# Patient Record
Sex: Male | Born: 1975 | Race: Black or African American | Hispanic: No | Marital: Single | Smoking: Current every day smoker
Health system: Southern US, Community
[De-identification: ages and names within clinical notes are randomized; demographics above are authoritative.]

## PROBLEM LIST (undated history)

## (undated) DIAGNOSIS — F329 Major depressive disorder, single episode, unspecified: Secondary | ICD-10-CM

## (undated) DIAGNOSIS — E669 Obesity, unspecified: Secondary | ICD-10-CM

## (undated) DIAGNOSIS — F209 Schizophrenia, unspecified: Secondary | ICD-10-CM

## (undated) DIAGNOSIS — F32A Depression, unspecified: Secondary | ICD-10-CM

## (undated) DIAGNOSIS — F1010 Alcohol abuse, uncomplicated: Secondary | ICD-10-CM

## (undated) DIAGNOSIS — F1910 Other psychoactive substance abuse, uncomplicated: Secondary | ICD-10-CM

## (undated) DIAGNOSIS — I10 Essential (primary) hypertension: Secondary | ICD-10-CM

---

## 2013-11-17 ENCOUNTER — Emergency Department (HOSPITAL_COMMUNITY)
Admission: EM | Admit: 2013-11-17 | Discharge: 2013-11-18 | Disposition: A | Discharge status: Other Acute Inpatient Hospital | Payer: No Typology Code available for payment source | Attending: Emergency Medicine | Admitting: Emergency Medicine

## 2013-11-17 ENCOUNTER — Encounter (HOSPITAL_COMMUNITY): Payer: Self-pay | Admitting: Emergency Medicine

## 2013-11-17 DIAGNOSIS — F3289 Other specified depressive episodes: Secondary | ICD-10-CM | POA: Insufficient documentation

## 2013-11-17 DIAGNOSIS — R45851 Suicidal ideations: Secondary | ICD-10-CM

## 2013-11-17 DIAGNOSIS — F1010 Alcohol abuse, uncomplicated: Secondary | ICD-10-CM | POA: Insufficient documentation

## 2013-11-17 DIAGNOSIS — F32A Depression, unspecified: Secondary | ICD-10-CM

## 2013-11-17 DIAGNOSIS — Z8659 Personal history of other mental and behavioral disorders: Secondary | ICD-10-CM | POA: Insufficient documentation

## 2013-11-17 DIAGNOSIS — R443 Hallucinations, unspecified: Secondary | ICD-10-CM | POA: Insufficient documentation

## 2013-11-17 DIAGNOSIS — F10929 Alcohol use, unspecified with intoxication, unspecified: Secondary | ICD-10-CM

## 2013-11-17 DIAGNOSIS — F329 Major depressive disorder, single episode, unspecified: Secondary | ICD-10-CM

## 2013-11-17 HISTORY — DX: Depression, unspecified: F32.A

## 2013-11-17 HISTORY — DX: Schizophrenia, unspecified: F20.9

## 2013-11-17 HISTORY — DX: Major depressive disorder, single episode, unspecified: F32.9

## 2013-11-17 LAB — CBC
HCT: 38.6 % — ABNORMAL LOW (ref 39.0–52.0)
Hemoglobin: 13.2 g/dL (ref 13.0–17.0)
MCH: 30 pg (ref 26.0–34.0)
MCHC: 34.2 g/dL (ref 30.0–36.0)
MCV: 87.7 fL (ref 78.0–100.0)
Platelets: 249 K/uL (ref 150–400)
RBC: 4.4 MIL/uL (ref 4.22–5.81)
RDW: 13.9 % (ref 11.5–15.5)
WBC: 8.4 K/uL (ref 4.0–10.5)

## 2013-11-17 LAB — RAPID URINE DRUG SCREEN, HOSP PERFORMED
Amphetamines: NOT DETECTED
Barbiturates: NOT DETECTED
Benzodiazepines: NOT DETECTED
Cocaine: NOT DETECTED
Opiates: NOT DETECTED
Tetrahydrocannabinol: NOT DETECTED

## 2013-11-17 LAB — COMPREHENSIVE METABOLIC PANEL WITH GFR
AST: 23 U/L (ref 0–37)
Albumin: 3.6 g/dL (ref 3.5–5.2)
Alkaline Phosphatase: 71 U/L (ref 39–117)
Chloride: 102 meq/L (ref 96–112)
Total Bilirubin: 0.3 mg/dL (ref 0.3–1.2)

## 2013-11-17 LAB — COMPREHENSIVE METABOLIC PANEL
ALT: 16 U/L (ref 0–53)
BUN: 9 mg/dL (ref 6–23)
CO2: 25 mEq/L (ref 19–32)
Calcium: 9.5 mg/dL (ref 8.4–10.5)
Creatinine, Ser: 0.88 mg/dL (ref 0.50–1.35)
GFR calc Af Amer: >90 mL/min (ref >90)
GFR calc non Af Amer: >90 mL/min (ref >90)
Glucose, Bld: 80 mg/dL (ref 70–99)
Potassium: 4.2 mEq/L (ref 3.7–5.3)
Sodium: 142 mEq/L (ref 137–147)
Total Protein: 7.9 g/dL (ref 6.0–8.3)

## 2013-11-17 LAB — ETHANOL: Alcohol, Ethyl (B): 182 mg/dL — ABNORMAL HIGH (ref 0–11)

## 2013-11-17 MED ORDER — ONDANSETRON HCL 4 MG PO TABS
4.0000 mg | ORAL_TABLET | Freq: Three times a day (TID) | ORAL | Status: DC | PRN
Start: 2013-11-17 — End: 2013-11-18

## 2013-11-17 MED ORDER — IBUPROFEN 400 MG PO TABS: 600.0000 mg | ORAL_TABLET | Freq: Three times a day (TID) | ORAL | Status: DC | PRN

## 2013-11-17 MED ORDER — THIAMINE HCL 100 MG/ML IJ SOLN: 100.0000 mg | Freq: Every day | INTRAMUSCULAR | Status: DC

## 2013-11-17 MED ORDER — LORAZEPAM 1 MG PO TABS: 0.0000 mg | ORAL_TABLET | Freq: Four times a day (QID) | ORAL | Status: DC

## 2013-11-17 MED ORDER — LORAZEPAM 1 MG PO TABS: 0.0000 mg | ORAL_TABLET | Freq: Two times a day (BID) | ORAL | Status: DC

## 2013-11-17 MED ORDER — ACETAMINOPHEN 325 MG PO TABS: 650.0000 mg | ORAL_TABLET | ORAL | Status: DC | PRN

## 2013-11-17 MED ORDER — VITAMIN B-1 100 MG PO TABS: 100.0000 mg | ORAL_TABLET | Freq: Every day | ORAL | Status: DC

## 2013-11-17 NOTE — ED Notes (Signed)
Patient calm, sitter at the bedside.

## 2013-11-17 NOTE — ED Notes (Signed)
Miles CostainFord [facility]

## 2013-11-17 NOTE — BH Assessment (Signed)
Assessment complete. Per Illene SilverBrook, AC at [facility], room [id number] is available. Consulted with Maryjean Mornharles Kober, PA who agreed Pt meets inpatient criteria and accepted Pt to the service of Dr. Jannifer FranklinAkintayo. Notified Dr. Quita SkyeMichael Ghim and Renae FicklePaul, RN of acceptance.  Harlin RainFord Ellis Ria CommentWarrick Jr, LPC, [facility] Triage Specialist

## 2013-11-17 NOTE — Consult Note (Signed)
  Called by TTS pt schizophrenic  And polysubstance abuser (alcohol/cocaine) Off meds 2 yrs-now intoxicated with AH directing self harm.Claims trigger recent deaht of grandmother-bed on [address] available Orders done
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
## 2013-11-17 NOTE — ED Notes (Signed)
[facility]
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
## 2013-11-17 NOTE — ED Notes (Signed)
Patient states Grandmother passed away November 04 2013 and since then patient depressed not eating well, not sleeping well due to "wierd dreams" and is SI no plan denies HI.

## 2013-11-17 NOTE — BH Assessment (Signed)
Received call for assessment. Spoke with Quita SkyeMichael Ghim, MD who said Pt is intoxicated and Pt reports he has schizophrenia. Pt states he has been depressed since grandmother died two weeks ago and has been hearing voices telling him to harm himself. He has been abusing alcohol and drugs. Tele-assessment will be initiated when tele-med cart is available.  Harlin RainFord Ellis Ria CommentWarrick Jr, LPC, [facility] Triage Specialist

## 2013-11-18 ENCOUNTER — Inpatient Hospital Stay (HOSPITAL_COMMUNITY)
Admission: AD | Admit: 2013-11-18 | Discharge: 2013-11-22 | DRG: 885 | Disposition: A | Discharge status: Home / Self Care | Payer: Federal, State, Local not specified - Other | Attending: Psychiatry | Admitting: Psychiatry

## 2013-11-18 ENCOUNTER — Encounter (HOSPITAL_COMMUNITY): Payer: Self-pay

## 2013-11-18 DIAGNOSIS — Z23 Encounter for immunization: Secondary | ICD-10-CM

## 2013-11-18 DIAGNOSIS — F3289 Other specified depressive episodes: Secondary | ICD-10-CM | POA: Diagnosis present

## 2013-11-18 DIAGNOSIS — R45851 Suicidal ideations: Secondary | ICD-10-CM

## 2013-11-18 DIAGNOSIS — F1210 Cannabis abuse, uncomplicated: Secondary | ICD-10-CM | POA: Diagnosis present

## 2013-11-18 DIAGNOSIS — F205 Residual schizophrenia: Principal | ICD-10-CM | POA: Diagnosis present

## 2013-11-18 DIAGNOSIS — F1020 Alcohol dependence, uncomplicated: Secondary | ICD-10-CM | POA: Diagnosis present

## 2013-11-18 DIAGNOSIS — F411 Generalized anxiety disorder: Secondary | ICD-10-CM | POA: Diagnosis present

## 2013-11-18 DIAGNOSIS — F329 Major depressive disorder, single episode, unspecified: Secondary | ICD-10-CM | POA: Diagnosis present

## 2013-11-18 DIAGNOSIS — G4700 Insomnia, unspecified: Secondary | ICD-10-CM | POA: Diagnosis present

## 2013-11-18 MED ORDER — ACETAMINOPHEN 325 MG PO TABS
650.0000 mg | ORAL_TABLET | ORAL | Status: DC | PRN
  Administered 2013-11-20: 650 mg via ORAL
  Filled 2013-11-18: qty 2

## 2013-11-18 MED ORDER — ONDANSETRON 4 MG PO TBDP: 4.0000 mg | ORAL_TABLET | Freq: Four times a day (QID) | ORAL | Status: AC | PRN

## 2013-11-18 MED ORDER — PNEUMOCOCCAL VAC POLYVALENT 25 MCG/0.5ML IJ INJ: 0.5000 mL | INJECTION | INTRAMUSCULAR | Status: DC

## 2013-11-18 MED ORDER — VITAMIN B-1 100 MG PO TABS
100.0000 mg | ORAL_TABLET | Freq: Every day | ORAL | Status: DC
  Administered 2013-11-19 – 2013-11-22 (×4): 100 mg via ORAL
  Filled 2013-11-18 (×6): qty 1

## 2013-11-18 MED ORDER — CHLORDIAZEPOXIDE HCL 25 MG PO CAPS
25.0000 mg | ORAL_CAPSULE | Freq: Four times a day (QID) | ORAL | Status: AC
  Administered 2013-11-18 – 2013-11-19 (×5): 25 mg via ORAL
  Filled 2013-11-18 (×5): qty 1

## 2013-11-18 MED ORDER — THIAMINE HCL 100 MG/ML IJ SOLN: 100.0000 mg | Freq: Once | INTRAMUSCULAR | Status: DC

## 2013-11-18 MED ORDER — CHLORDIAZEPOXIDE HCL 25 MG PO CAPS: 25.0000 mg | ORAL_CAPSULE | Freq: Every day | ORAL | Status: DC

## 2013-11-18 MED ORDER — LOPERAMIDE HCL 2 MG PO CAPS: 2.0000 mg | ORAL_CAPSULE | ORAL | Status: AC | PRN

## 2013-11-18 MED ORDER — IBUPROFEN 200 MG PO TABS
600.0000 mg | ORAL_TABLET | Freq: Three times a day (TID) | ORAL | Status: DC | PRN
  Filled 2013-11-18: qty 3

## 2013-11-18 MED ORDER — MAGNESIUM HYDROXIDE 400 MG/5ML PO SUSP: 30.0000 mL | Freq: Every day | ORAL | Status: DC | PRN

## 2013-11-18 MED ORDER — CHLORDIAZEPOXIDE HCL 25 MG PO CAPS: 25.0000 mg | ORAL_CAPSULE | Freq: Once | ORAL | Status: DC

## 2013-11-18 MED ORDER — ADULT MULTIVITAMIN W/MINERALS CH
1.0000 | ORAL_TABLET | Freq: Every day | ORAL | Status: DC
  Administered 2013-11-18 – 2013-11-22 (×5): 1 via ORAL
  Filled 2013-11-18 (×7): qty 1

## 2013-11-18 MED ORDER — ALUM & MAG HYDROXIDE-SIMETH 200-200-20 MG/5ML PO SUSP: 30.0000 mL | ORAL | Status: DC | PRN

## 2013-11-18 MED ORDER — CHLORDIAZEPOXIDE HCL 25 MG PO CAPS: 25.0000 mg | ORAL_CAPSULE | Freq: Three times a day (TID) | ORAL | Status: DC

## 2013-11-18 MED ORDER — CHLORDIAZEPOXIDE HCL 25 MG PO CAPS: 25.0000 mg | ORAL_CAPSULE | ORAL | Status: DC

## 2013-11-18 MED ORDER — HYDROXYZINE HCL 25 MG PO TABS
25.0000 mg | ORAL_TABLET | Freq: Four times a day (QID) | ORAL | Status: DC | PRN
Start: 2013-11-18 — End: 2013-11-20
  Administered 2013-11-18: 25 mg via ORAL
  Filled 2013-11-18: qty 1

## 2013-11-18 MED ORDER — CHLORDIAZEPOXIDE HCL 25 MG PO CAPS: 25.0000 mg | ORAL_CAPSULE | Freq: Four times a day (QID) | ORAL | Status: AC | PRN

## 2013-11-18 MED ORDER — INFLUENZA VAC SPLIT QUAD 0.5 ML IM SUSP
0.5000 mL | INTRAMUSCULAR | Status: DC
  Filled 2013-11-18: qty 0.5

## 2013-11-18 NOTE — [facility] Group Notes (Signed)
[facility] LCSW Group Therapy  11/18/2013 1:35 PM  Type of Therapy:  Group Therapy  Participation Level:  None  Participation Quality:  Drowsy  Affect:  Lethargic  Cognitive:  Lacking  Insight:  None  Engagement in Therapy:  None  Modes of Intervention:  Confrontation, Discussion, Education, Exploration, Problem-solving, Rapport Building, Socialization and Support  Summary of Progress/Problems: Today's group topic was avoiding self sabotage and enabling behaviors. Group members were asked to define self sabotage and enabling and provide examples. Group members were then asked to discuss unhealthy relationships and how to have positive healthy boundaries with those that enable. Group members were asked to process how communicating needs and establishing a plan to change the above identified behavior. Bruce [name] came to group about thirty minutes late. He sat quietly in his seat but was inattentive and disengaged throughout group. He did not actively participate in group discussion and does not appear to be making progress in the group setting at this time.    Smart, [name] LCSWA  11/18/2013, 1:35 PM

## 2013-11-18 NOTE — [facility] Suicide Risk Assessment (Signed)
Suicide Risk Assessment  Admission Assessment     Nursing information obtained from:  Patient Demographic factors:  Male;Low socioeconomic status Current Mental Status:  Suicidal ideation indicated by patient Loss Factors:  Loss of significant relationship;Financial problems / change in socioeconomic status Historical Factors:  Prior suicide attempts;Family history of mental illness or substance abuse Risk Reduction Factors:  Living with another person, especially a relative;Positive social support Total Time spent with patient: 45 minutes  CLINICAL FACTORS:   Schizophrenia:   Command hallucinatons Depressive state Less than 919 years old Paranoid or undifferentiated type  Psychiatric Specialty Exam: Please see complete mental status examination is done by this writer on history and physical.  COGNITIVE FEATURES THAT CONTRIBUTE TO RISK:  Closed-mindedness Loss of executive function Polarized thinking Thought constriction (tunnel vision)    SUICIDE RISK:   Severe:  Frequent, intense, and enduring suicidal ideation, specific plan, no subjective intent, but some objective markers of intent (i.e., choice of lethal method), the method is accessible, some limited preparatory behavior, evidence of impaired self-control, severe dysphoria/symptomatology, multiple risk factors present, and few if any protective factors, particularly a lack of social support.  PLAN OF CARE:  I certify that inpatient services furnished can reasonably be expected to improve the patient's condition.  ARFEEN,SYED T. 11/18/2013, 10:50 AM

## 2013-11-18 NOTE — Progress Notes (Signed)
NUTRITION ASSESSMENT  Consult -"obese, metabolic syndrome, mental illness"  INTERVENTION: 1. Educated patient on the importance of nutrition and encouraged intake of food and beverages.  Educated patient on healthy eating for weight loss.  Encouraged him to stay active.  Patient with no questions. 2. Discussed weight goals. 3. Supplements: MVI and thiamine   NUTRITION DIAGNOSIS: Unintentional weight loss related to sub-optimal intake as evidenced by pt report.   Goal: Pt to meet >/= 90% of their estimated nutrition needs.  Monitor:  PO intake  Assessment:  Patient admitted with depression and auditory hallucinations- probably untreated schizophrenia.  Hx of ETOH, THC, and Cocaine.    Patient states that he drinks up to a 12 pack of beer every day.  Otherwise drinks water.  Was not open with specific details of his diet.  States that he has always struggled with his weight but it increases and decreases.  Loses weight when he works more.  Job is very active.  38 y.o. male  Height: Ht Readings from Last 1 Encounters:  11/18/13 5\' 6"  (1.676 m)    Weight: Wt Readings from Last 1 Encounters:  11/18/13 380 lb (172.367 kg)    Weight Hx: Wt Readings from Last 10 Encounters:  11/18/13 380 lb (172.367 kg)  11/17/13 350 lb (158.759 kg)    BMI:  Body mass index is 61.36 kg/(m^2). Pt meets criteria for extreme obesity based on current BMI.  Estimated Nutritional Needs: Kcal: 25-30 kcal/kg Protein: > 1 gram protein/kg Fluid: 1 ml/kcal  Diet Order: General Pt is also offered choice of unit snacks mid-morning and mid-afternoon.  Pt is eating as desired.   Lab results and medications reviewed.   Oran ReinLaura Jobe, RD, LDN Clinical Inpatient Dietitian Pager:  [phone] Weekend and after hours pager:  [phone]

## 2013-11-18 NOTE — Progress Notes (Signed)
Patient ID: Bruce [name], male   DOB: [birth year][name], 38 y.o.   MRN: [id number] Pt is a 38 yr old male that came in for increased depression and auditory hallucinations. Pt stated he hears a mans voice telling him to hurt himself. Pt stated it started shortly after his grandmother passed which was approximately two week ago. Pt currently lives with a friend and works with a Customer service managertemporary service. Pt stated he drinks 3 40oz daily or a half case of beer. Pt has hx of THC and crack cocaine use. Pt stated his last use of THC was about two months ago. Pt stated he is having passive si, but contracts for safety. Pt has had previous SI attempts, with cutting self and attempting to jump off of bridge. Pt stated his uncle suffers from schizophrenia and bipolar, so he is able to talk to him because he feels as though he understands. Pt stated his support system consist of his mother and uncle. Pt is calm and cooperative. Speaks softly, brief eye contact. Denies hi and pain. Pt introduced to unit. Meal given.pt remains safe on unit.

## 2013-11-18 NOTE — [facility] Group Notes (Signed)
[facility] Group Notes:  (Nursing/MHT/Case Management/Adjunct)  Date:  11/18/2013  Time:  0910  Type of Therapy:  Psychoeducational Skills--self inventory review  Participation Level:  Minimal  Participation Quality:  Resistant  Affect:  Irritable  Cognitive:  Lacking  Insight:  Lacking  Engagement in Group:  Limited  Modes of Intervention:  Discussion, Education and Exploration  Summary of Progress/Problems: Self inventory review with RN.  Malva LimesStrader, Alexander Aument 11/18/2013, 1:01 PM

## 2013-11-18 NOTE — Progress Notes (Signed)
Patient ID: Bruce OrganDwight [name], male   DOB: 1975/10/19, 38 y.o.   MRN: [id number] D. Patient presents with depressed mood, affect congruent. Bruce [name] reports that his mood is improving and he denies any SI/HI. He has denied any withdrawal symptoms. Po fluids encouraged and gatorade provided. Bruce [name] has been interactive on the unit and attending unit programming. He denies any acute concerns. A. Support and encouragement provided . Medications given as ordered. R. Patient denies any acute concerns at this time. Will continue to monitor q 15 minutes for safety.

## 2013-11-18 NOTE — Tx Team (Signed)
Initial Interdisciplinary Treatment Plan  PATIENT STRENGTHS: (choose at least two) Ability for insight Communication skills Motivation for treatment/growth Supportive family/friends  PATIENT STRESSORS: Financial difficulties Medication change or noncompliance Substance abuse   PROBLEM LIST: Problem List/Patient Goals Date to be addressed Date deferred Reason deferred Estimated date of resolution  depression 11/18/13     etoh and drug use 11/18/13     auditory hallucination  11/18/13     Poor coping skills 11/18/13                                    DISCHARGE CRITERIA:  Ability to meet basic life and health needs Adequate post-discharge living arrangements Improved stabilization in mood, thinking, and/or behavior Motivation to continue treatment in a less acute level of care  PRELIMINARY DISCHARGE PLAN: Attend aftercare/continuing care group Attend PHP/IOP Outpatient therapy Return to previous living arrangement  PATIENT/FAMIILY INVOLVEMENT: This treatment plan has been presented to and reviewed with the patient, Bruce [name].  The patient and family have been given the opportunity to ask questions and make suggestions.  Heriberto Antiguaerry, Trew Sunde M 11/18/2013, 1:43 AM
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
## 2013-11-18 NOTE — [facility] Group Notes (Signed)
[facility] Group Notes:  (Nursing/MHT/Case Management/Adjunct)  Date:  11/18/2013  Time:  0910  Type of Therapy:  Psychoeducational Skills--Healthy Support Systems  Participation Level:  Active  Participation Quality:  Appropriate  Affect:   Depressed  Cognitive:  Appropriate  Insight:  Improving  Engagement in Group:  Engaged  Modes of Intervention:  Discussion, Education and Exploration  Summary of Progress/Problems: Psychoeducational review with a focus on healthy support systems. During this group we discussed unhealthy support and triggers, and to replace with healthier ones for mental health recovery.  Malva LimesStrader, Ascencion Stegner 11/18/2013, 1:34 PM

## 2013-11-19 MED ORDER — RISPERIDONE 1 MG PO TABS
1.0000 mg | ORAL_TABLET | Freq: Every day | ORAL | Status: DC
  Administered 2013-11-19 – 2013-11-21 (×3): 1 mg via ORAL
  Filled 2013-11-19: qty 14
  Filled 2013-11-19 (×4): qty 1

## 2013-11-19 MED ORDER — FLUOXETINE HCL 20 MG PO CAPS
20.0000 mg | ORAL_CAPSULE | Freq: Every day | ORAL | Status: DC
  Administered 2013-11-19 – 2013-11-22 (×4): 20 mg via ORAL
  Filled 2013-11-19 (×2): qty 1
  Filled 2013-11-19: qty 14
  Filled 2013-11-19 (×4): qty 1

## 2013-11-19 NOTE — Progress Notes (Signed)
The focus of this group is to help patients review their daily goal of treatment and discuss progress on daily workbooks. Pt attended the evening group session and responded to all discussion prompts from the Writer. Pt shared that today was a good day on the unit, the highlight of which was having a good conversation with his Mom on the phone, whom he considers supportive. Pt stated that he was feeling less stressed today than when he came in. On the topic of goals, Pt stated that he hoped to be well enough to return to work this week. Pt's affect was appropriate.

## 2013-11-19 NOTE — Progress Notes (Signed)
Adult Psychoeducational Group Note  Date:  11/19/2013 Time:  10:00 am  Group Topic/Focus:  Wellness Toolbox:   The focus of this group is to discuss various aspects of wellness, balancing those aspects and exploring ways to increase the ability to experience wellness.  Patients will create a wellness toolbox for use upon discharge.  Participation Level:  Active  Participation Quality:  Appropriate and Sharing  Affect:  Appropriate  Cognitive:  Appropriate  Insight: Appropriate  Engagement in Group:  Engaged  Modes of Intervention:  Discussion, Education, Socialization and Support  Additional Comments:  Pt stated that by finding permanent work and staying busy that he can promote wellness. Pt stated that his criminal record along with alcohol are barriers to his progress.    Renise Gillies 11/19/2013, 10:54 AM

## 2013-11-19 NOTE — [facility] Group Notes (Signed)
[facility]   11/19/2013 10:27 AM  Participation Quality:  Engaged  Mood/Affect:  Depressed  Depression Rating:  unrated  Anxiety Rating:  unrated  Thoughts of Suicide:  No Will you contract for safety?   NA  Current AVH:  No  Plan for Discharge/Comments:  [name] states a friend suggested he come in as he had been increasingly depressed and increased his drinking following his grandmother's death earlier this month.  He lives with same friend.  He is temporarily employed with the furniture market. His supports are his uncle and his mother.  He wonders how long he will have to be here.  Transportation Means:  family  Supports: family   Kiribatiorth, [name] B

## 2013-11-19 NOTE — [facility] Group Notes (Signed)
[facility] LCSW Group Therapy  11/19/2013 1:15 pm  Type of Therapy: Process Group Therapy  Participation Level:  Active  Participation Quality:  Minimal  Affect:  Drowsey  Cognitive:  Oriented  Insight:  Improving  Engagement in Group:  Limited  Engagement in Therapy:  Limited  Modes of Intervention:  Activity, Clarification, Education, Problem-solving and Support  Summary of Progress/Problems: Today's group addressed the issue of overcoming obstacles.  Patients were asked to identify their biggest obstacle post d/c that stands in the way of their on-going success, and then problem solve as to how to manage this.   I awoke Bruce [name] to ask him about stressors.  He stated he needs to cut back on his drinking when he leaves us.  Did not have a plan for this other than trying to pick up more hours at work.  Talked about an uncle who is also struggling with alcohol who ended up in the hospital recently.  He believes the 2 of them can be supportive to each other.  Bruce [name], Bruce [name] 11/19/2013   3:47 PM

## 2013-11-19 NOTE — Progress Notes (Signed)
[facility] MD Progress Note  11/19/2013 11:09 AM Bruce [name]  MRN:  [id number] Subjective:   Patient states "I got real depressed after my grandmother died. I started to hear voices again. It has happened before. Then I started to drink daily to cope with my symptoms. I have not been taking any medications lately."  Objective:  Patient is visible on the unit and attending the scheduled groups. He reports decreased symptoms of depression and psychosis since being admitted to the hospital. So far patient has only been prescribed the librium detox protocol. Nursing staff report that the patient has been minimizing his symptoms since admission. Prior to admission the patient was experiencing depression, command hallucinations, suicidal thoughts, and paranoia. He admits to being severely depressed about the recent death of his grandmother on 11-29-13. 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 The patient admits to a history of schizophrenia but denies being on antipsychotic medications in the past. He is reporting minimal withdrawal symptoms reporting that he was drinking heavily for the past two weeks. Patient is open to being started on medications to treat his symptoms.   Diagnosis:   DSM5: Total Time spent with patient: 30 minutes  Schizophrenia Disorders: Schizophrenia (295.7)  Obsessive-Compulsive Disorders: None  Trauma-Stressor Disorders: None  Substance/Addictive Disorders: History of using crack cocaine and alcohol.  AXIS I: Paranoid schizophrenia  AXIS II: Deferred  AXIS III:  Past Medical History   Diagnosis  Date   .  Depression    .  Schizophrenia     AXIS IV: other psychosocial or environmental problems, problems related to social environment and problems with primary support group  AXIS V: 41-50 Serious Symptoms   ADL's:  Intact  Sleep: Fair  Appetite:  Good  Suicidal Ideation:  Passive SI no plan  Homicidal  Ideation:  Denies AEB (as evidenced by):  Psychiatric Specialty Exam: Physical Exam  Review of Systems  Constitutional: Negative.   HENT: Negative.   Eyes: Negative.   Respiratory: Negative.   Cardiovascular: Negative.   Gastrointestinal: Negative.   Genitourinary: Negative.   Musculoskeletal: Negative.   Skin: Negative.   Neurological: Negative.   Endo/Heme/Allergies: Negative.   Psychiatric/Behavioral: Positive for depression, suicidal ideas, hallucinations and substance abuse. Negative for memory loss. The patient is nervous/anxious and has insomnia.     Blood pressure 157/86, pulse 83, temperature 97.4 F (36.3 C), temperature source Oral, resp. rate 16, height '5\' 6"'  (1.676 m), weight 172.367 kg (380 lb).Body mass index is 61.36 kg/(m^2).  General Appearance: Casual  Eye Contact::  Fair  Speech:  Clear and Coherent and Slow  Volume:  Normal  Mood:  Dysphoric  Affect:  Blunt  Thought Process:  Circumstantial  Orientation:  Full (Time, Place, and Person)  Thought Content:  Hallucinations: Auditory and Paranoid Ideation  Suicidal Thoughts:  Yes.  without intent/plan  Homicidal Thoughts:  No  Memory:  Immediate;   Good Recent;   Fair Remote;   Fair  Judgement:  Impaired  Insight:  Lacking  Psychomotor Activity:  Decreased  Concentration:  Fair  Recall:  Independence: Fair  Akathisia:  No  Handed:  Right  AIMS (if indicated):     Assets:  Communication Skills Desire for Improvement Leisure Time Physical Health Resilience Social Support  Sleep:  Number of Hours: 6.5   Musculoskeletal: Strength & Muscle Tone: within normal limits Gait & Station: normal Patient leans: N/A  Current  Medications: Current Facility-Administered Medications  Medication Dose Route Frequency Provider Last Rate Last Dose  . acetaminophen (TYLENOL) tablet 650 mg  650 mg Oral Q4H PRN Dara Hoyer, PA-C      . alum & mag hydroxide-simeth (MAALOX/MYLANTA)  200-200-20 MG/5ML suspension 30 mL  30 mL Oral Q4H PRN Dara Hoyer, PA-C      . chlordiazePOXIDE (LIBRIUM) capsule 25 mg  25 mg Oral Q6H PRN Dara Hoyer, PA-C      . chlordiazePOXIDE (LIBRIUM) capsule 25 mg  25 mg Oral Once Dara Hoyer, PA-C      . chlordiazePOXIDE (LIBRIUM) capsule 25 mg  25 mg Oral QID Dara Hoyer, PA-C   25 mg at 11/19/13 3818   Followed by  . chlordiazePOXIDE (LIBRIUM) capsule 25 mg  25 mg Oral TID Dara Hoyer, PA-C       Followed by  . [START ON 11/20/2013] chlordiazePOXIDE (LIBRIUM) capsule 25 mg  25 mg Oral BH-qamhs Dara Hoyer, PA-C       Followed by  . [START ON 11/22/2013] chlordiazePOXIDE (LIBRIUM) capsule 25 mg  25 mg Oral Daily Dara Hoyer, PA-C      . FLUoxetine (PROZAC) capsule 20 mg  20 mg Oral Daily Elmarie Shiley, NP      . hydrOXYzine (ATARAX/VISTARIL) tablet 25 mg  25 mg Oral Q6H PRN Dara Hoyer, PA-C   25 mg at 11/18/13 0813  . ibuprofen (ADVIL,MOTRIN) tablet 600 mg  600 mg Oral Q8H PRN Dara Hoyer, PA-C      . influenza vac split quadrivalent PF (FLUARIX) injection 0.5 mL  0.5 mL Intramuscular Tomorrow-1000 Gerasimos Plotts      . loperamide (IMODIUM) capsule 2-4 mg  2-4 mg Oral PRN Dara Hoyer, PA-C      . magnesium hydroxide (MILK OF MAGNESIA) suspension 30 mL  30 mL Oral Daily PRN Dara Hoyer, PA-C      . multivitamin with minerals tablet 1 tablet  1 tablet Oral Daily Dara Hoyer, PA-C   1 tablet at 11/19/13 0803  . ondansetron (ZOFRAN-ODT) disintegrating tablet 4 mg  4 mg Oral Q6H PRN Dara Hoyer, PA-C      . pneumococcal 23 valent vaccine (PNU-IMMUNE) injection 0.5 mL  0.5 mL Intramuscular Tomorrow-1000 Laquashia Mergenthaler      . risperiDONE (RISPERDAL) tablet 1 mg  1 mg Oral QHS Elmarie Shiley, NP      . thiamine (B-1) injection 100 mg  100 mg Intramuscular Once Dara Hoyer, PA-C      . thiamine (VITAMIN B-1) tablet 100 mg  100 mg Oral Daily Dara Hoyer, PA-C   100 mg at 11/19/13 0803    Lab Results:   Results for orders placed during the hospital encounter of 11/17/13 (from the past 48 hour(s))  COMPREHENSIVE METABOLIC PANEL     Status: None   Collection Time    11/17/13  7:43 PM      Result Value Ref Range   Sodium 142  137 - 147 mEq/L   Potassium 4.2  3.7 - 5.3 mEq/L   Chloride 102  96 - 112 mEq/L   CO2 25  19 - 32 mEq/L   Glucose, Bld 80  70 - 99 mg/dL   BUN 9  6 - 23 mg/dL   Creatinine, Ser 0.88  0.50 - 1.35 mg/dL   Calcium 9.5  8.4 - 10.5 mg/dL   Total Protein 7.9  6.0 - 8.3 g/dL  Albumin 3.6  3.5 - 5.2 g/dL   AST 23  0 - 37 U/L   ALT 16  0 - 53 U/L   Alkaline Phosphatase 71  39 - 117 U/L   Total Bilirubin 0.3  0.3 - 1.2 mg/dL   GFR calc non Af Amer >90  >90 mL/min   GFR calc Af Amer >90  >90 mL/min   Comment: (NOTE)     The eGFR has been calculated using the CKD EPI equation.     This calculation has not been validated in all clinical situations.     eGFR's persistently <90 mL/min signify possible Chronic Kidney     Disease.  ETHANOL     Status: Abnormal   Collection Time    11/17/13  7:43 PM      Result Value Ref Range   Alcohol, Ethyl (B) 182 (*) 0 - 11 mg/dL   Comment:            LOWEST DETECTABLE LIMIT FOR     SERUM ALCOHOL IS 11 mg/dL     FOR MEDICAL PURPOSES ONLY  CBC     Status: Abnormal   Collection Time    11/17/13  7:43 PM      Result Value Ref Range   WBC 8.4  4.0 - 10.5 K/uL   RBC 4.40  4.22 - 5.81 MIL/uL   Hemoglobin 13.2  13.0 - 17.0 g/dL   HCT 38.6 (*) 39.0 - 52.0 %   MCV 87.7  78.0 - 100.0 fL   MCH 30.0  26.0 - 34.0 pg   MCHC 34.2  30.0 - 36.0 g/dL   RDW 13.9  11.5 - 15.5 %   Platelets 249  150 - 400 K/uL  URINE RAPID DRUG SCREEN (HOSP PERFORMED)     Status: None   Collection Time    11/17/13  8:29 PM      Result Value Ref Range   Opiates NONE DETECTED  NONE DETECTED   Cocaine NONE DETECTED  NONE DETECTED   Benzodiazepines NONE DETECTED  NONE DETECTED   Amphetamines NONE DETECTED  NONE DETECTED   Tetrahydrocannabinol NONE DETECTED   NONE DETECTED   Barbiturates NONE DETECTED  NONE DETECTED   Comment:            DRUG SCREEN FOR MEDICAL PURPOSES     ONLY.  IF CONFIRMATION IS NEEDED     FOR ANY PURPOSE, NOTIFY LAB     WITHIN 5 DAYS.                LOWEST DETECTABLE LIMITS     FOR URINE DRUG SCREEN     Drug Class       Cutoff (ng/mL)     Amphetamine      1000     Barbiturate      200     Benzodiazepine   465     Tricyclics       035     Opiates          300     Cocaine          300     THC              50    Physical Findings: AIMS:  , ,  ,  ,    CIWA:  CIWA-Ar Total: 0 COWS:     Treatment Plan Summary: Daily contact with patient to assess and evaluate symptoms and progress in treatment Medication management  Plan: 1. Continue crisis management and stabilization.  2. Medication management: Continue Librium protocol for alcohol detox. Start Prozac 20 mg daily for depression and Risperdal 1 mg hs for psychosis.  3. Encouraged patient to attend groups and participate in group counseling sessions and activities.  4. Discharge plan in progress.  5. Continue current treatment plan.  6. Address health issues: Monitor blood pressure for continued elevations.   Medical Decision Making Problem Points:  Established problem, stable/improving (1), Review of last therapy session (1) and Review of psycho-social stressors (1) Data Points:  Review of medication regiment & side effects (2) Review of new medications or change in dosage (2)  I certify that inpatient services furnished can reasonably be expected to improve the patient's condition.   Elmarie Shiley NP-C 11/19/2013, 11:09 AM Patient seen, evaluated and I agree with notes by Nurse Practitioner. Corena Pilgrim, MD

## 2013-11-19 NOTE — Progress Notes (Signed)
D: Pt presents with flat affect and depressed mood. Pt appears to be minimizing his symptoms this morning. Pt denies feeling SI/HI/AVH. Pt denies having any depression today. Pt denies having any withdrawal symptoms and verbalized that the librium is causing him to feel drowsy. Pt have not been prescribed any antidepressants nor antipsychotics however, pt reports that he is feeling better today. A: Medications administered as ordered per MD. Verbal support given. Pt encouraged to attend groups. 15 minute checks performed for safety. R: Pt safety maintained at this time.

## 2013-11-19 NOTE — Progress Notes (Signed)
[facility] Group Notes:  (Nursing/MHT/Case Management/Adjunct)  Date:  11/18/2013 Time:  8:00 p.m.   Type of Therapy:  Psychoeducational Skills  Participation Level:  Minimal  Participation Quality:  Resistant  Affect:  Depressed  Cognitive:  Lacking  Insight:  Lacking  Engagement in Group:  Resistant  Modes of Intervention:  Education  Summary of Progress/Problems: The patient had very little to share in group this evening. He stated that he had a good day and that he attended groups. The patient would not explain any further. The patient's support system (theme of the day) will consist of his mother and uncle.   Reve Crocket S 11/19/2013, 1:52 AM

## 2013-11-20 DIAGNOSIS — R45851 Suicidal ideations: Secondary | ICD-10-CM

## 2013-11-20 DIAGNOSIS — F200 Paranoid schizophrenia: Secondary | ICD-10-CM

## 2013-11-20 DIAGNOSIS — F1020 Alcohol dependence, uncomplicated: Secondary | ICD-10-CM | POA: Diagnosis present

## 2013-11-20 MED ORDER — OLANZAPINE 10 MG PO TBDP: 10.0000 mg | ORAL_TABLET | Freq: Three times a day (TID) | ORAL | Status: DC | PRN

## 2013-11-20 MED ORDER — TRAZODONE HCL 100 MG PO TABS
100.0000 mg | ORAL_TABLET | Freq: Every evening | ORAL | Status: DC | PRN
  Administered 2013-11-20: 100 mg via ORAL
  Filled 2013-11-20: qty 14

## 2013-11-20 NOTE — Progress Notes (Signed)
D: Pt denies SI/HI/AVH. Pt is pleasant and cooperative. Pt stated he was ready to get back to work  A: Pt was offered support and encouragement. Pt was given scheduled medications. Pt was encourage to attend groups. Q 15 minute checks were done for safety.   R:Pt attends groups and interacts well with peers and staff. Pt is taking medication. Pt has no complaints at this time .Pt receptive to treatment and safety maintained on unit. 

## 2013-11-20 NOTE — Tx Team (Signed)
  Interdisciplinary Treatment Plan Update   Date Reviewed:  11/20/2013  Time Reviewed:  8:31 AM  Progress in Treatment:   Attending groups: Yes Participating in groups: Yes Taking medication as prescribed: Yes  Tolerating medication: Yes Family/Significant other contact made: Yes  Patient understands diagnosis: Yes  Discussing patient identified problems/goals with staff: Yes Medical problems stabilized or resolved: Yes Denies suicidal/homicidal ideation: Yes Patient has not harmed self or others: Yes  For review of initial/current patient goals, please see plan of care.  Estimated Length of Stay:  3-5 days  Reason for Continuation of Hospitalization: Depression Hallucinations Medication stabilization  New Problems/Goals identified:  N/A  Discharge Plan or Barriers:   return home, follow up outpt  Additional Comments:  "I got real depressed after my grandmother died. I started to hear voices again. It has happened before. Then I started to drink daily to cope with my symptoms. I have not been taking any medications lately."   Attendees:  Signature: Thedore MinsMojeed Akintayo, MD 11/20/2013 8:31 AM   Signature: Richelle Itood Felecia Stanfill, LCSW 11/20/2013 8:31 AM  Signature: Fransisca KaufmannLaura Davis, NP 11/20/2013 8:31 AM  Signature: Joslyn Devonaroline Beaudry, RN 11/20/2013 8:31 AM  Signature: Liborio NixonPatrice White, RN 11/20/2013 8:31 AM  Signature:  11/20/2013 8:31 AM  Signature:   11/20/2013 8:31 AM  Signature:    Signature:    Signature:    Signature:    Signature:    Signature:      Scribe for Treatment Team:   Richelle Itood Rufina Kimery, LCSW  11/20/2013 8:31 AM

## 2013-11-20 NOTE — [facility] Group Notes (Signed)
[facility] LCSW Group Therapy  11/20/2013 , 1:28 PM   Type of Therapy:  Group Therapy  Participation Level:  Stated he had a headache and could not Attend    Summary of Progress/Problems: Today's group focused on the term Diagnosis.  Participants were asked to define the term, and then pronounce whether it is a negative, positive or neutral term.  Daryel Geraldorth, Kaidence Callaway B 11/20/2013 , 1:28 PM

## 2013-11-20 NOTE — Progress Notes (Signed)
Patient ID: Bruce [name], male   DOB: 06-Dec-1975, 38 y.o.   MRN: [id number] [facility] MD Progress Note  11/20/2013 11:22 AM Bruce [name]  MRN:  [id number] Subjective:   Patient states "I am still feeling depressed and hears voices.'' Objective:  Patient seen and chart is reviewed. Patient continue to endorse feeling anxious and depressed. He endorse recent stressor because his grandmother died over two weeks ago and it was a funeral which led to increased hallucination. He is reporting ongoing crying spells, poor concentration and difficulty sleeping. He has been feeling  Hopeless, helpless and worthless. He also feels that people are against him and they are going to hurt him. He is withdrawn into his room with minimal interaction with his peers. He has not been attending the unit milieu but he is compliant with his medications.  Diagnosis:   DSM5: Total Time spent with patient: 20 minutes  Schizophrenia Disorders: Schizophrenia (295.7)  Obsessive-Compulsive Disorders: None  Trauma-Stressor Disorders: None  Substance/Addictive Disorders: History of using crack cocaine and alcohol.  AXIS I: Paranoid schizophrenia  AXIS II: Deferred  AXIS III:  Past Medical History   Diagnosis  Date   .  Depression    .  Schizophrenia     AXIS IV: other psychosocial or environmental problems, problems related to social environment and problems with primary support group  AXIS V: 41-50 Serious Symptoms   ADL's:  Intact  Sleep: Fair  Appetite:  Good  Suicidal Ideation:  Passive SI no plan  Homicidal Ideation:  Denies AEB (as evidenced by):  Psychiatric Specialty Exam: Physical Exam  Review of Systems  Constitutional: Negative.   HENT: Negative.   Eyes: Negative.   Respiratory: Negative.   Cardiovascular: Negative.   Gastrointestinal: Negative.   Genitourinary: Negative.   Musculoskeletal: Negative.   Skin: Negative.   Neurological: Negative.   Endo/Heme/Allergies: Negative.    Psychiatric/Behavioral: Positive for depression, suicidal ideas, hallucinations and substance abuse. Negative for memory loss. The patient is nervous/anxious and has insomnia.     Blood pressure 123/83, pulse 81, temperature 97.7 F (36.5 C), temperature source Oral, resp. rate 20, height 5\' 6"  (1.676 m), weight 172.367 kg (380 lb).Body mass index is 61.36 kg/(m^2).  General Appearance: Casual  Eye Contact::  Fair  Speech:  Clear and Coherent and Slow  Volume:  Normal  Mood:  Dysphoric  Affect:  Blunt  Thought Process:  Circumstantial  Orientation:  Full (Time, Place, and Person)  Thought Content:  Hallucinations: Auditory and Paranoid Ideation  Suicidal Thoughts:  Yes.  without intent/plan  Homicidal Thoughts:  No  Memory:  Immediate;   Good Recent;   Fair Remote;   Fair  Judgement:  Impaired  Insight:  Lacking  Psychomotor Activity:  Decreased  Concentration:  Fair  Recall:  [employer] of Knowledge:Fair  Language: Fair  Akathisia:  No  Handed:  Right  AIMS (if indicated):     Assets:  Communication Skills Desire for Improvement Leisure Time Physical Health Resilience Social Support  Sleep:  Number of Hours: 5.75   Musculoskeletal: Strength & Muscle Tone: within normal limits Gait & Station: normal Patient leans: N/A  Current Medications: Current Facility-Administered Medications  Medication Dose Route Frequency Provider Last Rate Last Dose  . acetaminophen (TYLENOL) tablet 650 mg  650 mg Oral Q4H PRN Court Joy, PA-C   650 mg at 11/20/13 5284  . alum & mag hydroxide-simeth (MAALOX/MYLANTA) 200-200-20 MG/5ML suspension 30 mL  30 mL Oral Q4H PRN Court Joy,  PA-C      . chlordiazePOXIDE (LIBRIUM) capsule 25 mg  25 mg Oral Q6H PRN Court Joyharles E Kober, PA-C      . FLUoxetine (PROZAC) capsule 20 mg  20 mg Oral Daily Fransisca KaufmannLaura Davis, NP   20 mg at 11/20/13 0735  . hydrOXYzine (ATARAX/VISTARIL) tablet 25 mg  25 mg Oral Q6H PRN Court Joyharles E Kober, PA-C   25 mg at 11/18/13  0813  . ibuprofen (ADVIL,MOTRIN) tablet 600 mg  600 mg Oral Q8H PRN Court Joyharles E Kober, PA-C      . influenza vac split quadrivalent PF (FLUARIX) injection 0.5 mL  0.5 mL Intramuscular Tomorrow-1000 Omelia Marquart      . loperamide (IMODIUM) capsule 2-4 mg  2-4 mg Oral PRN Court Joyharles E Kober, PA-C      . magnesium hydroxide (MILK OF MAGNESIA) suspension 30 mL  30 mL Oral Daily PRN Court Joyharles E Kober, PA-C      . multivitamin with minerals tablet 1 tablet  1 tablet Oral Daily Court Joyharles E Kober, PA-C   1 tablet at 11/20/13 [id number]  . ondansetron (ZOFRAN-ODT) disintegrating tablet 4 mg  4 mg Oral Q6H PRN Court Joyharles E Kober, PA-C      . pneumococcal 23 valent vaccine (PNU-IMMUNE) injection 0.5 mL  0.5 mL Intramuscular Tomorrow-1000 Yilia Sacca      . risperiDONE (RISPERDAL) tablet 1 mg  1 mg Oral QHS Fransisca KaufmannLaura Davis, NP   1 mg at 11/19/13 2225  . thiamine (B-1) injection 100 mg  100 mg Intramuscular Once Court Joyharles E Kober, PA-C      . thiamine (VITAMIN B-1) tablet 100 mg  100 mg Oral Daily Court Joyharles E Kober, PA-C   100 mg at 11/20/13 [id number]    Lab Results:  No results found for this or any previous visit (from the past 48 hour(s)).  Physical Findings: AIMS:  , ,  ,  ,    CIWA:  CIWA-Ar Total: 0 COWS:     Treatment Plan Summary: Daily contact with patient to assess and evaluate symptoms and progress in treatment Medication management  Plan: 1. Continue crisis management and stabilization.  2. Medication management: Continue Librium protocol for alcohol detox. -Continue Prozac 20 mg daily for depression. -Contnue Risperdal 1 mg hs for psychosis.  -Initiate Trazodone 100mg  qhs prn insomnia 3. Encouraged patient to attend groups and participate in group counseling sessions and activities.  4. Discharge plan in progress.  5. Continue current treatment plan.  6. Address health issues: Monitor blood pressure for continued elevations.   Medical Decision Making Problem Points:  Established problem, improving (1),  Review of last therapy session (1) and Review of psycho-social stressors (1) Data Points:  Review of medication regiment & side effects (2) Review of new medications or change in dosage (2)  I certify that inpatient services furnished can reasonably be expected to improve the patient's condition.   Thedore MinsAkintayo, Tatumn Corbridge, MD 11/20/2013, 11:22 AM

## 2013-11-20 NOTE — [facility] Group Notes (Signed)
Adult Psychoeducational Group Note  Date:  11/20/2013 Time:  9:05 PM  Group Topic/Focus:  Wrap-Up Group:   The focus of this group is to help patients review their daily goal of treatment and discuss progress on daily workbooks.  Participation Level:  Minimal  Participation Quality:  Attentive  Affect:  Flat  Cognitive:  Appropriate  Insight: Good  Engagement in Group:  Limited  Modes of Intervention:  Discussion  Additional Comments:  Bruce [name] stated that he chilled and watched tv.  He also said he was ready to go back to work at [employer].  Bruce [name] was quiet and reserved during group.  Bruce [name], Bruce [name] 11/20/2013, 9:05 PM

## 2013-11-20 NOTE — Progress Notes (Signed)
D: Pt presents with flat affect and depressed mood. Pt has minimal interaction on the milieu and isolates in his room. Pt continues to minimize his problem and denies having any symptoms at this time. Pt requesting to be discharge so he can return back to work. Pt has poor insight for treatment and stated that he was feeling depressed because his grandmother passed away recently. A: Medications administered as ordered per MD. Verbal support given. Pt encouraged to attend groups. 15 minute checks performed for safety. R: Pt safety maintained.

## 2013-11-20 NOTE — Progress Notes (Signed)
D: Pt denies SI/HI/AVH. Pt is pleasant and cooperative. Pt ready to get to work. Pt stated he was feeling better and ready to get out of here.   A: Pt was offered support and encouragement. Pt was given scheduled medications. Pt was encourage to attend groups. Q 15 minute checks were done for safety.   R:Pt attends groups and interacts well with peers and staff. Pt is taking medication. Pt has no complaints.Pt receptive to treatment and safety maintained on unit.

## 2013-11-21 NOTE — Progress Notes (Signed)
[facility] :  Will you be returning to the same living situation after discharge: Yes,  home At discharge, do you have transportation home?:Yes,  bus Do you have the ability to pay for your medications:Yes,  mental health  Release of information consent forms completed and in the chart;  [name] needed at discharge.  Patient to Follow up at: Follow-up Information   Follow up with [facility]. (Go to walk-in clinic M-F from 8-9am for your hopistal follow-up appointment.  )    Contact information:   [address] [place] [phone]      Follow up with [facility]  On 11/28/2013. (At 10am.  Go see Sharrie RothmanMary Easton for your individual counseling session.  )    Contact information:   [address]  [place], [place] [address] [phone]      Patient denies SI/HI:   Yes,  yes    Safety Planning and Suicide Prevention discussed:  Yes,  yes  Ida Rogueorth, Eustace Hur B 11/21/2013, 11:25 AM

## 2013-11-21 NOTE — [facility] Group Notes (Signed)
[facility]   11/21/2013 11:23 AM  Participation Quality:  Engaged  Mood/Affect:  Flat  Depression Rating:  2  Anxiety Rating:  2  Thoughts of Suicide:  No Will you contract for safety?   NA  Current AVH:  No  Plan for Discharge/Comments:  [name] asked for a letter for the furniture market when he leaves tomorrow.  [facility] and is open to a referral to Hospice for grief and loss counseling.  He asked to be provided with a bus pass tomorrow.  Transportation Means: bus  Supports: family  Kiribatiorth, Thereasa DistanceRodney B

## 2013-11-21 NOTE — [facility] Group Notes (Signed)
[facility]  11/21/2013  11:23 AM  Type of Therapy:  [employer] Presentation   Participation Level:  Minimal  Participation Quality:  Attentive  Affect:  Flat  Cognitive:  Appropriate  Insight:  Lacking  Engagement in Therapy:  Lacking  Modes of Intervention:  Discussion, Education and Socialization   Summary of Progress/Problems:  Onalee HuaDavid from [employer] came to present his recovery story and play the guitar.  Sajad clasped his hands and stared at the floor for the majority of group.  He appeared to be listening AEB smiling when the speaker talked about publishing books and looking at the [facility] brochure.   Nodded his head while the speaker played his music.    Simona Huhina Yang   11/21/2013  11:23 AM

## 2013-11-21 NOTE — [facility] Suicide Risk Assessment (Signed)
[facility] INPATIENT:  Family/Significant Other Suicide Prevention Education  Suicide Prevention Education:  Patient Refusal for Family/Significant Other Suicide Prevention Education: The patient Bruce [name] has refused to provide written consent for family/significant other to be provided Family/Significant Other Suicide Prevention Education during admission and/or prior to discharge.  Physician notified.  Simona HuhYang, Chason Mciver 11/21/2013, 11:02 AM

## 2013-11-21 NOTE — Progress Notes (Signed)
The focus of this group is to help patients review their daily goal of treatment and discuss progress on daily workbooks. Pt attended the evening group session and responded to all discussion prompts from the Writer. Pt shared that today was a good day on the unit, the highlight of which was learning that he would be discharged tomorrow and thus be able to return to work as he had hoped. "I feel good about my discharge. You guys have been great." Pt's only additional request from Nursing Staff this evening was to do laundry, which was taken care of following group. Pt's affect was appropriate.

## 2013-11-21 NOTE — Progress Notes (Signed)
Patient ID: Bruce [name], male   DOB: 02/22/1976, 38 y.o.   MRN: [id number] D: Patient stated he is doing well and had a good day. 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 Pt mood/affect is appropriate. Pt denies SI/HI/AVH and pain. Pt attended evening wrap up group and engaged in discussion.  Cooperative with assessment. No acute distressed noted at this time.   A: Met with pt 1:1. Medications administered as prescribed. Writer encouraged pt to discuss feelings. Pt encouraged to come to staff with any questions or concerns.   R: Patient is safe on the unit. He is complaint with medications and denies any adverse reaction. Continue current POC.

## 2013-11-21 NOTE — Progress Notes (Signed)
D: Pt presents with flat affect and depressed mood. Pt engaged with others today on the milieu. Pt reports decreased depression. No AVH verbalized by pt  this morning. Pt compliant with attending groups and taking meds. Pt verbalized this morning to writer that he do not want to take any meds that will cause him to feel drowsy. No adverse reactions to meds verbalized by pt at this time. A: Medications administered as ordered per MD. Verbal support given. Pt encouraged to attend groups. 15 minute checks performed for safety. R: Pt safety maintained at this time.

## 2013-11-21 NOTE — Progress Notes (Signed)
Patient ID: Bruce [name], male   DOB: 1976/07/04, 38 y.o.   MRN: [id number] [facility] MD Progress Note  11/21/2013 11:08 AM Bruce [name]  MRN:  [id number] Subjective:   Patient states "I am feeling much less depressed and anxious than when I got here. I have learned some coping skills. I want to go to grief counseling and I will continue taking my medications. I need to stay away from alcohol because it makes things worse."   Objective:  Patient seen and chart is reviewed. Bruce [name] is reporting decreased symptoms of depression, anxiety, and psychosis. He has been attending groups and actively working to develop healthier coping skills. Patient reports that in the future when feeling stressed that he will reach out to family more and get back into reading. Patient wants to deal with the loss of his grandmother by going to grief counseling. He is compliant with his scheduled medications.   Diagnosis:   DSM5: Total Time spent with patient: 20 minutes  Schizophrenia Disorders: Schizophrenia (295.7)  Obsessive-Compulsive Disorders: None  Trauma-Stressor Disorders: None  Substance/Addictive Disorders: Alcohol abuse  AXIS I: Paranoid schizophrenia  AXIS II: Deferred  AXIS III:  Past Medical History   Diagnosis  Date   .  Depression    .  Schizophrenia     AXIS IV: other psychosocial or environmental problems, problems related to social environment and problems with primary support group  AXIS V: 51-60 Moderate Symptoms  ADL's:  Intact  Sleep: Good  Appetite:  Good  Suicidal Ideation:  Denies Homicidal Ideation:  Denies AEB (as evidenced by):  Psychiatric Specialty Exam: Physical Exam  Review of Systems  Constitutional: Negative.   HENT: Negative.   Eyes: Negative.   Respiratory: Negative.   Cardiovascular: Negative.   Gastrointestinal: Negative.   Genitourinary: Negative.   Musculoskeletal: Negative.   Skin: Negative.   Neurological: Negative.   Endo/Heme/Allergies: Negative.    Psychiatric/Behavioral: Positive for depression and substance abuse. Negative for suicidal ideas, hallucinations and memory loss. The patient has insomnia. The patient is not nervous/anxious.     Blood pressure 123/83, pulse 81, temperature 97.7 F (36.5 C), temperature source Oral, resp. rate 20, height 5\' 6"  (1.676 m), weight 172.367 kg (380 lb).Body mass index is 61.36 kg/(m^2).  General Appearance: Casual  Eye Contact::  Good  Speech:  Clear and Coherent  Volume:  Normal  Mood:  Dysphoric  Affect:  Blunt  Thought Process:  Circumstantial  Orientation:  Full (Time, Place, and Person)  Thought Content:  WDL  Suicidal Thoughts:  No  Homicidal Thoughts:  No  Memory:  Immediate;   Good Recent;   Fair Remote;   Fair  Judgement:  Impaired  Insight:  Lacking  Psychomotor Activity:  Decreased  Concentration:  Fair  Recall:  Bruce [name] of Knowledge:Fair  Language: Fair  Akathisia:  No  Handed:  Right  AIMS (if indicated):     Assets:  Communication Skills Desire for Improvement Leisure Time Physical Health Resilience Social Support  Sleep:  Number of Hours: 6.75   Musculoskeletal: Strength & Muscle Tone: within normal limits Gait & Station: normal Patient leans: N/A  Current Medications: Current Facility-Administered Medications  Medication Dose Route Frequency Provider Last Rate Last Dose  . acetaminophen (TYLENOL) tablet 650 mg  650 mg Oral Q4H PRN Bruce Joy, PA-C   650 mg at 11/20/13 7829  . alum & mag hydroxide-simeth (MAALOX/MYLANTA) 200-200-20 MG/5ML suspension 30 mL  30 mL Oral Q4H PRN Bruce Joy, PA-C      .  FLUoxetine (PROZAC) capsule 20 mg  20 mg Oral Daily Bruce KaufmannLaura Davis, NP   20 mg at 11/21/13 0734  . ibuprofen (ADVIL,MOTRIN) tablet 600 mg  600 mg Oral Q8H PRN Bruce Joyharles E Kober, PA-C      . influenza vac split quadrivalent PF (FLUARIX) injection 0.5 mL  0.5 mL Intramuscular Tomorrow-1000 Kaedence Connelly      . magnesium hydroxide (MILK OF MAGNESIA)  suspension 30 mL  30 mL Oral Daily PRN Bruce Joyharles E Kober, PA-C      . multivitamin with minerals tablet 1 tablet  1 tablet Oral Daily Bruce Joyharles E Kober, PA-C   1 tablet at 11/21/13 0734  . OLANZapine zydis (ZYPREXA) disintegrating tablet 10 mg  10 mg Oral Q8H PRN Bruce [name]      . pneumococcal 23 valent vaccine (PNU-IMMUNE) injection 0.5 mL  0.5 mL Intramuscular Tomorrow-1000 Turner Kunzman      . risperiDONE (RISPERDAL) tablet 1 mg  1 mg Oral QHS Bruce KaufmannLaura Davis, NP   1 mg at 11/20/13 2144  . thiamine (B-1) injection 100 mg  100 mg Intramuscular Once Bruce Joyharles E Kober, PA-C      . thiamine (VITAMIN B-1) tablet 100 mg  100 mg Oral Daily Bruce Joyharles E Kober, PA-C   100 mg at 11/21/13 0734  . traZODone (DESYREL) tablet 100 mg  100 mg Oral QHS PRN Jasslyn Finkel   100 mg at 11/20/13 2144    Lab Results:  No results found for this or any previous visit (from the past 48 hour(s)).  Physical Findings: AIMS:  , ,  ,  ,    CIWA:  CIWA-Ar Total: 0 COWS:     Treatment Plan Summary: Daily contact with patient to assess and evaluate symptoms and progress in treatment Medication management  Plan: 1. Continue crisis management and stabilization.  2. Medication management: Reviewed with patient who denied adverse effects.  -Continue Prozac 20 mg daily for depression. -Continue Risperdal 1 mg hs for psychosis.  -Continue Trazodone 100mg  qhs prn insomnia 3. Encouraged patient to attend groups and participate in group counseling sessions and activities.  4. Discharge plan in progress. Anticipate d/c tomorrow.  5. Continue current treatment plan.  6. Address health issues: Vitals reviewed and stable.   Medical Decision Making Problem Points:  Established problem, improving (1), Review of last therapy session (1) and Review of psycho-social stressors (1) Data Points:  Review of medication regiment & side effects (2) Review of new medications or change in dosage (2)  I certify that inpatient services  furnished can reasonably be expected to improve the patient's condition.   Bruce KaufmannDAVIS, LAURA, NP-C 11/21/2013, 11:08 AM Patient seen, evaluated and I agree with notes by Nurse Practitioner. Thedore MinsMojeed Makeila Yamaguchi, MD

## 2013-11-22 DIAGNOSIS — F205 Residual schizophrenia: Principal | ICD-10-CM

## 2013-11-22 DIAGNOSIS — F1010 Alcohol abuse, uncomplicated: Secondary | ICD-10-CM

## 2013-11-22 MED ORDER — FLUOXETINE HCL 20 MG PO CAPS: 20.0000 mg | ORAL_CAPSULE | Freq: Every day | ORAL | Status: DC

## 2013-11-22 MED ORDER — TRAZODONE HCL 100 MG PO TABS: 100.0000 mg | ORAL_TABLET | Freq: Every evening | ORAL | Status: DC | PRN

## 2013-11-22 MED ORDER — RISPERIDONE 1 MG PO TABS: 1.0000 mg | ORAL_TABLET | Freq: Every day | ORAL | Status: DC

## 2013-11-22 MED ORDER — ADULT MULTIVITAMIN W/MINERALS CH: 1.0000 | ORAL_TABLET | Freq: Every day | ORAL | Status: DC

## 2013-11-22 NOTE — Discharge Summary (Signed)
Physician Discharge Summary Note  Patient:  Bruce [name] is an 38 y.o., male MRN:  [id number] DOB:  1975-12-29 Patient phone:  [phone] (home)  Patient address:   [place] [address],  Total Time spent with patient: 20 minutes  Date of Admission:  11/18/2013 Date of Discharge: 11/22/13  Reason for Admission: Psychotic symptoms   Discharge Diagnoses: Principal Problem:   Schizophrenia, residual, subchronic, with acute exacerbation Active Problems:   Alcohol dependence   Psychiatric Specialty Exam: Physical Exam  Psychiatric: He has a normal mood and affect. His speech is normal and behavior is normal. Judgment and thought content normal. Cognition and memory are normal.    Review of Systems  Constitutional: Negative.   HENT: Negative.   Eyes: Negative.   Respiratory: Negative.   Cardiovascular: Negative.   Gastrointestinal: Negative.   Genitourinary: Negative.   Musculoskeletal: Negative.   Skin: Negative.   Neurological: Negative.   Endo/Heme/Allergies: Negative.   Psychiatric/Behavioral: Negative for depression, suicidal ideas, hallucinations, memory loss and substance abuse. The patient is not nervous/anxious and does not have insomnia.     Blood pressure 118/81, pulse 86, temperature 98.1 F (36.7 C), temperature source Oral, resp. rate 20, height 5\' 6"  (1.676 m), weight 172.367 kg (380 lb).Body mass index is 61.36 kg/(m^2).  General Appearance: Fairly Groomed  Patent attorney::  Good  Speech:  Clear and Coherent and Normal Rate  Volume:  Normal  Mood:  Euthymic  Affect:  Appropriate  Thought Process:  Goal Directed  Orientation:  Full (Time, Place, and Person)  Thought Content:  Negative  Suicidal Thoughts:  No  Homicidal Thoughts:  No  Memory:  Immediate;   Fair Recent;   Fair Remote;   Fair  Judgement:  Fair  Insight:  Fair  Psychomotor Activity:  Normal  Concentration:  Fair  Recall:  Good  Fund of Knowledge:Good  Language: Good   Akathisia:  No  Handed:  Right  AIMS (if indicated):     Assets:  Communication Skills Desire for Improvement Physical Health  Sleep:  Number of Hours: 6.75    Past Psychiatric History: Diagnosis:  Hospitalizations:  Outpatient Care:  Substance Abuse Care:  Self-Mutilation:  Suicidal Attempts:  Violent Behaviors:   Musculoskeletal: Strength & Muscle Tone: within normal limits Gait & Station: normal Patient leans: N/A  DSM5:  AXIS I: Schizophrenia, residual, subchronic, with acute exacerbation  Alcohol use disorder  AXIS II: Deferred  AXIS III:  Past Medical History   Diagnosis  Date   .  Depression    .  Schizophrenia     AXIS IV: other psychosocial or environmental problems and problems related to social environment  AXIS V: 60-70 mild symptoms  Level of Care:  OP  Hospital Course:  Patient is a 38 year old African American man who admitted to the behavioral Center because of increase depression and having command hallucination to harm himself. Patient endorsed that he has been off of his psychotropic medication for more than 18 months. Recently he started to feel more depressed and having hallucination. He endorse recent stressor because his grandmother died 2 weeks ago and it was a funeral which led to increased hallucination. Patient admitted crying spells, insomnia, decreased attention and concentration. He feels sometimes hopeless helpless and worthless. He also feels that people are against him and they are going to hurt him. Patient has history of suicidal attempt in the past by cutting his wrist and taking overdose on his friend's medication. Patient has history of cutting behavior  however he has not done in recent months. Patient denies any homicidal thoughts but admitted that he will fight if he feels danger to himself. Patient has history of fighting in the past. Patient also relapsed into crack cocaine and alcohol. His last use was 3 weeks ago. He used to take  Zoloft, Wellbutrin and Saphris but do not remember the details. Patient also endorsed that his uncle is currently on medical floor because of his heart problem and that causes a lot of concern and uncertainty. Currently he is living with his friend but he has planned to live with his uncle in the future. The patient has no children. Currently he is working as a [employer] job. Patient has no current primary care physician. His blood alcohol level is 185. His UDS is negative.  Patient was admitted to the 400 hall for further assessment and medication management. The patient had not been taking any psychiatric medications for a long time. Bruce [name] was started on Prozac 20 mg daily for depression and Risperdal 1 mg daily for psychosis. Patient attended groups and interacted well with peers. There were no episodes of seclusion or restraint during his admission. Patient reported a gradual decrease in his depressive symptoms and denied hearing voices. He was compliant with medications and denied any adverse effects. Patient reported that he wanted to learn how to cope in healthier ways. He was open to attending grief counseling to deal with the death of his grandmother. Patient was found stable to discharge by the treatment team. The patient adamantly denied SI/HI/AVH on the day of discharge. Patient was provided with prescriptions and medication samples. He left [facility] in stable condition with all his belongings that were returned. Patient was given a bus pass and plans on staying with a friend.   Consults:  None  Significant Diagnostic Studies:  Admission labs WNL  Discharge Vitals:   Blood pressure 118/81, pulse 86, temperature 98.1 F (36.7 C), temperature source Oral, resp. rate 20, height 5\' 6"  (1.676 m), weight 172.367 kg (380 lb). Body mass index is 61.36 kg/(m^2). Lab Results:   No results found for this or any previous visit (from the past 72 hour(s)).  Physical Findings: AIMS: Facial and Oral  Movements Muscles of Facial Expression: None, normal Lips and Perioral Area: None, normal Jaw: None, normal Tongue: None, normal,Extremity Movements Upper (arms, wrists, hands, fingers): None, normal Lower (legs, knees, ankles, toes): None, normal, Trunk Movements Neck, shoulders, hips: None, normal, Overall Severity Severity of abnormal movements (highest score from questions above): None, normal Incapacitation due to abnormal movements: None, normal Patient's awareness of abnormal movements (rate only patient's report): No Awareness, Dental Status Current problems with teeth and/or dentures?: No Does patient usually wear dentures?: No  CIWA:  CIWA-Ar Total: 0 COWS:     Psychiatric Specialty Exam: See Psychiatric Specialty Exam and Suicide Risk Assessment completed by Attending Physician prior to discharge.  Discharge destination:  Home  Is patient on multiple antipsychotic therapies at discharge:  No   Has Patient had three or more failed trials of antipsychotic monotherapy by history:  No  Recommended Plan for Multiple Antipsychotic Therapies: NA     Medication List       Indication   FLUoxetine 20 MG capsule  Commonly known as:  PROZAC  Take 1 capsule (20 mg total) by mouth daily.   Indication:  Depression     multivitamin with minerals Tabs tablet  Take 1 tablet by mouth daily. May purchase over the counter.  Indication:  Vitamin supplementation     risperiDONE 1 MG tablet  Commonly known as:  RISPERDAL  Take 1 tablet (1 mg total) by mouth at bedtime.   Indication:  Schizophrenia     traZODone 100 MG tablet  Commonly known as:  DESYREL  Take 1 tablet (100 mg total) by mouth at bedtime as needed for sleep.   Indication:  Trouble Sleeping           Follow-up Information   Follow up with [facility]. (Go to walk-in clinic M-F from 8-9am for your hopistal follow-up appointment.  )    Contact information:   [address] [place] [phone]       Follow up with [facility]  On 11/28/2013. (At 10am.  Go see Sharrie Rothman for your individual counseling session.  )    Contact information:   [address]  [place], [place] [address] [phone]      Follow-up recommendations:   Activity: as tolerated  Diet: healthy  Tests: routine  Other: patient to keep his aftercare appointement   Comments:   Take all your medications as prescribed by your mental healthcare provider.  Report any adverse effects and or reactions from your medicines to your outpatient provider promptly.  Patient is instructed and cautioned to not engage in alcohol and or illegal drug use while on prescription medicines.  In the event of worsening symptoms, patient is instructed to call the crisis hotline, 911 and or go to the nearest ED for appropriate evaluation and treatment of symptoms.  Follow-up with your primary care provider for your other medical issues, concerns and or health care needs.   Total Discharge Time:  Greater than 30 minutes.  SignedFransisca Kaufmann NP-C 11/22/2013, 9:23 AM  Patient seen, evaluated and I agree with notes by Nurse Practitioner. Thedore Mins, MD

## 2013-11-22 NOTE — [facility] Suicide Risk Assessment (Signed)
   Demographic Factors:  Male, Low socioeconomic status and employed  Total Time spent with patient: 20 minutes  Psychiatric Specialty Exam: Physical Exam  Psychiatric: He has a normal mood and affect. His speech is normal and behavior is normal. Judgment and thought content normal. Cognition and memory are normal.    Review of Systems  Constitutional: Negative.   HENT: Negative.   Eyes: Negative.   Respiratory: Negative.   Cardiovascular: Negative.   Gastrointestinal: Negative.   Genitourinary: Negative.   Musculoskeletal: Negative.   Skin: Negative.   Neurological: Negative.   Endo/Heme/Allergies: Negative.   Psychiatric/Behavioral: Negative.     Blood pressure 118/81, pulse 86, temperature 98.1 F (36.7 C), temperature source Oral, resp. rate 20, height 5\' 6"  (1.676 m), weight 172.367 kg (380 lb).Body mass index is 61.36 kg/(m^2).  General Appearance: Fairly Groomed  Patent attorneyye Contact::  Good  Speech:  Clear and Coherent and Normal Rate  Volume:  Normal  Mood:  Euthymic  Affect:  Appropriate  Thought Process:  Goal Directed  Orientation:  Full (Time, Place, and Person)  Thought Content:  Negative  Suicidal Thoughts:  No  Homicidal Thoughts:  No  Memory:  Immediate;   Fair Recent;   Fair Remote;   Fair  Judgement:  Fair  Insight:  Fair  Psychomotor Activity:  Normal  Concentration:  Fair  Recall:  Good  Fund of Knowledge:Good  Language: Good  Akathisia:  No  Handed:  Right  AIMS (if indicated):     Assets:  Communication Skills Desire for Improvement Physical Health  Sleep:  Number of Hours: 6.75    Musculoskeletal: Strength & Muscle Tone: within normal limits Gait & Station: normal Patient leans: N/A   Mental Status Per Nursing Assessment::   On Admission:  Suicidal ideation indicated by patient  Current Mental Status by Physician: patient denies suicidal ideation, intent or plan  Loss Factors: Financial problems/change in socioeconomic  status  Historical Factors: Impulsivity  Risk Reduction Factors:   Sense of responsibility to family, Living with another person, especially a relative, Positive social support and employed  Continued Clinical Symptoms:  Alcohol/Substance Abuse/Dependencies  Cognitive Features That Contribute To Risk:  Closed-mindedness    Suicide Risk:  Minimal: No identifiable suicidal ideation.  Patients presenting with no risk factors but with morbid ruminations; may be classified as minimal risk based on the severity of the depressive symptoms  Discharge Diagnoses:   AXIS I:  Schizophrenia, residual, subchronic, with acute exacerbation               Alcohol use disorder AXIS II:  Deferred AXIS III:   Past Medical History  Diagnosis Date  . Depression   . Schizophrenia    AXIS IV:  other psychosocial or environmental problems and problems related to social environment AXIS V:  60-70 mild symptoms  Plan Of Care/Follow-up recommendations:  Activity:  as tolerated Diet:  healthy Tests:  routine Other:  patient to keep his aftercare appointement  Is patient on multiple antipsychotic therapies at discharge:  No   Has Patient had three or more failed trials of antipsychotic monotherapy by history:  No  Recommended Plan for Multiple Antipsychotic Therapies: NA    Thedore MinsAkintayo, Davyn Morandi, MD 11/22/2013, 9:36 AM

## 2013-11-22 NOTE — Progress Notes (Signed)
Patient ID: [name], male   DOB: Jul 30, 1976, 38 y.o.   MRN: [id number] Nursing discharge note:  Patient discharged home per MD order.  Patient received all personal belongings, prescriptions and medication samples.  He denies SI/HI/AVH.  Patient received a bus pass for transportation.  He plans on staying with a friend.

## 2013-11-27 NOTE — Progress Notes (Signed)
Patient Discharge Instructions:  After Visit Summary (AVS):   Faxed to:  11/27/13 Discharge Summary Note:   Faxed to:  11/27/13 Psychiatric Admission Assessment Note:   Faxed to:  11/27/13 Suicide Risk Assessment - Discharge Assessment:   Faxed to:  11/27/13 Faxed/Sent to the Next Level Care provider:  11/27/13 [facility] @ [phone] [facility] @ [phone]  Jerelene ReddenSheena E Helena, 11/27/2013, 3:43 PM

## 2015-06-04 ENCOUNTER — Emergency Department (HOSPITAL_COMMUNITY): Payer: No Typology Code available for payment source

## 2015-06-04 ENCOUNTER — Encounter (HOSPITAL_COMMUNITY): Payer: Self-pay | Admitting: Emergency Medicine

## 2015-06-04 ENCOUNTER — Emergency Department (HOSPITAL_COMMUNITY)
Admission: EM | Admit: 2015-06-04 | Discharge: 2015-06-05 | Disposition: A | Discharge status: Other Acute Inpatient Hospital | Payer: No Typology Code available for payment source | Attending: Emergency Medicine | Admitting: Emergency Medicine

## 2015-06-04 DIAGNOSIS — Z79899 Other long term (current) drug therapy: Secondary | ICD-10-CM | POA: Insufficient documentation

## 2015-06-04 DIAGNOSIS — F329 Major depressive disorder, single episode, unspecified: Secondary | ICD-10-CM | POA: Insufficient documentation

## 2015-06-04 DIAGNOSIS — F209 Schizophrenia, unspecified: Secondary | ICD-10-CM | POA: Insufficient documentation

## 2015-06-04 DIAGNOSIS — R45850 Homicidal ideations: Secondary | ICD-10-CM

## 2015-06-04 DIAGNOSIS — F205 Residual schizophrenia: Secondary | ICD-10-CM | POA: Diagnosis present

## 2015-06-04 DIAGNOSIS — R45851 Suicidal ideations: Secondary | ICD-10-CM

## 2015-06-04 DIAGNOSIS — F32A Depression, unspecified: Secondary | ICD-10-CM

## 2015-06-04 LAB — COMPREHENSIVE METABOLIC PANEL
ALBUMIN: 3.3 g/dL — AB (ref 3.5–5.0)
ALK PHOS: 72 U/L (ref 38–126)
ALT: 25 U/L (ref 17–63)
AST: 22 U/L (ref 15–41)
Anion gap: 9 (ref 5–15)
BILIRUBIN TOTAL: 0.3 mg/dL (ref 0.3–1.2)
BUN: 6 mg/dL (ref 6–20)
CO2: 27 mmol/L (ref 22–32)
CREATININE: 0.77 mg/dL (ref 0.61–1.24)
Calcium: 9.1 mg/dL (ref 8.9–10.3)
Chloride: 100 mmol/L — ABNORMAL LOW (ref 101–111)
GFR calc Af Amer: >60 mL/min (ref >60)
GFR calc non Af Amer: >60 mL/min (ref >60)
GLUCOSE: 100 mg/dL — AB (ref 65–99)
Potassium: 4 mmol/L (ref 3.5–5.1)
SODIUM: 136 mmol/L (ref 135–145)
Total Protein: 7.4 g/dL (ref 6.5–8.1)

## 2015-06-04 LAB — SALICYLATE LEVEL: Salicylate Lvl: <4 mg/dL (ref 2.8–30.0)

## 2015-06-04 LAB — CBC
HEMATOCRIT: 40.1 % (ref 39.0–52.0)
HEMOGLOBIN: 13.1 g/dL (ref 13.0–17.0)
MCH: 29 pg (ref 26.0–34.0)
MCHC: 32.7 g/dL (ref 30.0–36.0)
MCV: 88.7 fL (ref 78.0–100.0)
Platelets: 233 10*3/uL (ref 150–400)
RBC: 4.52 MIL/uL (ref 4.22–5.81)
RDW: 13.1 % (ref 11.5–15.5)
WBC: 7.3 10*3/uL (ref 4.0–10.5)

## 2015-06-04 LAB — RAPID URINE DRUG SCREEN, HOSP PERFORMED
Amphetamines: NOT DETECTED
BARBITURATES: NOT DETECTED
BENZODIAZEPINES: NOT DETECTED
Cocaine: NOT DETECTED
Opiates: NOT DETECTED
TETRAHYDROCANNABINOL: NOT DETECTED

## 2015-06-04 LAB — ACETAMINOPHEN LEVEL

## 2015-06-04 LAB — ETHANOL: Alcohol, Ethyl (B): 128 mg/dL — ABNORMAL HIGH (ref <5)
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
## 2015-06-05 ENCOUNTER — Encounter (HOSPITAL_COMMUNITY): Payer: Self-pay | Admitting: *Deleted

## 2015-06-05 ENCOUNTER — Inpatient Hospital Stay (HOSPITAL_COMMUNITY)
Admission: AD | Admit: 2015-06-05 | Discharge: 2015-06-13 | DRG: 885 | Disposition: A | Discharge status: Home / Self Care | Payer: Federal, State, Local not specified - Other | Source: Intra-hospital | Attending: Psychiatry | Admitting: Psychiatry

## 2015-06-05 DIAGNOSIS — F489 Nonpsychotic mental disorder, unspecified: Secondary | ICD-10-CM | POA: Diagnosis not present

## 2015-06-05 DIAGNOSIS — Z818 Family history of other mental and behavioral disorders: Secondary | ICD-10-CM | POA: Diagnosis not present

## 2015-06-05 DIAGNOSIS — R45851 Suicidal ideations: Secondary | ICD-10-CM | POA: Diagnosis present

## 2015-06-05 DIAGNOSIS — F1020 Alcohol dependence, uncomplicated: Secondary | ICD-10-CM | POA: Diagnosis present

## 2015-06-05 DIAGNOSIS — Y906 Blood alcohol level of 120-199 mg/100 ml: Secondary | ICD-10-CM | POA: Diagnosis present

## 2015-06-05 DIAGNOSIS — R4589 Other symptoms and signs involving emotional state: Secondary | ICD-10-CM | POA: Diagnosis present

## 2015-06-05 DIAGNOSIS — F205 Residual schizophrenia: Principal | ICD-10-CM | POA: Diagnosis present

## 2015-06-05 DIAGNOSIS — Z590 Homelessness: Secondary | ICD-10-CM

## 2015-06-05 DIAGNOSIS — F203 Undifferentiated schizophrenia: Secondary | ICD-10-CM | POA: Diagnosis not present

## 2015-06-05 DIAGNOSIS — R443 Hallucinations, unspecified: Secondary | ICD-10-CM | POA: Diagnosis not present

## 2015-06-05 DIAGNOSIS — F209 Schizophrenia, unspecified: Secondary | ICD-10-CM | POA: Diagnosis present

## 2015-06-05 DIAGNOSIS — R4689 Other symptoms and signs involving appearance and behavior: Secondary | ICD-10-CM

## 2015-06-05 MED ORDER — RISPERIDONE 1 MG PO TABS
1.0000 mg | ORAL_TABLET | Freq: Every day | ORAL | Status: DC
  Administered 2015-06-05: 1 mg via ORAL
  Filled 2015-06-05 (×4): qty 1

## 2015-06-05 MED ORDER — LORAZEPAM 1 MG PO TABS
1.0000 mg | ORAL_TABLET | Freq: Three times a day (TID) | ORAL | Status: DC | PRN
  Administered 2015-06-06: 1 mg via ORAL
  Filled 2015-06-05: qty 1

## 2015-06-05 MED ORDER — IBUPROFEN 400 MG PO TABS: 600.0000 mg | ORAL_TABLET | Freq: Three times a day (TID) | ORAL | Status: DC | PRN

## 2015-06-05 MED ORDER — FLUOXETINE HCL 20 MG PO CAPS: 20.0000 mg | ORAL_CAPSULE | Freq: Every day | ORAL | Status: DC

## 2015-06-05 MED ORDER — ACETAMINOPHEN 325 MG PO TABS: 650.0000 mg | ORAL_TABLET | ORAL | Status: DC | PRN

## 2015-06-05 MED ORDER — MAGNESIUM HYDROXIDE 400 MG/5ML PO SUSP: 30.0000 mL | Freq: Every day | ORAL | Status: DC | PRN

## 2015-06-05 MED ORDER — IBUPROFEN 600 MG PO TABS
600.0000 mg | ORAL_TABLET | Freq: Three times a day (TID) | ORAL | Status: DC | PRN
  Administered 2015-06-10: 600 mg via ORAL
  Filled 2015-06-05: qty 1

## 2015-06-05 MED ORDER — CALCIUM CARBONATE ANTACID 500 MG PO CHEW
1.0000 | CHEWABLE_TABLET | Freq: Once | ORAL | Status: AC
  Administered 2015-06-05: 200 mg via ORAL
  Filled 2015-06-05: qty 1

## 2015-06-05 MED ORDER — RISPERIDONE 0.5 MG PO TABS: 1.0000 mg | ORAL_TABLET | Freq: Every day | ORAL | Status: DC

## 2015-06-05 MED ORDER — ADULT MULTIVITAMIN W/MINERALS CH: 1.0000 | ORAL_TABLET | Freq: Every day | ORAL | Status: DC

## 2015-06-05 MED ORDER — TRAZODONE HCL 100 MG PO TABS: 100.0000 mg | ORAL_TABLET | Freq: Every evening | ORAL | Status: DC | PRN

## 2015-06-05 MED ORDER — FLUOXETINE HCL 20 MG PO CAPS
20.0000 mg | ORAL_CAPSULE | Freq: Every day | ORAL | Status: DC
  Administered 2015-06-06 – 2015-06-13 (×8): 20 mg via ORAL
  Filled 2015-06-05: qty 1
  Filled 2015-06-05: qty 7
  Filled 2015-06-05 (×8): qty 1
  Filled 2015-06-05: qty 7
  Filled 2015-06-05: qty 1

## 2015-06-05 MED ORDER — LORAZEPAM 1 MG PO TABS
1.0000 mg | ORAL_TABLET | Freq: Three times a day (TID) | ORAL | Status: DC | PRN
  Administered 2015-06-05: 1 mg via ORAL
  Filled 2015-06-05: qty 1

## 2015-06-05 MED ORDER — ADULT MULTIVITAMIN W/MINERALS CH
1.0000 | ORAL_TABLET | Freq: Every day | ORAL | Status: DC
  Administered 2015-06-06 – 2015-06-13 (×8): 1 via ORAL
  Filled 2015-06-05 (×4): qty 1
  Filled 2015-06-05: qty 7
  Filled 2015-06-05: qty 1
  Filled 2015-06-05: qty 7
  Filled 2015-06-05 (×5): qty 1

## 2015-06-05 MED ORDER — TRAZODONE HCL 100 MG PO TABS
100.0000 mg | ORAL_TABLET | Freq: Every evening | ORAL | Status: DC | PRN
  Administered 2015-06-05: 100 mg via ORAL
  Filled 2015-06-05: qty 1

## 2015-06-05 MED ORDER — ALUM & MAG HYDROXIDE-SIMETH 200-200-20 MG/5ML PO SUSP
30.0000 mL | ORAL | Status: DC | PRN
  Administered 2015-06-06: 30 mL via ORAL
  Filled 2015-06-05: qty 30
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
## 2015-06-05 NOTE — ED Notes (Signed)
Belongings inventoried and placed in the holding area

## 2015-06-05 NOTE — Progress Notes (Signed)
[facility] to 507[facility]1, Dr. Elna BreslowEappen. Patient to arrive at 20:00. RN Kandis Fantasiaroin was informed.  [place] Flett, LCSWA Disposition staff 06/05/2015 5:06 PM

## 2015-06-05 NOTE — Progress Notes (Signed)
Seeking inpatient psych placement. [facility].  Ref[facility] to [facility] per Randa EvensJoanne, no beds at present but will review for waiting list [place] per [place] [facility], possible beds later today  Ilean SkillMeghan Demitrious Mccannon, MSW, LCSW Clinical Social Work, Disposition  06/05/2015 [phone]

## 2015-06-05 NOTE — ED Notes (Signed)
Pt a+o, resting. Pt sts he is feeling a little anxious but does not want Ativan.

## 2015-06-05 NOTE — ED Notes (Signed)
PT on with TTS
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
## 2015-06-06 ENCOUNTER — Encounter (HOSPITAL_COMMUNITY): Payer: Self-pay | Admitting: Psychiatry

## 2015-06-06 DIAGNOSIS — F203 Undifferentiated schizophrenia: Secondary | ICD-10-CM

## 2015-06-06 DIAGNOSIS — F1020 Alcohol dependence, uncomplicated: Secondary | ICD-10-CM

## 2015-06-06 MED ORDER — TRAZODONE HCL 50 MG PO TABS
50.0000 mg | ORAL_TABLET | Freq: Every evening | ORAL | Status: DC | PRN
  Administered 2015-06-06 – 2015-06-10 (×5): 50 mg via ORAL
  Filled 2015-06-06 (×5): qty 1

## 2015-06-06 MED ORDER — ONDANSETRON 4 MG PO TBDP: 4.0000 mg | ORAL_TABLET | Freq: Four times a day (QID) | ORAL | Status: AC | PRN

## 2015-06-06 MED ORDER — HYDROXYZINE HCL 25 MG PO TABS
25.0000 mg | ORAL_TABLET | Freq: Four times a day (QID) | ORAL | Status: AC | PRN
  Administered 2015-06-06 – 2015-06-09 (×3): 25 mg via ORAL
  Filled 2015-06-06 (×3): qty 1

## 2015-06-06 MED ORDER — VITAMIN B-1 100 MG PO TABS
100.0000 mg | ORAL_TABLET | Freq: Once | ORAL | Status: AC
  Administered 2015-06-06: 100 mg via ORAL
  Filled 2015-06-06 (×2): qty 1

## 2015-06-06 MED ORDER — BENZTROPINE MESYLATE 0.5 MG PO TABS
0.5000 mg | ORAL_TABLET | Freq: Two times a day (BID) | ORAL | Status: DC
  Administered 2015-06-06 – 2015-06-13 (×14): 0.5 mg via ORAL
  Filled 2015-06-06 (×5): qty 1
  Filled 2015-06-06: qty 28
  Filled 2015-06-06: qty 1
  Filled 2015-06-06: qty 28
  Filled 2015-06-06 (×7): qty 1
  Filled 2015-06-06: qty 28
  Filled 2015-06-06 (×3): qty 1
  Filled 2015-06-06: qty 28
  Filled 2015-06-06: qty 1

## 2015-06-06 MED ORDER — LOPERAMIDE HCL 2 MG PO CAPS: 2.0000 mg | ORAL_CAPSULE | ORAL | Status: AC | PRN

## 2015-06-06 MED ORDER — INFLUENZA VAC SPLIT QUAD 0.5 ML IM SUSY
0.5000 mL | PREFILLED_SYRINGE | INTRAMUSCULAR | Status: AC
  Administered 2015-06-07: 0.5 mL via INTRAMUSCULAR
  Filled 2015-06-06: qty 0.5

## 2015-06-06 MED ORDER — LORAZEPAM 1 MG PO TABS
1.0000 mg | ORAL_TABLET | Freq: Four times a day (QID) | ORAL | Status: AC | PRN
  Administered 2015-06-08: 1 mg via ORAL
  Filled 2015-06-06: qty 1

## 2015-06-06 MED ORDER — HALOPERIDOL 5 MG PO TABS
5.0000 mg | ORAL_TABLET | Freq: Every day | ORAL | Status: DC
  Administered 2015-06-06 – 2015-06-08 (×3): 5 mg via ORAL
  Filled 2015-06-06 (×5): qty 1

## 2015-06-06 MED ORDER — THIAMINE HCL 100 MG/ML IJ SOLN: 100.0000 mg | Freq: Once | INTRAMUSCULAR | Status: DC

## 2015-06-06 MED ORDER — VITAMIN B-1 100 MG PO TABS
100.0000 mg | ORAL_TABLET | Freq: Every day | ORAL | Status: DC
  Administered 2015-06-07 – 2015-06-13 (×7): 100 mg via ORAL
  Filled 2015-06-06 (×9): qty 1

## 2015-06-06 NOTE — Plan of Care (Signed)
Problem: Ineffective individual coping Goal: STG: Patient will remain free from self harm Outcome: Progressing Pt safe on the unit   Problem: Alteration in mood Goal: LTG-Patient reports reduction in suicidal thoughts (Patient reports reduction in suicidal thoughts and is able to verbalize a safety plan for whenever patient is feeling suicidal)  Outcome: Not Progressing Pt SI-contracts for safety
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
## 2015-06-06 NOTE — Tx Team (Signed)
Interdisciplinary Treatment Plan Update (Adult) Date: 06/06/2015    Time Reviewed: 9:30 AM  Progress in Treatment: Attending groups: Continuing to assess, patient new to milieu Participating in groups: Continuing to assess, patient new to milieu Taking medication as prescribed: Yes Tolerating medication: Yes Family/Significant other contact made: No, CSW assessing for appropriate contacts Patient understands diagnosis: Yes Discussing patient identified problems/goals with staff: Yes Medical problems stabilized or resolved: Yes Denies suicidal/homicidal ideation: Yes Issues/concerns per patient self-inventory: Yes Other:  New problem(s) identified: N/A  Discharge Plan or Barriers: CSW continuing to assess, patient new to milieu.  Reason for Continuation of Hospitalization:  Depression Anxiety Medication Stabilization   Comments: N/A  Estimated length of stay: 3-5 days   Patient is a 39 year old male admitted for SI with plan to jump off bridge and alcohol abuse. Patient will benefit from crisis stabilization, medication evaluation, group therapy, and psycho education in addition to case management for discharge planning. Patient and CSW reviewed pt's identified goals and treatment plan. Pt verbalized understanding and agreed to treatment plan.     Review of initial/current patient goals per problem list:  1. Goal(s): Patient will participate in aftercare plan   Met: No   Target date: 3-5 days post admission date   As evidenced by: Patient will participate within aftercare plan AEB aftercare provider and housing plan at discharge being identified.   10/14: Goal not met: CSW assessing for appropriate referrals for pt and will have follow up secured prior to d/c.     2. Goal (s): Patient will exhibit decreased depressive symptoms and suicidal ideations.   Met: No   Target date: 3-5 days post admission date   As evidenced by: Patient will utilize self  rating of depression at 3 or below and demonstrate decreased signs of depression or be deemed stable for discharge by MD.  10/14: Goal not met: Pt presents with flat affect and depressed mood.  Pt with depression rating of 9 today.  Pt to show decreased sign of depression and a rating of 3 or less before d/c.        3. Goal(s): Patient will demonstrate decreased signs and symptoms of anxiety.   Met: No   Target date: 3-5 days post admission date   As evidenced by: Patient will utilize self rating of anxiety at 3 or below and demonstrated decreased signs of anxiety, or be deemed stable for discharge by MD  10/14: Goal not met: Pt presents with anxious mood and affect.  Pt with anxiety rating of 7 today.  Pt to show decreased sign of anxiety and a rating of 3 or less before d/c.         4. Goal(s): Patient will demonstrate decreased signs of withdrawal due to substance abuse   Met: Yes   Target date: 3-5 days post admission date   As evidenced by: Patient will produce a CIWA/COWS score of 0, have stable vitals signs, and no symptoms of withdrawal  10/14: Goal met: No withdrawal symptoms reported at this time per medical chart.      Attendees: Patient:    Family:    Physician: Dr. Parke Poisson 06/06/2015 9:30 AM  Nursing:  Algernon Huxley, [place] Guardian  RN 06/06/2015 9:30 AM  Clinical Social Worker: Erasmo Downer Addysyn Fern,  [place] 06/06/2015 9:30 AM   06/06/2015 9:30 AM  Other: Lucinda Dell, [place] Sessions Liaison 06/06/2015 9:30 AM  Other: Lars Pinks, Case Manager 06/06/2015 9:30 AM  Other:  Catalina Pizza, PA 06/06/2015  9:30 AM  Other:    Other:    Other:    Other:      Scribe for Treatment Team:  Tilden Fossa, MSW, [place]

## 2015-06-06 NOTE — Plan of Care (Signed)
Problem: Ineffective individual coping Goal: LTG: Patient will report a decrease in negative feelings Outcome: Not Progressing Pt reports feeling of si and voices to hurt himself and others.

## 2015-06-06 NOTE — [facility] Suicide Risk Assessment (Signed)
[facility]   Nursing information obtained from:  Patient Demographic factors:  Male, Low socioeconomic status, Unemployed Current Mental Status:  Suicidal ideation indicated by patient Loss Factors:  Loss of significant relationship, Financial problems / change in socioeconomic status Historical Factors:  Prior suicide attempts, Family history of mental illness or substance abuse, Impulsivity Risk Reduction Factors:  Resilience  Total Time spent with patient: 45 minutes Principal Problem:  Schizophrenia , Depression, Alcohol Dependence Diagnosis:   Patient Active Problem List   Diagnosis Date Noted  . Suicidal behavior [F48.9] 06/05/2015  . Alcohol dependence (HCC) [F10.20] 11/20/2013  . 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  World Science writerHealth Organization [facility](WHO). Score between 0-7:  no or low risk or alcohol related problems. Score between 8-15:  moderate risk of alcohol related problems. Score between 16-19:  high risk of alcohol related problems. Score 20 or above:  warrants further diagnostic evaluation for alcohol dependence and treatment.   CLINICAL FACTORS:  39 year old male, history of schizophrenia, history of depression, presents due to worsening depression, auditory hallucinations, in the context of medication non compliance x several weeks and severe stressors such as homelessness .  History of alcohol dependence , not presenting with any alcohol WDL symptoms at present .     Psychiatric Specialty Exam: Physical Exam  ROS  Blood pressure 113/76, pulse 93, temperature 98.5 F (36.9 C), temperature source Oral, resp. rate 18, height 5\' 7"  (1.702 m), weight 350 lb (158.759 kg), SpO2 100 %.Body mass index  is 54.8 kg/(m^2).  See Admit Note MSE   COGNITIVE FEATURES THAT CONTRIBUTE TO RISK:  Closed-mindedness and Loss of executive function    SUICIDE RISK:   Moderate:  Frequent suicidal ideation with limited intensity, and duration, some specificity in terms of plans, no associated intent, good self-control, limited dysphoria/symptomatology, some risk factors present, and identifiable protective factors, including available and accessible social support.  PLAN OF CARE: Patient will be admitted to inpatient psychiatric unit for stabilization and safety. Will provide and encourage milieu participation. Provide medication management and maked adjustments as needed.  Will also provide medication management to minimize risk of alcohol WDL. Will follow daily.    Medical Decision Making:  Review of Psycho-Social Stressors (1), Review or order clinical lab tests (1), Established Problem, Worsening (2) and Review of Medication Regimen & Side Effects (2)  I certify that inpatient services furnished can reasonably be expected to improve the patient's condition.   COBOS, Madaline GuthrieFERNANDO 06/06/2015, 4:38 PM
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
## 2015-06-06 NOTE — [facility] Group Notes (Signed)
   [facility]  06/06/2015  8:45 AM   Participation Quality: Alert, Appropriate and Oriented  Mood/Affect: Depressed and Flat  Depression Rating: 9  Anxiety Rating: 7  Thoughts of Suicide: Pt endorses SI  Will you contract for safety? Yes  Current AVH: Pt denies  Plan for Discharge/Comments: Pt attended discharge planning group and actively participated in group. CSW provided pt with today's workbook. Patient is homeless and has no current outpatient providers.   Transportation Means: Pt reports access to transportation  Supports: No supports mentioned at this time  Samuella BruinKristin Coulter Oldaker, MSW, [employer]CSWA Clinical Social Worker [facility] [phone]

## 2015-06-06 NOTE — Progress Notes (Signed)
D: Pt SI-contracts for safety denies HI/AVH. Pt is pleasant and cooperative. Pt stated he has increased Dep and anxiety today.   A: Pt was offered support and encouragement. Pt was given scheduled medications. Pt was encourage to attend groups. Q 15 minute checks were done for safety.   R:Pt attends groups and interacts well with peers and staff. Pt is taking medication. Pt has no complaints at .Pt receptive to treatment and safety maintained on unit.

## 2015-06-06 NOTE — [facility] Group Notes (Signed)
[facility] LCSW Group Therapy 06/06/2015 1:15 PM Type of Therapy: Group Therapy Participation Level: Minimal  Participation Quality: Attentive  Affect: Depressed and Flat  Cognitive: Alert and Oriented  Insight: Developing/Improving and Engaged  Engagement in Therapy: Developing/Improving and Engaged  Modes of Intervention: Clarification, Confrontation, Discussion, Education, Exploration, Limit-setting, Orientation, Problem-solving, Rapport Building, Dance movement psychotherapisteality Testing, Socialization and Support  Summary of Progress/Problems: The topic for today was feelings about relapse. Pt discussed what relapse prevention is to them and identified triggers that they are on the path to relapse. Pt processed their feeling towards relapse and was able to relate to peers. Pt discussed coping skills that can be used for relapse prevention. Patient actively listened during discussion but participated minimally despite CSW encouragement.   Samuella BruinKristin Treazure Nery, MSW, [employer]CSWA Clinical Social Worker [facility] [phone]

## 2015-06-06 NOTE — [facility] Suicide Risk Assessment (Signed)
[facility] INPATIENT:  Family/Significant Other Suicide Prevention Education  Suicide Prevention Education:  Patient Refusal for Family/Significant Other Suicide Prevention Education: The patient Bruce [name] has refused to provide written consent for family/significant other to be provided Family/Significant Other Suicide Prevention Education during admission and/or prior to discharge.  Physician notified. SPE reviewed with patient and brochure provided. Patient encouraged to return to hospital if having suicidal thoughts, patient verbalized his/her understanding and has no further questions at this time.   Mckinzey Entwistle, West CarboKristin L 06/06/2015, 5:39 PM

## 2015-06-06 NOTE — Progress Notes (Signed)
D: Pt is a 39 year old [facility] for SI, HI, and command hallucinations.  Pt reports he is here because "I hear voices, seems like the whole world's against me, seems like everything is going downhill."  Pt reports stressors including: homelessness, loss of grandmother and grandfather, not being able to talk to his mother because she is in an assisted living facility, financial issues.  He reports "it's been a while" since he last took any medication (except for in the ED).  Pt reports alcohol use, stating he last drank alcohol a week ago.  He denies withdrawal symptoms.  He reports SI with a plan to jump off a bridge and HI without a plan.  He verbally contracts for safety.  Pt reports auditory hallucinations that "tell me to go out and kill people and kill myself."  He denies visual hallucinations during assessment, but reports he saw a "shadow on the wall" when he was at the emergency department.  Pt reports de-escalation skills of reading, watching television, and being left alone.  He denies history of abuse.  Pt reports history of schizophrenia.  He reports previous suicide attempts "a few months ago and a couple years ago."   A: Introduced self to pt.  Admission process and paperwork completed with pt.  He was calm and cooperative during admission assessment.  Non-invasive body assessment completed.  Pt has scar on his left arm.  Belongings searched for contraband and items not allowed on the unit are in locker 15.  Pt oriented to unit.  Encouragement and support offered.  Medication administered per order.  PRN medication administered for sleep.   R: Pt is safe on the unit.  He reports that he will notify staff of needs and concerns.  Pt verbally contracts for safety.  Will continue to monitor and assess.

## 2015-06-06 NOTE — Tx Team (Signed)
Initial Interdisciplinary Treatment Plan   PATIENT STRESSORS: Financial difficulties Loss of grandmother and grandfather Medication change or noncompliance Substance abuse   PATIENT STRENGTHS: Average or above average intelligence Communication skills   PROBLEM LIST: Problem List/Patient Goals Date to be addressed Date deferred Reason deferred Estimated date of resolution  "be able to focus better" 06/05/15     "understand the meaning of life; especially what's going on in my life"  06/05/15           SI 06/05/15     HI 06/05/15     hallucinations 06/05/15                        DISCHARGE CRITERIA:  Improved stabilization in mood, thinking, and/or behavior Reduction of life-threatening or endangering symptoms to within safe limits  PRELIMINARY DISCHARGE PLAN: Attend aftercare/continuing care group Placement in alternative living arrangements  PATIENT/FAMIILY INVOLVEMENT: This treatment plan has been presented to and reviewed with the patient, Bruce [name].  The patient and family have been given the opportunity to ask questions and make suggestions.  Bruce [name], Bruce [name] 06/06/2015, 12:13 AM

## 2015-06-06 NOTE — Progress Notes (Signed)
D:Pt c/o anxiety with voices to hurt himself and others. He has decreased sleep and a flat affect.  A:Offered support, encouragement and 15 minute checks. R:Pt contracts with staff for safety. Safety maintained on the unit.

## 2015-06-07 DIAGNOSIS — R45851 Suicidal ideations: Secondary | ICD-10-CM

## 2015-06-07 DIAGNOSIS — F489 Nonpsychotic mental disorder, unspecified: Secondary | ICD-10-CM

## 2015-06-07 DIAGNOSIS — R45850 Homicidal ideations: Secondary | ICD-10-CM

## 2015-06-07 DIAGNOSIS — R443 Hallucinations, unspecified: Secondary | ICD-10-CM

## 2015-06-07 LAB — LIPID PANEL
Cholesterol: 153 mg/dL (ref 0–200)
HDL: 47 mg/dL (ref >40)
LDL CALC: 95 mg/dL (ref 0–99)
Total CHOL/HDL Ratio: 3.3 RATIO
Triglycerides: 57 mg/dL (ref <150)
VLDL: 11 mg/dL (ref 0–40)

## 2015-06-07 LAB — TSH: TSH: 2.439 u[IU]/mL (ref 0.350–4.500)

## 2015-06-07 NOTE — [facility] Group Notes (Signed)
[facility] Group Notes:  (Nursing/MHT/Case Management/Adjunct)  Date:  06/07/2015  Time: 0915  Type of Therapy:  Nurse Education  Participation Level:  Minimal  Participation Quality:  Resistant  Affect:  Flat  Cognitive:  Alert  Insight:  Lacking  Engagement in Group:  Lacking  Modes of Intervention:  Activity, Clarification, Education, Socialization and Support  Summary of Progress/Problems:  Bruce [name] Bruce [name] 06/07/2015, 12:09 PM

## 2015-06-07 NOTE — Progress Notes (Signed)
[facility] Group Notes:  (Nursing/MHT/Case Management/Adjunct)  Date:  06/07/2015  Time:  10:23 PM  Type of Therapy:  Psychoeducational Skills  Participation Level:  Active  Participation Quality:  Appropriate  Affect:  Appropriate  Cognitive:  Appropriate  Insight:  Good  Engagement in Group:  Engaged  Modes of Intervention:  Education  Summary of Progress/Problems: Patient states that he had a good day overall since he was able to socialize with his peers. As a theme for the day, his coping skill is to try and "stay focused".    Bruce [name], Bruce [name] 06/07/2015, 10:23 PM

## 2015-06-07 NOTE — Progress Notes (Signed)
Writer spoke with patient 1:1 and he reports having had a good day and he plans to work on not worrying and stressing so much over things he has no control over. He has been up in the dayroom watching tv with minimal interaction with peers. He reports passive si and verbally contracts for safety, he also reports hearing voices, denies hi/visual hallucinations. Writer informed him of medications schedule for tonight and he is agreeable to taking them. He reports that he has no support and that his mother is in an assisted living facility and she has schizophrenia also. Support and encouragement given, safety maintained on unit with 15 min checks.

## 2015-06-07 NOTE — Plan of Care (Signed)
Problem: Diagnosis: Increased Risk For Suicide Attempt Goal: STG-Patient Will Comply With Medication Regime Outcome: Progressing Pt has been compliant with medication regimen.     

## 2015-06-07 NOTE — [facility] Group Notes (Signed)
[facility] Group Notes: (Clinical Social Work)  06/07/2015 1:15-2:15PM  Summary of Progress/Problems: The main focus of today's process group was to discuss the differences between unhealthy coping techniques and healthy coping techniques. The group discussed the difficulty of learning new ways of dealing with hard situations, when they have been accustomed to using the unhealthy coping for so long. The patient shared healthy coping techniques he needs to learn include methods to "stop worrying."  During group his legs jiggled up and down more and more frequently, and CSW asked re his anxiety level, which was 10/10.  We then did a relaxation technique and for a few moments it worked, and his legs stopped, then resumed.  CSW encouraged him to talk to his RN about whether he had any PRN medications available, which he did immediately following group.  Type of Therapy: Group Therapy - Process   Participation Level: Active  Participation Quality:  Attentive  Affect:  Anxious and Flat  Cognitive:  Oriented  Insight:  Developing/Improving  Engagement in Therapy:  Developing/Improving  Modes of Intervention:  Education, Motivational Interviewing  Ambrose MantleMareida Grossman-Orr, LCSW 06/07/2015, 4:00 PM

## 2015-06-07 NOTE — Progress Notes (Signed)
[facility] MD Progress Note  06/07/2015 4:53 PM Bruce [name]  MRN:  [id number] Subjective:  "I'm doing OK. I just wish these voices would stop."   Objective: Pt seen and chart reviewed. Pt is alert/oriented x4, depressed, cooperative, and appropriate. Pt reports continued suicidal/homicidal ideation and psychosis and does not appear to be responding to internal stimuli. Pt cites poor sleep. Moderate yet improving appetite. Cites participation in groups but that they sometimes make the voices worse.   Principal Problem: Suicidal behavior Diagnosis:   Patient Active Problem List   Diagnosis Date Noted  . Suicidal behavior [F48.9] 06/05/2015  . Alcohol dependence (HCC) [F10.20] 11/20/2013  . 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 Depression   . Schizophrenia (HCC)    History reviewed. No pertinent past surgical history. Family History:  Family History  Problem Relation Age of Onset  . Schizophrenia Mother    Family Psychiatric  History: SEE H&P Social History:  History  Alcohol Use  . 16.8 oz/week  . 28 Cans of beer per week     History  Drug Use No    Social History   Social History  . Marital Status: Single    Spouse Name: N/A  . Number of Children: N/A  . Years of Education: N/A   Social History Main Topics  . Smoking status: Never Smoker   . Smokeless tobacco: None  . Alcohol Use: 16.8 oz/week    28 Cans of beer per week  . Drug Use: No  . Sexual Activity: Not Currently   Other Topics Concern  . None   Social History Narrative   Additional Social History:    Pain Medications: denies Prescriptions: denies Over the Counter: denies History of alcohol / drug use?: Yes Longest period of sobriety (when/how long): 2 weeks Negative Consequences of Use: Financial, Personal  relationships, Work / School Name of Substance 1: alcohol 1 - Age of First Use: 17 1 - Amount (size/oz): varies (7-9 drinks) 1 - Frequency: 4 or more times a week 1 - Duration: 15+ years 1 - Last Use / Amount: 1 week ago                  Sleep: Fair  Appetite:  Fair  Current Medications: Current Facility-Administered Medications  Medication Dose Route Frequency Provider Last Rate Last Dose  . acetaminophen (TYLENOL) tablet 650 mg  650 mg Oral Q4H PRN Adonis Brook, NP      . alum & mag hydroxide-simeth (MAALOX/MYLANTA) 200-200-20 MG/5ML suspension 30 mL  30 mL Oral Q4H PRN Adonis Brook, NP   30 mL at 06/06/15 2200  . benztropine (COGENTIN) tablet 0.5 mg  0.5 mg Oral BID Craige Cotta, MD   0.5 mg at 06/07/15 1644  . FLUoxetine (PROZAC) capsule 20 mg  20 mg Oral Daily Adonis Brook, NP   20 mg at 06/07/15 0754  . haloperidol (HALDOL) tablet 5 mg  5 mg Oral QHS Craige Cotta, MD   5 mg at 06/06/15 2157  . hydrOXYzine (ATARAX/VISTARIL) tablet 25 mg  25 mg Oral Q6H PRN Craige Cotta, MD   25 mg at 06/07/15 1442  . ibuprofen (ADVIL,MOTRIN) tablet 600 mg  600 mg Oral Q8H PRN Adonis Brook, NP      . loperamide (IMODIUM) capsule 2-4 mg  2-4 mg  Oral PRN Craige CottaFernando A Cobos, MD      . LORazepam (ATIVAN) tablet 1 mg  1 mg Oral Q6H PRN Rockey SituFernando A Cobos, MD      . magnesium hydroxide (MILK OF MAGNESIA) suspension 30 mL  30 mL Oral Daily PRN Adonis BrookSheila Agustin, NP      . multivitamin with minerals tablet 1 tablet  1 tablet Oral Daily Adonis BrookSheila Agustin, NP   1 tablet at 06/07/15 0754  . ondansetron (ZOFRAN-ODT) disintegrating tablet 4 mg  4 mg Oral Q6H PRN Craige CottaFernando A Cobos, MD      . thiamine (VITAMIN B-1) tablet 100 mg  100 mg Oral Daily Craige CottaFernando A Cobos, MD   100 mg at 06/07/15 0755  . traZODone (DESYREL) tablet 50 mg  50 mg Oral QHS PRN Craige CottaFernando A Cobos, MD   50 mg at 06/06/15 2157    Lab Results:  Results for orders placed or performed during the hospital encounter of 06/05/15 (from  the past 48 hour(s))  TSH     Status: None   Collection Time: 06/07/15  6:50 AM  Result Value Ref Range   TSH 2.439 0.350 - 4.500 uIU/mL    Comment: [facility]    Physical Findings: AIMS: Facial and Oral Movements Muscles of Facial Expression: None, normal Lips and Perioral Area: None, normal Jaw: None, normal Tongue: None, normal,Extremity Movements Upper (arms, wrists, hands, fingers): None, normal Lower (legs, knees, ankles, toes): None, normal, Trunk Movements Neck, shoulders, hips: None, normal, Overall Severity Severity of abnormal movements (highest score from questions above): None, normal Incapacitation due to abnormal movements: None, normal Patient's awareness of abnormal movements (rate only patient's report): No Awareness, Dental Status Current problems with teeth and/or dentures?: No Does patient usually wear dentures?: No  CIWA:  CIWA-Ar Total: 0 COWS:  COWS Total Score: 0  Musculoskeletal: Strength & Muscle Tone: within normal limits Gait & Station: normal Patient leans: N/A  Psychiatric Specialty Exam: Review of Systems  Psychiatric/Behavioral: Positive for depression, suicidal ideas and hallucinations. The patient is nervous/anxious and has insomnia.   All other systems reviewed and are negative.   Blood pressure 143/92, pulse 99, temperature 98.2 F (36.8 C), temperature source Oral, resp. rate 20, height 5\' 7"  (1.702 m), weight 158.759 kg (350 lb), SpO2 100 %.Body mass index is 54.8 kg/(m^2).  General Appearance: Casual and Fairly Groomed  Eye Contact::  Good  Speech:  Clear and Coherent and Normal Rate  Volume:  Decreased  Mood:  Anxious and Depressed  Affect:  Appropriate, Congruent and Depressed  Thought Process:  Circumstantial  Orientation:  Full (Time, Place, and Person)  Thought Content:  Rumination  Suicidal Thoughts:  Yes.  without intent/plan  Homicidal Thoughts:  Yes.  without intent/plan  Memory:   Immediate;   Fair Recent;   Fair Remote;   Fair  Judgement:  Fair  Insight:  Fair  Psychomotor Activity:  Normal  Concentration:  Good  Recall:  Good  Fund of Knowledge:Fair  Language: Fair  Akathisia:  No  Handed:    AIMS (if indicated):     Assets:  Communication Skills Desire for Improvement Physical Health Resilience Social Support  ADL's:  Intact  Cognition: WNL  Sleep:  Number of Hours: 6.5   Treatment Plan Summary: Daily contact with patient to assess and evaluate symptoms and progress in treatment and Medication management  Beau FannyWithrow, John C, FNP-BC 06/07/2015, 4:53 PM  I agree with assessment and plan Madie RenoIrving A. Dub MikesLugo, M.D.

## 2015-06-07 NOTE — Progress Notes (Signed)
D:Per patient self inventory form pt reports he slept good last night with the use of sleep medication. He reports a good appetite, low energy level, good concentration. He rates depression 7/10, hopelessness 8/10, anxiety 6/10- all on 0-10 scale, 10 being the worse. He denies S/S of withdrawal. He endorses passive SI. Denies HI at this time. Denies AVH at this time. He denies physical pain. Pt reports his goal for the day is "less stressful" and that he will "stay focus" to help him meet his goal. Flat affect. Behavior calm, depressed. Attended nursing group on the unit, minimal participation.   A:Special checks q 15 mins in place for safety. Medication administered per MD order (see eMAR). Pt verbally contracts for safety. Pt denies a plan to hurt self and feels comfortable speaking with nursing staff. Encouragement and support provided.  R: Safety maintained. Compliant with medication regimen. Will continue to monitor.

## 2015-06-08 MED ORDER — OLANZAPINE 5 MG PO TBDP
5.0000 mg | ORAL_TABLET | Freq: Three times a day (TID) | ORAL | Status: DC | PRN
  Administered 2015-06-09: 5 mg via ORAL
  Filled 2015-06-08: qty 1

## 2015-06-08 NOTE — Progress Notes (Signed)
[facility] Group Notes:  (Nursing/MHT/Case Management/Adjunct)  Date:  06/08/2015  Time:  11:17 PM  Type of Therapy:  Psychoeducational Skills  Participation Level:  Minimal  Participation Quality:  Inattentive  Affect:  Flat  Cognitive:  Lacking  Insight:  Lacking  Engagement in Group:  Limited  Modes of Intervention:  Education  Summary of Progress/Problems: The patient had little to share with the group except to say that he had a "so so". The patient was unable to come up with a support system for the group (theme of the day).    Yug Loria S 06/08/2015, 11:17 PM

## 2015-06-08 NOTE — Progress Notes (Signed)
D) Pt came out of group and came to this writer stating "I am having bad thoughts and they just keep on and on. I am having thoughts of hurting myself or hurting someone else'. Pt went on to explain that he is hearing voices that are telling him to kill himself and to hurt others. Affect was fearful. Mood anxious. A) Verbal contract made with Pt for his safety and the safety of others. Pt was given a prn of Ativan 1 mg for his increased voices and anxiety. Provided Pt with a 1:1 to let him know that staff is available to him at any time. Pt praised for coming to staff for help R) Pt taking the medication. States "I am not going to listen to the voices. I will comes to you if I need to"

## 2015-06-08 NOTE — [facility] Group Notes (Signed)
[facility] Group Notes:  (Nursing/MHT/Case Management/Adjunct)  Date:  06/08/2015  Time:  3:37 PM    Bethann PunchesJane O Denita Lun 06/08/2015, 3:37 PM Type of Therapy:  Psychoeducational Skills  Participation Level:  Active  Participation Quality:  Appropriate  Affect:  Appropriate  Cognitive:  Appropriate  Insight:  Appropriate  Engagement in Group:  Engaged  Modes of Intervention:  Problem-solving  Summary of Progress/Problems: Pt did not participate

## 2015-06-08 NOTE — Progress Notes (Signed)
[facility] MD Progress Note  06/08/2015  Bruce [name]  MRN:  [id number] Subjective:  "I'm doing slightly better, but I felt terrible in group like the voices just got worse all of a sudden telling me to kill myself and others."   Objective: Pt seen and chart reviewed. Pt is alert/oriented x4, depressed, cooperative, and appropriate. Pt reports continued suicidal/homicidal ideation and psychosis and does not appear to be responding to internal stimuli. Pt cites improving sleep. Moderate yet improving appetite. Cites participation in groups but that they sometimes make the voices worse.   Today, pt presents as very agitated and upset, reporting that the voices continue to tell him negative things.  Principal Problem: Schizophrenia, residual, subchronic, with acute exacerbation (HCC) Diagnosis:   Patient Active Problem List   Diagnosis Date Noted  . 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 Suicidal behavior [F48.9] 06/05/2015    Priority: Medium  . Alcohol dependence (HCC) [F10.20] 11/20/2013   Total Time spent with patient: 15 minutes  Past Psychiatric History: SEE H&P  Past Medical History:  Past Medical History  Diagnosis Date  . Depression   . Schizophrenia (HCC)    History reviewed. No pertinent past surgical history. Family History:  Family History  Problem Relation Age of Onset  . Schizophrenia Mother    Family Psychiatric  History: SEE H&P Social History:  History  Alcohol Use  . 16.8 oz/week  . 28 Cans of beer per week     History  Drug Use No    Social History   Social History  . Marital Status: Single    Spouse Name: N/A  . Number of Children: N/A  . Years of Education: N/A   Social History Main Topics  . Smoking status: Never Smoker   . Smokeless tobacco: None  . Alcohol Use: 16.8 oz/week    28 Cans of beer per week  . Drug Use: No  . Sexual Activity: Not Currently   Other Topics Concern   . None   Social History Narrative   Additional Social History:    Pain Medications: denies Prescriptions: denies Over the Counter: denies History of alcohol / drug use?: Yes Longest period of sobriety (when/how long): 2 weeks Negative Consequences of Use: Financial, Personal relationships, Work / School Name of Substance 1: alcohol 1 - Age of First Use: 17 1 - Amount (size/oz): varies (7-9 drinks) 1 - Frequency: 4 or more times a week 1 - Duration: 15+ years 1 - Last Use / Amount: 1 week ago                  Sleep: Fair  Appetite:  Fair  Current Medications: Current Facility-Administered Medications  Medication Dose Route Frequency Provider Last Rate Last Dose  . acetaminophen (TYLENOL) tablet 650 mg  650 mg Oral Q4H PRN Adonis Brook, NP      . alum & mag hydroxide-simeth (MAALOX/MYLANTA) 200-200-20 MG/5ML suspension 30 mL  30 mL Oral Q4H PRN Adonis Brook, NP   30 mL at 06/06/15 2200  . benztropine (COGENTIN) tablet 0.5 mg  0.5 mg Oral BID Craige Cotta, MD   0.5 mg at 06/08/15 0811  . FLUoxetine (PROZAC) capsule 20 mg  20 mg Oral Daily Adonis Brook, NP   20 mg at 06/08/15 0811  . haloperidol (HALDOL) tablet 5 mg  5 mg Oral QHS Craige Cotta, MD   5 mg at 06/07/15 2052  . hydrOXYzine (ATARAX/VISTARIL)  tablet 25 mg  25 mg Oral Q6H PRN Craige CottaFernando A Cobos, MD   25 mg at 06/07/15 1442  . ibuprofen (ADVIL,MOTRIN) tablet 600 mg  600 mg Oral Q8H PRN Adonis BrookSheila Agustin, NP      . loperamide (IMODIUM) capsule 2-4 mg  2-4 mg Oral PRN Craige CottaFernando A Cobos, MD      . LORazepam (ATIVAN) tablet 1 mg  1 mg Oral Q6H PRN Craige CottaFernando A Cobos, MD   1 mg at 06/08/15 1112  . magnesium hydroxide (MILK OF MAGNESIA) suspension 30 mL  30 mL Oral Daily PRN Adonis BrookSheila Agustin, NP      . multivitamin with minerals tablet 1 tablet  1 tablet Oral Daily Adonis BrookSheila Agustin, NP   1 tablet at 06/08/15 [phone]  . OLANZapine zydis (ZYPREXA) disintegrating tablet 5 mg  5 mg Oral Q8H PRN Beau FannyJohn C Withrow, FNP      .  ondansetron (ZOFRAN-ODT) disintegrating tablet 4 mg  4 mg Oral Q6H PRN Rockey SituFernando A Cobos, MD      . thiamine (VITAMIN B-1) tablet 100 mg  100 mg Oral Daily Craige CottaFernando A Cobos, MD   100 mg at 06/08/15 0811  . traZODone (DESYREL) tablet 50 mg  50 mg Oral QHS PRN Craige CottaFernando A Cobos, MD   50 mg at 06/07/15 2052    Lab Results:  Results for orders placed or performed during the hospital encounter of 06/05/15 (from the past 48 hour(s))  Lipid panel     Status: None   Collection Time: 06/07/15  6:50 AM  Result Value Ref Range   Cholesterol 153 0 - 200 mg/dL   Triglycerides 57 <960<150 mg/dL   HDL 47 >45>40 mg/dL   Total CHOL/HDL Ratio 3.3 RATIO   VLDL 11 0 - 40 mg/dL   LDL Cholesterol 95 0 - 99 mg/dL    Comment:        Total Cholesterol/HDL:CHD Risk Coronary Heart Disease Risk Table                     Men   Women  1/2 Average Risk   3.4   3.3  Average Risk       5.0   4.4  2 X Average Risk   9.6   7.1  3 X Average Risk  23.4   11.0        Use the calculated Patient Ratio above and the CHD Risk Table to determine the patient's CHD Risk.        ATP III CLASSIFICATION (LDL):  <100     mg/dL   Optimal  [id number]  mg/dL   Near or Above                    Optimal  130-159  mg/dL   Borderline  [id number]  mg/dL   High  >956>190     mg/dL   Very High Performed [facility]   TSH     Status: None   Collection Time: 06/07/15  6:50 AM  Result Value Ref Range   TSH 2.439 0.350 - 4.500 uIU/mL    Comment: Performed at [facility]    Physical Findings: AIMS: Facial and Oral Movements Muscles of Facial Expression: None, normal Lips and Perioral Area: None, normal Jaw: None, normal Tongue: None, normal,Extremity Movements Upper (arms, wrists, hands, fingers): None, normal Lower (legs, knees, ankles, toes): None, normal, Trunk Movements Neck, shoulders, hips: None, normal, Overall Severity Severity of abnormal movements (  highest score from questions above): None,  normal Incapacitation due to abnormal movements: None, normal Patient's awareness of abnormal movements (rate only patient's report): No Awareness, Dental Status Current problems with teeth and/or dentures?: No Does patient usually wear dentures?: No  CIWA:  CIWA-Ar Total: 0 COWS:  COWS Total Score: 0  Musculoskeletal: Strength & Muscle Tone: within normal limits Gait & Station: normal Patient leans: N/A  Psychiatric Specialty Exam: Review of Systems  Psychiatric/Behavioral: Positive for depression, suicidal ideas and hallucinations. The patient is nervous/anxious and has insomnia.   All other systems reviewed and are negative.   Blood pressure 114/63, pulse 96, temperature 98.4 F (36.9 C), temperature source Oral, resp. rate 18, height  (1.702 m), weight 158.759 kg (350 lb), SpO2 100 %.Body mass index is 54.8 kg/(m^2).  General Appearance: Casual and Fairly Groomed  Eye Contact::  Good  Speech:  Clear and Coherent and Normal Rate  Volume:  Decreased  Mood:  Anxious and Depressed  Affect:  Appropriate, Congruent and Depressed  Thought Process:  Circumstantial  Orientation:  Full (Time, Place, and Person)  Thought Content:  Rumination  Suicidal Thoughts:  Yes.  without intent/plan  Homicidal Thoughts:  Yes.  without intent/plan  Memory:  Immediate;   Fair Recent;   Fair Remote;   Fair  Judgement:  Fair  Insight:  Fair  Psychomotor Activity:  Normal  Concentration:  Good  Recall:  Good  Fund of Knowledge:Fair  Language: Fair  Akathisia:  No  Handed:    AIMS (if indicated):     Assets:  Communication Skills Desire for Improvement Physical Health Resilience Social Support  ADL's:  Intact  Cognition: WNL  Sleep:  Number of Hours: 6.25   Treatment Plan Summary: Daily contact with patient to assess and evaluate symptoms and progress in treatment and Medication management  Beau Fanny, FNP-BC 06/08/2015, 10:46 AM I agree with assessment and plan Madie Reno A.  Dub Mikes, M.D.

## 2015-06-08 NOTE — Plan of Care (Signed)
Problem: Diagnosis: Increased Risk For Suicide Attempt Goal: LTG-Patient Will Report Improved Mood and Deny Suicidal LTG (by discharge) Patient will report improved mood and deny suicidal ideation.  Outcome: Progressing Pt still reports passive SI, but verbally contracts for safety.

## 2015-06-08 NOTE — [facility] Group Notes (Signed)
[facility] Group Notes:  (Nursing/MHT/Case Management/Adjunct)  Date:  06/08/2015  Time:  0945  Type of Therapy:  Nurse Education/  :  Healthy support systems: The group is focused on teaching  pts the importance of developng and utilizing healthy support systems.  Participation Level:  Did Not Attend  Participation Quality:    Affect:    Cognitive:    Insight:    Engagement in Group:    Modes of Intervention:    Summary of Progress/Problems:  Rich BraveDuke, Avanthika Dehnert Lynn 06/08/2015, 3:45 PM

## 2015-06-08 NOTE — Progress Notes (Signed)
D: Pt continues to be very flat and depressed on the unit today. Pt also continues to be very isolative. Pt attended the group meeting this morning. Pt was been visible in the milieu, but not interactive. Pt has been cooperative with taking his medications. Pt reported that his depression was a 7, his hopelessness was a 7, and that his anxiety was a 0. Pt reported being negative SI/HI, no AH/VH noted. A: 15 min checks continued for patient safety. R: Pts safety maintained.

## 2015-06-08 NOTE — Plan of Care (Signed)
Problem: Diagnosis: Increased Risk For Suicide Attempt Goal: STG-Patient Will Attend All Groups On The Unit Outcome: Progressing Patient attended evening wrap up group on 06/07/2015.

## 2015-06-08 NOTE — [facility] Group Notes (Signed)
[facility] Group Notes: (Clinical Social Work)  06/08/2015 1:15-2:15PM  Summary of Progress/Problems: The main focus of today's process group was to  1) discuss the importance of adding supports 2) identify the patient's current healthy supports  3) do an exercise to increase connection with and therefore empathy for others  An emphasis was placed on using problem-specific support groups to expand supports.   The patient was late to group, did not talk, but listened and did not appear to be as anxious as yesterday.  Type of Therapy: Process Group with Motivational Interviewing  Participation Level: Active  Participation Quality: Attentive  Affect: Blunted   Cognitive: Appropriate  Insight: Improving  Engagement in Therapy: Improving  Modes of Intervention: Education, Support and Processing, Activity  Ambrose MantleMareida Grossman-Orr, LCSW 06/08/2015

## 2015-06-09 LAB — HEMOGLOBIN A1C
HEMOGLOBIN A1C: 5.2 % (ref 4.8–5.6)
Mean Plasma Glucose: 103 mg/dL

## 2015-06-09 MED ORDER — HALOPERIDOL 5 MG PO TABS
5.0000 mg | ORAL_TABLET | Freq: Two times a day (BID) | ORAL | Status: DC
  Administered 2015-06-09 – 2015-06-12 (×7): 5 mg via ORAL
  Filled 2015-06-09 (×12): qty 1

## 2015-06-09 NOTE — Plan of Care (Signed)
Problem: Consults Goal: Depression Patient Education See Patient Education Module for education specifics.  Outcome: Progressing Nurse discussed depression/coping skills with patient.        

## 2015-06-09 NOTE — Plan of Care (Signed)
Problem: Diagnosis: Increased Risk For Suicide Attempt Goal: STG-Patient Will Report Suicidal Feelings to Staff Outcome: Progressing Patient reported to writer that he had to leave group earlier today d/t hearing voices to hurt himself or anyone else. He requested medication during this incident.

## 2015-06-09 NOTE — Progress Notes (Signed)
Adult Psychoeducational Group Note  Date:  06/09/2015 Time:  8: 35 PM  Group Topic/Focus:  Wrap-Up Group:   The focus of this group is to help patients review their daily goal of treatment and discuss progress on daily workbooks.  Participation Level:  Active  Participation Quality:  Appropriate  Affect:  Appropriate  Cognitive:  Appropriate  Insight: Good  Engagement in Group:  Engaged  Modes of Intervention:  Discussion  Additional Comments:  Pt rated overall day an 8 out of 10 because he felt his depression lifting and he is starting to feel better. Pt reported that his goal for the day was to be less stressed, which he feels that he did not achieve.   Cleotilde NeerJasmine S Powell Halbert 06/09/2015, 9:53 PM

## 2015-06-09 NOTE — [facility] Group Notes (Signed)
[facility] LCSW Group Therapy  06/09/2015 1:15pm  Type of Therapy:  Group Therapy vercoming Obstacles  Participation Level:  Minimal  Participation Quality:  N/A  Affect:  Flat  Cognitive:  Appropriate and Oriented  Insight:  Developing/Improving and Improving  Engagement in Therapy:  Minimal  Modes of Intervention:  Discussion, Exploration, Problem-solving and Support  Description of Group:   In this group patients will be encouraged to explore what they see as obstacles to their own wellness and recovery. They will be guided to discuss their thoughts, feelings, and behaviors related to these obstacles. The group will process together ways to cope with barriers, with attention given to specific choices patients can make. Each patient will be challenged to identify changes they are motivated to make in order to overcome their obstacles. This group will be process-oriented, with patients participating in exploration of their own experiences as well as giving and receiving support and challenge from other group members.  Summary of Patient Progress: Pt did not participate in group discussion.   Therapeutic Modalities:   Cognitive Behavioral Therapy Solution Focused Therapy Motivational Interviewing Relapse Prevention Therapy   Chad CordialLauren Carter, LCSWA 06/09/2015 5:19 PM

## 2015-06-09 NOTE — Progress Notes (Signed)
Patient ID: Bruce [name], male   DOB: 01-May-1976, 39 y.o.   MRN: [id number] [facility] MD Progress Note  06/09/2015  Daved Mcfann  MRN:  [id number] Subjective:  Patient reports he is still hallucinating, reports hearing voices " telling me to hurt myself and others ". Denies medication side effects.    Objective: Pt seen and  And case discussed with staff . Although patient reports ongoing hallucinations and depression, he presents significantly improved compared to admission- he is pleasant, cooperative, not agitated, not presenting with irritability or anger, and is exhibiting a much improved range of affect.  He does not appear internally preoccupied, is not thought disordered at present and is not expressing any delusional thoughts at this time. He has been visible on unit, and behavior has been calm and in good control. He denies medication side effects and at this time is tolerating Haldol well - no akathisia, no dystonia, no EPS , no cog-wheeling noted .   Principal Problem: Schizophrenia, residual, subchronic, with acute exacerbation (HCC) Diagnosis:   Patient Active Problem List   Diagnosis Date Noted  . Suicidal behavior [F48.9] 06/05/2015  . Alcohol dependence (HCC) [F10.20] 11/20/2013  . Schizophrenia, residual, subchronic, with acute exacerbation (HCC) [F20.5] 11/18/2013   Total Time spent with patient:  20 minutes   Past Psychiatric History: SEE H&P  Past Medical History:  Past Medical History  Diagnosis Date  . Depression   . Schizophrenia (HCC)    History reviewed. No pertinent past surgical history. Family History:  Family History  Problem Relation Age of Onset  . Schizophrenia Mother    Family Psychiatric  History: SEE H&P Social History:  History  Alcohol Use  . 16.8 oz/week  . 28 Cans of beer per week     History  Drug Use No    Social History   Social History  . Marital Status: Single    Spouse Name: N/A  . Number of Children: N/A  . Years of  Education: N/A   Social History Main Topics  . Smoking status: Never Smoker   . Smokeless tobacco: None  . Alcohol Use: 16.8 oz/week    28 Cans of beer per week  . Drug Use: No  . Sexual Activity: Not Currently   Other Topics Concern  . None   Social History Narrative   Additional Social History:    Pain Medications: denies Prescriptions: denies Over the Counter: denies History of alcohol / drug use?: Yes Longest period of sobriety (when/how long): 2 weeks Negative Consequences of Use: Financial, Personal relationships, Work / School Name of Substance 1: alcohol 1 - Age of First Use: 17 1 - Amount (size/oz): varies (7-9 drinks) 1 - Frequency: 4 or more times a week 1 - Duration: 15+ years 1 - Last Use / Amount: 1 week ago  Sleep:  Improved   Appetite:   Improved   Current Medications: Current Facility-Administered Medications  Medication Dose Route Frequency Provider Last Rate Last Dose  . acetaminophen (TYLENOL) tablet 650 mg  650 mg Oral Q4H PRN Adonis Brook, NP      . alum & mag hydroxide-simeth (MAALOX/MYLANTA) 200-200-20 MG/5ML suspension 30 mL  30 mL Oral Q4H PRN Adonis Brook, NP   30 mL at 06/06/15 2200  . benztropine (COGENTIN) tablet 0.5 mg  0.5 mg Oral BID Craige Cotta, MD   0.5 mg at 06/09/15 1611  . FLUoxetine (PROZAC) capsule 20 mg  20 mg Oral Daily Adonis Brook, NP   20 mg  at 06/09/15 1308  . haloperidol (HALDOL) tablet 5 mg  5 mg Oral BID Craige Cotta, MD   5 mg at 06/09/15 1720  . ibuprofen (ADVIL,MOTRIN) tablet 600 mg  600 mg Oral Q8H PRN Adonis Brook, NP      . magnesium hydroxide (MILK OF MAGNESIA) suspension 30 mL  30 mL Oral Daily PRN Adonis Brook, NP      . multivitamin with minerals tablet 1 tablet  1 tablet Oral Daily Adonis Brook, NP   1 tablet at 06/09/15 6578  . OLANZapine zydis (ZYPREXA) disintegrating tablet 5 mg  5 mg Oral Q8H PRN Beau Fanny, FNP   5 mg at 06/09/15 1507  . thiamine (VITAMIN B-1) tablet 100 mg  100 mg  Oral Daily Craige Cotta, MD   100 mg at 06/09/15 4696  . traZODone (DESYREL) tablet 50 mg  50 mg Oral QHS PRN Craige Cotta, MD   50 mg at 06/08/15 2110    Lab Results:  No results found for this or any previous visit (from the past 48 hour(s)).  Physical Findings: AIMS: Facial and Oral Movements Muscles of Facial Expression: None, normal Lips and Perioral Area: None, normal Jaw: None, normal Tongue: None, normal,Extremity Movements Upper (arms, wrists, hands, fingers): None, normal Lower (legs, knees, ankles, toes): None, normal, Trunk Movements Neck, shoulders, hips: None, normal, Overall Severity Severity of abnormal movements (highest score from questions above): None, normal Incapacitation due to abnormal movements: None, normal Patient's awareness of abnormal movements (rate only patient's report): No Awareness, Dental Status Current problems with teeth and/or dentures?: No Does patient usually wear dentures?: No  CIWA:  CIWA-Ar Total: 1 COWS:  COWS Total Score: 1  Musculoskeletal: Strength & Muscle Tone: within normal limits Gait & Station: normal Patient leans: N/A  Psychiatric Specialty Exam: Review of Systems  Psychiatric/Behavioral: Positive for depression, suicidal ideas and hallucinations. The patient is nervous/anxious and has insomnia.   All other systems reviewed and are negative. denies chest pain, denies shortness of breath, no vomiting, does not report dystonia or abnormal muscle movements  Blood pressure 99/57, pulse 76, temperature 99.2 F (37.3 C), temperature source Oral, resp. rate 18, height  (1.702 m), weight 350 lb (158.759 kg), SpO2 100 %.Body mass index is 54.8 kg/(m^2).  General Appearance: Fairly Groomed- improved compared to admission  Eye Contact::  Good  Speech:  Normal Rate  Volume:  Normal  Mood:  Reports still feels depressed   Affect:   More reactive, today pleasant, smiling often, appropriately  Thought Process:   Circumstantial and Linear  Orientation:  Full (Time, Place, and Person)  Thought Content:   Reports hallucinations ( auditory ) - not internally preoccupied at this time - no delusions expressed   Suicidal Thoughts:  No- at this time denies any plan or intention of hurting himself or of SI, and denies any plan or intention of violence towards anyone, contracts for safety on unit   Homicidal Thoughts:  No  Memory:   Recent and remote grossly intact   Judgement:  Fair  Insight:  Fair  Psychomotor Activity:  Normal  Concentration:  Good  Recall:  Good  Fund of Knowledge:Good  Language: Good  Akathisia:  No  Handed:    AIMS (if indicated):     Assets:  Communication Skills Desire for Improvement Physical Health Resilience Social Support  ADL's:  Intact  Cognition: WNL  Sleep:  Number of Hours: 5.75  Assessment - at this time patient  reports ongoing symptoms of depression and of psychosis, and reports voices telling him to hurt self and others. He presents improved compared to admission, however, with a brighter affect, improved grooming, improved relatedness and eye contact, and at this time is tolerating medications well- of note, his behavior on unit has been calm and in good control and he denies any plan or intention fo hurting self or anyone else. He is tolerating medications well and is not presenting with any dystonia from Haldol .   Treatment Plan Summary: Daily contact with patient to assess and evaluate symptoms and progress in treatment and Medication management Continue Prozac 20 mgrs QAM  For deprssion Continue Cogentin 0.5 mgrs BID to minimize risk of dystonic reactions Increase Haldol to 5 mgrs BID to address ongoing psychotic symptoms- medication side effects reviewed Encourage ongoing milieu participation to work on coping skills and symptom reduction Continue Trazodone 50 mgrs QHS PRN for Insomnia, as needed  Nehemiah MassedOBOS, FERNANDO,  MD  06/09/2015, 10:46 AM

## 2015-06-09 NOTE — Progress Notes (Signed)
Recreation Therapy Notes  Date: 10.17.2016 Time: 9:30am Location: [address] Day[address]   Group Topic: Stress Management  Goal Area(s) Addresses:  Patient will actively participate in stress management techniques presented during session.   Behavioral Response: Did not attend.   Marykay Lexenise L Jamari Diana, LRT/CTRS  Shaundra Fullam L 06/09/2015 1:59 PM

## 2015-06-09 NOTE — [facility] Group Notes (Signed)
[facility]  06/09/2015 8:45 AM  Participation Quality: Alert, Appropriate and Oriented  Mood/Affect: Flat  Depression Rating: 9  Anxiety Rating: 7  Thoughts of Suicide: Pt endorses SI/HI (general)  Will you contract for safety? Yes  Current AVH: Pt denies  Plan for Discharge/Comments: Pt attended discharge planning group and actively participated in group. CSW discussed suicide prevention education with the group and encouraged them to discuss discharge planning and any relevant barriers. Pt presents with flat affect and depressed mood; low volume of speech. He reports that he is not feeling well today.  Transportation Means: Pt reports access to transportation  Supports: No supports mentioned at this time  Chad CordialLauren Carter, LCSWA 06/09/2015 9:39 AM

## 2015-06-09 NOTE — Progress Notes (Signed)
D:  Patient's self inventory sheet, patient has fair sleep, sleep medication is helpful.  Good appetite, low energy level, good concentration.  Rated depression and hopeless 7, anxiety 6.  Denied withdrawals.  SI, almost all the time, contracts for safety.  Denied physical pain.  Denied physical problems.  Goal is to be less stressful.  Medications are not working, please change or up the dose.  Plans to focus more.  Continues to hear voices.  Does have discharge plans.  No problems anticipated after discharge. A:  Medications administered per MD orders.  Emotional support and encouragement given patient. R:  SI and HI continues, contracts for safety.  Voices continue to tell him to kill himself and others.  Denied visual hallucinations.  Denied pain.  Safety maintained with 15 minute checks.

## 2015-06-10 MED ORDER — IBUPROFEN 600 MG PO TABS
600.0000 mg | ORAL_TABLET | Freq: Three times a day (TID) | ORAL | Status: DC | PRN
  Administered 2015-06-10 – 2015-06-11 (×2): 600 mg via ORAL
  Filled 2015-06-10 (×2): qty 1

## 2015-06-10 NOTE — [facility] Group Notes (Signed)
[facility] LCSW Group Therapy 06/10/2015 1:15 PM  Type of Therapy: Group Therapy- Feelings about Diagnosis  Participation Level: Minimal  Participation Quality:  N/A  Affect:  Flat  Cognitive: Alert and Oriented   Insight:  Developing   Engagement in Therapy: Limited   Modes of Intervention: Clarification, Confrontation, Discussion, Education, Exploration, Limit-setting, Orientation, Problem-solving, Rapport Building, Dance movement psychotherapisteality Testing, Socialization and Support  Description of Group:   This group will allow patients to explore their thoughts and feelings about diagnoses they have received. Patients will be guided to explore their level of understanding and acceptance of these diagnoses. Facilitator will encourage patients to process their thoughts and feelings about the reactions of others to their diagnosis, and will guide patients in identifying ways to discuss their diagnosis with significant others in their lives. This group will be process-oriented, with patients participating in exploration of their own experiences as well as giving and receiving support and challenge from other group members.  Summary of Progress/Problems:  Pt continues to not participate in group therapy. He is withdrawn, maintains minimal eye contact, and speaks in barely audible tone.  Therapeutic Modalities:   Cognitive Behavioral Therapy Solution Focused Therapy Motivational Interviewing Relapse Prevention Therapy  Chad CordialLauren Carter, LCSWA 06/10/2015 3:59 PM

## 2015-06-10 NOTE — Progress Notes (Signed)
Patient ID: Bruce [name], male   DOB: [birth year], 39 y.o.   MRN: [id number] [facility] MD Progress Note  06/10/2015  Faust Thorington  MRN:  [id number] Subjective:  Patient reports  Hallucinations are still present but have decreased compared to recent. He states he still feels intermittently depressed. At this time denies any active suicidal ideations, and he presents more future oriented, wanting to find out more about [employer] as a possible disposition plan. He has tolerated medications well, and does not endorse any  medication side effects.  No akathisia, no dystonic or EPS side effects from Haldol titration.   Objective: Pt seen and  And case discussed with staff . Patient improved compared to admission- reports ongoing hallucinations telling him to hurt himself, but acknowledges these are better and less significant . He does  Not appear internally preoccupied or paranoid at this time. He has been visible on unit, interacting appropriately with selected peers .  No disruptive or agitated behaviors noted or reported .    Principal Problem: Schizophrenia, residual, subchronic, with acute exacerbation (HCC) Diagnosis:   Patient Active Problem List   Diagnosis Date Noted  . Suicidal behavior [F48.9] 06/05/2015  . Alcohol dependence (HCC) [F10.20] 11/20/2013  . Schizophrenia, residual, subchronic, with acute exacerbation (HCC) [F20.5] 11/18/2013   Total Time spent with patient:  20 minutes   Past Psychiatric History: SEE H&P  Past Medical History:  Past Medical History  Diagnosis Date  . Depression   . Schizophrenia (HCC)    History reviewed. No pertinent past surgical history. Family History:  Family History  Problem Relation Age of Onset  . Schizophrenia Mother    Family Psychiatric  History: SEE H&P Social History:  History  Alcohol Use  . 16.8 oz/week  . 28 Cans of beer per week     History  Drug Use No    Social History   Social History  . Marital Status:  Single    Spouse Name: N/A  . Number of Children: N/A  . Years of Education: N/A   Social History Main Topics  . Smoking status: Never Smoker   . Smokeless tobacco: None  . Alcohol Use: 16.8 oz/week    28 Cans of beer per week  . Drug Use: No  . Sexual Activity: Not Currently   Other Topics Concern  . None   Social History Narrative   Additional Social History:    Pain Medications: denies Prescriptions: denies Over the Counter: denies History of alcohol / drug use?: Yes Longest period of sobriety (when/how long): 2 weeks Negative Consequences of Use: Financial, Personal relationships, Work / School Name of Substance 1: alcohol 1 - Age of First Use: 17 1 - Amount (size/oz): varies (7-9 drinks) 1 - Frequency: 4 or more times a week 1 - Duration: 15+ years 1 - Last Use / Amount: 1 week ago  Sleep:  Improved   Appetite:   Improved   Current Medications: Current Facility-Administered Medications  Medication Dose Route Frequency Provider Last Rate Last Dose  . acetaminophen (TYLENOL) tablet 650 mg  650 mg Oral Q4H PRN Adonis Brook, NP      . alum & mag hydroxide-simeth (MAALOX/MYLANTA) 200-200-20 MG/5ML suspension 30 mL  30 mL Oral Q4H PRN Adonis Brook, NP   30 mL at 06/06/15 2200  . benztropine (COGENTIN) tablet 0.5 mg  0.5 mg Oral BID Craige Cotta, MD   0.5 mg at 06/10/15 0820  . FLUoxetine (PROZAC) capsule 20 mg  20  mg Oral Daily Adonis Brook, NP   20 mg at 06/10/15 0820  . haloperidol (HALDOL) tablet 5 mg  5 mg Oral BID Craige Cotta, MD   5 mg at 06/10/15 0820  . ibuprofen (ADVIL,MOTRIN) tablet 600 mg  600 mg Oral Q8H PRN Rockey Situ Aasha Dina, MD      . magnesium hydroxide (MILK OF MAGNESIA) suspension 30 mL  30 mL Oral Daily PRN Adonis Brook, NP      . multivitamin with minerals tablet 1 tablet  1 tablet Oral Daily Adonis Brook, NP   1 tablet at 06/10/15 0820  . thiamine (VITAMIN B-1) tablet 100 mg  100 mg Oral Daily Craige Cotta, MD   100 mg at  06/10/15 0820  . traZODone (DESYREL) tablet 50 mg  50 mg Oral QHS PRN Craige Cotta, MD   50 mg at 06/09/15 2118    Lab Results:  No results found for this or any previous visit (from the past 48 hour(s)).  Physical Findings: AIMS: Facial and Oral Movements Muscles of Facial Expression: None, normal Lips and Perioral Area: None, normal Jaw: None, normal Tongue: None, normal,Extremity Movements Upper (arms, wrists, hands, fingers): None, normal Lower (legs, knees, ankles, toes): None, normal, Trunk Movements Neck, shoulders, hips: None, normal, Overall Severity Severity of abnormal movements (highest score from questions above): None, normal Incapacitation due to abnormal movements: None, normal Patient's awareness of abnormal movements (rate only patient's report): No Awareness, Dental Status Current problems with teeth and/or dentures?: No Does patient usually wear dentures?: No  CIWA:  CIWA-Ar Total: 2 COWS:  COWS Total Score: 1  Musculoskeletal: Strength & Muscle Tone: within normal limits Gait & Station: normal Patient leans: N/A  Psychiatric Specialty Exam: Review of Systems  Psychiatric/Behavioral: Positive for depression, suicidal ideas and hallucinations. The patient is nervous/anxious and has insomnia.   All other systems reviewed and are negative. denies chest pain, denies shortness of breath, no vomiting, does not report dystonia or abnormal muscle movements  Blood pressure 117/87, pulse 71, temperature 98.1 F (36.7 C), temperature source Oral, resp. rate 18, height  (1.702 m), weight 350 lb (158.759 kg), SpO2 100 %.Body mass index is 54.8 kg/(m^2).  General Appearance: Fairly Groomed- improved compared to admission  Eye Contact::  Good  Speech:  Normal Rate  Volume:  Normal  Mood:   Reports partially improving mood   Affect:   Reactive, not blunted or constricted at this time  Thought Process:  Linear  Orientation:  Full (Time, Place, and Person)   Thought Content:   Reports hallucinations ( auditory ) -  States intensity of hallucinations is improved, does not endorse visual hallucinations, does not appear internally preoccupied at this time - no delusions expressed   Suicidal Thoughts:  No-  Denies plan or intention of hurting self and contracts for safety on the unit. Reports auditory hallucinations telling him to hurt himself but denies any plan or intention of acting on these    Homicidal Thoughts:  No  Memory:   Recent and remote grossly intact   Judgement:  Fair  Insight:  Fair  Psychomotor Activity:  Normal- no agitation or restlessness   Concentration:  Good  Recall:  Good  Fund of Knowledge:Good  Language: Good  Akathisia:  No  Handed:    AIMS (if indicated):     Assets:  Communication Skills Desire for Improvement Physical Health Resilience Social Support  ADL's:  Intact  Cognition: WNL  Sleep:  Number of  Hours: 5.75  Assessment -  Patient acknowledges partial improvement of mood and presents with much improved mood and range of affect compared to admission. Reports ongoing auditory hallucinations, but states they are becoming less intense and does not present as internally preoccupied or overtly psychotic. As noted does not express any delusions and thought process is linear and well organized .  He is tolerating medication titration well and has not developed any EPS with Haldol trial/ titration  Treatment Plan Summary: Daily contact with patient to assess and evaluate symptoms and progress in treatment and Medication management Continue Prozac 20 mgrs QAM  For deprssion Continue Cogentin 0.5 mgrs BID to minimize risk of dystonic reactions Continue Haldol to 5 mgrs BID to address ongoing psychotic symptoms- medication side effects reviewed Encourage ongoing milieu participation to work on coping skills and symptom reduction Continue Trazodone 50 mgrs QHS PRN for Insomnia, as needed  Treatment team working on  disposition planning- [facility]. Nehemiah MassedOBOS, Kalya Troeger,  MD  06/10/2015, 10:46 AM

## 2015-06-10 NOTE — Progress Notes (Signed)
Patient ID: Bruce [name], male   DOB: 09/06/1975, 38 y.o.   MRN: [id number] D: Patient presented with depressed mood and flat affect. Pt endorses passive suicidal and homicidal ideation but contracts for safety. Pt also reports having A/VH without a plan. Pt attended evening wrap up group and Interacted appropriately with peers. Cooperative with assessment. No acute distressed noted.   A: Met with pt 1:1. Medications administered as prescribed. Writer encouraged pt to discuss feelings. Pt encouraged to come to staff with any question or concerns.   R: Patient remains safe and complaint with medications.   

## 2015-06-10 NOTE — [facility] Group Notes (Signed)
[facility] Group Notes:  (Nursing/MHT/Case Management/Adjunct)  Date:  06/10/2015  Time:  0900 am  Type of Therapy:  Psychoeducational Skills  Participation Level:  Did Not Attend  Patient invited to group; declined to attend.   Cranford MonBeaudry, Dalesha Stanback Evans 06/10/2015, 9:43 AM

## 2015-06-10 NOTE — Plan of Care (Signed)
Problem: Alteration in thought process Goal: STG-Patient does not respond to command hallucinations Outcome: Progressing Pt endorses A/VH without command. Appeared not to be responding to internal stimuli.

## 2015-06-10 NOTE — Progress Notes (Signed)
Adult Psychoeducational Group Note  Date:  06/10/2015 Time:  8:38 PM  Group Topic/Focus:  Wrap-Up Group:   The focus of this group is to help patients review their daily goal of treatment and discuss progress on daily workbooks.  Participation Level:  Minimal  Participation Quality:  Appropriate  Affect:  Appropriate  Cognitive:  Appropriate  Insight: Appropriate  Engagement in Group:  Limited  Modes of Intervention:  Discussion  Additional Comments:  Pt rated overall day a 6 out of 10 because he got to talk to his mom, which was the highlight of his day. Pt reported that he did not meet his goal for the day, which was "to be less stressed".   Cleotilde NeerJasmine S Troi Bechtold 06/10/2015, 9:58 PM

## 2015-06-10 NOTE — Progress Notes (Signed)
D: Loukas attended wrap up group tonight however he is minimally interactive with staff and peers. He missed only 1 group today. Endorses HI and passive AH. He denies SI. He contracts for safety at this time. He has chronic back pain 6/10 and rates depression 7/10 and anxiety 5/10. His affect is sad this evening.  A: Encouragement and support given.  R: Continue to monitor for patient safety and medication effectiveness.

## 2015-06-10 NOTE — Tx Team (Signed)
Interdisciplinary Treatment Plan Update (Adult) Date: 06/10/2015    Time Reviewed: 9:30 AM  Progress in Treatment: Attending groups: Intermittently Participating in groups: No Taking medication as prescribed: Yes Tolerating medication: Yes Family/Significant other contact made: No, Pt declines family contact Patient understands diagnosis: Yes Discussing patient identified problems/goals with staff: Yes Medical problems stabilized or resolved: Yes Denies suicidal/homicidal ideation: Yes Issues/concerns per patient self-inventory: Yes Other:  New problem(s) identified: N/A  Discharge Plan or Barriers: Pt considering [facility], [facility], or [facility]; follow-up dependent on discharge plans  Reason for Continuation of Hospitalization:  Depression Anxiety Medication Stabilization   Comments: N/A  Estimated length of stay: 3-5 days   Patient is a 39 year old male admitted for SI with plan to jump off bridge and alcohol abuse. Patient will benefit from crisis stabilization, medication evaluation, group therapy, and psycho education in addition to case management for discharge planning. Patient and CSW reviewed pt's identified goals and treatment plan. Pt verbalized understanding and agreed to treatment plan.  Review of initial/current patient goals per problem list:  1. Goal(s): Patient will participate in aftercare plan   Met: No   Target date: 3-5 days post admission date   As evidenced by: Patient will participate within aftercare plan AEB aftercare provider and housing plan at discharge being identified.   10/14: Goal not met: CSW assessing for appropriate referrals for pt and will have follow up secured prior to d/c.  06/10/15: Continuing to assess for appropriate referrals   2. Goal (s): Patient will exhibit decreased depressive symptoms and suicidal ideations.   Met: No   Target date: 3-5 days post admission date   As evidenced by:  Patient will utilize self rating of depression at 3 or below and demonstrate decreased signs of depression or be deemed stable for discharge by MD.  10/14: Goal not met: Pt presents with flat affect and depressed mood.  Pt with depression rating of 9 today.  Pt to show decreased sign of depression and a rating of 3 or less before d/c.    06/10/15: Pt rating depression at 9/10; endorses SI/HI 3. Goal(s): Patient will demonstrate decreased signs and symptoms of anxiety.   Met: No   Target date: 3-5 days post admission date   As evidenced by: Patient will utilize self rating of anxiety at 3 or below and demonstrated decreased signs of anxiety, or be deemed stable for discharge by MD  10/14: Goal not met: Pt presents with anxious mood and affect.  Pt with anxiety rating of 7 today.  Pt to show decreased sign of anxiety and a rating of 3 or less before d/c.  06/10/15: Pt rating anxiety at 7/10  4. Goal(s): Patient will demonstrate decreased signs of withdrawal due to substance abuse   Met: Yes   Target date: 3-5 days post admission date   As evidenced by: Patient will produce a CIWA/COWS score of 0, have stable vitals signs, and no symptoms of withdrawal  10/14: Goal met: No withdrawal symptoms reported at this time per medical chart.      Attendees: Patient:    Family:    Physician: Dr. Parke Poisson 06/10/2015 9:30 AM  Nursing:  Gaylan Gerold  RN 06/10/2015 9:30 AM  Clinical Social Worker: Peri Maris  LCSWA 06/10/2015 9:30 AM   06/10/2015 9:30 AM  Other: Lucinda Dell, Beverly Sessions Liaison 06/10/2015 9:30 AM  Other: Lars Pinks, Case Manager 06/10/2015 9:30 AM  Other:   06/10/2015 9:30 AM  Other:  Other:    Other:    Other:      Scribe for Treatment Team:  Peri Maris, [place] [employer] [phone]

## 2015-06-10 NOTE — Progress Notes (Signed)
Recreation Therapy Notes  Animal-Assisted Activity (AAA) Program Checklist/Progress Notes Patient Eligibility Criteria Checklist & Daily Group note for Rec Tx Intervention  Date: 10.18.2016  Time: 2:45pm Location: [address] Dayroom    AAA/T Program Assumption of Risk Form signed by Patient/ or Parent Legal Guardian yes  Patient is free of allergies or sever asthma yes  Patient reports no fear of animals yes  Patient reports no history of cruelty to animals yes  Patient understands his/her participation is voluntary yes  Behavioral Response: Did not attend.   Oney Tatlock L Evangelina Delancey, LRT/CTRS       Jamey Demchak L 06/10/2015 3:11 PM 

## 2015-06-10 NOTE — Progress Notes (Signed)
Patient ID: [name], male   DOB: [birth year], 39 y.o.   MRN: [id number]  DAR: Pt. Denies A/V Hallucinations. He continues to endorse SI and HI however does not report any particular person he is feeling HI towards. Patient is able to contract for safety. He reports sleep was fair, appetite is fair, energy level is low, and concentration is good. He rates her depression 6/10, hopelessness 8/10, and anxiety 6/10. Patient does not report any pain or discomfort at this time. Support and encouragement provided to the patient. Scheduled medications administered to patient per physician's orders. Patient is minimal and isolative at this time. He continues to forward little. Q15 minute checks are maintained for safety.

## 2015-06-10 NOTE — Plan of Care (Signed)
Problem: Alteration in thought process Goal: STG-Patient is able to follow short directions Outcome: Progressing Bruce [name] is able to follow directions without difficulty.

## 2015-06-11 MED ORDER — TRAZODONE HCL 50 MG PO TABS
50.0000 mg | ORAL_TABLET | Freq: Every evening | ORAL | Status: DC | PRN
  Administered 2015-06-11 – 2015-06-12 (×2): 50 mg via ORAL
  Filled 2015-06-11 (×2): qty 1

## 2015-06-11 NOTE — [facility] Group Notes (Signed)
[facility] LCSW Group Therapy 06/11/2015  1:15 PM Type of Therapy: Group Therapy Participation Level: Active  Participation Quality: Attentive, Sharing and Supportive  Affect: Depressed and Flat  Cognitive: Alert and Oriented  Insight: Developing/Improving and Engaged  Engagement in Therapy: Developing/Improving and Engaged  Modes of Intervention: Clarification, Confrontation, Discussion, Education, Exploration, Limit-setting, Orientation, Problem-solving, Rapport Building, Dance movement psychotherapisteality Testing, Socialization and Support  Summary of Progress/Problems: The topic for group today was emotional regulation. This group focused on both positive and negative emotion identification and allowed group members to process ways to identify feelings, regulate negative emotions, and find healthy ways to manage internal/external emotions. Group members were asked to reflect on a time when their reaction to an emotion led to a negative outcome and explored how alternative responses using emotion regulation would have benefited them. Group members were also asked to discuss a time when emotion regulation was utilized when a negative emotion was experienced. Patient discussed having difficulty opening up to others emotionally as he has been unsupported by others in the past. Patient shared that he has a tendency to "bottle up" his emotions which later causes him to lash out. CSW and other group members provided patient with emotional support and encouragement.  Samuella BruinKristin Maninder Deboer, MSW, [employer]CSWA Clinical Social Worker [facility] [phone]

## 2015-06-11 NOTE — Progress Notes (Signed)
D:Patient in the dayroom on approach. Patient states, "My day was just ok due to the voices."  Patient states, "I hear voices all the time."  Patient states he is passive SI, reports HI towards no one specific and auditory hallucinations that tell him to kill himself and others.   Patient verbally contracts for safety.  Patient rates his depression 7/10 and anxiety 6/10. A: Staff to monitor Q 15 mins for safety.  Encouragement and support offered.  Scheduled medications administered per orders.  Ibuprofen administered prn for back pain.  Trazodone administered prn for sleep. R: Patient remains safe on the unit.  Patient attended group tonight.  Patient visible on the unit and interacting with peers.  Patient taking administered medications.

## 2015-06-11 NOTE — Plan of Care (Signed)
Problem: Alteration in mood Goal: STG-Patient is able to discuss feelings and issues (Patient is able to discuss feelings and issues leading to depression)  Outcome: Not Progressing Patient remains minimal and forwards little to this Clinical research associatewriter.

## 2015-06-11 NOTE — [facility] Group Notes (Signed)
   [facility] After[facility] Discharge Planning Group Note  06/11/2015  8:45 AM   Participation Quality: Alert, Appropriate and Oriented  Mood/Affect: Depressed and Flat  Depression Rating: 6  Anxiety Rating: 6  Thoughts of Suicide: Pt endorses SI/HI  Will you contract for safety? Yes  Current AVH: Pt denies  Plan for Discharge/Comments: Pt attended discharge planning group and actively participated in group. CSW provided pt with today's workbook. [facility]  Transportation Means: Pt reports access to transportation  Supports: No supports mentioned at this time  Samuella BruinKristin Tambra Muller, MSW, [employer]CSWA Clinical Social Worker [facility] [phone]

## 2015-06-11 NOTE — Progress Notes (Signed)
Patient ID: Bruce [name], male   DOB: 03-30-1976, 39 y.o.   MRN: [id number]  DAR: Pt. Denies HI and visual Hallucinations. He reports auditory hallucinations that are command in nature and endorses SI, able to contract for safety. He reports sleep was fair, appetite is good, energy level is low, and concentration is good. He rates depression 6/10, hopelessness 7/10, and anxiety 5/10. Patient does not report any pain or discomfort at this time. Support and encouragement provided to the patient. Scheduled medications administered to patient per physician's orders. Patient remains minimal and isolative. Affect remains blunted and mood is depressed. Q15 minute safety checks maintained.

## 2015-06-11 NOTE — Progress Notes (Signed)
Patient ID: Bruce [name], male   DOB: April 18, 1976, 39 y.o.   MRN: [id number] [facility] MD Progress Note  06/11/2015  Loris Winrow  MRN:  [id number] Subjective:  Patient  Continues to reports auditory hallucinations, but acknowledges they are " quieting down". He states " I am starting to feel better". Denies medication side effects- has not developed any akathisia or dystonia .    Objective: Pt seen and  And case discussed with staff . As per staff report patient has been visible on the unit, although he presents depressed and flat in affect at times. No disruptive behaviors on the unit, seems to be becoming more interactive with selected peers . He has reported ongoing hallucinations, ongoing passive SI and vague HI but not directed towards anyone in particular . His behavior has remained in good control, he has not exhibited any self injurious  Behaviors on the unit, and he does not appear internally preoccupied. At this time, as noted, his mood is improved, and he is presenting with a full range of affect. He is tolerating medications well and feels they are helping- he has no EPS or dystonia and no akathisia is noted or reported. He remains future oriented, stating he is hoping for discharge soon as he continues to improve, with a plan of going to [facility] after discharge .     Principal Problem: Schizophrenia, residual, subchronic, with acute exacerbation (HCC) Diagnosis:   Patient Active Problem List   Diagnosis Date Noted  . Suicidal behavior [F48.9] 06/05/2015  . Alcohol dependence (HCC) [F10.20] 11/20/2013  . Schizophrenia, residual, subchronic, with acute exacerbation (HCC) [F20.5] 11/18/2013   Total Time spent with patient:  20 minutes   Past Psychiatric History: SEE H&P  Past Medical History:  Past Medical History  Diagnosis Date  . Depression   . Schizophrenia (HCC)    History reviewed. No pertinent past surgical history. Family History:  Family History   Problem Relation Age of Onset  . Schizophrenia Mother    Family Psychiatric  History: SEE H&P Social History:  History  Alcohol Use  . 16.8 oz/week  . 28 Cans of beer per week     History  Drug Use No    Social History   Social History  . Marital Status: Single    Spouse Name: N/A  . Number of Children: N/A  . Years of Education: N/A   Social History Main Topics  . Smoking status: Never Smoker   . Smokeless tobacco: None  . Alcohol Use: 16.8 oz/week    28 Cans of beer per week  . Drug Use: No  . Sexual Activity: Not Currently   Other Topics Concern  . None   Social History Narrative   Additional Social History:    Pain Medications: denies Prescriptions: denies Over the Counter: denies History of alcohol / drug use?: Yes Longest period of sobriety (when/how long): 2 weeks Negative Consequences of Use: Financial, Personal relationships, Work / School Name of Substance 1: alcohol 1 - Age of First Use: 17 1 - Amount (size/oz): varies (7-9 drinks) 1 - Frequency: 4 or more times a week 1 - Duration: 15+ years 1 - Last Use / Amount: 1 week ago  Sleep:  Improved   Appetite:   Improved   Current Medications: Current Facility-Administered Medications  Medication Dose Route Frequency Provider Last Rate Last Dose  . acetaminophen (TYLENOL) tablet 650 mg  650 mg Oral Q4H PRN Adonis Brook, NP      .  alum & mag hydroxide-simeth (MAALOX/MYLANTA) 200-200-20 MG/5ML suspension 30 mL  30 mL Oral Q4H PRN Adonis Brook, NP   30 mL at 06/06/15 2200  . benztropine (COGENTIN) tablet 0.5 mg  0.5 mg Oral BID Craige Cotta, MD   0.5 mg at 06/11/15 0846  . FLUoxetine (PROZAC) capsule 20 mg  20 mg Oral Daily Adonis Brook, NP   20 mg at 06/11/15 0846  . haloperidol (HALDOL) tablet 5 mg  5 mg Oral BID Craige Cotta, MD   5 mg at 06/11/15 0846  . ibuprofen (ADVIL,MOTRIN) tablet 600 mg  600 mg Oral Q8H PRN Craige Cotta, MD   600 mg at 06/10/15 2122  . magnesium hydroxide  (MILK OF MAGNESIA) suspension 30 mL  30 mL Oral Daily PRN Adonis Brook, NP      . multivitamin with minerals tablet 1 tablet  1 tablet Oral Daily Adonis Brook, NP   1 tablet at 06/11/15 0846  . thiamine (VITAMIN B-1) tablet 100 mg  100 mg Oral Daily Craige Cotta, MD   100 mg at 06/11/15 0846  . traZODone (DESYREL) tablet 50 mg  50 mg Oral QHS PRN Craige Cotta, MD   50 mg at 06/10/15 2122    Lab Results:  No results found for this or any previous visit (from the past 48 hour(s)).  Physical Findings: AIMS: Facial and Oral Movements Muscles of Facial Expression: None, normal Lips and Perioral Area: None, normal Jaw: None, normal Tongue: None, normal,Extremity Movements Upper (arms, wrists, hands, fingers): None, normal Lower (legs, knees, ankles, toes): None, normal, Trunk Movements Neck, shoulders, hips: None, normal, Overall Severity Severity of abnormal movements (highest score from questions above): None, normal Incapacitation due to abnormal movements: None, normal Patient's awareness of abnormal movements (rate only patient's report): No Awareness, Dental Status Current problems with teeth and/or dentures?: No Does patient usually wear dentures?: No  CIWA:  CIWA-Ar Total: 1 COWS:  COWS Total Score: 0  Musculoskeletal: Strength & Muscle Tone: within normal limits Gait & Station: normal Patient leans: N/A  Psychiatric Specialty Exam: Review of Systems  Psychiatric/Behavioral: Positive for depression, suicidal ideas and hallucinations. The patient is nervous/anxious and has insomnia.   All other systems reviewed and are negative. denies chest pain, denies shortness of breath, no vomiting, does not report dystonia or abnormal muscle movements  Blood pressure 106/69, pulse 96, temperature 98 F (36.7 C), temperature source Oral, resp. rate 20, height  (1.702 m), weight 350 lb (158.759 kg), SpO2 100 %.Body mass index is 54.8 kg/(m^2).  General Appearance: Fairly  Groomed- improved compared to admission  Eye Contact::  Good  Speech:  Normal Rate  Volume:  Normal  Mood:    Mood presents improved   Affect:   Reactive,  Today smiling and even laughing appropriately at times   Thought Process:  Linear  Orientation:  Full (Time, Place, and Person)  Thought Content:   Reports hallucinations ( auditory ) -   Reports frequency and intensity of hallucinations has decreased and does not appear internally preoccupied, no delusions expressed   Suicidal Thoughts:  No-  Denies plan or intention of hurting self and contracts for safety on the unit.    Homicidal Thoughts:   Has reported vague HI , but not directed towards any specific person, and has denied any plan or intention of acting violently towards anyone - behavior has remained calm and in good control.  Memory:   Recent and remote grossly intact  Judgement:  Fair  Insight:  Fair  Psychomotor Activity:  Normal- no agitation or restlessness   Concentration:  Good  Recall:  Good  Fund of Knowledge:Good  Language: Good  Akathisia:  No  Handed:    AIMS (if indicated):     Assets:  Communication Skills Desire for Improvement Physical Health Resilience Social Support  ADL's:  Intact  Cognition: WNL  Sleep:  Number of Hours: 5.75  Assessment -  Patient continues to improve compared to admission- his grooming, relatedness, and much improved range of affect all indicate improvement. He has continued to report auditory hallucinations, but states these are abating and does not appear internally preoccupied at this time. He has reported some ongoing passive SI, HI, but acknowledges improving mood and is future oriented, [facility]. He is tolerating medications well. Will continue current medication regimen at this time.  Treatment Plan Summary: Daily contact with patient to assess and evaluate symptoms and progress in treatment and Medication  management Continue Prozac 20 mgrs QAM  For deprssion Continue Cogentin 0.5 mgrs BID to minimize risk of dystonic reactions Continue Haldol 5 mgrs BID to address ongoing psychotic symptoms- medication side effects reviewed Encourage ongoing milieu participation to work on coping skills and symptom reduction Continue Trazodone 50 mgrs QHS PRN for Insomnia, as needed  Treatment team working on disposition planning- [facility]. Nehemiah MassedOBOS, Carzell Saldivar,  MD  06/11/2015, 10:46 AM

## 2015-06-11 NOTE — Progress Notes (Signed)
Adult Psychoeducational Group Note  Date:  06/11/2015 Time:  9:23 PM  Group Topic/Focus:  Wrap-Up Group:   The focus of this group is to help patients review their daily goal of treatment and discuss progress on daily workbooks.  Participation Level:  Active  Participation Quality:  Appropriate and Attentive  Affect:  Appropriate  Cognitive:  Appropriate  Insight: Appropriate and Good  Engagement in Group:  Engaged  Modes of Intervention:  Education  Additional Comments:  Pt way to regulate emotions is to talk about them. Pt goal for tomorrow is to be less stressful and depress.   Merlinda FrederickKeshia S Jorryn Hershberger 06/11/2015, 9:23 PM

## 2015-06-12 DIAGNOSIS — F205 Residual schizophrenia: Principal | ICD-10-CM

## 2015-06-12 MED ORDER — HALOPERIDOL 5 MG PO TABS
7.0000 mg | ORAL_TABLET | Freq: Every day | ORAL | Status: DC
  Administered 2015-06-12: 7 mg via ORAL
  Filled 2015-06-12 (×2): qty 1
  Filled 2015-06-12: qty 7
  Filled 2015-06-12 (×2): qty 1
  Filled 2015-06-12: qty 7

## 2015-06-12 MED ORDER — HALOPERIDOL 5 MG PO TABS
5.0000 mg | ORAL_TABLET | Freq: Every morning | ORAL | Status: DC
  Administered 2015-06-13: 5 mg via ORAL
  Filled 2015-06-12 (×2): qty 7
  Filled 2015-06-12 (×2): qty 1

## 2015-06-12 NOTE — Progress Notes (Signed)
D: Patient in the dayroom on approach.  Patient states he had a good day but states he slept a lot today.  Patient states, "I was able to rest today."  Patient states he did not attend many groups.  Patient denies SI/HI but states he still has auditory hallucinations.  Patient verbally contracts for safety.  Patient also states he is less stressed today.  Patient states [facility]. A: Staff to monitor Q 15 mins for safety.  Encouragement and support offered.  Scheduled medications administered per orders.   R: Patient remains safe on the unit.  Patient attended group tonight.  Patient visible on the unit and interacting with peers.  Patient taking administered medications.

## 2015-06-12 NOTE — Progress Notes (Signed)
D. Pt has been in the room sleeping most of the day, pt did not attend the group but would come out for medications and meals. Pt took all his meds as scheduled. Pt denied physical pain and stated that his goal for today is "to be less stressfully." Pt reported that his depression was a 6, his hopelessness was a 7, and that his anxiety was a 5. Pt reported being negative SI/HI, no AH/VH noted. A: 15 min checks continued for patient safety. R: Pts safety maintained.

## 2015-06-12 NOTE — [facility] Group Notes (Signed)
[facility] Group Therapy 06/12/2015 1:15pm  Type of Therapy: [facility] Presentation  Pt did not attend, declined invitation.   Chad CordialLauren Carter, LCSWA 06/12/2015 1:28 PM

## 2015-06-12 NOTE — Progress Notes (Signed)
Patient did attend the evening karaoke group. Pt was engaged and supportive but did not participate by singing a song.    

## 2015-06-12 NOTE — Tx Team (Addendum)
Interdisciplinary Treatment Plan Update (Adult) Date: 06/12/2015    Time Reviewed: 9:30 AM  Progress in Treatment: Attending groups: Intermittently Participating in groups: Yes Taking medication as prescribed: Yes Tolerating medication: Yes Family/Significant other contact made: No, Pt declines family contact Patient understands diagnosis: Yes Discussing patient identified problems/goals with staff: Yes Medical problems stabilized or resolved: Yes Denies suicidal/homicidal ideation: Yes Issues/concerns per patient self-inventory: Yes Other:  New problem(s) identified: N/A  Discharge Plan or Barriers: Patient plans to discharge to [facility]  Reason for Continuation of Hospitalization:  Depression Anxiety Medication Stabilization   Comments: N/A  Estimated length of stay: 1-2 days   Patient is a 39 year old male admitted for SI with plan to jump off bridge and alcohol abuse. Patient will benefit from crisis stabilization, medication evaluation, group therapy, and psycho education in addition to case management for discharge planning. Patient and CSW reviewed pt's identified goals and treatment plan. Pt verbalized understanding and agreed to treatment plan.  Review of initial/current patient goals per problem list:  1. Goal(s): Patient will participate in aftercare plan   Met: Yes   Target date: 3-5 days post admission date   As evidenced by: Patient will participate within aftercare plan AEB aftercare provider and housing plan at discharge being identified.   10/14: Goal not met: CSW assessing for appropriate referrals for pt and will have follow up secured prior to d/c.  06/10/15: Continuing to assess for appropriate referrals  10/20: Goal met: Patient plans to go to [facility] to follow up with outpatient services    2. Goal (s): Patient will exhibit decreased depressive symptoms and suicidal ideations.   Met: Adequate for discharge per MD   Target  date: 3-5 days post admission date   As evidenced by: Patient will utilize self rating of depression at 3 or below and demonstrate decreased signs of depression or be deemed stable for discharge by MD.  10/14: Goal not met: Pt presents with flat affect and depressed mood.  Pt with depression rating of 9 today.  Pt to show decreased sign of depression and a rating of 3 or less before d/c.    06/10/15: Pt rating depression at 9/10; endorses SI/HI  10/20: Adequate for discharge per MD.  Patient at baseline level, reports feeling safe to discharge.   3. Goal(s): Patient will demonstrate decreased signs and symptoms of anxiety.   Met: Adequate for discharge per MD   Target date: 3-5 days post admission date   As evidenced by: Patient will utilize self rating of anxiety at 3 or below and demonstrated decreased signs of anxiety, or be deemed stable for discharge by MD  10/14: Goal not met: Pt presents with anxious mood and affect.  Pt with anxiety rating of 7 today.  Pt to show decreased sign of anxiety and a rating of 3 or less before d/c.  06/10/15: Pt rating anxiety at 7/10  10/20: Adequate for discharge per MD. Patient's anxiety at baseline level, reports feeling safe for discharge.    4. Goal(s): Patient will demonstrate decreased signs of withdrawal due to substance abuse   Met: Yes   Target date: 3-5 days post admission date   As evidenced by: Patient will produce a CIWA/COWS score of 0, have stable vitals signs, and no symptoms of withdrawal  10/14: Goal met: No withdrawal symptoms reported at this time per medical chart.      Attendees: Patient:    Family:    Physician: Dr. Parke Poisson; Dr.  Lugo 06/12/2015 9:30 AM  Nursing: Eulogio Bear, Grayland Ormond, Burnett Kanaris ,RN 06/12/2015 9:30 AM  Clinical Social Worker: Tilden Fossa, [place] 06/12/2015 9:30 AM  Other: Louie Bun Smart LCSWA  06/12/2015 9:30 AM  Other: Lucinda Dell, Beverly Sessions  Liaison 06/12/2015 9:30 AM  Other: Lars Pinks, Case Manager 06/12/2015 9:30 AM  Other: Samuel Jester , NP 06/12/2015 9:30 AM  Other:    Other:          Tilden Fossa, MSW, [place] Worker [facility] [phone]

## 2015-06-12 NOTE — Progress Notes (Signed)
Patient ID: Bruce [name], male   DOB: 1976-07-15, 39 y.o.   MRN: [id number] [facility] MD Progress Note  06/12/2015  Bruce [name]  MRN:  [id number] Subjective:  Patient  Reports he is continues to do better. He states his mood is better, he feels less depressed. He denies any SI at present. He does state he continues to experience auditory hallucinations, telling him to hurt himself . States these have improved, but not resolved, on Haldol trial. He denies medication side effects. He is focusing on discharge planning issues .    Objective: Pt seen and  And case discussed with staff . As per staff , patient calm, pleasant on approach, no disruptive or agitated behaviors. Going to some groups. Mood and affect present improved compared to admission, not irritable. Reports ongoing auditory hallucinations but does not appear internally preoccupied or overtly paranoid . Denies medication side effects- has not developed any dystonia or abnormal involuntary muscle movements, no akathisia. Remains focused on discharging to [facility] as disposition plan.     Principal Problem: Schizophrenia, residual, subchronic, with acute exacerbation (HCC) Diagnosis:   Patient Active Problem List   Diagnosis Date Noted  . Suicidal behavior [F48.9] 06/05/2015  . Alcohol dependence (HCC) [F10.20] 11/20/2013  . Schizophrenia, residual, subchronic, with acute exacerbation (HCC) [F20.5] 11/18/2013   Total Time spent with patient:  20 minutes   Past Psychiatric History: SEE H&P  Past Medical History:  Past Medical History  Diagnosis Date  . Depression   . Schizophrenia (HCC)    History reviewed. No pertinent past surgical history. Family History:  Family History  Problem Relation Age of Onset  . Schizophrenia Mother    Family Psychiatric  History: SEE H&P Social History:  History  Alcohol Use  . 16.8 oz/week  . 28 Cans of beer per week     History  Drug Use No    Social History   Social  History  . Marital Status: Single    Spouse Name: N/A  . Number of Children: N/A  . Years of Education: N/A   Social History Main Topics  . Smoking status: Never Smoker   . Smokeless tobacco: None  . Alcohol Use: 16.8 oz/week    28 Cans of beer per week  . Drug Use: No  . Sexual Activity: Not Currently   Other Topics Concern  . None   Social History Narrative   Additional Social History:    Pain Medications: denies Prescriptions: denies Over the Counter: denies History of alcohol / drug use?: Yes Longest period of sobriety (when/how long): 2 weeks Negative Consequences of Use: Financial, Personal relationships, Work / School Name of Substance 1: alcohol 1 - Age of First Use: 17 1 - Amount (size/oz): varies (7-9 drinks) 1 - Frequency: 4 or more times a week 1 - Duration: 15+ years 1 - Last Use / Amount: 1 week ago  Sleep:  Improved   Appetite:   Improved   Current Medications: Current Facility-Administered Medications  Medication Dose Route Frequency Provider Last Rate Last Dose  . acetaminophen (TYLENOL) tablet 650 mg  650 mg Oral Q4H PRN Adonis Brook, NP      . alum & mag hydroxide-simeth (MAALOX/MYLANTA) 200-200-20 MG/5ML suspension 30 mL  30 mL Oral Q4H PRN Adonis Brook, NP   30 mL at 06/06/15 2200  . benztropine (COGENTIN) tablet 0.5 mg  0.5 mg Oral BID Craige Cotta, MD   0.5 mg at 06/12/15 1638  . FLUoxetine (PROZAC) capsule  20 mg  20 mg Oral Daily Adonis Brook, NP   20 mg at 06/12/15 1610  . [START ON 06/13/2015] haloperidol (HALDOL) tablet 5 mg  5 mg Oral q morning - 10a Adora Yeh A Matilynn Dacey, MD      . haloperidol (HALDOL) tablet 7 mg  7 mg Oral QHS Rockey Situ Ferron Ishmael, MD      . ibuprofen (ADVIL,MOTRIN) tablet 600 mg  600 mg Oral Q8H PRN Craige Cotta, MD   600 mg at 06/11/15 2111  . magnesium hydroxide (MILK OF MAGNESIA) suspension 30 mL  30 mL Oral Daily PRN Adonis Brook, NP      . multivitamin with minerals tablet 1 tablet  1 tablet Oral Daily  Adonis Brook, NP   1 tablet at 06/12/15 [phone]  . thiamine (VITAMIN B-1) tablet 100 mg  100 mg Oral Daily Craige Cotta, MD   100 mg at 06/12/15 5409  . traZODone (DESYREL) tablet 50 mg  50 mg Oral QHS PRN,MR X 1 Kerry Hough, PA-C   50 mg at 06/11/15 2111    Lab Results:  No results found for this or any previous visit (from the past 48 hour(s)).  Physical Findings: AIMS: Facial and Oral Movements Muscles of Facial Expression: None, normal Lips and Perioral Area: None, normal Jaw: None, normal Tongue: None, normal,Extremity Movements Upper (arms, wrists, hands, fingers): None, normal Lower (legs, knees, ankles, toes): None, normal, Trunk Movements Neck, shoulders, hips: None, normal, Overall Severity Severity of abnormal movements (highest score from questions above): None, normal Incapacitation due to abnormal movements: None, normal Patient's awareness of abnormal movements (rate only patient's report): No Awareness, Dental Status Current problems with teeth and/or dentures?: No Does patient usually wear dentures?: No  CIWA:  CIWA-Ar Total: 1 COWS:  COWS Total Score: 0  Musculoskeletal: Strength & Muscle Tone: within normal limits Gait & Station: normal Patient leans: N/A  Psychiatric Specialty Exam: Review of Systems  Psychiatric/Behavioral: Positive for depression, suicidal ideas and hallucinations. The patient is nervous/anxious and has insomnia.   All other systems reviewed and are negative. denies chest pain, denies shortness of breath, no vomiting, does not report dystonia or abnormal muscle movements  Blood pressure 118/82, pulse 63, temperature 98 F (36.7 C), temperature source Oral, resp. rate 18, height  (1.702 m), weight 350 lb (158.759 kg), SpO2 100 %.Body mass index is 54.8 kg/(m^2).  General Appearance: Fairly Groomed- improved compared to admission  Eye Contact::  Good  Speech:  Normal Rate  Volume:  Normal  Mood:    Mood  Improved   Affect:    Reactive,  Not irritable or guarded   Thought Process:  Linear  Orientation:  Full (Time, Place, and Person)  Thought Content:   Reports ongoing auditory  Hallucinations, decreased compared to admission- no delusions expressed   Suicidal Thoughts:  No-  Denies plan or intention of hurting self and contracts for safety on the unit.    Homicidal Thoughts:    Today denies HI   Memory:   Recent and remote grossly intact   Judgement:   Improving   Insight:   Improving   Psychomotor Activity:  Normal- no agitation or restlessness   Concentration:  Good  Recall:  Good  Fund of Knowledge:Good  Language: Good  Akathisia:  No  Handed:    AIMS (if indicated):     Assets:  Communication Skills Desire for Improvement Physical Health Resilience Social Support  ADL's:  Intact  Cognition: WNL  Sleep:  Number of Hours: 6.75  Assessment -   Gradual improvement compared to admission, but significantly better- he is now presenting with a fuller range of affect, not endorsing neuro-vegetative symptoms of depression, is more future oriented. He is not irritable and is denying any active SI or HI at this time and his behavior is calm and in good control. He is tolerating medications well- he does report persistence , although some improvement , of auditory hallucinations. Would titrate Haldol up slightly in order to address these reported symptoms, and still minimize risk of emerging side effects.  Treatment Plan Summary: Daily contact with patient to assess and evaluate symptoms and progress in treatment and Medication management Continue Prozac 20 mgrs QAM  For deprssion Continue Cogentin 0.5 mgrs BID to minimize risk of dystonic reactions Increase  Haldol to  5 mgrs QAM and 7 mgrs QHS  to address ongoing psychotic symptoms- medication side effects reviewed Encourage ongoing milieu participation to work on coping skills and symptom reduction Continue Trazodone 50 mgrs QHS PRN for Insomnia, as needed   Treatment team working on disposition planning- [facility]. Nehemiah MassedOBOS, Kenecia Barren,  MD  06/12/2015, 10:46 AM

## 2015-06-12 NOTE — [facility] Group Notes (Signed)
[facility] Group Notes:  (Nursing/MHT/Case Management/Adjunct)  Date:  06/12/2015  Time:  1:24 PM  Type of Therapy:  Psychoeducational Skills  Participation Level:  Did Not Attend  Participation Quality:  DID NOT ATTEND  Affect:  DID NOT ATTEND  Cognitive:  DID NOT ATTEND  Insight:  None  Engagement in Group:  DID NOT ATTEND  Modes of Intervention:  DID NOT ATTEND  Summary of Progress/Problems: Pt did not attend patient self inventory group.   Bethann PunchesJane O Nazaria Riesen 06/12/2015, 1:24 PM

## 2015-06-13 DIAGNOSIS — F203 Undifferentiated schizophrenia: Secondary | ICD-10-CM | POA: Insufficient documentation

## 2015-06-13 MED ORDER — FLUOXETINE HCL 20 MG PO CAPS: 20.0000 mg | ORAL_CAPSULE | Freq: Every day | ORAL | Status: DC

## 2015-06-13 MED ORDER — BENZTROPINE MESYLATE 0.5 MG PO TABS: 0.5000 mg | ORAL_TABLET | Freq: Two times a day (BID) | ORAL | Status: DC

## 2015-06-13 MED ORDER — HALOPERIDOL 1 MG PO TABS: 7.0000 mg | ORAL_TABLET | Freq: Every day | ORAL | Status: DC

## 2015-06-13 MED ORDER — TRAZODONE HCL 100 MG PO TABS: 100.0000 mg | ORAL_TABLET | Freq: Every evening | ORAL | Status: DC | PRN

## 2015-06-13 MED ORDER — HALOPERIDOL 5 MG PO TABS: 5.0000 mg | ORAL_TABLET | Freq: Every day | ORAL | Status: DC

## 2015-06-13 MED ORDER — TRAZODONE HCL 100 MG PO TABS
100.0000 mg | ORAL_TABLET | Freq: Every evening | ORAL | Status: DC | PRN
  Filled 2015-06-13: qty 7

## 2015-06-13 MED ORDER — HALOPERIDOL 5 MG PO TABS: 5.0000 mg | ORAL_TABLET | Freq: Every morning | ORAL | Status: DC

## 2015-06-13 MED ORDER — ADULT MULTIVITAMIN W/MINERALS CH: 1.0000 | ORAL_TABLET | Freq: Every day | ORAL | Status: DC

## 2015-06-13 NOTE — Progress Notes (Signed)
  [facility] :  Will you be returning to the same living situation after discharge:  No. At discharge, do you have transportation home?: Yes,  Patient provided with train ticket Do you have the ability to pay for your medications: Yes,  patient will be provided with prescriptions and medication samples at discharge  Release of information consent forms completed and in the chart;  [name] needed at discharge.  Patient to Follow up at: Follow-up Information    [facility] up with [facility] On 06/16/2015.   Why:  Assessment for therapy and medication management services on Monday Oct. 24th at 1pm. This agency specializes in working with homeless individuals. Bring your ID, letter from shelter stating that you are residing there, and a copy of your prescriptions.   Contact information:   [address] [facility] [phone]      Patient denies SI/HI: Yes,  denies    Aeronautical engineerafety Planning and Suicide Prevention discussed: Yes,  with patient  Have you used any form of tobacco in the last 30 days? (Cigarettes, Smokeless Tobacco, Cigars, and/or Pipes): No  Has patient been referred to the Quitline?: N/A patient is not a smoker  Bruce [name] L 06/13/2015, 10:22 AM

## 2015-06-13 NOTE — Progress Notes (Signed)
Recreation Therapy Notes  Date: 10.21.2016 Time: 9:30am Location: [address] Group Room   Group Topic: Stress Management  Goal Area(s) Addresses:  Patient will actively participate in stress management techniques presented during session.   Behavioral Response: Did not attend.     Ritaj Dullea L Tasean Mancha, LRT/CTRS        Sadi Arave L 06/13/2015 1:39 PM 
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
## 2015-06-13 NOTE — BHH Suicide Risk Assessment (Signed)
[facility]   Demographic Factors:  39 year old man, single, no children, currently homeless  Total Time spent with patient: 30 minutes  Musculoskeletal: Strength & Muscle Tone: within normal limits Gait & Station: normal Patient leans: N/A  Psychiatric Specialty Exam: Physical Exam  ROS  Blood pressure 123/82, pulse 83, temperature 98.4 F (36.9 C), temperature source Oral, resp. rate 24, height 5\' 7"  (1.702 m), weight 350 lb (158.759 kg), SpO2 100 %.Body mass index is 54.8 kg/(m^2).  General Appearance: improved grooming  Eye Contact::  Good  Speech:  Normal Rate409  Volume:  Normal  Mood:  improved and currently euthymic   Affect:  Appropriate and fuller in range  Thought Process:  Linear  Orientation:  Full (Time, Place, and Person)  Thought Content:  states hallucinations have improved significantly , have " quieted down" and states " they are not bothering me right now". No delusions expressed   Suicidal Thoughts:  No- at this time denies any suicidal ideations, denies any thoughts of hurting self , contracts for safety  Homicidal Thoughts:  No- at present denies any violent or homicidal ideations towards anyone   Memory:  recent and remote grossly intact   Judgement:  Other:  improved   Insight:  improved   Psychomotor Activity:  Normal  Concentration:  Good  Recall:  Good  Fund of Knowledge:Good  Language: Good  Akathisia:  Negative  Handed:  Right  AIMS (if indicated):     Assets:  Desire for Improvement Resilience  Sleep:  Number of Hours: 5.75  Cognition: WNL  ADL's:  Improved    Have you used any form of tobacco in the last 30 days? (Cigarettes, Smokeless Tobacco, Cigars, and/or Pipes): No  Has this patient used any form of tobacco in the last 30 days? (Cigarettes, Smokeless Tobacco, Cigars, and/or Pipes) No  Mental Status Per Nursing Assessment::   On Admission:  Suicidal ideation indicated by patient  Current Mental Status by  Physician: At this time patient is improved compared to admission- mood is described as better and not depressed at present, affect is appropriate, reactive, not irritable, no thought disorder, denies any suicidal ideations, denies any homicidal ideations, states that auditory hallucinations have decreased dramatically and are no longer bothering him- does not appear internally preoccupied, no delusions are expressed, future oriented.  Loss Factors: Homelessness, limited support network   Historical Factors: Prior psychiatric admissions, has been diagnosed with Schizophrenia in the past, history of alcohol abuse . History of suicide attempt by overdosing one year ago  Risk Reduction Factors:   Sense of responsibility to family and Positive coping skills or problem solving skills  Continued Clinical Symptoms:  As noted, currently significantly improved compared to admission.   Cognitive Features That Contribute To Risk:  No gross cognitive deficits noted upon discharge. Is alert , attentive, and oriented x 3    Suicide Risk:  Mild:  Suicidal ideation of limited frequency, intensity, duration, and specificity.  There are no identifiable plans, no associated intent, mild dysphoria and related symptoms, good self-control (both objective and subjective assessment), few other risk factors, and identifiable protective factors, including available and accessible social support.  Principal Problem: Schizophrenia, residual, subchronic, with acute exacerbation [facility](HCC) Discharge Diagnoses:  Patient Active Problem List   Diagnosis Date Noted  . Suicidal behavior [F48.9] 06/05/2015  . Alcohol dependence (HCC) [F10.20] 11/20/2013  . Schizophrenia, residual, subchronic, with acute exacerbation (HCC) [F20.5] 11/18/2013    Follow-up Information  [facility] On 06/16/2015.   Why:  Assessment for therapy and medication management services on Monday Oct. 24th at  1pm. This agency specializes in working with homeless individuals. Bring your ID, letter from shelter stating that you are residing there, and a copy of your prescriptions.   Contact information:   [address] [place] [phone]      Plan Of Care/Follow-up recommendations:  Activity:  as tolerated  Diet:  regular Tests:  NA Other:  see below  Is patient on multiple antipsychotic therapies at discharge:  No   Has Patient had three or more failed trials of antipsychotic monotherapy by history:  No  Recommended Plan for Multiple Antipsychotic Therapies: NA   Patient is leaving unit in good spirits. Plans to follow up as above. Plans to go to [facility]    [place], [place] 06/13/2015, 10:57 AM

## 2015-06-13 NOTE — Clinical Social Work Note (Signed)
Train ticket placed on patient's chart.  Samuella BruinKristin Nils Thor, MSW, [employer]CSWA Clinical Social Worker [facility] [phone]

## 2015-06-13 NOTE — Progress Notes (Signed)
Writer received call from MD [facility] at [facility], [facility], who stated that [facility] was [facility] by [facility] enforcement, stated that he was [facility] [place] today, on 06/13/15, [facility].  [place] Abtsatia Jeanann Balinski, LCSWA Disposition staff 06/13/2015 8:31 PM

## 2015-06-13 NOTE — BHH Group Notes (Signed)
[facility] Dis[facility] Note  06/13/2015 8:45 AM  Participation Quality: Alert, Appropriate and Oriented  Mood/Affect: Flat  Depression Rating: 5  Anxiety Rating: 5  Thoughts of Suicide: Pt denies SI/HI  Will you contract for safety? Yes  Current AVH: Pt denies  Plan for Discharge/Comments: Pt attended discharge planning group and actively participated in group. CSW discussed suicide prevention education with the group and encouraged them to discuss discharge planning and any relevant barriers. Pt reports feeling both excited and anxious for discharge [facility].  Transportation Means: Pt reports access to transportation  Supports: No supports mentioned at this time  Chad CordialLauren Carter, LCSWA 06/13/2015 9:37 AM

## 2015-06-13 NOTE — Progress Notes (Signed)
Discharge note:  Patient discharged per MD order.  Patient received all personal belongings, prescriptions and medication samples (7 day supply).  Reviewed all medications and patient indicated understanding.  Patient denies SI/HI/AVH.  Patient left with bus pass.  He plans to [place] the [place] [facility].  He left ambulatory without incident.

## 2015-06-30 ENCOUNTER — Encounter (HOSPITAL_COMMUNITY): Payer: Self-pay | Admitting: *Deleted

## 2015-06-30 ENCOUNTER — Emergency Department (HOSPITAL_COMMUNITY)
Admission: EM | Admit: 2015-06-30 | Discharge: 2015-07-01 | Disposition: A | Discharge status: Other Acute Inpatient Hospital | Payer: No Typology Code available for payment source | Attending: Emergency Medicine | Admitting: Emergency Medicine

## 2015-06-30 DIAGNOSIS — T1491 Suicide attempt: Secondary | ICD-10-CM | POA: Diagnosis not present

## 2015-06-30 DIAGNOSIS — F329 Major depressive disorder, single episode, unspecified: Secondary | ICD-10-CM | POA: Insufficient documentation

## 2015-06-30 DIAGNOSIS — F209 Schizophrenia, unspecified: Secondary | ICD-10-CM | POA: Insufficient documentation

## 2015-06-30 DIAGNOSIS — Y998 Other external cause status: Secondary | ICD-10-CM | POA: Diagnosis not present

## 2015-06-30 DIAGNOSIS — E669 Obesity, unspecified: Secondary | ICD-10-CM | POA: Diagnosis not present

## 2015-06-30 DIAGNOSIS — Y9289 Other specified places as the place of occurrence of the external cause: Secondary | ICD-10-CM | POA: Insufficient documentation

## 2015-06-30 DIAGNOSIS — Z79899 Other long term (current) drug therapy: Secondary | ICD-10-CM | POA: Insufficient documentation

## 2015-06-30 DIAGNOSIS — R45851 Suicidal ideations: Secondary | ICD-10-CM | POA: Diagnosis present

## 2015-06-30 DIAGNOSIS — Y9389 Activity, other specified: Secondary | ICD-10-CM | POA: Diagnosis not present

## 2015-06-30 DIAGNOSIS — T1491XA Suicide attempt, initial encounter: Secondary | ICD-10-CM

## 2015-06-30 HISTORY — DX: Obesity, unspecified: E66.9

## 2015-06-30 LAB — ACETAMINOPHEN LEVEL: Acetaminophen (Tylenol), Serum: <10 ug/mL — ABNORMAL LOW (ref 10–30)

## 2015-06-30 LAB — CBC
HEMATOCRIT: 42.8 % (ref 39.0–52.0)
HEMOGLOBIN: 13.8 g/dL (ref 13.0–17.0)
MCH: 28.9 pg (ref 26.0–34.0)
MCHC: 32.2 g/dL (ref 30.0–36.0)
MCV: 89.7 fL (ref 78.0–100.0)
Platelets: 270 10*3/uL (ref 150–400)
RBC: 4.77 MIL/uL (ref 4.22–5.81)
RDW: 13.7 % (ref 11.5–15.5)
WBC: 7.6 10*3/uL (ref 4.0–10.5)

## 2015-06-30 LAB — COMPREHENSIVE METABOLIC PANEL
ALBUMIN: 3.6 g/dL (ref 3.5–5.0)
ALK PHOS: 70 U/L (ref 38–126)
ALT: 37 U/L (ref 17–63)
AST: 30 U/L (ref 15–41)
Anion gap: 10 (ref 5–15)
BUN: 17 mg/dL (ref 6–20)
CALCIUM: 9.9 mg/dL (ref 8.9–10.3)
CHLORIDE: 101 mmol/L (ref 101–111)
CO2: 29 mmol/L (ref 22–32)
CREATININE: 0.86 mg/dL (ref 0.61–1.24)
GFR calc Af Amer: >60 mL/min (ref >60)
GFR calc non Af Amer: >60 mL/min (ref >60)
GLUCOSE: 91 mg/dL (ref 65–99)
Potassium: 4.4 mmol/L (ref 3.5–5.1)
SODIUM: 140 mmol/L (ref 135–145)
Total Bilirubin: 0.5 mg/dL (ref 0.3–1.2)
Total Protein: 7.8 g/dL (ref 6.5–8.1)

## 2015-06-30 LAB — RAPID URINE DRUG SCREEN, HOSP PERFORMED
AMPHETAMINES: NOT DETECTED
BARBITURATES: NOT DETECTED
Benzodiazepines: NOT DETECTED
COCAINE: NOT DETECTED
OPIATES: NOT DETECTED
TETRAHYDROCANNABINOL: NOT DETECTED

## 2015-06-30 LAB — SALICYLATE LEVEL: Salicylate Lvl: <4 mg/dL (ref 2.8–30.0)

## 2015-06-30 LAB — ETHANOL: Alcohol, Ethyl (B): <5 mg/dL (ref <5)

## 2015-06-30 MED ORDER — IBUPROFEN 200 MG PO TABS: 200.0000 mg | ORAL_TABLET | Freq: Four times a day (QID) | ORAL | Status: DC | PRN

## 2015-06-30 MED ORDER — FLUOXETINE HCL 20 MG PO CAPS
20.0000 mg | ORAL_CAPSULE | Freq: Every day | ORAL | Status: DC
  Administered 2015-06-30: 20 mg via ORAL
  Filled 2015-06-30 (×2): qty 1

## 2015-06-30 MED ORDER — BENZTROPINE MESYLATE 1 MG PO TABS
0.5000 mg | ORAL_TABLET | Freq: Two times a day (BID) | ORAL | Status: DC
  Administered 2015-06-30: 0.5 mg via ORAL
  Filled 2015-06-30: qty 1

## 2015-06-30 MED ORDER — ADULT MULTIVITAMIN W/MINERALS CH
1.0000 | ORAL_TABLET | Freq: Every day | ORAL | Status: DC
  Administered 2015-06-30: 1 via ORAL
  Filled 2015-06-30: qty 1

## 2015-06-30 MED ORDER — HALOPERIDOL 5 MG PO TABS: 5.0000 mg | ORAL_TABLET | Freq: Every morning | ORAL | Status: DC

## 2015-06-30 MED ORDER — HALOPERIDOL 2 MG PO TABS
7.0000 mg | ORAL_TABLET | Freq: Every day | ORAL | Status: DC
  Administered 2015-06-30: 7 mg via ORAL
  Filled 2015-06-30 (×2): qty 1

## 2015-06-30 MED ORDER — TRAZODONE HCL 100 MG PO TABS
100.0000 mg | ORAL_TABLET | Freq: Every evening | ORAL | Status: DC | PRN
  Administered 2015-06-30: 100 mg via ORAL
  Filled 2015-06-30: qty 1

## 2015-06-30 NOTE — ED Notes (Signed)
Patient ambulated without distress to rest room. Gait slow, steady. No distress noted.

## 2015-06-30 NOTE — BH Assessment (Signed)
Tele Assessment Note   Patient is a 39 year old African American male that reports SI with a plan to shot himself with his friends 45 caliper gun.  Patient reports that he attempted to kill himself today but the gun jammed.   Patient repots hearing voices telling him to kill himself and others. Patient reports that he has not been compliant with taking his psychiatric medication.  Patient reports that he was diagnosed with Schizophrenia in his early 20's.    Patient reports that he was recently fired from his job at the [employer] due to his past criminal record.  Patient would not state what his criminal record was.  Patient reports that he is currently homeless and he is not able to speak to his mother because she is in an assisted living facility and her guardian will not allow him to speak to her.    Patient denies substance abuse.  Patient denies physical, sexual and emotional abuse.  Patient reports a history of psychiatric hospitalizations.  Patient reports that he is not compliant with taking his psychiatric medication.      Diagnosis: Schizophrenia   Past Medical History:  Past Medical History  Diagnosis Date  . Depression   . Schizophrenia (HCC)   . Obesity     History reviewed. No pertinent past surgical history.  Family History:  Family History  Problem Relation Age of Onset  . Schizophrenia Mother     Social History:  reports that he has never smoked. He does not have any smokeless tobacco history on file. He reports that he drinks about 16.8 oz of alcohol per week. He reports that he does not use illicit drugs.  Additional Social History:     CIWA: CIWA-Ar BP: 120/77 mmHg Pulse Rate: 85 COWS:    PATIENT STRENGTHS: (choose at least two) Average or above average intelligence Capable of independent living Communication skills Work skills  Allergies: No Known Allergies  Home Medications:  (Not in a hospital admission)  OB/GYN Status:  No LMP for male  patient.  General Assessment Data Location of Assessment: [facility] ED TTS Assessment: In system Is this a Tele or Face-to-Face Assessment?: Tele Assessment Is this an Initial Assessment or a Re-assessment for this encounter?: Initial Assessment Marital status: Single Maiden name: na Is patient pregnant?: No Pregnancy Status: No Living Arrangements:  (Homeless) Can pt return to current living arrangement?: Yes Admission Status: Voluntary Is patient capable of signing voluntary admission?: Yes Referral Source: Self/Family/Friend Insurance type: Oceanographer Exam [facility] Walk-in ONLY) Medical Exam completed: Yes  Crisis Care Plan Living Arrangements:  (Homeless) Name of Psychiatrist: None Repoted Name of Therapist: None Reported  Education Status Is patient currently in school?: No Current Grade: NA Highest grade of school patient has completed: NA Name of school: NA Contact person: NA  Risk to self with the past 6 months Suicidal Ideation: Yes-Currently Present Has patient been a risk to self within the past 6 months prior to admission? : Yes Suicidal Intent: Yes-Currently Present Has patient had any suicidal intent within the past 6 months prior to admission? : Yes Is patient at risk for suicide?: Yes Suicidal Plan?: Yes-Currently Present Has patient had any suicidal plan within the past 6 months prior to admission? : Yes Specify Current Suicidal Plan: Shot himself with a 45 gun Access to Means: Yes Specify Access to Suicidal Means: Friend has a gun What has been your use of drugs/alcohol within the last 12 months?: None Reported Previous  Attempts/Gestures: Yes How many times?: 5 Other Self Harm Risks: None Reported Triggers for Past Attempts: Unpredictable Intentional Self Injurious Behavior: None Family Suicide History: No Recent stressful life event(s): Job Loss, Financial Problems Persecutory voices/beliefs?: Yes Depression: Yes Depression Symptoms:  Despondent, Isolating, Fatigue, Loss of interest in usual pleasures, Feeling worthless/self pity Substance abuse history and/or treatment for substance abuse?: No Suicide prevention information given to non-admitted patients: Yes  Risk to Others within the past 6 months Homicidal Ideation: Yes-Currently Present Does patient have any lifetime risk of violence toward others beyond the six months prior to admission? : Yes (comment) Thoughts of Harm to Others: Yes-Currently Present Comment - Thoughts of Harm to Others: Voices telling him to kill others Current Homicidal Intent: Yes-Currently Present Current Homicidal Plan: No Access to Homicidal Means: No Identified Victim: NA History of harm to others?: No Assessment of Violence: None Noted Violent Behavior Description: Calm during the assessment Does patient have access to weapons?: Yes (Comment) Criminal Charges Pending?: No Does patient have a court date: No Is patient on probation?: No  Psychosis Hallucinations: Auditory Delusions: None noted  Mental Status Report Appearance/Hygiene: In hospital gown Eye Contact: Good Motor Activity: Freedom of movement Speech: Logical/coherent Level of Consciousness: Quiet/awake Mood: Depressed, Anxious Affect: Depressed Anxiety Level: Minimal Thought Processes: Coherent, Relevant Judgement: Unimpaired Orientation: Person, Place, Time, Situation Obsessive Compulsive Thoughts/Behaviors: None  Cognitive Functioning Concentration: Decreased Memory: Recent Intact, Remote Intact IQ: Average Insight: Fair Impulse Control: Fair Appetite: Fair Weight Loss: 0 Weight Gain: 0 Sleep: Decreased Total Hours of Sleep: 5 Vegetative Symptoms: Decreased grooming, Not bathing, Staying in bed  ADLScreening [facility] Assessment Services) Patient's cognitive ability adequate to safely complete daily activities?: Yes Patient able to express need for assistance with ADLs?: Yes Independently performs  ADLs?: Yes (appropriate for developmental age)  Prior Inpatient Therapy Prior Inpatient Therapy: Yes Prior Therapy Dates: Multiple Prior Therapy [facility] Reason for Treatment: Depression, SI  Prior Outpatient Therapy Prior Outpatient Therapy: Yes Prior Therapy Dates: UTA [facility] Reason for Treatment: Depression Does patient have an ACCT team?: No Does patient have Intensive In-House Services?  : No Does patient have [facility] services? : No Does patient have P4CC services?: No  ADL Screening (condition at time of admission) Patient's cognitive ability adequate to safely complete daily activities?: Yes Patient able to express need for assistance with ADLs?: Yes Independently performs ADLs?: Yes (appropriate for developmental age)             Merchant navy officerAdvance Directives (For Healthcare) Does patient have an advance directive?: No Nutrition Screen- [facility] Adult/WL/AP Patient's home diet: Regular  Additional Information 1:1 In Past 12 Months?: Yes CIRT Risk: No Elopement Risk: No Does patient have medical clearance?: Yes     Disposition: Per Vernona RiegerLaura, NP - patient meets criteria for inpatient hospitalization.  CSW will seek placement.  Disposition Initial Assessment Completed for this Encounter: Yes Disposition of Patient: Inpatient treatment program Type of inpatient treatment program: Adult  Linton RumpStevenson, [place] Mallozzi [place] 06/30/2015 6:59 PM

## 2015-06-30 NOTE — ED Notes (Signed)
Informed by sitter that she will be 'calling out' and leaving at 2300. Called staffing office to notify and request replacement sitter. Staffing office aware.

## 2015-06-30 NOTE — ED Notes (Signed)
TTS in progress 

## 2015-06-30 NOTE — ED Notes (Signed)
[facility] called and reports that pt meets inpatient criteria.  CSW will work on placement.

## 2015-06-30 NOTE — BH Assessment (Signed)
Per laura, NP - patient meets criteria for inpatient hospitalization.  CSW will seek placement.  Writer informed the nurse of the disposition.

## 2015-06-30 NOTE — Progress Notes (Signed)
Patient [facility] per Lamar LaundrySonya, fax referral for review. [facility] - per Olegario MessierKathy, fax referral over. [facility] - per Alona BeneJoyce, d/c tomorrow, fax over referral. [facility] - per intake, fax referral for the waitlist. [facility] - per French Anaracy, 1 male adult bed open, fax referral. [facility] - per intake, fax referral.  At capacity: [facility] - per Lavon [facility] - per [facility]   CSW will continue to seek placement.  [place] Abtsatia Ensley Blas, LCSWA Disposition staff 06/30/2015 9:01 PM

## 2015-06-30 NOTE — ED Provider Notes (Signed)
CSN: [id number]     Arrival date & time 06/30/15  1603 History   First MD Initiated Contact with Patient 06/30/15 1737     Chief Complaint  Patient presents with  . Suicidal  . Medical Clearance     (Consider location/radiation/quality/duration/timing/severity/associated sxs/prior Treatment) HPI Comments: 39 y.o. Male with history of schizophrenia, suicidal behavior presents for suicidal and homicidal ideation.  Patient states he put a gun in his mouth today and pulled the trigger but nothing happened.  He says he wishes the gun had gone off.  Denies any ingestion.  Reports that he needs pscyhiatric help.     Past Medical History  Diagnosis Date  . Depression   . Schizophrenia (HCC)   . Obesity    History reviewed. No pertinent past surgical history. Family History  Problem Relation Age of Onset  . Schizophrenia Mother    Social History  Substance Use Topics  . Smoking status: Never Smoker   . Smokeless tobacco: None  . Alcohol Use: 16.8 oz/week    28 Cans of beer per week    Review of Systems  Constitutional: Negative for fever, chills and fatigue.  HENT: Negative for congestion and postnasal drip.   Eyes: Negative for pain and visual disturbance.  Respiratory: Negative for chest tightness and wheezing.   Cardiovascular: Negative for chest pain and palpitations.  Gastrointestinal: Negative for nausea, abdominal pain, diarrhea and constipation.  Genitourinary: Negative for dysuria, urgency and hematuria.  Musculoskeletal: Negative for myalgias and back pain.  Skin: Negative for rash.  Psychiatric/Behavioral: Positive for suicidal ideas, self-injury and dysphoric mood. Negative for hallucinations and confusion.      Allergies  Review of patient's allergies indicates no known allergies.  Home Medications   Prior to Admission medications   Medication Sig Start Date End Date Taking? Authorizing Provider  ibuprofen (ADVIL,MOTRIN) 200 MG tablet Take 200 mg by mouth  every 6 (six) hours as needed for mild pain or moderate pain.   Yes Historical Provider, MD  benztropine (COGENTIN) 0.5 MG tablet Take 1 tablet (0.5 mg total) by mouth 2 (two) times daily. Patient not taking: Reported on 06/30/2015 06/13/15   Beau Fanny, FNP  FLUoxetine (PROZAC) 20 MG capsule Take 1 capsule (20 mg total) by mouth daily. Patient not taking: Reported on 06/30/2015 06/13/15   Beau Fanny, FNP  haloperidol (HALDOL) 1 MG tablet Take 7 tablets (7 mg total) by mouth at bedtime. Patient not taking: Reported on 06/30/2015 06/13/15   Beau Fanny, FNP  haloperidol (HALDOL) 5 MG tablet Take 1 tablet (5 mg total) by mouth every morning. Patient not taking: Reported on 06/30/2015 06/13/15   Beau Fanny, FNP  Multiple Vitamin (MULTIVITAMIN WITH MINERALS) TABS tablet Take 1 tablet by mouth daily. May purchase over the counter. Patient not taking: Reported on 06/30/2015 06/13/15   Beau Fanny, FNP  traZODone (DESYREL) 100 MG tablet Take 1 tablet (100 mg total) by mouth at bedtime as needed for sleep. Patient not taking: Reported on 06/30/2015 06/13/15   Everardo All Withrow, FNP   BP 118/81 mmHg  Pulse 96  Temp(Src) 99.5 F (37.5 C) (Oral)  Resp 20  SpO2 100% Physical Exam  Constitutional: He is oriented to person, place, and time. He appears well-developed and well-nourished. No distress.  HENT:  Head: Normocephalic and atraumatic.  Right Ear: External ear normal.  Left Ear: External ear normal.  Mouth/Throat: Oropharynx is clear and moist. No oropharyngeal exudate.  Eyes: EOM are normal.  Pupils are equal, round, and reactive to light.  Neck: Normal range of motion. Neck supple.  Cardiovascular: Normal rate, regular rhythm, normal heart sounds and intact distal pulses.   No murmur heard. Pulmonary/Chest: Effort normal. No respiratory distress. He has no wheezes. He has no rales.  Abdominal: Soft. He exhibits no distension. There is no tenderness.  Musculoskeletal: He exhibits no  edema.  Neurological: He is alert and oriented to person, place, and time.  Skin: Skin is warm and dry. No rash noted. He is not diaphoretic.  Psychiatric: He is slowed and withdrawn. He expresses homicidal and suicidal ideation. He expresses suicidal plans. He expresses no homicidal plans.  Vitals reviewed.   ED Course  Procedures (including critical care time) Labs Review Labs Reviewed  ACETAMINOPHEN LEVEL - Abnormal; Notable for the following:    Acetaminophen (Tylenol), Serum <10 (*)    All other components within normal limits  COMPREHENSIVE METABOLIC PANEL  ETHANOL  SALICYLATE LEVEL  CBC  URINE RAPID DRUG SCREEN, HOSP PERFORMED    Imaging Review No results found. I have personally reviewed and evaluated these images and lab results as part of my medical decision-making.   EKG Interpretation None      MDM  Patient seen and evaluated at bedside.  Suicide attempt today.  No sign of trauma.  Medically stable.  Labs unremarkable.  TTS consulted and psych hold orders placed. Final diagnoses:  None    1. Suicidal ideation with attempt  2. Homicidal ideation    Leta BaptistEmily Roe Nguyen, MD 06/30/15 1850

## 2015-06-30 NOTE — ED Notes (Signed)
Pt reports being suicidal, made attempt to shoot himself, put gun in his mouth but reports gun misfired. Hx of suicide attempts. Also having HI. Calm and cooperative at this time.

## 2015-07-01 NOTE — ED Provider Notes (Signed)
[facility]  Katlin Ciszewski, MD 07/01/15 [phone]

## 2015-07-01 NOTE — ED Notes (Signed)
Declined W/C at D/C and was escorted to lobby by RN. 

## 2015-07-01 NOTE — Progress Notes (Signed)
Per Misty StanleyLisa at [facility], pt has been accepted for admission by Dr. Roselle LocusSaeed. Admission is voluntary and pt can arrive after 9am. Requests report be called to [phone]. Spoke with [facility] re: [facility].  Ilean SkillMeghan Morrill Bomkamp, MSW, LCSW Clinical Social Work, Disposition  07/01/2015 [phone]

## 2015-09-19 ENCOUNTER — Emergency Department (HOSPITAL_COMMUNITY)
Admission: EM | Admit: 2015-09-19 | Discharge: 2015-09-20 | Disposition: A | Discharge status: Other Acute Inpatient Hospital | Payer: Medicaid Other | Attending: Emergency Medicine | Admitting: Emergency Medicine

## 2015-09-19 ENCOUNTER — Encounter (HOSPITAL_COMMUNITY): Payer: Self-pay

## 2015-09-19 DIAGNOSIS — R45850 Homicidal ideations: Secondary | ICD-10-CM | POA: Diagnosis not present

## 2015-09-19 DIAGNOSIS — R443 Hallucinations, unspecified: Secondary | ICD-10-CM

## 2015-09-19 DIAGNOSIS — F331 Major depressive disorder, recurrent, moderate: Secondary | ICD-10-CM

## 2015-09-19 DIAGNOSIS — F10230 Alcohol dependence with withdrawal, uncomplicated: Secondary | ICD-10-CM

## 2015-09-19 DIAGNOSIS — E669 Obesity, unspecified: Secondary | ICD-10-CM | POA: Insufficient documentation

## 2015-09-19 DIAGNOSIS — R440 Auditory hallucinations: Secondary | ICD-10-CM | POA: Diagnosis not present

## 2015-09-19 DIAGNOSIS — R45851 Suicidal ideations: Secondary | ICD-10-CM | POA: Diagnosis present

## 2015-09-19 LAB — COMPREHENSIVE METABOLIC PANEL
ALT: 24 U/L (ref 17–63)
AST: 23 U/L (ref 15–41)
Albumin: 4 g/dL (ref 3.5–5.0)
Alkaline Phosphatase: 64 U/L (ref 38–126)
Anion gap: 11 (ref 5–15)
BUN: 14 mg/dL (ref 6–20)
CO2: 28 mmol/L (ref 22–32)
Calcium: 10 mg/dL (ref 8.9–10.3)
Chloride: 103 mmol/L (ref 101–111)
Creatinine, Ser: 0.92 mg/dL (ref 0.61–1.24)
GFR calc Af Amer: >60 mL/min (ref >60)
GFR calc non Af Amer: >60 mL/min (ref >60)
Glucose, Bld: 97 mg/dL (ref 65–99)
Potassium: 3.7 mmol/L (ref 3.5–5.1)
Sodium: 142 mmol/L (ref 135–145)
Total Bilirubin: 0.5 mg/dL (ref 0.3–1.2)
Total Protein: 7.9 g/dL (ref 6.5–8.1)

## 2015-09-19 LAB — RAPID URINE DRUG SCREEN, HOSP PERFORMED
Amphetamines: NOT DETECTED
Barbiturates: NOT DETECTED
Benzodiazepines: NOT DETECTED
Cocaine: NOT DETECTED
Opiates: NOT DETECTED
Tetrahydrocannabinol: NOT DETECTED

## 2015-09-19 LAB — CBC
HCT: 40.5 % (ref 39.0–52.0)
Hemoglobin: 13.3 g/dL (ref 13.0–17.0)
MCH: 29.3 pg (ref 26.0–34.0)
MCHC: 32.8 g/dL (ref 30.0–36.0)
MCV: 89.2 fL (ref 78.0–100.0)
Platelets: 219 10*3/uL (ref 150–400)
RBC: 4.54 MIL/uL (ref 4.22–5.81)
RDW: 13.5 % (ref 11.5–15.5)
WBC: 6.5 10*3/uL (ref 4.0–10.5)

## 2015-09-19 LAB — ETHANOL: Alcohol, Ethyl (B): <5 mg/dL (ref <5)

## 2015-09-19 LAB — ACETAMINOPHEN LEVEL: Acetaminophen (Tylenol), Serum: <10 ug/mL — ABNORMAL LOW (ref 10–30)

## 2015-09-19 LAB — SALICYLATE LEVEL: Salicylate Lvl: <4 mg/dL (ref 2.8–30.0)

## 2015-09-19 MED ORDER — THIAMINE HCL 100 MG/ML IJ SOLN: 100.0000 mg | Freq: Every day | INTRAMUSCULAR | Status: DC

## 2015-09-19 MED ORDER — BENZTROPINE MESYLATE 1 MG PO TABS
0.5000 mg | ORAL_TABLET | Freq: Two times a day (BID) | ORAL | Status: DC
  Administered 2015-09-19 (×2): 0.5 mg via ORAL
  Administered 2015-09-20: 11:00:00 via ORAL
  Filled 2015-09-19 (×3): qty 1

## 2015-09-19 MED ORDER — HALOPERIDOL 5 MG PO TABS
5.0000 mg | ORAL_TABLET | Freq: Every morning | ORAL | Status: DC
  Administered 2015-09-20: 5 mg via ORAL
  Filled 2015-09-19: qty 1

## 2015-09-19 MED ORDER — FLUOXETINE HCL 20 MG PO CAPS
20.0000 mg | ORAL_CAPSULE | Freq: Every day | ORAL | Status: DC
  Administered 2015-09-19 – 2015-09-20 (×2): 20 mg via ORAL
  Filled 2015-09-19 (×2): qty 1

## 2015-09-19 MED ORDER — VITAMIN B-1 100 MG PO TABS
100.0000 mg | ORAL_TABLET | Freq: Every day | ORAL | Status: DC
  Administered 2015-09-19 – 2015-09-20 (×2): 100 mg via ORAL
  Filled 2015-09-19 (×2): qty 1

## 2015-09-19 MED ORDER — HALOPERIDOL 5 MG PO TABS
7.0000 mg | ORAL_TABLET | Freq: Every day | ORAL | Status: DC
  Administered 2015-09-19: 7 mg via ORAL
  Filled 2015-09-19: qty 1

## 2015-09-19 MED ORDER — TRAZODONE HCL 100 MG PO TABS
100.0000 mg | ORAL_TABLET | Freq: Every evening | ORAL | Status: DC | PRN
  Administered 2015-09-19: 100 mg via ORAL
  Filled 2015-09-19: qty 1

## 2015-09-19 MED ORDER — LORAZEPAM 1 MG PO TABS: 0.0000 mg | ORAL_TABLET | Freq: Two times a day (BID) | ORAL | Status: DC

## 2015-09-19 MED ORDER — ADULT MULTIVITAMIN W/MINERALS CH
1.0000 | ORAL_TABLET | Freq: Every day | ORAL | Status: DC
  Administered 2015-09-19 – 2015-09-20 (×2): 1 via ORAL
  Filled 2015-09-19 (×2): qty 1

## 2015-09-19 MED ORDER — LORAZEPAM 1 MG PO TABS: 0.0000 mg | ORAL_TABLET | Freq: Four times a day (QID) | ORAL | Status: DC

## 2015-09-19 NOTE — ED Notes (Signed)
Patient is A&Ox4, states that he is hearing voices telling him to kill himself and others and that "there is a special place in hell" for him.  Patient stated he was living with a friend but he recently was kicked out and is currently homeless. Patient states he can contract for safety and will let staff know if he no longer feels he can promise this. Patient is calm and cooperative with a flat affect.   Byrne Capek, Wyman Songster, RN

## 2015-09-19 NOTE — Progress Notes (Signed)
Patient meets inpatient criteria, per Nanine Means, DNP.  Patient was referred to: [facility] - per Aimie, couple beds open. [facility] - per Olegario Messier, will look at referral. [facility] - per [facility] - per intake, fax for the waitlist. [facility] - per Gatha Mayer, male adolescent and geri male and male beds only. Fax referral for waitlist.  At capacity: [facility] - per Gaylan Gerold - per [facility] - per [facility] - per Agu[place].  [place] Abts, LCSWA Disposition staff 09/19/2015 10:13 PM

## 2015-09-19 NOTE — ED Notes (Signed)
Per pt, SI/HI.  Hallucinations auditory to hurt self.  Pt states he was on train tracks but got off.  Denies ingestion or other attempt today.
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
## 2015-09-19 NOTE — ED Provider Notes (Signed)
CSN: [id number]     Arrival date & time 09/19/15  1416 History   First MD Initiated Contact with Patient 09/19/15 1505     Chief Complaint  Patient presents with  . Suicidal  . Homicidal     (Consider location/radiation/quality/duration/timing/severity/associated sxs/prior Treatment) HPI   40 year old male with increasing auditory hallucinations. Patient has a past history of schizophrenia. He is not quite sure why he's had increasing hallucinations recently. He reports compliance with his medications. He has been having command hallucinations to harm himself and others, but no one specifically. He says he was walking along the train tracks with the following down but then subsequently cut off. Denies any substance abuse.  Past Medical History  Diagnosis Date  . Depression   . Schizophrenia (HCC)   . Obesity    History reviewed. No pertinent past surgical history. Family History  Problem Relation Age of Onset  . Schizophrenia Mother    Social History  Substance Use Topics  . Smoking status: Never Smoker   . Smokeless tobacco: None  . Alcohol Use: 16.8 oz/week    28 Cans of beer per week    Review of Systems  All systems reviewed and negative, other than as noted in HPI.   Allergies  Review of patient's allergies indicates no known allergies.  Home Medications   Prior to Admission medications   Medication Sig Start Date End Date Taking? Authorizing Provider  acetaminophen (TYLENOL) 500 MG tablet Take 1,000 mg by mouth every 6 (six) hours as needed for moderate pain or headache.   Yes Historical Provider, MD  benztropine (COGENTIN) 0.5 MG tablet Take 1 tablet (0.5 mg total) by mouth 2 (two) times daily. Patient not taking: Reported on 06/30/2015 06/13/15   Beau Fanny, FNP  FLUoxetine (PROZAC) 20 MG capsule Take 1 capsule (20 mg total) by mouth daily. Patient not taking: Reported on 06/30/2015 06/13/15   Beau Fanny, FNP  haloperidol (HALDOL) 1 MG tablet Take 7  tablets (7 mg total) by mouth at bedtime. Patient not taking: Reported on 06/30/2015 06/13/15   Beau Fanny, FNP  haloperidol (HALDOL) 5 MG tablet Take 1 tablet (5 mg total) by mouth every morning. Patient not taking: Reported on 06/30/2015 06/13/15   Beau Fanny, FNP  Multiple Vitamin (MULTIVITAMIN WITH MINERALS) TABS tablet Take 1 tablet by mouth daily. May purchase over the counter. Patient not taking: Reported on 06/30/2015 06/13/15   Beau Fanny, FNP  traZODone (DESYREL) 100 MG tablet Take 1 tablet (100 mg total) by mouth at bedtime as needed for sleep. Patient not taking: Reported on 06/30/2015 06/13/15   Beau Fanny, FNP   BP 123/77 mmHg  Pulse 117  Temp(Src) 97.9 F (36.6 C) (Oral)  Resp 20  SpO2 100% Physical Exam  Constitutional: He appears well-developed and well-nourished. No distress.  Sitting in chair. No acute distress. Obese.  HENT:  Head: Normocephalic and atraumatic.  Eyes: Conjunctivae are normal. Right eye exhibits no discharge. Left eye exhibits no discharge.  Neck: Neck supple.  Cardiovascular: Normal rate, regular rhythm and normal heart sounds.  Exam reveals no gallop and no friction rub.   No murmur heard. Pulmonary/Chest: Effort normal and breath sounds normal. No respiratory distress.  Abdominal: Soft. He exhibits no distension. There is no tenderness.  Musculoskeletal: He exhibits no edema or tenderness.  Neurological: He is alert.  Skin: Skin is warm and dry.  Psychiatric: He has a normal mood and affect. His behavior is normal.  Thought content normal.  Calm. Cooperative. Affect is flat. Speech is clear. Content is appropriate.  Nursing note and vitals reviewed.   ED Course  Procedures (including critical care time) Labs Review Labs Reviewed  ACETAMINOPHEN LEVEL - Abnormal; Notable for the following:    Acetaminophen (Tylenol), Serum <10 (*)    All other components within normal limits  COMPREHENSIVE METABOLIC PANEL  ETHANOL  SALICYLATE  LEVEL  CBC  URINE RAPID DRUG SCREEN, HOSP PERFORMED    Imaging Review No results found. I have personally reviewed and evaluated these images and lab results as part of my medical decision-making.   EKG Interpretation None      MDM   Final diagnoses:  Hallucinations    41 year old male with increasing hallucinations. Command hallucinations to harm others and himself. We'll medically screen and obtain TTS evaluation.    Raeford Razor, MD 09/19/15 [phone]

## 2015-09-19 NOTE — BH Assessment (Signed)
Assessment completed. Consulted with Nanine Means, DNP who recommends inpatient treatment at this time.   Davina Poke, LCSW Therapeutic Triage Specialist [facility] 09/19/2015 4:19 PM

## 2015-09-20 ENCOUNTER — Encounter (HOSPITAL_COMMUNITY): Payer: Self-pay | Admitting: *Deleted

## 2015-09-20 ENCOUNTER — Inpatient Hospital Stay (HOSPITAL_COMMUNITY)
Admission: AD | Admit: 2015-09-20 | Discharge: 2015-10-09 | DRG: 885 | Disposition: A | Discharge status: Home / Self Care | Payer: Federal, State, Local not specified - Other | Source: Intra-hospital | Attending: Psychiatry | Admitting: Psychiatry

## 2015-09-20 DIAGNOSIS — F200 Paranoid schizophrenia: Principal | ICD-10-CM | POA: Diagnosis present

## 2015-09-20 DIAGNOSIS — G4700 Insomnia, unspecified: Secondary | ICD-10-CM | POA: Diagnosis present

## 2015-09-20 DIAGNOSIS — F331 Major depressive disorder, recurrent, moderate: Secondary | ICD-10-CM | POA: Diagnosis not present

## 2015-09-20 DIAGNOSIS — Z818 Family history of other mental and behavioral disorders: Secondary | ICD-10-CM

## 2015-09-20 DIAGNOSIS — F209 Schizophrenia, unspecified: Secondary | ICD-10-CM | POA: Diagnosis present

## 2015-09-20 DIAGNOSIS — Z590 Homelessness: Secondary | ICD-10-CM

## 2015-09-20 DIAGNOSIS — R45851 Suicidal ideations: Secondary | ICD-10-CM | POA: Diagnosis present

## 2015-09-20 DIAGNOSIS — Z6841 Body Mass Index (BMI) 40.0 and over, adult: Secondary | ICD-10-CM

## 2015-09-20 DIAGNOSIS — E221 Hyperprolactinemia: Secondary | ICD-10-CM | POA: Clinically undetermined

## 2015-09-20 DIAGNOSIS — F419 Anxiety disorder, unspecified: Secondary | ICD-10-CM | POA: Diagnosis present

## 2015-09-20 DIAGNOSIS — F1020 Alcohol dependence, uncomplicated: Secondary | ICD-10-CM | POA: Diagnosis present

## 2015-09-20 MED ORDER — HALOPERIDOL 5 MG PO TABS
7.0000 mg | ORAL_TABLET | Freq: Every day | ORAL | Status: DC
  Administered 2015-09-20: 7 mg via ORAL
  Filled 2015-09-20: qty 1

## 2015-09-20 MED ORDER — MAGNESIUM HYDROXIDE 400 MG/5ML PO SUSP: 30.0000 mL | Freq: Every day | ORAL | Status: DC | PRN

## 2015-09-20 MED ORDER — BENZTROPINE MESYLATE 0.5 MG PO TABS
0.5000 mg | ORAL_TABLET | Freq: Two times a day (BID) | ORAL | Status: DC
  Administered 2015-09-20 – 2015-09-23 (×6): 0.5 mg via ORAL
  Filled 2015-09-20 (×10): qty 1

## 2015-09-20 MED ORDER — FLUOXETINE HCL 20 MG PO CAPS
20.0000 mg | ORAL_CAPSULE | Freq: Every day | ORAL | Status: DC
  Administered 2015-09-21 – 2015-09-23 (×3): 20 mg via ORAL
  Filled 2015-09-20 (×5): qty 1

## 2015-09-20 MED ORDER — ADULT MULTIVITAMIN W/MINERALS CH
1.0000 | ORAL_TABLET | Freq: Every day | ORAL | Status: DC
  Administered 2015-09-21 – 2015-10-09 (×19): 1 via ORAL
  Filled 2015-09-20 (×22): qty 1

## 2015-09-20 MED ORDER — HALOPERIDOL 5 MG PO TABS
5.0000 mg | ORAL_TABLET | Freq: Every morning | ORAL | Status: DC
  Administered 2015-09-21: 5 mg via ORAL
  Filled 2015-09-20: qty 1

## 2015-09-20 MED ORDER — ALUM & MAG HYDROXIDE-SIMETH 200-200-20 MG/5ML PO SUSP: 30.0000 mL | ORAL | Status: DC | PRN

## 2015-09-20 MED ORDER — TRAZODONE HCL 100 MG PO TABS
100.0000 mg | ORAL_TABLET | Freq: Every evening | ORAL | Status: DC | PRN
  Administered 2015-09-20 – 2015-09-22 (×3): 100 mg via ORAL
  Filled 2015-09-20 (×3): qty 1

## 2015-09-20 MED ORDER — ACETAMINOPHEN 325 MG PO TABS
650.0000 mg | ORAL_TABLET | Freq: Four times a day (QID) | ORAL | Status: DC | PRN
  Administered 2015-09-22 – 2015-10-04 (×3): 650 mg via ORAL
  Filled 2015-09-20 (×3): qty 2
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
## 2015-09-20 NOTE — ED Notes (Signed)
Pt resting in bed with no s/s of distress noted.

## 2015-09-20 NOTE — ED Notes (Signed)
Patient admits to [facility] with plan to get hit by a train. Patient also admits to command AH telling him to kill others. Patient denies VH. Plan of care discussed with patient. Patient voices no complaints or concerns at this time. Encouragement and support provided and safety maintain. Q 15 min safety checks remain in place.

## 2015-09-21 DIAGNOSIS — Z6841 Body Mass Index (BMI) 40.0 and over, adult: Secondary | ICD-10-CM | POA: Diagnosis not present

## 2015-09-21 DIAGNOSIS — F419 Anxiety disorder, unspecified: Secondary | ICD-10-CM | POA: Diagnosis present

## 2015-09-21 DIAGNOSIS — G4700 Insomnia, unspecified: Secondary | ICD-10-CM | POA: Diagnosis present

## 2015-09-21 DIAGNOSIS — Z818 Family history of other mental and behavioral disorders: Secondary | ICD-10-CM | POA: Diagnosis not present

## 2015-09-21 DIAGNOSIS — R45851 Suicidal ideations: Secondary | ICD-10-CM | POA: Diagnosis present

## 2015-09-21 DIAGNOSIS — F209 Schizophrenia, unspecified: Secondary | ICD-10-CM | POA: Diagnosis present

## 2015-09-21 DIAGNOSIS — E221 Hyperprolactinemia: Secondary | ICD-10-CM | POA: Diagnosis not present

## 2015-09-21 DIAGNOSIS — F251 Schizoaffective disorder, depressive type: Secondary | ICD-10-CM

## 2015-09-21 DIAGNOSIS — F1020 Alcohol dependence, uncomplicated: Secondary | ICD-10-CM | POA: Diagnosis not present

## 2015-09-21 DIAGNOSIS — F200 Paranoid schizophrenia: Secondary | ICD-10-CM | POA: Diagnosis not present

## 2015-09-21 DIAGNOSIS — R45850 Homicidal ideations: Secondary | ICD-10-CM | POA: Diagnosis not present

## 2015-09-21 DIAGNOSIS — Z590 Homelessness: Secondary | ICD-10-CM | POA: Diagnosis not present

## 2015-09-21 MED ORDER — LOPERAMIDE HCL 2 MG PO CAPS
ORAL_CAPSULE | ORAL | Status: AC
Start: 2015-09-21 — End: 2015-09-21
  Administered 2015-09-21: 2 mg via ORAL
  Filled 2015-09-21: qty 1

## 2015-09-21 MED ORDER — LOPERAMIDE HCL 2 MG PO CAPS
2.0000 mg | ORAL_CAPSULE | ORAL | Status: DC | PRN
  Administered 2015-09-21 – 2015-10-06 (×16): 2 mg via ORAL
  Filled 2015-09-21 (×15): qty 1

## 2015-09-21 MED ORDER — FLUPHENAZINE HCL 5 MG PO TABS
5.0000 mg | ORAL_TABLET | Freq: Two times a day (BID) | ORAL | Status: DC
  Administered 2015-09-21 – 2015-09-23 (×4): 5 mg via ORAL
  Filled 2015-09-21 (×9): qty 1

## 2015-09-21 NOTE — H&P (Addendum)
[name] Admission Assessment Adult  Patient Identification: Bruce [name] MRN:  [id number] Date of Evaluation:  09/21/2015 Chief Complaint:  "I am still hearing voices to kill myself and others."  Principal Diagnosis: Schizophrenia, Depression Diagnosis:   Patient Active Problem List   Diagnosis Date Noted  . Depression, major, recurrent, moderate ([place]) [F33.1] 09/20/2015  . Suicidal behavior [F48.9] 06/05/2015  . Alcohol dependence ([place]) [F10.20] 11/20/2013  . Schizophrenia, residual, subchronic, with acute exacerbation ([place]) [F20.5] 11/18/2013   History of Present Illness:: Bruce [name] is a  40 year  old man, who is known to our unit from prior admissions, who reports worsening depression. He reports auditory hallucinations criticizing and demeaning him and also telling he should kill himself and " to hurt people " ( although denies any HI or violent ideations towards anyone specific ). He describes recent suicidal ideations consisting of walking in front of a train. He states he has been feeling depressed since his grandmother, with whom he was very close, passed away in February/ Nov 17, 2014. Another major stressor is that he is not allowed to communicate with his mother, who is currently living in an [place]. Patient has a history of alcohol dependence but currently his blood alcohol level is negative along with urine drug screen. He reports that he has not been taking his psychotropic medications as prescribed. Patient reports that haldol is ineffective for his symptoms and does not want to continue on this medication. His main issue in the past has been his inability to afford medications. Patient would like to try a different antipsychotic medication and discussed Prolixin which he is open to a trial of.   Associated Signs/Symptoms: Depression Symptoms:  depressed mood, anhedonia, insomnia, suicidal thoughts with specific plan, loss of energy/fatigue, low self  esteem (Hypo) Manic Symptoms:   Denies  Anxiety Symptoms:  States he has " a lot of anxiety, I  worry a lot " Psychotic Symptoms:   Auditory hallucinations , as above .  States he has also been seeing " shadows " at times .  PTSD Symptoms: Does not endorse  Total Time spent with patient: 45 minutes  Past Psychiatric History:  States he has had several psychiatric admissions , states that his most recent admission was about a month and a half ago at [facility] but requests no to be sent back there as they did not "help him."  States he has a long history of psychotic symptoms, which come and go.  States he has hallucinations even at times when he is not feeling particularly depressed . States he has attempted suicide by overdosing. Denies history of violence .  At this time does not endorse any clear history of mania or hypomania . Has been diagnosed with Schizophrenia in the past .  Had been following up at [facility] in [place], but has not returned recently. At this time no active outpatient treatment.   Risk to Self: Is patient at risk for suicide?: Yes Risk to Others:   Prior Inpatient Therapy:   Prior Outpatient Therapy:    Alcohol Screening: Patient refused Alcohol Screening Tool: Yes 1. How often do you have a drink containing alcohol?: Never 9. Have you or someone else been injured as a result of your drinking?: No 10. Has a relative or friend or a doctor or another health worker been concerned about your drinking or suggested you cut down?: No Alcohol Use Disorder Identification Test Final Score (AUDIT): 0 Brief Intervention: Patient declined brief  intervention Substance Abuse History in the last 12 months:   Has a history of alcohol dependence and had been drinking more than a case of beer daily, but has not drank in several months.  History of cannabis abuse, but has not used in " a long time ".  Consequences of Substance Abuse: Reports history of alcohol related black out  Previous  Psychotropic Medications:  Patient remember having been on Seroquel, Risperidone, Prozac, Trazodone- as noted, stopped about one month ago and is not currently taking any psychiatric medications . He does not remember having been on other medications. Psychological Evaluations:  No Past Medical History: Denies any medical illnesses, NKDA, does not smoke   Past Medical History  Diagnosis Date  . Depression   . Schizophrenia ([place])   . Obesity    History reviewed. No pertinent past surgical history. Family History:  Mother has schizophrenia and is now in an ALF , no contact with father since he was a child, no siblings.  Family History  Problem Relation Age of Onset  . Schizophrenia Mother    Family Psychiatric  History: mother has history of schizophrenia and has a history of attempting suicide . States he has no knowledge of paternal family history. 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 Usually stays at shelters  History  Alcohol Use  . 16.8 oz/week  . 28 Cans of beer per week     History  Drug Use No    Social History   Social History  . Marital Status: Single    Spouse Name: N/A  . Number of Children: N/A  . Years of Education: N/A   Social History Main Topics  . Smoking status: Never Smoker   . Smokeless tobacco: None  . Alcohol Use: 16.8 oz/week    28 Cans of beer per week  . Drug Use: No  . Sexual Activity: Not Currently   Other Topics Concern  . None   Social History Narrative   Additional Social History:                         Allergies:  No Known Allergies Lab Results:  Results for orders placed or performed during the hospital encounter of 09/19/15 (from the past 48 hour(s))  Comprehensive metabolic panel     Status: None   Collection Time: 09/19/15  2:56 PM  Result Value Ref Range   Sodium 142 135 - 145 mmol/L   Potassium 3.7 3.5 - 5.1  mmol/L   Chloride 103 101 - 111 mmol/L   CO2 28 22 - 32 mmol/L   Glucose, Bld 97 65 - 99 mg/dL   BUN 14 6 - 20 mg/dL   Creatinine, Ser 0.92 0.61 - 1.24 mg/dL   Calcium 10.0 8.9 - 10.3 mg/dL   Total Protein 7.9 6.5 - 8.1 g/dL   Albumin 4.0 3.5 - 5.0 g/dL   AST 23 15 - 41 U/L   ALT 24 17 - 63 U/L   Alkaline Phosphatase 64 38 - 126 U/L   Total Bilirubin 0.5 0.3 - 1.2 mg/dL   GFR calc non Af Amer >60 >60 mL/min   GFR calc Af Amer >60 >60 mL/min    Comment: (NOTE) The eGFR has been calculated using the CKD EPI equation. This calculation has not been validated in all clinical situations. eGFR's persistently <60 mL/min signify possible  Chronic Kidney Disease.    Anion gap 11 5 - 15  Ethanol (ETOH)     Status: None   Collection Time: 09/19/15  2:56 PM  Result Value Ref Range   Alcohol, Ethyl (B) <5 <5 mg/dL    Comment:        LOWEST DETECTABLE LIMIT FOR SERUM ALCOHOL IS 5 mg/dL FOR MEDICAL PURPOSES ONLY   Salicylate level     Status: None   Collection Time: 09/19/15  2:56 PM  Result Value Ref Range   Salicylate Lvl <9.1 2.8 - 30.0 mg/dL  Acetaminophen level     Status: Abnormal   Collection Time: 09/19/15  2:56 PM  Result Value Ref Range   Acetaminophen (Tylenol), Serum <10 (L) 10 - 30 ug/mL    Comment:        THERAPEUTIC CONCENTRATIONS VARY SIGNIFICANTLY. A RANGE OF 10-30 ug/mL MAY BE AN EFFECTIVE CONCENTRATION FOR MANY PATIENTS. HOWEVER, SOME ARE BEST TREATED AT CONCENTRATIONS OUTSIDE THIS RANGE. ACETAMINOPHEN CONCENTRATIONS >150 ug/mL AT 4 HOURS AFTER INGESTION AND >50 ug/mL AT 12 HOURS AFTER INGESTION ARE OFTEN ASSOCIATED WITH TOXIC REACTIONS.   CBC     Status: None   Collection Time: 09/19/15  2:56 PM  Result Value Ref Range   WBC 6.5 4.0 - 10.5 K/uL   RBC 4.54 4.22 - 5.81 MIL/uL   Hemoglobin 13.3 13.0 - 17.0 g/dL   HCT 40.5 39.0 - 52.0 %   MCV 89.2 78.0 - 100.0 fL   MCH 29.3 26.0 - 34.0 pg   MCHC 32.8 30.0 - 36.0 g/dL   RDW 13.5 11.5 - 15.5 %   Platelets  219 150 - 400 K/uL  Urine rapid drug screen (hosp performed) (Not at [facility])     Status: None   Collection Time: 09/19/15  2:59 PM  Result Value Ref Range   Opiates NONE DETECTED NONE DETECTED   Cocaine NONE DETECTED NONE DETECTED   Benzodiazepines NONE DETECTED NONE DETECTED   Amphetamines NONE DETECTED NONE DETECTED   Tetrahydrocannabinol NONE DETECTED NONE DETECTED   Barbiturates NONE DETECTED NONE DETECTED    Comment:        DRUG SCREEN FOR MEDICAL PURPOSES ONLY.  IF CONFIRMATION IS NEEDED FOR ANY PURPOSE, NOTIFY LAB WITHIN 5 DAYS.        LOWEST DETECTABLE LIMITS FOR URINE DRUG SCREEN Drug Class       Cutoff (ng/mL) Amphetamine      1000 Barbiturate      200 Benzodiazepine   791 Tricyclics       505 Opiates          300 Cocaine          300 THC              50     Metabolic Disorder Labs:  Lab Results  Component Value Date   HGBA1C 5.2 06/07/2015   MPG 103 06/07/2015   No results found for: PROLACTIN Lab Results  Component Value Date   CHOL 153 06/07/2015   TRIG 57 06/07/2015   HDL 47 06/07/2015   CHOLHDL 3.3 06/07/2015   VLDL 11 06/07/2015   LDLCALC 95 06/07/2015    Current Medications: Current Facility-Administered Medications  Medication Dose Route Frequency Provider Last Rate Last Dose  . acetaminophen (TYLENOL) tablet 650 mg  650 mg Oral Q6H PRN Patrecia Pour, NP      . alum & mag hydroxide-simeth (MAALOX/MYLANTA) 200-200-20 MG/5ML suspension 30 mL  30 mL Oral Q4H PRN Theodoro Clock  Leander Rams, NP      . benztropine (COGENTIN) tablet 0.5 mg  0.5 mg Oral BID Patrecia Pour, NP   0.5 mg at 09/21/15 0849  . FLUoxetine (PROZAC) capsule 20 mg  20 mg Oral Daily Patrecia Pour, NP   20 mg at 09/21/15 0849  . fluPHENAZine (PROLIXIN) tablet 5 mg  5 mg Oral BID AC Niel Hummer, NP      . loperamide (IMODIUM) capsule 2 mg  2 mg Oral PRN Niel Hummer, NP   2 mg at 09/21/15 0959  . magnesium hydroxide (MILK OF MAGNESIA) suspension 30 mL  30 mL Oral Daily PRN Patrecia Pour,  NP      . multivitamin with minerals tablet 1 tablet  1 tablet Oral Daily Patrecia Pour, NP   1 tablet at 09/21/15 0849  . traZODone (DESYREL) tablet 100 mg  100 mg Oral QHS PRN Patrecia Pour, NP   100 mg at 09/20/15 2107   PTA Medications: Prescriptions prior to admission  Medication Sig Dispense Refill Last Dose  . acetaminophen (TYLENOL) 500 MG tablet Take 1,000 mg by mouth every 6 (six) hours as needed for moderate pain or headache.   Past Week at Unknown time  . benztropine (COGENTIN) 0.5 MG tablet Take 1 tablet (0.5 mg total) by mouth 2 (two) times daily. (Patient not taking: Reported on 06/30/2015) 60 tablet 0 Not Taking at Unknown time  . FLUoxetine (PROZAC) 20 MG capsule Take 1 capsule (20 mg total) by mouth daily. (Patient not taking: Reported on 06/30/2015) 30 capsule 0 Not Taking at Unknown time  . haloperidol (HALDOL) 1 MG tablet Take 7 tablets (7 mg total) by mouth at bedtime. (Patient not taking: Reported on 06/30/2015) 210 tablet 0 Not Taking at Unknown time  . haloperidol (HALDOL) 5 MG tablet Take 1 tablet (5 mg total) by mouth every morning. (Patient not taking: Reported on 06/30/2015) 30 tablet 0 Not Taking at Unknown time  . Multiple Vitamin (MULTIVITAMIN WITH MINERALS) TABS tablet Take 1 tablet by mouth daily. May purchase over the counter. (Patient not taking: Reported on 06/30/2015)   Not Taking at Unknown time  . traZODone (DESYREL) 100 MG tablet Take 1 tablet (100 mg total) by mouth at bedtime as needed for sleep. (Patient not taking: Reported on 06/30/2015) 14 tablet 0 Not Taking at Unknown time    Musculoskeletal: Strength & Muscle Tone: within normal limits Gait & Station: normal Patient leans: N/A  Psychiatric Specialty Exam: Physical Exam  Review of Systems  Constitutional: Negative.   HENT: Negative.   Eyes: Negative.   Respiratory: Negative.   Cardiovascular: Negative.   Gastrointestinal: Negative.   Genitourinary: Negative.   Musculoskeletal: Negative.    Skin: Negative.   Neurological: Negative.   Endo/Heme/Allergies: Negative.   Psychiatric/Behavioral: Positive for depression, suicidal ideas, hallucinations and substance abuse. Negative for memory loss. The patient is nervous/anxious. The patient does not have insomnia.   All other systems reviewed and are negative.   Blood pressure 134/86, pulse 97, temperature 98.1 F (36.7 C), temperature source Oral, resp. rate 18, height '5\' 8"'  (1.727 m), weight 145.151 kg (320 lb), SpO2 99 %.Body mass index is 48.67 kg/(m^2).  General Appearance: Fairly Groomed  Engineer, water::  Good  Speech:  Normal Rate  Volume:  Decreased  Mood:  Depressed  Affect:  somewhat blunted   Thought Process:  Linear  Orientation:  Other:  fully alert and attentive   Thought Content:  reports  auditory hallucinations, no delusions expressed, does not appear internally preoccupied at this time  Suicidal Thoughts:  Yes.  with intent/plan   Homicidal Thoughts:  Yes.  without intent/plan states auditory hallucinations have told him to " hurt people" in the past , but denies any plans or intentions of HI or violence .   Memory:  recent and remote fair   Judgement:  Fair  Insight:  Fair  Psychomotor Activity:  Decreased- no agitation or restlessness   Concentration:  Good  Recall:  [place] of Knowledge:Good  Language: Good  Akathisia:  Negative  Handed:  Right  AIMS (if indicated):     Assets:  Desire for Improvement Resilience  ADL's:   Fair   Cognition: WNL  Sleep:      Treatment Plan Summary: Daily contact with patient to assess and evaluate symptoms and progress in treatment, Medication management, Plan inpatient admission and medication management as below   Observation Level/Precautions:  Continuous Observation  Laboratory:  Per ED  Psychotherapy:  Milieu, support , groups   Medications:  We discussed options-  He states Risperidone not working for him, and is concerned about potential weight gain from  Seroquel or Zyprexa .  He  states he has never been on Prolixin, and is agreeing to try it.  Start Prolixin 5 mg bid for psychosis.  Will continue Prozac 20 mgrs QDAY  for depression. Cogentin 0.5 mgrs BID to minimize risk of EPS/dystonia .   Consultations:  As needed   Discharge Concerns:  Homelessness, chronic mental illness  Estimated LOS: Less than 48 hours  Other:  Admit to inpatient unit for further stabilization    Elmarie Shiley, NP-C 1/29/201712:04 PM I agree with assessment and plan Geralyn Flash A. Sabra Heck, M.D.

## 2015-09-21 NOTE — Progress Notes (Signed)
Patient did attend the evening speaker AA meeting.  

## 2015-09-21 NOTE — [facility] Group Notes (Signed)
[facility] Group Notes: (Nursing/MHT/Case Management/Adjunct)  Date: 09/21/2015  Time: 2:15 PM  Type of Therapy: Nurse Education  Participation Level: Minimal  Participation Quality: Attentive  Affect: Flat  Cognitive: Lacking  Insight: Limited  Engagement in Group: Limited  Modes of Intervention: Discussion and Education  Summary of Progress/Problems: Group topic was "Healthy Support Systems." Discussed coping skills and learning how to effectively communicate with others and being respectful. Rayshad arrived late to the group as he was just admitted.  He was attentive but did not participate in the conversation.  Norm Parcel Melayna Robarts 09/21/2015, 3:03 PM

## 2015-09-21 NOTE — Tx Team (Addendum)
Initial Interdisciplinary Treatment Plan   PATIENT STRESSORS: Financial difficulties Auditory Hallucinations   PATIENT STRENGTHS: Wellsite geologist fund of knowledge Physical Health   PROBLEM LIST: Problem List/Patient Goals Date to be addressed Date deferred Reason deferred Estimated date of resolution  Auditory Hallucinations      Suicidal ideation            "try to focus" 09/21/15-BC 09/21/15                                    DISCHARGE CRITERIA:  Adequate post-discharge living arrangements Improved stabilization in mood, thinking, and/or behavior Verbal commitment to aftercare and medication compliance  PRELIMINARY DISCHARGE PLAN: Outpatient therapy Placement in alternative living arrangements  PATIENT/FAMIILY INVOLVEMENT: This treatment plan has been presented to and reviewed with the patient, Bruce [name], and/or family member.  The patient and family have been given the opportunity to ask questions and make suggestions.  Leda Quail T 09/21/2015, 3:49 PM

## 2015-09-21 NOTE — Progress Notes (Signed)
Patient ID: Bruce [name], male   DOB: 1975/11/14, 40 y.o.   MRN: [id number]  40 year old male admitted for suicidal ideation with a plan to get hit by a train. He complains of command auditory hallucinations telling him to hurt himself and to hurt others. He denies wanting to hurt a specific person. He has a history of noncompliance with his medications. He has been on haldol in the past but states that it doesn't work for him. He states that abilify works well for him but he was unable to afford it. He has a history of 2 previous suicide attempts. He has a history of schizophrenia and obesity. He states that his mother is his support system but she currently lives in a nursing facility. He is currently homeless and has lived with friends in the past. Consents signed and pt verbalized understanding. Skin visually assessed and belongings searched. Pt oriented to unit. No complaints of pain or discomfort at this time. Q15 min checks maintained. Will continue to monitor pt.

## 2015-09-21 NOTE — Progress Notes (Signed)
Patient accepted to [facility], bed 301/2. Rosey Bath, RN

## 2015-09-21 NOTE — Progress Notes (Signed)
Admission Note:  Patient is 40 year old male presented voluntarily to [facility] reporting suicidal ideations with a plan to get hit by the train. On admission, patient appear worried and tired. Calm and cooperative with the admission process. Patient reports auditory hallucination; voice telling him to kill himself and other people but no one in particular. Patient reported attempted suicide several times in the past years. Patient denies pain and endorses depression.  Denies substance abuse and use of illicit drugs.  A: Skin assessed, no contraband, wound nor bruises noted. POC and unit policies explained and understanding verbalized. Consents obtained. Accepted food and fluids offered.  R: Patient had no additional questions or concerns.

## 2015-09-22 ENCOUNTER — Encounter (HOSPITAL_COMMUNITY): Payer: Self-pay | Admitting: Psychiatry

## 2015-09-22 DIAGNOSIS — F1020 Alcohol dependence, uncomplicated: Secondary | ICD-10-CM | POA: Diagnosis present

## 2015-09-22 DIAGNOSIS — R45850 Homicidal ideations: Secondary | ICD-10-CM

## 2015-09-22 DIAGNOSIS — R45851 Suicidal ideations: Secondary | ICD-10-CM

## 2015-09-22 DIAGNOSIS — E221 Hyperprolactinemia: Secondary | ICD-10-CM

## 2015-09-22 DIAGNOSIS — F200 Paranoid schizophrenia: Principal | ICD-10-CM

## 2015-09-22 MED ORDER — OLANZAPINE 5 MG PO TBDP
5.0000 mg | ORAL_TABLET | Freq: Four times a day (QID) | ORAL | Status: DC | PRN
  Administered 2015-09-25 – 2015-09-27 (×2): 5 mg via ORAL
  Filled 2015-09-22 (×2): qty 1

## 2015-09-22 MED ORDER — OLANZAPINE 10 MG IM SOLR: 5.0000 mg | Freq: Four times a day (QID) | INTRAMUSCULAR | Status: DC | PRN

## 2015-09-22 NOTE — Progress Notes (Signed)
Patient ID: Bruce [name], male   DOB: Apr 24, 1976, 40 y.o.   MRN: [id number]   Pt currently presents with a flat affect and cooperative, guarded behavior. Pt reports to writer "I'm still hearing and seeing things." Pt reports AH to "hurt others and myself." Pt verbally agrees to contact staff if AH worsens or before acting on these hallucinations. Pt is cooperative and pleasant.   Pt provided with medications per providers orders. Pt's labs and vitals were monitored. Pt supported emotionally and encouraged to express concerns and questions. Pt educated on medications. Per consult with MD, pt moved halls for programming purposes.   Pt's safety ensured with 15 minute and environmental checks. Handoff given to nurse on other hall.

## 2015-09-22 NOTE — H&P (Signed)
Psychiatric Admission Assessment Adult  Patient Identification: Bruce [name]  MRN:  [id number]  Date of Evaluation:  09/22/2015  Chief Complaint:  "I am still hearing voices to kill myself and others."   Principal Diagnosis: Schizophrenia, Depression  Diagnosis:   Patient Active Problem List   Diagnosis Date Noted  . Schizophrenia (HCC) [F20.9] 09/21/2015  . 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 Suicidal behavior [F48.9] 06/05/2015  . Alcohol dependence (HCC) [F10.20] 11/20/2013  . Schizophrenia, residual, subchronic, with acute exacerbation (HCC) [F20.5] 11/18/2013   History of Present Illness: Bruce [name] is a 40 year old African-American male. Admitted to [facility] from the [facility] observation unit with complaints of auditory hallucinations telling him to kill himself & others. During this assessment, he reports, "I came to the Observation unit on Saturday or Sunday from the [facility]. The cops brought me to the [facility] ED because I was feeling suicidal. The suicidal thoughts started on Friday, 3 days ago. I have been stressed out, feeling worthless for a while. Things have been working badly against me. I have trouble finding a job & a place to live. I have not worked in a long time. I have been homeless x 6 years. I have been depressed for a while. The [facility] system have tried to treat my depression, but it was not working. I have been on Sertraline, Risperdal & haldol, but they did not help my symptoms. I have not been on my medicines for a while. My depression at this time is #7 & anxiety #7".  Associated Signs/Symptoms:  Depression Symptoms:  depressed mood, anhedonia, insomnia, suicidal thoughts without plan, loss of energy/fatigue, low self esteem  (Hypo) Manic Symptoms:   Denies   Anxiety Symptoms:  States he has " a lot of anxiety, I  worry a lot "  Psychotic Symptoms:   Auditory hallucinations.  States he has also been  seeing " shadows " at times .   PTSD Symptoms: None reported  Total Time spent with patient: 1 hour  Past Psychiatric History:  States he has had several psychiatric admissions , states that his most recent admission was about a month and a half ago at [facility] but requests not to be sent back there as they did not "help him."  States he has a long history of psychotic symptoms, which come and go.  States he has hallucinations even at times when he is not feeling particularly depressed . States he has attempted suicide by overdosing in the past. Denies history of violence .  At this time does not endorse any clear history of mania or hypomania . Has been diagnosed with Schizophrenia in the past .  Had been following up at [facility] in [place], but has not returned recently. At this time no active outpatient treatment.   Risk to Self: Is patient at risk for suicide?: Yes Risk to Others: Yes, but without plan or intent Prior Inpatient Therapy: Yes Prior Outpatient Therapy: Yes  Alcohol Screening: Patient refused Alcohol Screening Tool: Yes 1. How often do you have a drink containing alcohol?: Never 9. Have you or someone else been injured as a result of your drinking?: No 10. Has a relative or friend or a doctor or another health worker been concerned about your drinking or suggested you cut down?: No Alcohol Use Disorder Identification Test Final Score (AUDIT): 0 Brief Intervention: Patient declined brief intervention  Substance Abuse History in the last 12 months:  Has a history of alcohol dependence and had been drinking more than a case of beer daily, but has not drank in several months.  History of cannabis abuse, but has not used in " a long time ".   Consequences of Substance Abuse: Reports history of alcohol related black out.  Previous Psychotropic Medications:  Patient remembers having been on Seroquel, Risperidone, Prozac, Trazodone- as noted, stopped about one month ago and is  not currently taking any psychiatric medications. Says has no money to fill his prescription.  Psychological Evaluations:  No  Past Medical History: Denies any medical illnesses, NKDA, does not smoke   Past Medical History  Diagnosis Date  . Depression   . Schizophrenia (HCC)   . Obesity    History reviewed. No pertinent past surgical history.  Family History:  Mother has schizophrenia and is now in an ALF , no contact with father since he was a child, no siblings.  Family History  Problem Relation Age of Onset  . Schizophrenia Mother    Family Psychiatric  History: Schizophrenia: Mother & maternal aunt.  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 Usually stays at shelters  History  Alcohol Use  . 16.8 oz/week  . 28 Cans of beer per week     History  Drug Use No    Social History   Social History  . Marital Status: Single    Spouse Name: N/A  . Number of Children: N/A  . Years of Education: N/A   Social History Main Topics  . Smoking status: Never Smoker   . Smokeless tobacco: None  . Alcohol Use: 16.8 oz/week    28 Cans of beer per week  . Drug Use: No  . Sexual Activity: Not Currently   Other Topics Concern  . None   Social History Narrative   Additional Social History: Single, no children, unemployed, homeless.   Allergies:  No Known Allergies  Lab Results:  No results found for this or any previous visit (from the past 48 hour(s)).  Metabolic Disorder Labs:  Lab Results  Component Value Date   HGBA1C 5.2 06/07/2015   MPG 103 06/07/2015   No results found for: PROLACTIN Lab Results  Component Value Date   CHOL 153 06/07/2015   TRIG 57 06/07/2015   HDL 47 06/07/2015   CHOLHDL 3.3 06/07/2015   VLDL 11 06/07/2015   LDLCALC 95 06/07/2015   Current Medications: Current Facility-Administered Medications  Medication Dose Route Frequency Provider Last  Rate Last Dose  . acetaminophen (TYLENOL) tablet 650 mg  650 mg Oral Q6H PRN Charm Rings, NP   650 mg at 09/22/15 1610  . alum & mag hydroxide-simeth (MAALOX/MYLANTA) 200-200-20 MG/5ML suspension 30 mL  30 mL Oral Q4H PRN Charm Rings, NP      . benztropine (COGENTIN) tablet 0.5 mg  0.5 mg Oral BID Charm Rings, NP   0.5 mg at 09/22/15 9604  . FLUoxetine (PROZAC) capsule 20 mg  20 mg Oral Daily Charm Rings, NP   20 mg at 09/22/15 5409  . fluPHENAZine (PROLIXIN) tablet 5 mg  5 mg Oral BID AC Thermon Leyland, NP   5 mg at 09/22/15 0620  . loperamide (IMODIUM) capsule 2 mg  2 mg Oral PRN Thermon Leyland, NP   2 mg at 09/22/15 8119  . magnesium hydroxide (MILK OF MAGNESIA) suspension 30 mL  30 mL Oral Daily PRN Charm Rings, NP      . multivitamin with minerals tablet 1 tablet  1 tablet Oral Daily Charm Rings, NP   1 tablet at 09/22/15 [phone]  . traZODone (DESYREL) tablet 100 mg  100 mg Oral QHS PRN Charm Rings, NP   100 mg at 09/21/15 2131   PTA Medications: Prescriptions prior to admission  Medication Sig Dispense Refill Last Dose  . acetaminophen (TYLENOL) 500 MG tablet Take 1,000 mg by mouth every 6 (six) hours as needed for moderate pain or headache.   Past Week at Unknown time  . benztropine (COGENTIN) 0.5 MG tablet Take 1 tablet (0.5 mg total) by mouth 2 (two) times daily. (Patient not taking: Reported on 06/30/2015) 60 tablet 0 Not Taking at Unknown time  . FLUoxetine (PROZAC) 20 MG capsule Take 1 capsule (20 mg total) by mouth daily. (Patient not taking: Reported on 06/30/2015) 30 capsule 0 Not Taking at Unknown time  . haloperidol (HALDOL) 1 MG tablet Take 7 tablets (7 mg total) by mouth at bedtime. (Patient not taking: Reported on 06/30/2015) 210 tablet 0 Not Taking at Unknown time  . haloperidol (HALDOL) 5 MG tablet Take 1 tablet (5 mg total) by mouth every morning. (Patient not taking: Reported on 06/30/2015) 30 tablet 0 Not Taking at Unknown time  . Multiple Vitamin (MULTIVITAMIN  WITH MINERALS) TABS tablet Take 1 tablet by mouth daily. May purchase over the counter. (Patient not taking: Reported on 06/30/2015)   Not Taking at Unknown time  . traZODone (DESYREL) 100 MG tablet Take 1 tablet (100 mg total) by mouth at bedtime as needed for sleep. (Patient not taking: Reported on 06/30/2015) 14 tablet 0 Not Taking at Unknown time   Musculoskeletal: Strength & Muscle Tone: within normal limits Gait & Station: normal Patient leans: N/A  Psychiatric Specialty Exam: Physical Exam  Constitutional: He is oriented to person, place, and time. He appears well-developed and well-nourished.  HENT:  Head: Normocephalic.  Eyes: Pupils are equal, round, and reactive to light.  Neck: Normal range of motion.  Cardiovascular: Normal rate.   Respiratory: Effort normal.  GI: Soft.  Genitourinary:  Denies any issues in this area  Musculoskeletal: Normal range of motion.  Neurological: He is alert and oriented to person, place, and time.  Skin: Skin is warm and dry.  Psychiatric: His speech is normal. His mood appears anxious. His affect is not angry, not blunt, not labile and not inappropriate. He is actively hallucinating. Thought content is paranoid. He expresses impulsivity. He exhibits a depressed mood.    Review of Systems  Constitutional: Negative.   HENT: Negative.   Eyes: Negative.   Respiratory: Negative.   Cardiovascular: Negative.   Gastrointestinal: Negative.   Genitourinary: Negative.   Musculoskeletal: Negative.   Skin: Negative.   Neurological: Negative.   Endo/Heme/Allergies: Negative.   Psychiatric/Behavioral: Positive for depression, suicidal ideas, hallucinations (Auditory) and substance abuse (Hx Alcohol dependence). Negative for memory loss. The patient is nervous/anxious and has insomnia.   All other systems reviewed and are negative.   Blood pressure 122/61, pulse 88, temperature 98.8 F (37.1 C), temperature source Oral, resp. rate 24, height   (1.727 m), weight 174.181 kg (384 lb), SpO2 97 %.Body mass index is 58.4 kg/(m^2).  General Appearance: Fairly Groomed, Obese, calm  Eye Contact::  Good  Speech:  Normal Rate  Volume:  Decreased  Mood:  Depressed  Affect:  Somewhat blunted   Thought Process:  Linear  Orientation:  Other:  fully alert and attentive   Thought Content:  Reports auditory hallucinations, no delusions expressed, does not appear internally preoccupied at this time  Suicidal Thoughts:  Yes.  without intent/plan   Homicidal Thoughts:  Yes.  without intent/plan states auditory hallucinations have told him to " hurt people" in the past , but denies any plans or intentions of HI or violence .   Memory:  Grossly intact  Judgement:  Fair  Insight:  Fair  Psychomotor Activity:  Decreased- No agitation or restlessness, calm   Concentration:  Good  Recall:  Fair  Fund of Knowledge:Good  Language: Good  Akathisia:  Negative  Handed:  Right  AIMS (if indicated):     Assets:  Desire for Improvement Resilience  ADL's:   Fair   Cognition: WNL  Sleep:  Number of Hours: 6.5   Treatment Plan/Recommendations: 1. Admit for crisis management and stabilization, estimated length of stay 3-5 days.  2. Medication management to reduce current symptoms to base line and improve the patient's overall level of functioning; Prozac 20 mg for depression, Prolixin 5 mg for mood control, Cogentin 0.5 mg for EPS, Trazodone 100 mg for insomnia.  3. Treat health problems as indicated.  4. Develop treatment plan to decrease risk of relapse upon discharge and the need for readmission.  5. Psycho-social education regarding relapse prevention and self care.  6. Health care follow up as needed for medical problems.  7. Review, reconcile, and reinstate any pertinent home medications for other health issues where appropriate. 8. Call for consults with hospitalist for any additional specialty patient care services as needed.  Observation  Level/Precautions:  15 minute checks  Laboratory:  Per ED  Psychotherapy:  Milieu, support , groups   Medications: Prozac 20 mg for depression, Prolixin 5 mg for mood control, Cogentin 0.5 mg for EPS, Trazodone 100 mg for insomnia.  Consultations:  As needed   Discharge Concerns:  Homelessness, chronic mental illness  Estimated LOS: 5-7 days  Other: Patient will receive treatment for mood control   Nwoko, Nelda Marseille, PMHNP, FNP-C 1/30/201711:18 AM
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
## 2015-09-22 NOTE — Progress Notes (Signed)
D: Pt +ve SI/HI/AH- contracts for safety.pt stayed in room sleep most of the evening. Pt got up to get night meds.  A: Pt was offered support and encouragement. Pt was given scheduled medications. Pt was encourage to attend groups. Q 15 minute checks were done for safety.  R: Pt is taking medication. Pt has no complaints.Pt receptive to treatment and safety maintained on unit.

## 2015-09-22 NOTE — BHH Group Notes (Signed)
BHH LCSW Group Therapy  09/22/2015 3:04 PM   Type of Therapy:  Group Therapy  Participation Level:  Active  Participation Quality:  Attentive  Affect:  Appropriate  Cognitive:  Appropriate  Insight:  Improving  Engagement in Therapy:  Engaged  Modes of Intervention:  Clarification, Education, Exploration and Socialization  Summary of Progress/Problems: Today's group focused on relapse prevention.  We defined the term, and then brainstormed on ways to prevent relapse. Invited.  Chose to not attend.  Daryel Gerald B 09/22/2015 , 3:04 PM

## 2015-09-22 NOTE — BHH Suicide Risk Assessment (Signed)
[facility] Admission Suicide Risk Assessment   Nursing information obtained from:    Demographic factors:    Current Mental Status:    Loss Factors:    Historical Factors:    Risk Reduction Factors:     Total Time spent with patient: 30 minutes Principal Problem: Schizophrenia (HCC) Diagnosis:   Patient Active Problem List   Diagnosis Date Noted  . Alcohol use disorder, moderate, dependence (HCC) [F10.20] 09/22/2015  . Schizophrenia (HCC) [F20.9] 09/21/2015   Subjective Data: Pt reports worsening depression, suicidal thoughts as well as sleep issues. Pt also with hx of alcohol abuse - reports he has not abused alcohol in the past two months. Pt reports Haldol as not effective. Pt is agreeable to be started on Prozac, prolixin.  Continued Clinical Symptoms:  Alcohol Use Disorder Identification Test Final Score (AUDIT): 0 The "Alcohol Use Disorders Identification Test", Guidelines for Use in Primary Care, Second Edition.  World Science writer [facility]). Score between 0-7:  no or low risk or alcohol related problems. Score between 8-15:  moderate risk of alcohol related problems. Score between 16-19:  high risk of alcohol related problems. Score 20 or above:  warrants further diagnostic evaluation for alcohol dependence and treatment.   CLINICAL FACTORS:   Depression:   Anhedonia Comorbid alcohol abuse/dependence Hopelessness Impulsivity Insomnia Alcohol/Substance Abuse/Dependencies More than one psychiatric diagnosis Currently Psychotic Unstable or Poor Therapeutic Relationship Previous Psychiatric Diagnoses and Treatments   Musculoskeletal: Strength & Muscle Tone: within normal limits Gait & Station: normal Patient leans: N/A  Psychiatric Specialty Exam: Review of Systems  Unable to perform ROS Psychiatric/Behavioral: Positive for depression, suicidal ideas, hallucinations and substance abuse. The patient is nervous/anxious and has insomnia.   All other systems reviewed  and are negative.   Blood pressure 122/61, pulse 88, temperature 98.8 F (37.1 C), temperature source Oral, resp. rate 24, height  (1.727 m), weight 174.181 kg (384 lb), SpO2 97 %.Body mass index is 58.4 kg/(m^2).  General Appearance: Casual  Eye Contact::  Fair  Speech:  Clear and Coherent  Volume:  Normal  Mood:  Anxious and Depressed  Affect:  Congruent  Thought Process:  Coherent  Orientation:  Full (Time, Place, and Person)  Thought Content:  Hallucinations: Auditory Command:  kill self, Paranoid Ideation and Rumination  Suicidal Thoughts:  Yes.  with intent/plan had plan prior to admission  Homicidal Thoughts:  No  Memory:  Immediate;   Fair Recent;   Fair Remote;   Fair  Judgement:  Impaired  Insight:  Lacking  Psychomotor Activity:  Normal  Concentration:  Fair  Recall:  Poor  Fund of Knowledge:Fair  Language: Fair  Akathisia:  No  Handed:  Right  AIMS (if indicated):     Assets:  Desire for Improvement  Sleep:  Number of Hours: 6.5  Cognition: WNL  ADL's:  Intact    COGNITIVE FEATURES THAT CONTRIBUTE TO RISK:  Closed-mindedness, Polarized thinking and Thought constriction (tunnel vision)    SUICIDE RISK:   Severe:  Frequent, intense, and enduring suicidal ideation, specific plan, no subjective intent, but some objective markers of intent (i.e., choice of lethal method), the method is accessible, some limited preparatory behavior, evidence of impaired self-control, severe dysphoria/symptomatology, multiple risk factors present, and few if any protective factors, particularly a lack of social support.  PLAN OF CARE: Patient will benefit from inpatient treatment and stabilization.  Estimated length of stay is 5-7 days.  Reviewed past medical records,treatment plan.  Discussed plan with Aggie NP. Please  see H&P. Will continue to monitor vitals ,medication compliance and treatment side effects while patient is here.  Will monitor for medical issues as well as  call consult as needed.  Reviewed labstsh, lipid panel, hba1c- reviewed from 06/07/15- all wnl. CBC, CMP - wnl, UDS -negative, BAL <5. Will get EKG for qtc. CSW will start working on disposition.  Patient to participate in therapeutic milieu .       I certify that inpatient services furnished can reasonably be expected to improve the patient's condition.   Doxie Augenstein, MD 09/22/2015, 2:51 PM

## 2015-09-22 NOTE — Progress Notes (Signed)
Adult Psychoeducational Group Note  Date:  09/22/2015 Time:  9:14 PM  Group Topic/Focus:  Wrap-Up Group:   The focus of this group is to help patients review their daily goal of treatment and discuss progress on daily workbooks.  Participation Level:  Did Not Attend  Participation Quality:  Did not attend  Affect:  Did not attend  Cognitive:  Did not attend  Insight: None  Engagement in Group:  Did not attend  Modes of Intervention:  Did not attend  Additional Comments:  Patient did not attend wrap up group tonight.   Avabella Wailes L Clayten Allcock 09/22/2015, 9:14 PM

## 2015-09-22 NOTE — Progress Notes (Signed)
Patient stated he does have SI thoughts, no plan, contracts for safety.   Patient also has HI thoughts to no one at [facility], contracts for safety, did not want to discuss.  Denied visual hallucinations.  Stated he does hear voices to hurt himself.  Denied pain.  Respirations even and unlabored.  No signs/symptoms of pain/distress noted on patient's face/body movements.  Safety maintained with 15 minute checks.

## 2015-09-22 NOTE — Plan of Care (Signed)
Problem: Diagnosis: Increased Risk For Suicide Attempt Goal: STG-Patient Will Attend All Groups On The Unit Outcome: Progressing Pt attended evening group on 09/21/15     

## 2015-09-22 NOTE — Tx Team (Addendum)
Interdisciplinary Treatment Plan Update (Adult)  Date:  09/22/2015  Time Reviewed:  8:32 AM   Progress in Treatment: Attending groups: No.Pt new to unit. Continuing to assess.  Participating in groups:  No. Taking medication as prescribed:  Yes. Tolerating medication:  Yes. Family/Significant othe contact made:  SPE required for this pt prior to discharge.  Patient understands diagnosis:  Yes. and As evidenced by:  seeking treatment for SI, AH, depression, and medication stabilization. Discussing patient identified problems/goals with staff:  Yes. Medical problems stabilized or resolved:  Yes. Denies suicidal/homicidal ideation: Yes. Issues/concerns per patient self-inventory:  Other:  Discharge Plan or Barriers: CSW assessing for appropriate referrals. Pt reports that he has no current providers.   Reason for Continuation of Hospitalization: Depression Hallucinations Medication stabilization Suicidal ideation  Comments:  Bruce [name] is an 40 y.o. male presenting to [facility] voluntarily for suicidal ideations with a plan to get hit by a train. Patient states that he was on the train tracks and started to think about how his suicide would impact his mother and he got off of the track. Patient states that he has auditory command hallucinations that trigger his SI. Patient states that the voices also tell him to kill others but there is not a specific person. Patient states that he has experienced hallucinations since his 63s and feels that the hallucinations have increased recently and he is not sure why. Patient states that he has been prescribed Haldol in the past but stopped taking it because he does not feel like it works. Patient states that he Abilify has worked in the past , however, he cannot afford it once he is released from a facility. Patient states that he last took medications about three days ago when he was in another facility, although he cannot remember the name of the  facility. Patient states that he has attempted suicide two tines with the last time being in October of 2016 where he overdosed on pills at his aunts house. He states that he was admitted to [facility] and realized that the pills were "herbal pills." Patient states that he has been to other facilities since that time, including [facility]. Patient denies outpatient treatment stating that he is unable to afford it. Patient endorses HI but denies identified victim, intent, or plan to hurt someone. Patient denies visual hallucinations. Patient denies use of drugs and alcohol. Patient UDS and BAL both clear at time of assessment.. Diagnosis: Schizophrenia Estimated length of stay:  3-5 days   New goal(s): to develop effective aftercare plan.   Additional Comments:  Patient and CSW reviewed pt's identified goals and treatment plan. Patient verbalized understanding and agreed to treatment plan. CSW reviewed [facility] "Discharge Process and Patient Involvement" Form. Pt verbalized understanding of information provided and signed form.    Review of initial/current patient goals per problem list:  1. Goal(s): Patient will participate in aftercare plan  Met: No.   Target date: at discharge  As evidenced by: Patient will participate within aftercare plan AEB aftercare provider and housing plan at discharge being identified.  1/30: CSW assessing. During last admission, pt went to [facility].  2. Goal (s): Patient will exhibit decreased depressive symptoms and suicidal ideations.  Met: No.    Target date: at discharge  As evidenced by: Patient will utilize self rating of depression at 3 or below and demonstrate decreased signs of depression or be deemed stable for discharge by MD.  1/30: Pt rates depression as high. Denies  SI/HI.   3. Goal(s): Patient will demonstrate decreased signs and symptoms of psychosis.   Met:No.   Target date: at discharge  As evidenced by: Patient will  demonstrate decreased signs of paranoia, or be deemed stable for discharge by MD  1/30: Pt continues to report AH telling him to hurt himself and others. He is in the process of being moved to [address] hall due to level of psychosis.   Attendees: Patient:   09/22/2015 8:32 AM   Family:   09/22/2015 8:32 AM   Physician:  Dr. Carlton Adam, MD 09/22/2015 8:32 AM   Nursing:   Natale Milch RN 09/22/2015 8:32 AM   Clinical Social Worker: Maxie Better, LCSW 09/22/2015 8:32 AM   Clinical Social Worker: Erasmo Downer Drinkard LCSWA; Peri Maris LCSWA 09/22/2015 8:32 AM   Other:  Gerline Legacy Nurse Case Manager 09/22/2015 8:32 AM   Other:   09/22/2015 8:32 AM   Other:   09/22/2015 8:32 AM   Other:  09/22/2015 8:32 AM   Other:  09/22/2015 8:32 AM   Other:  09/22/2015 8:32 AM    09/22/2015 8:32 AM    09/22/2015 8:32 AM    09/22/2015 8:32 AM    09/22/2015 8:32 AM    Scribe for Treatment Team:   Maxie Better, LCSW 09/22/2015 8:32 AM

## 2015-09-22 NOTE — Progress Notes (Signed)
D: Pt has depressed affect and mood.  He reports his day has been "rough" and that his goal is to "try to focus."  He reports his day has been rough because "the voices been loud."  Pt reports command auditory hallucinations to harm self and others.  He denies having a plan and he verbally contracts for safety.  Pt denies pain.  Pt has been visible in milieu tonight and he attended evening group.   A: Introduced self to pt.  Met with pt 1:1 and provided support and encouragement. PRN medication administered for diarrhea and sleep. R: Pt is compliant with medications.  Pt verbally contracts for safety.  Will continue to monitor and assess.

## 2015-09-22 NOTE — Plan of Care (Signed)
Problem: Ineffective individual coping Goal: STG: Patient will remain free from self harm Outcome: Progressing Pt safe on the unit at this time     

## 2015-09-23 MED ORDER — BENZTROPINE MESYLATE 0.5 MG PO TABS
0.5000 mg | ORAL_TABLET | Freq: Every day | ORAL | Status: DC
  Administered 2015-09-24 – 2015-09-25 (×2): 0.5 mg via ORAL
  Filled 2015-09-23 (×3): qty 1

## 2015-09-23 MED ORDER — FLUPHENAZINE HCL 10 MG PO TABS
10.0000 mg | ORAL_TABLET | Freq: Every day | ORAL | Status: DC
  Administered 2015-09-23: 10 mg via ORAL
  Filled 2015-09-23 (×2): qty 1

## 2015-09-23 MED ORDER — FLUOXETINE HCL 20 MG PO CAPS
40.0000 mg | ORAL_CAPSULE | Freq: Every day | ORAL | Status: DC
  Administered 2015-09-24: 40 mg via ORAL
  Filled 2015-09-23 (×2): qty 2

## 2015-09-23 MED ORDER — BENZTROPINE MESYLATE 0.5 MG PO TABS
0.5000 mg | ORAL_TABLET | Freq: Every day | ORAL | Status: DC
  Administered 2015-09-23 – 2015-09-25 (×3): 0.5 mg via ORAL
  Filled 2015-09-23 (×5): qty 1

## 2015-09-23 MED ORDER — TRAZODONE HCL 100 MG PO TABS
125.0000 mg | ORAL_TABLET | Freq: Every day | ORAL | Status: DC
  Administered 2015-09-23: 125 mg via ORAL
  Filled 2015-09-23 (×3): qty 1

## 2015-09-23 MED ORDER — FLUPHENAZINE HCL 5 MG PO TABS
5.0000 mg | ORAL_TABLET | Freq: Every day | ORAL | Status: DC
  Administered 2015-09-24 – 2015-09-25 (×2): 5 mg via ORAL
  Filled 2015-09-23 (×3): qty 1

## 2015-09-23 NOTE — [facility] Group Notes (Signed)
[facility] Group Notes:  (Counselor/Nursing/MHT/Case Management/Adjunct)  09/23/2015 1:15PM  Type of Therapy:  Group Therapy  Participation Level:  Active  Participation Quality:  Appropriate  Affect:  Flat  Cognitive:  Oriented  Insight:  Improving  Engagement in Group:  Limited  Engagement in Therapy:  Limited  Modes of Intervention:  Discussion, Exploration and Socialization  Summary of Progress/Problems: The topic for group was balance in life.  Pt participated in the discussion about when their life was in balance and out of balance and how this feels.  Pt discussed ways to get back in balance and short term goals they can work on to get where they want to be. [name] came for the last 10 minutes of group.  Sat quietly.  Attentive.  Stated he knows he is unbalanced because he does not have his usual focus.  Nothing else to share.  After group told me he is interested in seeing if he can get into [facility].   [name] 09/23/2015 12:47 PM

## 2015-09-23 NOTE — Progress Notes (Signed)
D: Pt was laying in bed during the assessment. Pt admitted to having a/h to harm others, but denies any to harming himself. Due to pt's size, Clinical research associate offered him to move to the electric bed so that hob could be adj. Pt informed the writer that he was "ok".  Pt has no questions or concerns.    A:  Support and encouragement was offered. 15 min checks continued for safety.  R: Pt remains safe.

## 2015-09-23 NOTE — Progress Notes (Signed)
Recreation Therapy Notes  01.31.2017  LRT discussed patient appropriateness for pet therapy session, MD & LRT agree patient is appropriate. Patient offered opportunity to participate in pet therapy session, patient declined.   Bruce [name], LRT/CTRS  Jearl Klinefelter 09/23/2015 3:18 PM

## 2015-09-23 NOTE — [facility] Group Notes (Signed)
Adult Psychoeducational Group Note  Date:  09/23/2015 Time:  8:37 PM  Group Topic/Focus:  Wrap-Up Group:   The focus of this group is to help patients review their daily goal of treatment and discuss progress on daily workbooks.  Participation Level:  Did Not Attend  Participation Quality:  None  Affect:  None  Cognitive:  None  Insight: None  Engagement in Group:  None  Modes of Intervention:  Discussion  Additional Comments:  Pt did not attend group.  Caroll Rancher A 09/23/2015, 8:37 PM

## 2015-09-23 NOTE — Progress Notes (Signed)
D:  Patient's self inventory sheet, patient has fair sleep, sleep medication is helpful.  Good appetite, low energy level, poor concentration.  Rated depression 7, hopeless 8, anxiety 6.  Denied withdrawals.  SI almost all the time, contracts for safety.  Denied physical problems.  Denied pain.  Goal is "to think more".  Plans to focus.  Does have discharge plans. A:  Medications administered per MD orders.  Emotional support and encouragement given patient. R:  Patient denied SI this morning while talking to nurse.  Wrote SI on self inventory sheet this morning.  Stated he was HI to others, contracts for safety, not HI to anyone at [facility].  Does hear voices, denied visual hallucinations.  Safety maintained with 15 minute checks.

## 2015-09-23 NOTE — [facility] Group Notes (Signed)
The focus of this group is to educate the patient on the purpose and policies of crisis stabilization and provide a format to answer questions about their admission.  The group details unit policies and expectations of patients while admitted.  Patient did not attend 0900 nurse education orientation group this morning.  Patient stayed in bed.   

## 2015-09-23 NOTE — Plan of Care (Signed)
Problem: Consults Goal: Depression Patient Education See Patient Education Module for education specifics.  Outcome: Progressing Nurse discussed depression/coping skills with patient.        

## 2015-09-23 NOTE — Progress Notes (Signed)
[facility] MD Progress Note  09/23/2015 12:26 PM Brynn Reznik  MRN:  [id number] Subjective: Patient states " I am depressed, I am having voices asking me to kill myself, I also have sleep issues."  Objective: Levar is a 40 year old African-American male. Admitted to [facility] from the [facility] observation unit with complaints of auditory hallucinations telling him to kill himself & others.  Patient seen and chart reviewed.Discussed patient with treatment team.  Pt today seen as withdrawn, depressed, continues to have command AH asking him to kill self , pt also with sleep issues as well as paranoia. Pt also reports SI , denies plan. Per staff - pt continues to be depressed, psychotic, will continue encouragement and support.     Principal Problem: Schizophrenia (HCC) Diagnosis:   Patient Active Problem List   Diagnosis Date Noted  . Alcohol use disorder, moderate, dependence (HCC) [F10.20] 09/22/2015  . Morbid obesity (HCC) [E66.01] 09/22/2015  . Schizophrenia (HCC) [F20.9] 09/21/2015   Total Time spent with patient: 30 minutes  Past Psychiatric History: please see H&P  Past Medical History:  Past Medical History  Diagnosis Date  . Depression   . Schizophrenia (HCC)   . Obesity    Family History: Denies hx of HTN,dm,cardiac disease, thyroid disease in family.  Family History  Problem Relation Age of Onset  . Schizophrenia Mother    Family Psychiatric  History: Mother has schizophrenia. Pt denies substance abuse in family. Social History: Pt is single, is currently homeless,denies legal issues. History  Alcohol Use  . 16.8 oz/week  . 28 Cans of beer per week     History  Drug Use No    Social History   Social History  . Marital Status: Single    Spouse Name: N/A  . Number of Children: N/A  . Years of Education: N/A   Social History Main Topics  . Smoking status: Never Smoker   . Smokeless tobacco: None  . Alcohol Use: 16.8 oz/week    28 Cans of beer per week  . Drug Use:  No  . Sexual Activity: Not Currently   Other Topics Concern  . None   Social History Narrative   Additional Social History:                         Sleep: Poor  Appetite:  Poor  Current Medications: Current Facility-Administered Medications  Medication Dose Route Frequency Provider Last Rate Last Dose  . acetaminophen (TYLENOL) tablet 650 mg  650 mg Oral Q6H PRN Charm Rings, NP   650 mg at 09/23/15 0805  . alum & mag hydroxide-simeth (MAALOX/MYLANTA) 200-200-20 MG/5ML suspension 30 mL  30 mL Oral Q4H PRN Charm Rings, NP      . Melene Muller ON 09/24/2015] benztropine (COGENTIN) tablet 0.5 mg  0.5 mg Oral Daily Jerritt Cardoza, MD      . benztropine (COGENTIN) tablet 0.5 mg  0.5 mg Oral QHS Jomarie Longs, MD      . Melene Muller ON 09/24/2015] FLUoxetine (PROZAC) capsule 40 mg  40 mg Oral Daily Hibba Schram, MD      . fluPHENAZine (PROLIXIN) tablet 10 mg  10 mg Oral QHS Jomarie Longs, MD      . Melene Muller ON 09/24/2015] fluPHENAZine (PROLIXIN) tablet 5 mg  5 mg Oral Daily Lazara Grieser, MD      . loperamide (IMODIUM) capsule 2 mg  2 mg Oral PRN Thermon Leyland, NP   2 mg at 09/22/15  2213  . magnesium hydroxide (MILK OF MAGNESIA) suspension 30 mL  30 mL Oral Daily PRN Charm Rings, NP      . multivitamin with minerals tablet 1 tablet  1 tablet Oral Daily Charm Rings, NP   1 tablet at 09/23/15 [phone]  . OLANZapine zydis (ZYPREXA) disintegrating tablet 5 mg  5 mg Oral Q6H PRN Jomarie Longs, MD       Or  . OLANZapine (ZYPREXA) injection 5 mg  5 mg Intramuscular Q6H PRN Jomarie Longs, MD      . traZODone (DESYREL) tablet 100 mg  100 mg Oral QHS PRN Charm Rings, NP   100 mg at 09/22/15 2205    Lab Results: No results found for this or any previous visit (from the past 48 hour(s)).  Physical Findings: AIMS:  , ,  ,  ,    CIWA:    COWS:     Musculoskeletal: Strength & Muscle Tone: within normal limits Gait & Station: normal Patient leans: N/A  Psychiatric Specialty  Exam: Review of Systems  Psychiatric/Behavioral: Positive for depression, suicidal ideas, hallucinations and substance abuse. The patient is nervous/anxious and has insomnia.   All other systems reviewed and are negative.   Blood pressure 121/80, pulse 83, temperature 98.6 F (37 C), temperature source Oral, resp. rate 22, height  (1.727 m), weight 174.181 kg (384 lb), SpO2 97 %.Body mass index is 58.4 kg/(m^2).  General Appearance: Casual  Eye Contact::  Fair  Speech:  Clear and Coherent  Volume:  Normal  Mood:  Anxious and Depressed  Affect:  Appropriate  Thought Process:  Coherent  Orientation:  Full (Time, Place, and Person)  Thought Content:  Hallucinations: Auditory Command:  kill self, Paranoid Ideation and Rumination  Suicidal Thoughts:  Yes.  without intent/plan  Homicidal Thoughts:  No  Memory:  Immediate;   Fair Recent;   Fair Remote;   Fair  Judgement:  Impaired  Insight:  Shallow  Psychomotor Activity:  Decreased  Concentration:  Poor  Recall:  [employer] of Knowledge:Fair  Language: Fair  Akathisia:  No  Handed:  Right  AIMS (if indicated):     Assets:  Others:  access to healthcare  ADL's:  Intact  Cognition: WNL  Sleep:  Number of Hours: 5.75   Treatment Plan Summary:Kalvin is a 40 year old African-American male,who has a hx of alcohol abuse, schizophrenia, who presented to [facility] with worsening psychosis and SI. Pt today continues to be depressed , suicidal and has command AH. Will continue treatment.  Daily contact with patient to assess and evaluate symptoms and progress in treatment and Medication management  Reviewed past medical records,treatment plan.  Will increase Prozac to 40 mg po daily for affective sx. Will increase Prolixin to 5 mg po daily and 10 mg po qhs for psychosis. Will continue Cogentin 0.5 mg po bid for EPS. Will increase Trazodone to 125 mg po qhs for sleep. Will make available PRN medications as per agitation protocol. Will  continue to monitor vitals ,medication compliance and treatment side effects while patient is here.  Will monitor for medical issues as well as call consult as needed.  Reviewed labs ,PL pending , EKG - wnl qtc. Recreational therapist consult.  CSW will start working on disposition.  Patient to participate in therapeutic milieu .      Serapio Edelson MD 09/23/2015, 12:26 PM

## 2015-09-24 DIAGNOSIS — E221 Hyperprolactinemia: Secondary | ICD-10-CM | POA: Clinically undetermined

## 2015-09-24 LAB — PROLACTIN: Prolactin: 84.1 ng/mL — ABNORMAL HIGH (ref 4.0–15.2)

## 2015-09-24 MED ORDER — FLUPHENAZINE HCL 10 MG PO TABS
10.0000 mg | ORAL_TABLET | Freq: Every day | ORAL | Status: DC
  Administered 2015-09-24: 10 mg via ORAL
  Filled 2015-09-24 (×2): qty 1

## 2015-09-24 MED ORDER — ARIPIPRAZOLE 5 MG PO TABS
5.0000 mg | ORAL_TABLET | Freq: Every evening | ORAL | Status: DC
  Administered 2015-09-24 – 2015-09-25 (×2): 5 mg via ORAL
  Filled 2015-09-24 (×4): qty 1

## 2015-09-24 MED ORDER — FLUOXETINE HCL 20 MG PO CAPS
60.0000 mg | ORAL_CAPSULE | Freq: Every day | ORAL | Status: DC
  Administered 2015-09-25: 60 mg via ORAL
  Filled 2015-09-24 (×4): qty 3

## 2015-09-24 MED ORDER — FLUPHENAZINE HCL 5 MG PO TABS
15.0000 mg | ORAL_TABLET | Freq: Every day | ORAL | Status: DC
  Filled 2015-09-24 (×2): qty 3

## 2015-09-24 MED ORDER — TRAZODONE HCL 150 MG PO TABS
150.0000 mg | ORAL_TABLET | Freq: Every day | ORAL | Status: DC
  Administered 2015-09-24: 150 mg via ORAL
  Filled 2015-09-24 (×2): qty 1

## 2015-09-24 NOTE — B[facility] Group Notes (Signed)
[facility] LCSW Aftercare Discharge Planning Group Note   09/24/2015 9:24 AM  Participation Quality:  Engaged  Mood/Affect:  Flat  Depression Rating:  8  Anxiety Rating:  8  Thoughts of Suicide:  No Will you contract for safety?   NA  Current AVH:  Yes  Plan for Discharge/Comments:  "I'm not doing well.  I am hearing voices.  It's bad."    Transportation Means:   Supports:  Daryel Gerald B

## 2015-09-24 NOTE — Progress Notes (Signed)
Adult Psychoeducational Group Note  Date:  09/24/2015 Time:  9:09 PM  Group Topic/Focus:  Wrap-Up Group:   The focus of this group is to help patients review their daily goal of treatment and discuss progress on daily workbooks.  Participation Level:  Did Not Attend  Participation Quality:  Did not attend  Affect:  Did not attend  Cognitive:  Did not attend  Insight: None  Engagement in Group:  Did not attend  Modes of Intervention:  Did not attend  Additional Comments:  Patient did not attend wrap up group tonight.   Threasa Kinch L Kyleena Scheirer 09/24/2015, 9:09 PM

## 2015-09-24 NOTE — Progress Notes (Signed)
   D: When asked about his day pt stated, "so,so". Stated, "I told her the medicine I'm on ain't doing any good. She said she was gonna up the dose". Pt informed the writer that he was still hearing voices and wouldn't be attending group.   A:  Support and encouragement was offered. 15 min checks continued for safety.  R: Pt remains safe.

## 2015-09-24 NOTE — Progress Notes (Signed)
[facility] MD Progress Note  09/24/2015 2:30 PM Bruce [name]  MRN:  [id number] Subjective: Patient states " I am depressed, I still hear voices and I cannot sleep.'  Objective: Bruce [name] is a 40 year old African-American male. Admitted to [facility] from the [facility] observation unit with complaints of auditory hallucinations telling him to kill himself & others.  Patient seen and chart reviewed.Discussed patient with treatment team.  Pt today continues to be seen as withdrawn, depressed, continues to have command AH asking him to kill self , pt also with sleep issues as well as paranoia. Pt also reports SI , denies plan today . Per staff - pt continues to be depressed, psychotic, will continue encouragement and support.     Principal Problem: Schizophrenia (HCC) Diagnosis:   Patient Active Problem List   Diagnosis Date Noted  . 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 Alcohol use disorder, moderate, dependence (HCC) [F10.20] 09/22/2015  . Morbid obesity (HCC) [E66.01] 09/22/2015  . Schizophrenia (HCC) [F20.9] 09/21/2015   Total Time spent with patient: 30 minutes  Past Psychiatric History: please see H&P  Past Medical History:  Past Medical History  Diagnosis Date  . Depression   . Schizophrenia (HCC)   . Obesity    Family History: Denies hx of HTN,dm,cardiac disease, thyroid disease in family.  Family History  Problem Relation Age of Onset  . Schizophrenia Mother    Family Psychiatric  History: Mother has schizophrenia. Pt denies substance abuse in family. Social History: Pt is single, is currently homeless,denies legal issues. History  Alcohol Use  . 16.8 oz/week  . 28 Cans of beer per week     History  Drug Use No    Social History   Social History  . Marital Status: Single    Spouse Name: N/A  . Number of Children: N/A  . Years of Education: N/A   Social History Main Topics  . Smoking status: Never Smoker   . Smokeless tobacco: None  . Alcohol Use: 16.8 oz/week    28  Cans of beer per week  . Drug Use: No  . Sexual Activity: Not Currently   Other Topics Concern  . None   Social History Narrative   Additional Social History:                         Sleep: Poor  Appetite:  Poor  Current Medications: Current Facility-Administered Medications  Medication Dose Route Frequency Provider Last Rate Last Dose  . acetaminophen (TYLENOL) tablet 650 mg  650 mg Oral Q6H PRN Charm Rings, NP   650 mg at 09/23/15 0805  . alum & mag hydroxide-simeth (MAALOX/MYLANTA) 200-200-20 MG/5ML suspension 30 mL  30 mL Oral Q4H PRN Charm Rings, NP      . ARIPiprazole (ABILIFY) tablet 5 mg  5 mg Oral QPM Jaramie Bastos, MD      . benztropine (COGENTIN) tablet 0.5 mg  0.5 mg Oral Daily Wynetta Seith, MD   0.5 mg at 09/24/15 0825  . benztropine (COGENTIN) tablet 0.5 mg  0.5 mg Oral QHS Jomarie Longs, MD   0.5 mg at 09/23/15 2114  . [START ON 09/25/2015] FLUoxetine (PROZAC) capsule 60 mg  60 mg Oral Daily Anaria Kroner, MD      . fluPHENAZine (PROLIXIN) tablet 10 mg  10 mg Oral QHS Cinthia Rodden, MD      . fluPHENAZine (PROLIXIN) tablet 5 mg  5 mg Oral Daily Jomarie Longs, MD  5 mg at 09/24/15 0825  . loperamide (IMODIUM) capsule 2 mg  2 mg Oral PRN Thermon Leyland, NP   2 mg at 09/24/15 0827  . magnesium hydroxide (MILK OF MAGNESIA) suspension 30 mL  30 mL Oral Daily PRN Charm Rings, NP      . multivitamin with minerals tablet 1 tablet  1 tablet Oral Daily Charm Rings, NP   1 tablet at 09/24/15 [phone]  . OLANZapine zydis (ZYPREXA) disintegrating tablet 5 mg  5 mg Oral Q6H PRN Jomarie Longs, MD       Or  . OLANZapine (ZYPREXA) injection 5 mg  5 mg Intramuscular Q6H PRN Jomarie Longs, MD      . traZODone (DESYREL) tablet 150 mg  150 mg Oral QHS Jomarie Longs, MD        Lab Results:  Results for orders placed or performed during the hospital encounter of 09/20/15 (from the past 48 hour(s))  Prolactin     Status: Abnormal   Collection Time: 09/23/15   6:50 AM  Result Value Ref Range   Prolactin 84.1 (H) 4.0 - 15.2 ng/mL    Comment: (NOTE) Performed At: [facility] [address] [place], [place] [id number] Mila Homer MD JX:[id number] Performed at [facility]     Physical Findings: AIMS: Facial and Oral Movements Muscles of Facial Expression: None, normal Lips and Perioral Area: None, normal Jaw: None, normal Tongue: None, normal,Extremity Movements Upper (arms, wrists, hands, fingers): None, normal Lower (legs, knees, ankles, toes): None, normal, Trunk Movements Neck, shoulders, hips: None, normal, Overall Severity Severity of abnormal movements (highest score from questions above): None, normal Incapacitation due to abnormal movements: None, normal Patient's awareness of abnormal movements (rate only patient's report): No Awareness, Dental Status Current problems with teeth and/or dentures?: No Does patient usually wear dentures?: No  CIWA:  CIWA-Ar Total: 1 COWS:  COWS Total Score: 2  Musculoskeletal: Strength & Muscle Tone: within normal limits Gait & Station: normal Patient leans: N/A  Psychiatric Specialty Exam: Review of Systems  Psychiatric/Behavioral: Positive for depression, suicidal ideas, hallucinations and substance abuse. The patient is nervous/anxious and has insomnia.   All other systems reviewed and are negative.   Blood pressure 132/81, pulse 88, temperature 98.5 F (36.9 C), temperature source Oral, resp. rate 18, height  (1.727 m), weight 174.181 kg (384 lb), SpO2 97 %.Body mass index is 58.4 kg/(m^2).  General Appearance: Casual  Eye Contact::  Fair  Speech:  Clear and Coherent  Volume:  Normal  Mood:  Anxious and Depressed  Affect:  Appropriate  Thought Process:  Coherent  Orientation:  Full (Time, Place, and Person)  Thought Content:  Hallucinations: Auditory Command:  kill self, Paranoid Ideation and Rumination  Suicidal Thoughts:  Yes.  without  intent/plan  Homicidal Thoughts:  No  Memory:  Immediate;   Fair Recent;   Fair Remote;   Fair  Judgement:  Impaired  Insight:  Shallow  Psychomotor Activity:  Decreased  Concentration:  Poor  Recall:  [employer] of Knowledge:Fair  Language: Fair  Akathisia:  No  Handed:  Right  AIMS (if indicated):     Assets:  Others:  access to healthcare  ADL's:  Intact  Cognition: WNL  Sleep:  Number of Hours: 6.5   Treatment Plan Summary:Bruce [name] is a 40 year old African-American male,who has a hx of alcohol abuse, schizophrenia, who presented to [facility] with worsening psychosis and SI. Pt today continues to be depressed ,  suicidal and has command AH. Will readjust medications and continue treatment.  Daily contact with patient to assess and evaluate symptoms and progress in treatment and Medication management  Reviewed past medical records,treatment plan.  Will increase Prozac to 60 mg po daily for affective sx. Will continue Prolixin  5 mg po daily and 10 mg po qhs for psychosis. Will add Abilify po qpm for psychosis- to augment the effect of prolixin as well as to help with elevated PL. Will continue Cogentin 0.5 mg po bid for EPS. Will increase Trazodone to 150 mg po qhs for sleep. Will make available PRN medications as per agitation protocol. Will continue to monitor vitals ,medication compliance and treatment side effects while patient is here.  Will monitor for medical issues as well as call consult as needed.  Reviewed labs ,PL elevated - add Abilify . Recreational therapist consult.  CSW will start working on disposition.  Patient to participate in therapeutic milieu .      Chasta Deshpande MD 09/24/2015, 2:30 PM

## 2015-09-24 NOTE — [facility] Group Notes (Signed)
[facility] LCSW Group Therapy  09/24/2015 2:04 PM  Type of Therapy: Group Therapy  Participation Level:Invited. Chose not to attend. In bed asleep.  Summary of Progress/Problems: [name] from [employer] was here to tell his story of recovery and play his guitar.  [name]. [name] 09/24/2015 2:04 PM

## 2015-09-24 NOTE — Progress Notes (Signed)
DAR NOTE: Patient presents with anxious affect and depressed mood.  Denies pain, auditory and visual hallucinations.  Rates depression at 8, hopelessness at 8, and anxiety at 6.  Describes energy level as low and concentration as poor.  Maintained on routine safety checks.  Medications given as prescribed.  Support and encouragement offered as needed.  Attended group and participated.    Patient observed socializing with peers in the dayroom.  Tylenol 650 mg given for complain of headache with good effect.

## 2015-09-25 MED ORDER — FLUOXETINE HCL 20 MG PO CAPS
80.0000 mg | ORAL_CAPSULE | Freq: Every day | ORAL | Status: DC
  Administered 2015-09-26 – 2015-09-29 (×4): 80 mg via ORAL
  Filled 2015-09-25 (×5): qty 4

## 2015-09-25 MED ORDER — TRAZODONE HCL 100 MG PO TABS
200.0000 mg | ORAL_TABLET | Freq: Every day | ORAL | Status: DC
  Administered 2015-09-25: 200 mg via ORAL
  Filled 2015-09-25 (×3): qty 2

## 2015-09-25 MED ORDER — FLUPHENAZINE HCL 5 MG PO TABS: 5.0000 mg | ORAL_TABLET | Freq: Every day | ORAL | Status: DC

## 2015-09-25 MED ORDER — BENZTROPINE MESYLATE 0.5 MG PO TABS
0.5000 mg | ORAL_TABLET | ORAL | Status: DC
  Administered 2015-09-26 – 2015-09-29 (×4): 0.5 mg via ORAL
  Filled 2015-09-25 (×7): qty 1

## 2015-09-25 MED ORDER — ARIPIPRAZOLE 5 MG PO TABS
5.0000 mg | ORAL_TABLET | ORAL | Status: DC
  Administered 2015-09-25 – 2015-09-26 (×2): 5 mg via ORAL
  Filled 2015-09-25 (×5): qty 1

## 2015-09-25 MED ORDER — FLUPHENAZINE HCL 10 MG PO TABS
10.0000 mg | ORAL_TABLET | Freq: Every day | ORAL | Status: DC
  Administered 2015-09-25: 10 mg via ORAL
  Filled 2015-09-25 (×3): qty 1

## 2015-09-25 NOTE — Progress Notes (Signed)
DAR NOTE: Patient presents with anxious affect and depressed mood.  Denies pain, auditory and visual hallucinations.  Rates depression at 8, hopelessness at 8, and anxiety at 6.  Maintained on routine safety checks.  Medications given as prescribed.  Support and encouragement offered as needed.  Attended group and participated.  States goal for today is "to think more."  Patient keeps to himself.  No interaction with staff or peers.  Imodium 2 mg given for complain of diarrhea with good effect.

## 2015-09-25 NOTE — [facility] Group Notes (Signed)
[facility] LCSW Group Therapy  09/25/2015 1:15 pm  Type of Therapy: Process Group Therapy  Participation Level:  Active  Participation Quality:  Appropriate  Affect:  Flat  Cognitive:  Oriented  Insight:  Improving  Engagement in Group:  Limited  Engagement in Therapy:  Limited  Modes of Intervention:  Activity, Clarification, Education, Problem-solving and Support  Summary of Progress/Problems: Today's group addressed the issue of overcoming obstacles.  Patients were asked to identify their biggest obstacle post d/c that stands in the way of their on-going success, and then problem solve as to how to manage this.Quiet. Spoke only when questioned directly.  Shared that a roommate had kicked him out when he found out that Brandon had a mental health diagnosis.  Zhyon denied that it had anything to do with behavior.  "I think he didn't understand, and was afraid of me."  [name] 09/25/2015   1:55 PM

## 2015-09-25 NOTE — [facility] Suicide Risk Assessment (Signed)
[facility] INPATIENT:  Family/Significant Other Suicide Prevention Education  Suicide Prevention Education:  Patient Refusal for Family/Significant Other Suicide Prevention Education: The patient Bruce [name] has refused to provide written consent for family/significant other to be provided Family/Significant Other Suicide Prevention Education during admission and/or prior to discharge.  Physician notified.  Daryel Gerald B 09/25/2015, 4:59 PM

## 2015-09-25 NOTE — Progress Notes (Signed)
[facility] MD Progress Note  09/25/2015 1:22 PM Bruce [name]  MRN:  [id number] Subjective: Patient states " I am still depressed, I am still hearing voices "  Objective: Bruce [name] is a 40 year old African-American male. Admitted to [facility] from the [facility] observation unit with complaints of auditory hallucinations telling him to kill himself & others.  Patient seen and chart reviewed.Discussed patient with treatment team.  Pt today continues to be seen as withdrawn, depressed, continues to have command AH asking him to kill self . Pt reports he continues to be paranoid , did not go for breakfast this AM since he was hearing voices and they were getting louder. Per staff - pt continues to be depressed, psychotic, will continue encouragement and support.     Principal Problem: Schizophrenia (HCC) Diagnosis:   Patient Active Problem List   Diagnosis Date Noted  . 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 Alcohol use disorder, moderate, dependence (HCC) [F10.20] 09/22/2015  . Morbid obesity (HCC) [E66.01] 09/22/2015  . Schizophrenia (HCC) [F20.9] 09/21/2015   Total Time spent with patient: 30 minutes  Past Psychiatric History: please see H&P  Past Medical History:  Past Medical History  Diagnosis Date  . Depression   . Schizophrenia (HCC)   . Obesity    Family History: Denies hx of HTN,dm,cardiac disease, thyroid disease in family.  Family History  Problem Relation Age of Onset  . Schizophrenia Mother    Family Psychiatric  History: Mother has schizophrenia. Pt denies substance abuse in family. Social History: Pt is single, is currently homeless,denies legal issues. History  Alcohol Use  . 16.8 oz/week  . 28 Cans of beer per week     History  Drug Use No    Social History   Social History  . Marital Status: Single    Spouse Name: N/A  . Number of Children: N/A  . Years of Education: N/A   Social History Main Topics  . Smoking status: Never Smoker   . Smokeless tobacco:  None  . Alcohol Use: 16.8 oz/week    28 Cans of beer per week  . Drug Use: No  . Sexual Activity: Not Currently   Other Topics Concern  . None   Social History Narrative   Additional Social History:                         Sleep: Poor  Appetite:  Poor  Current Medications: Current Facility-Administered Medications  Medication Dose Route Frequency Provider Last Rate Last Dose  . acetaminophen (TYLENOL) tablet 650 mg  650 mg Oral Q6H PRN Charm Rings, NP   650 mg at 09/23/15 0805  . alum & mag hydroxide-simeth (MAALOX/MYLANTA) 200-200-20 MG/5ML suspension 30 mL  30 mL Oral Q4H PRN Charm Rings, NP      . ARIPiprazole (ABILIFY) tablet 5 mg  5 mg Oral QPM Jomarie Longs, MD   5 mg at 09/24/15 1820  . ARIPiprazole (ABILIFY) tablet 5 mg  5 mg Oral BH-q7a Jomarie Longs, MD   5 mg at 09/25/15 1157  . benztropine (COGENTIN) tablet 0.5 mg  0.5 mg Oral QHS Jomarie Longs, MD   0.5 mg at 09/24/15 2141  . [START ON 09/26/2015] benztropine (COGENTIN) tablet 0.5 mg  0.5 mg Oral BH-q7a Sommer Spickard, MD      . FLUoxetine (PROZAC) capsule 60 mg  60 mg Oral Daily Jomarie Longs, MD   60 mg at 09/25/15 0836  . fluPHENAZine (PROLIXIN) tablet  10 mg  10 mg Oral QHS Demarus Latterell, MD      . loperamide (IMODIUM) capsule 2 mg  2 mg Oral PRN Thermon Leyland, NP   2 mg at 09/25/15 0844  . magnesium hydroxide (MILK OF MAGNESIA) suspension 30 mL  30 mL Oral Daily PRN Charm Rings, NP      . multivitamin with minerals tablet 1 tablet  1 tablet Oral Daily Charm Rings, NP   1 tablet at 09/25/15 [phone]  . OLANZapine zydis (ZYPREXA) disintegrating tablet 5 mg  5 mg Oral Q6H PRN Jomarie Longs, MD       Or  . OLANZapine (ZYPREXA) injection 5 mg  5 mg Intramuscular Q6H PRN Chinita Schimpf, MD      . traZODone (DESYREL) tablet 200 mg  200 mg Oral QHS Jomarie Longs, MD        Lab Results:  No results found for this or any previous visit (from the past 48 hour(s)).  Physical Findings: AIMS: Facial  and Oral Movements Muscles of Facial Expression: None, normal Lips and Perioral Area: None, normal Jaw: None, normal Tongue: None, normal,Extremity Movements Upper (arms, wrists, hands, fingers): None, normal Lower (legs, knees, ankles, toes): None, normal, Trunk Movements Neck, shoulders, hips: None, normal, Overall Severity Severity of abnormal movements (highest score from questions above): None, normal Incapacitation due to abnormal movements: None, normal Patient's awareness of abnormal movements (rate only patient's report): No Awareness, Dental Status Current problems with teeth and/or dentures?: No Does patient usually wear dentures?: No  CIWA:  CIWA-Ar Total: 1 COWS:  COWS Total Score: 2  Musculoskeletal: Strength & Muscle Tone: within normal limits Gait & Station: normal Patient leans: N/A  Psychiatric Specialty Exam: Review of Systems  Psychiatric/Behavioral: Positive for depression, suicidal ideas, hallucinations and substance abuse. The patient is nervous/anxious and has insomnia.   All other systems reviewed and are negative.   Blood pressure 121/75, pulse 81, temperature 98.6 F (37 C), temperature source Oral, resp. rate 20, height  (1.727 m), weight 174.181 kg (384 lb), SpO2 97 %.Body mass index is 58.4 kg/(m^2).  General Appearance: Casual  Eye Contact::  Fair  Speech:  Clear and Coherent  Volume:  Normal  Mood:  Anxious and Depressed  Affect:  Appropriate  Thought Process:  Coherent  Orientation:  Full (Time, Place, and Person)  Thought Content:  Hallucinations: Auditory Command:  kill self, Paranoid Ideation and Rumination  Suicidal Thoughts:  Yes.  without intent/plan  Homicidal Thoughts:  No  Memory:  Immediate;   Fair Recent;   Fair Remote;   Fair  Judgement:  Impaired  Insight:  Shallow  Psychomotor Activity:  Decreased  Concentration:  Poor  Recall:  [employer] of Knowledge:Fair  Language: Fair  Akathisia:  No  Handed:  Right  AIMS (if  indicated):     Assets:  Others:  access to healthcare  ADL's:  Intact  Cognition: WNL  Sleep:  Number of Hours: 6.75   Treatment Plan Summary:Bruce [name] is a 40 year old African-American male,who has a hx of alcohol abuse, schizophrenia, who presented to [facility] with worsening psychosis and SI. Pt today continues to be depressed , suicidal and has command AH. Will continue treatment.  Daily contact with patient to assess and evaluate symptoms and progress in treatment and Medication management  Reviewed past medical records,treatment plan.  Will increase Prozac to 80 mg po daily for affective sx. Will reduce Prolixin to 10 mg po qhs for psychosis.  Will increase Abilify to 5 mg po bid  for psychosis- to augment the effect of prolixin as well as to help with elevated PL. Will continue Cogentin 0.5 mg po bid for EPS. Will increase Trazodone to 200 mg po qhs for sleep. Will make available PRN medications as per agitation protocol. Will continue to monitor vitals ,medication compliance and treatment side effects while patient is here.  Will monitor for medical issues as well as call consult as needed.  Reviewed labs ,PL elevated - added Abilify . Recreational therapist consult.  CSW will start working on disposition.  Patient to participate in therapeutic milieu .      Veera Stapleton MD 09/25/2015, 1:22 PM

## 2015-09-25 NOTE — Progress Notes (Signed)
Recreation Therapy Notes  02.02.2017 approximately 2:50pm. Patient observed to be in bed, with covers pulled to his neck. LRT called patient name, patient responded making excuses for why he was laying in bed. LRT advised patient of roll and asked if patient was open to talking with her. Patient agreed. Patient shared he was admitted for AH to kill himself or others, no one in specific. Patient reports people are his stressors, stating that he tried to "do good" but people "treat me bad." Patient described this as a time where he got a job and then asked for time off because he was experiencing AH and then got fired. Patient reports limited leisure interest, including TV and walking. Patient reports coping skill of going to [employer] to get on the computers. Patient reports he is currently homeless, he was living with a friend, but he was kicked out. Patient expresses no interest in being active and chose only sedentary activities when given choices.   MD consulted and expressed patient is not attending groups or taking medications. MD requests LRT engage patient in coloring and coping skills exploration during admission.   Marykay Lex Charmayne Odell, LRT/CTRS   Jearl Klinefelter 09/25/2015 3:49 PM

## 2015-09-25 NOTE — [facility] Group Notes (Signed)
[facility] Group Notes:  (Nursing/MHT/Case Management/Adjunct)  Date:  09/25/2015  Time:  0930  Type of Therapy:  Nurse Education  Participation Level:  Active  Participation Quality:  Appropriate and Attentive  Affect:  Appropriate  Cognitive:  Alert and Appropriate  Insight:  Appropriate and Good  Engagement in Group:  Engaged  Modes of Intervention:  Activity, Discussion, Education and Exploration  Summary of Progress/Problems: Topic was on leisure and lifestyle changes. Discussed the importance of choosing a healthy leisure activities. Group encouraged to surround themselves with positive and healthy group/support system when changing to a healthy lifestyle. Patient was receptive and contributed. Patient states, "I need to stay focus."   Mickie Bail 09/25/2015, 2:41 PM

## 2015-09-25 NOTE — Tx Team (Signed)
Interdisciplinary Treatment Plan Update (Adult)  Date:  09/25/2015  Time Reviewed:  10:09 AM   Progress in Treatment: Attending groups: No.Pt new to unit. Continuing to assess.  Participating in groups:  No. Taking medication as prescribed:  Yes. Tolerating medication:  Yes. Family/Significant othe contact made:  SPE required for this pt prior to discharge.  Patient understands diagnosis:  Yes. and As evidenced by:  seeking treatment for SI, AH, depression, and medication stabilization. Discussing patient identified problems/goals with staff:  Yes. Medical problems stabilized or resolved:  Yes. Denies suicidal/homicidal ideation: Yes. Issues/concerns per patient self-inventory:  Other:  Discharge Plan or Barriers: CSW assessing for appropriate referrals. Pt reports that he has no current providers.   Reason for Continuation of Hospitalization: Depression Hallucinations Medication stabilization Suicidal ideation  Comments:  Bruce [name] is an 40 y.o. male presenting to [facility] [place] voluntarily for suicidal ideations with a plan to get hit by a train. Patient states that he was on the train tracks and started to think about how his suicide would impact his mother and he got off of the track. Patient states that he has auditory command hallucinations that trigger his SI. Patient states that the voices also tell him to kill others but there is not a specific person. Patient states that he has experienced hallucinations since his 45s and feels that the hallucinations have increased recently and he is not sure why. Patient states that he has been prescribed Haldol in the past but stopped taking it because he does not feel like it works. Patient states that he Abilify has worked in the past , however, he cannot afford it once he is released from a facility. Patient states that he last took medications about three days ago when he was in another facility, although he cannot remember the name of the  facility. Patient states that he has attempted suicide two tines with the last time being in October of 2016 where he overdosed on pills at his aunts house. He states that he was admitted to [facility] and realized that the pills were "herbal pills." Patient states that he has been to other facilities since that time, including [facility]. Patient denies outpatient treatment stating that he is unable to afford it. Patient endorses HI but denies identified victim, intent, or plan to hurt someone. Patient denies visual hallucinations. Patient denies use of drugs and alcohol. Patient UDS and BAL both clear at time of assessment.. Diagnosis: Schizophrenia   09/25/15:Continues with depression, SI with command hallucincations, as well as poor sleep.  Has not identified dispositional plan.  Will increase Prozac to 60 mg po daily for affective sx. Will continue Prolixin 5 mg po daily and 10 mg po qhs for psychosis. Will add Abilify po qpm for psychosis- to augment the effect of prolixin as well as to help with elevated PL. Will continue Cogentin 0.5 mg po bid for EPS. Will increase Trazodone to 150 mg po qhs for sleep.  Estimated length of stay:  4-5 days   New goal(s): to develop effective aftercare plan.   Additional Comments:  Patient and CSW reviewed pt's identified goals and treatment plan. Patient verbalized understanding and agreed to treatment plan. CSW reviewed [facility] "Discharge Process and Patient Involvement" Form. Pt verbalized understanding of information provided and signed form.    Review of initial/current patient goals per problem list:  1. Goal(s): Patient will participate in aftercare plan  Met: No.   Target date: at discharge  As evidenced by: Patient will  participate within aftercare plan AEB aftercare provider and housing plan at discharge being identified.  1/30: CSW assessing. During last admission, pt went to [facility]. 09/25/15:  Pt called [facility].  Was declined.   States his plan is to go to [facility] shelter, but understands he has to go through [facility], and appears to be despondent over having no safe place to stay.  Will continue to explore options with pt.  2. Goal (s): Patient will exhibit decreased depressive symptoms and suicidal ideations.  Met: No.    Target date: at discharge  As evidenced by: Patient will utilize self rating of depression at 3 or below and demonstrate decreased signs of depression or be deemed stable for discharge by MD.  1/30: Pt rates depression as high. Denies SI/HI.  09/25/15:  Rates depression at an 8.  Endorses SI, and contracts for safety  3. Goal(s): Patient will demonstrate decreased signs and symptoms of psychosis.   Met:No.   Target date: at discharge  As evidenced by: Patient will demonstrate decreased signs of paranoia, or be deemed stable for discharge by MD  1/30: Pt continues to report AH telling him to hurt himself and others. He is in the process of being moved to [address] hall due to level of psychosis.  09/25/15: Continues to c/o command hallucinations  Attendees: Patient:   09/25/2015 10:09 AM   Family:   09/25/2015 10:09 AM   Physician:  Ursula Alert  09/25/2015 10:09 AM   Nursing:   Hedy Jacob RN 09/25/2015 10:09 AM   Clinical Social Worker: Ripley Fraise LCSW 09/25/2015 10:09 AM   Clinical Social Worker: 09/25/2015 10:09 AM   Other:  Gerline Legacy Nurse Case Manager 09/25/2015 10:09 AM   Other:   09/25/2015 10:09 AM   Other:   09/25/2015 10:09 AM   Other:  09/25/2015 10:09 AM   Other:  09/25/2015 10:09 AM   Other:  09/25/2015 10:09 AM    09/25/2015 10:09 AM    09/25/2015 10:09 AM    09/25/2015 10:09 AM    09/25/2015 10:09 AM    Scribe for Treatment Team:   Maxie Better, LCSW 09/25/2015 10:09 AM

## 2015-09-25 NOTE — [facility] Group Notes (Signed)
Pt did not attend karaoke group.  Trenace Coughlin, MHT  

## 2015-09-26 LAB — LIPID PANEL
CHOLESTEROL: 157 mg/dL (ref 0–200)
HDL: 40 mg/dL — ABNORMAL LOW (ref >40)
LDL Cholesterol: 103 mg/dL — ABNORMAL HIGH (ref 0–99)
TRIGLYCERIDES: 72 mg/dL (ref <150)
Total CHOL/HDL Ratio: 3.9 RATIO
VLDL: 14 mg/dL (ref 0–40)

## 2015-09-26 LAB — TSH: TSH: 1.582 u[IU]/mL (ref 0.350–4.500)

## 2015-09-26 MED ORDER — ARIPIPRAZOLE 10 MG PO TABS
10.0000 mg | ORAL_TABLET | Freq: Every evening | ORAL | Status: DC
  Administered 2015-09-26 – 2015-09-27 (×2): 10 mg via ORAL
  Filled 2015-09-26 (×4): qty 1

## 2015-09-26 MED ORDER — ARIPIPRAZOLE 10 MG PO TABS
10.0000 mg | ORAL_TABLET | ORAL | Status: DC
  Administered 2015-09-27 – 2015-09-28 (×2): 10 mg via ORAL
  Filled 2015-09-26 (×4): qty 1

## 2015-09-26 MED ORDER — FLUPHENAZINE HCL 5 MG PO TABS
5.0000 mg | ORAL_TABLET | Freq: Every day | ORAL | Status: DC
  Administered 2015-09-26 – 2015-09-27 (×2): 5 mg via ORAL
  Filled 2015-09-26 (×4): qty 1

## 2015-09-26 MED ORDER — BENZTROPINE MESYLATE 0.5 MG PO TABS
0.5000 mg | ORAL_TABLET | Freq: Every evening | ORAL | Status: DC
  Administered 2015-09-26 – 2015-09-28 (×3): 0.5 mg via ORAL
  Filled 2015-09-26 (×5): qty 1

## 2015-09-26 MED ORDER — DOXEPIN HCL 10 MG PO CAPS
10.0000 mg | ORAL_CAPSULE | Freq: Every day | ORAL | Status: DC
  Administered 2015-09-26 – 2015-09-28 (×3): 10 mg via ORAL
  Filled 2015-09-26 (×4): qty 1

## 2015-09-26 NOTE — Progress Notes (Signed)
[facility] MD Progress Note  09/26/2015 1:14 PM Bruce [name]  MRN:  [id number] Subjective: Patient states " I am still depressed. I still hear voices , they are so loud that I could not go to breakfast again this AM.'    Objective: Bruce [name] is a 40 year old African-American male. Admitted to [facility] from the [facility] observation unit with complaints of auditory hallucinations telling him to kill himself & others.  Patient seen and chart reviewed.Discussed patient with treatment team.  Pt today continues to be seen as withdrawn, depressed, continues to have AH - does not elaborate what they say. Pt denies SI today , but reports he has HI to harm people. Pt reports Ability has worked for him in the past - will readjust medications.   Per staff - pt continues to be depressed, psychotic, will continue encouragement and support.     Principal Problem: Schizophrenia (HCC) Diagnosis:   Patient Active Problem List   Diagnosis Date Noted  . 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 Alcohol use disorder, moderate, dependence (HCC) [F10.20] 09/22/2015  . Morbid obesity (HCC) [E66.01] 09/22/2015  . Schizophrenia (HCC) [F20.9] 09/21/2015   Total Time spent with patient: 30 minutes  Past Psychiatric History: please see H&P  Past Medical History:  Past Medical History  Diagnosis Date  . Depression   . Schizophrenia (HCC)   . Obesity    Family History: Denies hx of HTN,dm,cardiac disease, thyroid disease in family.  Family History  Problem Relation Age of Onset  . Schizophrenia Mother    Family Psychiatric  History: Mother has schizophrenia. Pt denies substance abuse in family. Social History: Pt is single, is currently homeless,denies legal issues. History  Alcohol Use  . 16.8 oz/week  . 28 Cans of beer per week     History  Drug Use No    Social History   Social History  . Marital Status: Single    Spouse Name: N/A  . Number of Children: N/A  . Years of Education: N/A   Social  History Main Topics  . Smoking status: Never Smoker   . Smokeless tobacco: None  . Alcohol Use: 16.8 oz/week    28 Cans of beer per week  . Drug Use: No  . Sexual Activity: Not Currently   Other Topics Concern  . None   Social History Narrative   Additional Social History:                         Sleep: Poor  Appetite:  Poor  Current Medications: Current Facility-Administered Medications  Medication Dose Route Frequency Provider Last Rate Last Dose  . acetaminophen (TYLENOL) tablet 650 mg  650 mg Oral Q6H PRN Charm Rings, NP   650 mg at 09/23/15 0805  . alum & mag hydroxide-simeth (MAALOX/MYLANTA) 200-200-20 MG/5ML suspension 30 mL  30 mL Oral Q4H PRN Charm Rings, NP      . Melene Muller ON 09/27/2015] ARIPiprazole (ABILIFY) tablet 10 mg  10 mg Oral BH-q7a Kaspar Albornoz, MD      . ARIPiprazole (ABILIFY) tablet 10 mg  10 mg Oral QPM Temika Sutphin, MD      . benztropine (COGENTIN) tablet 0.5 mg  0.5 mg Oral Lucianne Lei, MD   0.5 mg at 09/26/15 0640  . benztropine (COGENTIN) tablet 0.5 mg  0.5 mg Oral QPM Maxi Carreras, MD      . doxepin (SINEQUAN) capsule 10 mg  10 mg Oral QHS Lazarus Sudbury,  MD      . FLUoxetine (PROZAC) capsule 80 mg  80 mg Oral Daily Jomarie Longs, MD   80 mg at 09/26/15 0839  . fluPHENAZine (PROLIXIN) tablet 5 mg  5 mg Oral QHS Brit Wernette, MD      . loperamide (IMODIUM) capsule 2 mg  2 mg Oral PRN Thermon Leyland, NP   2 mg at 09/25/15 0844  . magnesium hydroxide (MILK OF MAGNESIA) suspension 30 mL  30 mL Oral Daily PRN Charm Rings, NP      . multivitamin with minerals tablet 1 tablet  1 tablet Oral Daily Charm Rings, NP   1 tablet at 09/26/15 [id number]  . OLANZapine zydis (ZYPREXA) disintegrating tablet 5 mg  5 mg Oral Q6H PRN Jomarie Longs, MD   5 mg at 09/25/15 1937   Or  . OLANZapine (ZYPREXA) injection 5 mg  5 mg Intramuscular Q6H PRN Jomarie Longs, MD        Lab Results:  Results for orders placed or performed during the  hospital encounter of 09/20/15 (from the past 48 hour(s))  Lipid panel     Status: Abnormal   Collection Time: 09/26/15  6:35 AM  Result Value Ref Range   Cholesterol 157 0 - 200 mg/dL   Triglycerides 72 <119 mg/dL   HDL 40 (L) >14 mg/dL   Total CHOL/HDL Ratio 3.9 RATIO   VLDL 14 0 - 40 mg/dL   LDL Cholesterol 782 (H) 0 - 99 mg/dL    Comment:        Total Cholesterol/HDL:CHD Risk Coronary Heart Disease Risk Table                     Men   Women  1/2 Average Risk   3.4   3.3  Average Risk       5.0   4.4  2 X Average Risk   9.6   7.1  3 X Average Risk  23.4   11.0        Use the calculated Patient Ratio above and the CHD Risk Table to determine the patient's CHD Risk.        ATP III CLASSIFICATION (LDL):  <100     mg/dL   Optimal  956-213  mg/dL   Near or Above                    Optimal  130-159  mg/dL   Borderline  086-578  mg/dL   High  >469     mg/dL   Very High Performed at [facility]   TSH     Status: None   Collection Time: 09/26/15  6:35 AM  Result Value Ref Range   TSH 1.582 0.350 - 4.500 uIU/mL    Comment: Performed at [facility]    Physical Findings: AIMS: Facial and Oral Movements Muscles of Facial Expression: None, normal Lips and Perioral Area: None, normal Jaw: None, normal Tongue: None, normal,Extremity Movements Upper (arms, wrists, hands, fingers): None, normal Lower (legs, knees, ankles, toes): None, normal, Trunk Movements Neck, shoulders, hips: None, normal, Overall Severity Severity of abnormal movements (highest score from questions above): None, normal Incapacitation due to abnormal movements: None, normal Patient's awareness of abnormal movements (rate only patient's report): No Awareness, Dental Status Current problems with teeth and/or dentures?: No Does patient usually wear dentures?: No  CIWA:  CIWA-Ar Total: 1 COWS:  COWS Total Score: 2  Musculoskeletal: Strength &  Muscle Tone: within normal  limits Gait & Station: normal Patient leans: N/A  Psychiatric Specialty Exam: Review of Systems  Psychiatric/Behavioral: Positive for depression, hallucinations and substance abuse. The patient is nervous/anxious and has insomnia.   All other systems reviewed and are negative.   Blood pressure 126/79, pulse 57, temperature 98.4 F (36.9 C), temperature source Oral, resp. rate 20, height  (1.727 m), weight 174.181 kg (384 lb), SpO2 97 %.Body mass index is 58.4 kg/(m^2).  General Appearance: Casual  Eye Contact::  Fair  Speech:  Clear and Coherent  Volume:  Normal  Mood:  Anxious and Depressed  Affect:  Appropriate  Thought Process:  Coherent  Orientation:  Full (Time, Place, and Person)  Thought Content:  Hallucinations: Auditory Command:  kill self, Paranoid Ideation and Rumination  Suicidal Thoughts:  No  Homicidal Thoughts:  Yes.  without intent/plan  Memory:  Immediate;   Fair Recent;   Fair Remote;   Fair  Judgement:  Impaired  Insight:  Shallow  Psychomotor Activity:  Decreased  Concentration:  Poor  Recall:  [employer] of Knowledge:Fair  Language: Fair  Akathisia:  No  Handed:  Right  AIMS (if indicated):     Assets:  Others:  access to healthcare  ADL's:  Intact  Cognition: WNL  Sleep:  Number of Hours: 6   Treatment Plan Summary:Bruce [name] is a 40 year old African-American male,who has a hx of alcohol abuse, schizophrenia, who presented to [facility] with worsening psychosis and SI. Pt today continues to be depressed , homicidal and has command AH. Will continue treatment.  Daily contact with patient to assess and evaluate symptoms and progress in treatment and Medication management  Reviewed past medical records,treatment plan.  Increased Prozac to 80 mg po daily for affective sx.Will consider changing to wellbutrin if he continues to be depressed. Will cross titrate Prolixin with Abilify for psychosis. Pt reports lack of efficacy to Prolixin .Prolixin being down  tapered. Will continue Cogentin 0.5 mg po bid for EPS. Will change Trazodone to Doxepin 10 mg po qhs for sleep. Will make available PRN medications as per agitation protocol. Will continue to monitor vitals ,medication compliance and treatment side effects while patient is here.  Will monitor for medical issues as well as call consult as needed.  Reviewed labs ,PL elevated - added Abilify . Recreational therapist consult.  CSW will start working on disposition.  Patient to participate in therapeutic milieu .      Bruce Gervasi MD 09/26/2015, 1:14 PM

## 2015-09-26 NOTE — [facility] Group Notes (Signed)
[facility] LCSW Group Therapy   09/26/2015 1:52 PM  Type of Therapy: Group Therapy  Participation Level:  Invited. Chose not to attend.  Summary of Progress/Problems: Chaplain was here to lead a group on themes of hope and/or courage.   Bruce [name] 09/26/2015 1:52 PM

## 2015-09-26 NOTE — [facility] Group Notes (Signed)
[facility] LCSW Aftercare Discharge Planning Group Note   09/26/2015 10:09 AM  Participation Quality: Active  Mood/Affect:  Appropriate  Depression Rating:    Anxiety Rating:    Thoughts of Suicide:  No Will you contract for safety?   NA  Current AVH:  Yes  Plan for Discharge/Comments: Pt states that he is feeling better but still does not feel quite ready to leave yet. He states that he has had some problems sleeping. We agreed to check in on Monday AM, and if he is feeling better wants me to call about shelter placement then. Pt plans to go to a shelter in [place].   Transportation Means: Public Transit   Supports:  Jonathon Jordan

## 2015-09-26 NOTE — Progress Notes (Signed)
DAR NOTE: Patient presents with anxious affect and depressed mood.  Reports  auditory hallucination with command voices telling him to kill himself.  Patient contracts for safety.  Describes energy level as low and concentration as poor.  Patient remained isolative to his room most of the shift.  Rates depression at 8, hopelessness at 9, and anxiety at 7.  Maintained on routine safety checks.  Medications given as prescribed.  Support and encouragement offered as needed.  Attended group and participated.

## 2015-09-26 NOTE — Progress Notes (Signed)
Recreation Therapy Notes  02.03.2017 LRT returned to work with patient, patient observed to be sleeping. LRT will return during patient admission. Marykay Lex Nicolis Boody, LRT/CTRS   Jearl Klinefelter 09/26/2015 3:39 PM

## 2015-09-26 NOTE — Progress Notes (Signed)
Bruce [name] is in his dark room lying in his bed awake upon my arrival to his room. He states his goal was to be positive today. However, he states he was not able to do so due to the voices in his head.  Still endorsing SI. The voices are telling him to harm himself. He is able to contract for safety. Denies visual hallucinations. He rates his anxiety at 7/10. Encouragement and support given. Medications administered as prescribed. Continue to monitor Q 15 minutes for patient safety and medication effectiveness.

## 2015-09-26 NOTE — Progress Notes (Signed)
Did not attend group 

## 2015-09-27 LAB — HEMOGLOBIN A1C
Hgb A1c MFr Bld: 5.4 % (ref 4.8–5.6)
MEAN PLASMA GLUCOSE: 108 mg/dL

## 2015-09-27 NOTE — Progress Notes (Addendum)
Spoke with patient 1:1. He remains flat, depressed both in affect and mood. Rates depression at an 8/10, hopelessness at a 9/10 and anxiety at a 7/10. Endorsing AVH telling him to hurt self and others. Reports his goal is to "take the right meds." He is isolative at times, minimal interaction with staff and peers. Medicated per orders, no prn's needed or requested. Emotional support offered. Self inventory reviewed. Denies pain, physical complaints. He verbally contracts for safety and assures this Clinical research associate he will seek help should those feelings escalate. Remains safe on level III obs. Bruce [name]

## 2015-09-27 NOTE — Plan of Care (Signed)
Problem: Diagnosis: Increased Risk For Suicide Attempt Goal: STG-Patient Will Comply With Medication Regime Outcome: Progressing Patient has been med compliant.   Problem: Alteration in thought process Goal: STG-Patient is able to sleep at least 6 hours per night Outcome: Progressing Patient sleeping at least 6 hours/night.

## 2015-09-27 NOTE — [facility] Group Notes (Signed)
[facility] Group Notes: (Clinical Social Work)   09/27/2015      Type of Therapy:  Group Therapy   Participation Level:  Did Not Attend despite MHT prompting   Raye Slyter Grossman-Orr, LCSW 09/27/2015, 12:50 PM     

## 2015-09-27 NOTE — Plan of Care (Signed)
Problem: Ineffective individual coping Goal: STG: Patient will remain free from self harm Outcome: Progressing Pt safe on the unit at this time     

## 2015-09-27 NOTE — Progress Notes (Signed)
[facility] MD Progress Note  09/27/2015 10:29 AM Bruce [name]  MRN:  [id number] Subjective: Patient states " I still hear voices.  I still feel sad and depressed.  However I am sleeping better. '    Objective: Bruce [name] is a 40 year old African-American male. Admitted to [facility] from the [facility] observation unit with complaints of auditory hallucinations telling him to kill himself & others.  Patient seen and chart reviewed.patient remains very depressed sad and continued to endorse suicidal thoughts and auditory hallucination.  Recently Prozac increased 80 mg and started him on Abilify.  He is also taking Prolixin which is tapering .  Patient is tolerating the medication and denies any side effects.  He is hoping increasing Prozac and changing his medication may help his depression and hallucination.  He remains very isolated, withdrawn and he has limited interaction with other patients.  He does not participate in groups.  He has thought blocking.  He admitted having bad thoughts about people and he continued to endorse anhedonia and paranoia.    Principal Problem: Schizophrenia (HCC) Diagnosis:   Patient Active Problem List   Diagnosis Date Noted  . 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 Alcohol use disorder, moderate, dependence (HCC) [F10.20] 09/22/2015  . Morbid obesity (HCC) [E66.01] 09/22/2015  . Schizophrenia (HCC) [F20.9] 09/21/2015   Total Time spent with patient: 30 minutes  Past Psychiatric History: please see H&P  Past Medical History:  Past Medical History  Diagnosis Date  . Depression   . Schizophrenia (HCC)   . Obesity    Family History: Denies hx of HTN,dm,cardiac disease, thyroid disease in family.  Family History  Problem Relation Age of Onset  . Schizophrenia Mother    Family Psychiatric  History: Mother has schizophrenia. Pt denies substance abuse in family. Social History: Pt is single, is currently homeless,denies legal issues. History  Alcohol Use  . 16.8  oz/week  . 28 Cans of beer per week     History  Drug Use No    Social History   Social History  . Marital Status: Single    Spouse Name: N/A  . Number of Children: N/A  . Years of Education: N/A   Social History Main Topics  . Smoking status: Never Smoker   . Smokeless tobacco: None  . Alcohol Use: 16.8 oz/week    28 Cans of beer per week  . Drug Use: No  . Sexual Activity: Not Currently   Other Topics Concern  . None   Social History Narrative   Additional Social History:  Sleep: Fair  Appetite:  Poor  Current Medications: Current Facility-Administered Medications  Medication Dose Route Frequency Provider Last Rate Last Dose  . acetaminophen (TYLENOL) tablet 650 mg  650 mg Oral Q6H PRN Charm Rings, NP   650 mg at 09/23/15 0805  . alum & mag hydroxide-simeth (MAALOX/MYLANTA) 200-200-20 MG/5ML suspension 30 mL  30 mL Oral Q4H PRN Charm Rings, NP      . ARIPiprazole (ABILIFY) tablet 10 mg  10 mg Oral Lucianne Lei, MD   10 mg at 09/27/15 0160  . ARIPiprazole (ABILIFY) tablet 10 mg  10 mg Oral QPM Jomarie Longs, MD   10 mg at 09/26/15 1815  . benztropine (COGENTIN) tablet 0.5 mg  0.5 mg Oral Lucianne Lei, MD   0.5 mg at 09/27/15 1093  . benztropine (COGENTIN) tablet 0.5 mg  0.5 mg Oral QPM Saramma Eappen, MD   0.5 mg at 09/26/15 1815  .  doxepin (SINEQUAN) capsule 10 mg  10 mg Oral QHS Jomarie Longs, MD   10 mg at 09/26/15 2123  . FLUoxetine (PROZAC) capsule 80 mg  80 mg Oral Daily Jomarie Longs, MD   80 mg at 09/27/15 0820  . fluPHENAZine (PROLIXIN) tablet 5 mg  5 mg Oral QHS Jomarie Longs, MD   5 mg at 09/26/15 2123  . loperamide (IMODIUM) capsule 2 mg  2 mg Oral PRN Thermon Leyland, NP   2 mg at 09/26/15 2123  . magnesium hydroxide (MILK OF MAGNESIA) suspension 30 mL  30 mL Oral Daily PRN Charm Rings, NP      . multivitamin with minerals tablet 1 tablet  1 tablet Oral Daily Charm Rings, NP   1 tablet at 09/27/15 0820  . OLANZapine zydis  (ZYPREXA) disintegrating tablet 5 mg  5 mg Oral Q6H PRN Jomarie Longs, MD   5 mg at 09/25/15 1937   Or  . OLANZapine (ZYPREXA) injection 5 mg  5 mg Intramuscular Q6H PRN Jomarie Longs, MD        Lab Results:  Results for orders placed or performed during the hospital encounter of 09/20/15 (from the past 48 hour(s))  Lipid panel     Status: Abnormal   Collection Time: 09/26/15  6:35 AM  Result Value Ref Range   Cholesterol 157 0 - 200 mg/dL   Triglycerides 72 <161 mg/dL   HDL 40 (L) >09 mg/dL   Total CHOL/HDL Ratio 3.9 RATIO   VLDL 14 0 - 40 mg/dL   LDL Cholesterol 604 (H) 0 - 99 mg/dL    Comment:        Total Cholesterol/HDL:CHD Risk Coronary Heart Disease Risk Table                     Men   Women  1/2 Average Risk   3.4   3.3  Average Risk       5.0   4.4  2 X Average Risk   9.6   7.1  3 X Average Risk  23.4   11.0        Use the calculated Patient Ratio above and the CHD Risk Table to determine the patient's CHD Risk.        ATP III CLASSIFICATION (LDL):  <100     mg/dL   Optimal  540-981  mg/dL   Near or Above                    Optimal  130-159  mg/dL   Borderline  191-478  mg/dL   High  >295     mg/dL   Very High Performed at [facility]   Hemoglobin A1c     Status: None   Collection Time: 09/26/15  6:35 AM  Result Value Ref Range   Hgb A1c MFr Bld 5.4 4.8 - 5.6 %    Comment: (NOTE)         Pre-diabetes: 5.7 - 6.4         Diabetes: >6.4         Glycemic control for adults with diabetes: <7.0    Mean Plasma Glucose 108 mg/dL    Comment: (NOTE) Performed At: [facility] [address] [place], [place] [id number] Mila Homer MD QI:[id number] Performed at [facility]   TSH     Status: None   Collection Time: 09/26/15  6:35 AM  Result Value Ref Range  TSH 1.582 0.350 - 4.500 uIU/mL    Comment: Performed at [facility]    Physical Findings: AIMS: Facial and Oral Movements Muscles of Facial  Expression: None, normal Lips and Perioral Area: None, normal Jaw: None, normal Tongue: None, normal,Extremity Movements Upper (arms, wrists, hands, fingers): None, normal Lower (legs, knees, ankles, toes): None, normal, Trunk Movements Neck, shoulders, hips: None, normal, Overall Severity Severity of abnormal movements (highest score from questions above): None, normal Incapacitation due to abnormal movements: None, normal Patient's awareness of abnormal movements (rate only patient's report): No Awareness, Dental Status Current problems with teeth and/or dentures?: No Does patient usually wear dentures?: No  CIWA:  CIWA-Ar Total: 1 COWS:  COWS Total Score: 2  Musculoskeletal: Strength & Muscle Tone: within normal limits Gait & Station: normal Patient leans: N/A  Psychiatric Specialty Exam: Review of Systems  Psychiatric/Behavioral: Positive for depression, hallucinations and substance abuse. The patient is nervous/anxious and has insomnia.   All other systems reviewed and are negative.   Blood pressure 129/81, pulse 60, temperature 98.3 F (36.8 C), temperature source Oral, resp. rate 18, height 5\' 8"  (1.727 m), weight 174.181 kg (384 lb), SpO2 97 %.Body mass index is 58.4 kg/(m^2).  General Appearance: Casual  Eye Contact::  Fair  Speech:  Clear and Coherent  Volume:  Normal  Mood:  Anxious and Depressed  Affect:  Appropriate  Thought Process:  Coherent  Orientation:  Full (Time, Place, and Person)  Thought Content:  Hallucinations: Auditory Command:  kill self, Paranoid Ideation and Rumination  Suicidal Thoughts:  Yes.  without intent/plan  Homicidal Thoughts:  Yes.  without intent/plan  Memory:  Immediate;   Fair Recent;   Fair Remote;   Fair  Judgement:  Impaired  Insight:  Shallow  Psychomotor Activity:  Decreased  Concentration:  Poor  Recall:  [employer] of Knowledge:Fair  Language: Fair  Akathisia:  No  Handed:  Right  AIMS (if indicated):     Assets:   Others:  access to healthcare  ADL's:  Intact  Cognition: WNL  Sleep:  Number of Hours: 6.75   Treatment Plan Summary:Gaege is a 40 year old African-American male,who has a hx of alcohol abuse, schizophrenia, who presented to [facility] with worsening psychosis and SI. Pt today continues to be depressed , homicidal and has command AH. Will continue treatment.  Daily contact with patient to assess and evaluate symptoms and progress in treatment and Medication management  Reviewed past medical records,treatment plan.   continue Prozac 80 mg po daily for affective sx.Will consider changing to wellbutrin if he continues to be depressed. Will cross titrate Prolixin with Abilify for psychosis. Pt reports lack of efficacy to Prolixin .Prolixin being down tapered and will consider discontinue in a.m. . Will continue Cogentin 0.5 mg po bid for EPS. Will  continue Doxepin 10 mg po qhs for sleep. Will make available PRN medications as per agitation protocol. Will continue to monitor vitals ,medication compliance and treatment side effects while patient is here.  Will monitor for medical issues as well as call consult as needed.  Reviewed labs ,PL elevated - added Abilify . Recreational therapist consult.  CSW will start working on disposition.  Patient to participate in therapeutic milieu .      Raylen Ken T. MD 09/27/2015, 10:29 AM

## 2015-09-27 NOTE — Progress Notes (Signed)
D: Pt +ve  SI/HI/AH. Pt is pleasant and cooperative. Pt stayed in room most of the evening. Pt stated he was feeling the same sine coming in. Pt forwards little information. Pt in no distress at this time.   A: Pt was offered support and encouragement. Pt was given scheduled medications. Pt was encourage to attend groups. Q 15 minute checks were done for safety.   R: Pt is taking medication. Pt has no complaints.Pt receptive to treatment and safety maintained on unit.

## 2015-09-28 MED ORDER — ZIPRASIDONE HCL 40 MG PO CAPS
40.0000 mg | ORAL_CAPSULE | Freq: Two times a day (BID) | ORAL | Status: DC
  Administered 2015-09-28 – 2015-09-29 (×2): 40 mg via ORAL
  Filled 2015-09-28 (×5): qty 1

## 2015-09-28 NOTE — [facility] Group Notes (Signed)
[facility] Group Notes:  (Clinical Social Work)  09/28/2015  [facility] Group Notes:  (Clinical Social Work)  09/28/2015  11:00AM-12:00PM  Summary of Progress/Problems:  The main focus of today's process group was to listen to a variety of genres of music and to identify that different types of music provoke different responses.  The patient then was able to identify personally what was soothing for them, as well as energizing.  The patient expressed understanding of concepts, as well as knowledge of how each type of music affected him and how this can be used at home as a wellness/recovery tool.  He spoke little, and only when called on directly.  However, at the end of group he raised his hand to indicate he felt better at the end of group than at the beginning.  Type of Therapy:  Music Therapy   Participation Level:  Active  Participation Quality:  Attentive  Affect:  Flat and Depressed  Cognitive:  Oriented  Insight:  Engaged  Engagement in Therapy:  Engaged  Modes of Intervention:   Activity, Exploration  [name], LCSW 09/28/2015

## 2015-09-28 NOTE — Progress Notes (Signed)
Psychoeducational Group Note  Date:  09/28/2015 Time:  0119  Group Topic/Focus:  Wrap-Up Group:   The focus of this group is to help patients review their daily goal of treatment and discuss progress on daily workbooks.  Participation Level: Did Not Attend  Participation Quality:  Not Applicable  Affect:  Not Applicable  Cognitive:  Not Applicable  Insight:  Not Applicable  Engagement in Group: Not Applicable  Additional Comments:  The patient did not attend group since he was asleep in his room.   Naija Troost S 09/28/2015, 1:19 AM

## 2015-09-28 NOTE — Progress Notes (Signed)
D: Bruce [name] has been isolative, remaining in the room for most of the day. Bruce [name] reports hearing voices to harm self and others but contracts for safety while on the unit. He appears dysphoric and flat. He reports fair sleep and appetite as well as poor concentration and low energy level. On his self-inventory sheet, he reports depression 8, hopelessness 8, and anxiety 7. He reports sometimes feeling lightheaded.  A: Meds given as ordered. Q15 safety checks maintained. Support/encouragement offered. R: Pt remains free from harm and continues with treatment. Will continue to monitor for needs/safety.

## 2015-09-28 NOTE — Progress Notes (Signed)
[facility] MD Progress Note  09/28/2015 2:02 PM Sho Salguero  MRN:  [id number] Subjective: Patient states "I don't think my medicine working.  I like to change to something different.  I still hear voices.  I still feel paranoia.  I continued to have suicidal thoughts. '    Objective: Stylianos is a 40 year old African-American male. Admitted to [facility] from the [facility] observation unit with complaints of auditory hallucinations telling him to kill himself & others.  Patient seen and chart reviewed.  Patient remains very depressed, isolated, withdrawn and continued to endorse suicidal thoughts and hallucination.  He does not feel current medicine working.  He is taking Abilify and higher dose of Prozac.  He has no tremors or shakes but he does not see any improvement.  He has thought blocking.  He does not participate in the groups however he goes and sits there.  He has no tremors or shakes.  He is not disruptive or aggressive.  However he is very withdrawn with flat affect.     Principal Problem: Schizophrenia (HCC) Diagnosis:   Patient Active Problem List   Diagnosis Date Noted  . 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 Alcohol use disorder, moderate, dependence (HCC) [F10.20] 09/22/2015  . Morbid obesity (HCC) [E66.01] 09/22/2015  . Schizophrenia (HCC) [F20.9] 09/21/2015   Total Time spent with patient: 30 minutes  Past Psychiatric History: please see H&P  Past Medical History:  Past Medical History  Diagnosis Date  . Depression   . Schizophrenia (HCC)   . Obesity    Family History: Denies hx of HTN,dm,cardiac disease, thyroid disease in family.  Family History  Problem Relation Age of Onset  . Schizophrenia Mother    Family Psychiatric  History: Mother has schizophrenia. Pt denies substance abuse in family. Social History: Pt is single, is currently homeless,denies legal issues. History  Alcohol Use  . 16.8 oz/week  . 28 Cans of beer per week     History  Drug Use No     Social History   Social History  . Marital Status: Single    Spouse Name: N/A  . Number of Children: N/A  . Years of Education: N/A   Social History Main Topics  . Smoking status: Never Smoker   . Smokeless tobacco: None  . Alcohol Use: 16.8 oz/week    28 Cans of beer per week  . Drug Use: No  . Sexual Activity: Not Currently   Other Topics Concern  . None   Social History Narrative   Additional Social History:  Sleep: Fair  Appetite:  Poor  Current Medications: Current Facility-Administered Medications  Medication Dose Route Frequency Provider Last Rate Last Dose  . acetaminophen (TYLENOL) tablet 650 mg  650 mg Oral Q6H PRN Charm Rings, NP   650 mg at 09/23/15 0805  . alum & mag hydroxide-simeth (MAALOX/MYLANTA) 200-200-20 MG/5ML suspension 30 mL  30 mL Oral Q4H PRN Charm Rings, NP      . benztropine (COGENTIN) tablet 0.5 mg  0.5 mg Oral Lucianne Lei, MD   0.5 mg at 09/28/15 1478  . benztropine (COGENTIN) tablet 0.5 mg  0.5 mg Oral QPM Saramma Eappen, MD   0.5 mg at 09/27/15 1715  . doxepin (SINEQUAN) capsule 10 mg  10 mg Oral QHS Jomarie Longs, MD   10 mg at 09/27/15 2111  . FLUoxetine (PROZAC) capsule 80 mg  80 mg Oral Daily Jomarie Longs, MD   80 mg at 09/28/15 0824  .  loperamide (IMODIUM) capsule 2 mg  2 mg Oral PRN Thermon Leyland, NP   2 mg at 09/26/15 2123  . magnesium hydroxide (MILK OF MAGNESIA) suspension 30 mL  30 mL Oral Daily PRN Charm Rings, NP      . multivitamin with minerals tablet 1 tablet  1 tablet Oral Daily Charm Rings, NP   1 tablet at 09/28/15 [phone]  . OLANZapine zydis (ZYPREXA) disintegrating tablet 5 mg  5 mg Oral Q6H PRN Jomarie Longs, MD   5 mg at 09/27/15 1342   Or  . OLANZapine (ZYPREXA) injection 5 mg  5 mg Intramuscular Q6H PRN Saramma Eappen, MD      . ziprasidone (GEODON) capsule 40 mg  40 mg Oral BID WC Cleotis Nipper, MD        Lab Results:  No results found for this or any previous visit (from the past 48  hour(s)).  Physical Findings: AIMS: Facial and Oral Movements Muscles of Facial Expression: None, normal Lips and Perioral Area: None, normal Jaw: None, normal Tongue: None, normal,Extremity Movements Upper (arms, wrists, hands, fingers): None, normal Lower (legs, knees, ankles, toes): None, normal, Trunk Movements Neck, shoulders, hips: None, normal, Overall Severity Severity of abnormal movements (highest score from questions above): None, normal Incapacitation due to abnormal movements: None, normal Patient's awareness of abnormal movements (rate only patient's report): No Awareness, Dental Status Current problems with teeth and/or dentures?: No Does patient usually wear dentures?: No  CIWA:  CIWA-Ar Total: 1 COWS:  COWS Total Score: 2  Musculoskeletal: Strength & Muscle Tone: within normal limits Gait & Station: normal Patient leans: N/A  Psychiatric Specialty Exam: Review of Systems  Psychiatric/Behavioral: Positive for depression, hallucinations and substance abuse. The patient is nervous/anxious and has insomnia.   All other systems reviewed and are negative.   Blood pressure 97/56, pulse 63, temperature 98.4 F (36.9 C), temperature source Oral, resp. rate 16, height 5\' 8"  (1.727 m), weight 174.181 kg (384 lb), SpO2 97 %.Body mass index is 58.4 kg/(m^2).  General Appearance: Casual  Eye Contact::  Fair  Speech:  Clear and Coherent  Volume:  Normal  Mood:  Anxious and Depressed  Affect:  Blunt, Constricted, Depressed and Flat  Thought Process:  Coherent  Orientation:  Full (Time, Place, and Person)  Thought Content:  Hallucinations: Auditory Command:  kill self, Paranoid Ideation and Rumination  Suicidal Thoughts:  Yes.  without intent/plan  Homicidal Thoughts:  Yes.  without intent/plan  Memory:  Immediate;   Fair Recent;   Fair Remote;   Fair  Judgement:  Impaired  Insight:  Shallow  Psychomotor Activity:  Decreased  Concentration:  Poor  Recall:  [employer] of Knowledge:Fair  Language: Fair  Akathisia:  No  Handed:  Right  AIMS (if indicated):     Assets:  Others:  access to healthcare  ADL's:  Intact  Cognition: WNL  Sleep:  Number of Hours: 6.75   Treatment Plan Summary:Lemmie is a 40 year old African-American male,who has a hx of alcohol abuse, schizophrenia, who presented to [facility] with worsening psychosis and SI. Pt today continues to be depressed , homicidal and has command AH. Will continue treatment.  Daily contact with patient to assess and evaluate symptoms and progress in treatment and Medication management  Reviewed past medical records,treatment plan.  Discontinue Abilify as it is not helping.  We will try Geodon 40 mg twice a day.  Discussed medication side effects and benefits.  Patient does  not want any medication that cause further weight gain.   Continue Prozac 80 mg po daily for affective sx.Will consider changing to wellbutrin if he continues to be depressed. Will continue Cogentin 0.5 mg po bid for EPS. Will  continue Doxepin 10 mg po qhs for sleep. Will make available PRN medications as per agitation protocol. Will continue to monitor vitals ,medication compliance and treatment side effects while patient is here.  Will monitor for medical issues as well as call consult as needed.  Reviewed labs ,PL elevated  Recreational therapist consult.  CSW will start working on disposition.  Patient to participate in therapeutic milieu .      Oleva Koo T. MD 09/28/2015, 2:02 PM

## 2015-09-28 NOTE — Progress Notes (Signed)
Writer entered patients room and observed him lying in bed resting but not asleep. He voiced no concerns. Writer informed him of scheduled medication and he requested it at 2100. Patient reports passive si and verbally contracts for safety, also hears voices to harm others. Support given and safety maintained on unit with 15 min checks.

## 2015-09-29 MED ORDER — OLANZAPINE 10 MG IM SOLR: 5.0000 mg | Freq: Four times a day (QID) | INTRAMUSCULAR | Status: DC | PRN

## 2015-09-29 MED ORDER — FLUOXETINE HCL 20 MG PO CAPS
20.0000 mg | ORAL_CAPSULE | Freq: Every day | ORAL | Status: DC
  Administered 2015-09-30: 20 mg via ORAL
  Filled 2015-09-29 (×2): qty 1

## 2015-09-29 MED ORDER — OLANZAPINE 5 MG PO TBDP
5.0000 mg | ORAL_TABLET | Freq: Four times a day (QID) | ORAL | Status: DC | PRN
  Administered 2015-09-30 – 2015-10-01 (×2): 5 mg via ORAL
  Filled 2015-09-29 (×3): qty 1

## 2015-09-29 MED ORDER — BENZTROPINE MESYLATE 0.5 MG PO TABS
0.5000 mg | ORAL_TABLET | Freq: Two times a day (BID) | ORAL | Status: DC
  Administered 2015-09-29 – 2015-10-09 (×20): 0.5 mg via ORAL
  Filled 2015-09-29 (×3): qty 1
  Filled 2015-09-29: qty 14
  Filled 2015-09-29 (×6): qty 1
  Filled 2015-09-29: qty 14
  Filled 2015-09-29 (×10): qty 1
  Filled 2015-09-29: qty 14
  Filled 2015-09-29 (×3): qty 1
  Filled 2015-09-29: qty 14

## 2015-09-29 MED ORDER — ZIPRASIDONE HCL 60 MG PO CAPS
60.0000 mg | ORAL_CAPSULE | Freq: Two times a day (BID) | ORAL | Status: DC
  Administered 2015-09-29 – 2015-09-30 (×2): 60 mg via ORAL
  Filled 2015-09-29 (×4): qty 1

## 2015-09-29 MED ORDER — DOXEPIN HCL 10 MG PO CAPS
20.0000 mg | ORAL_CAPSULE | Freq: Every day | ORAL | Status: DC
  Administered 2015-09-29 – 2015-10-04 (×6): 20 mg via ORAL
  Filled 2015-09-29 (×7): qty 2

## 2015-09-29 MED ORDER — VENLAFAXINE HCL ER 37.5 MG PO CP24
37.5000 mg | ORAL_CAPSULE | Freq: Every day | ORAL | Status: DC
  Administered 2015-09-30: 37.5 mg via ORAL
  Filled 2015-09-29 (×2): qty 1

## 2015-09-29 NOTE — [facility] Group Notes (Signed)
[facility] LCSW Aftercare Discharge Planning Group Note   09/29/2015 11:44 AM  Participation Quality:  Invited, in bed, did not attend  Sallee Lange

## 2015-09-29 NOTE — Progress Notes (Signed)
Did not attend group 

## 2015-09-29 NOTE — Progress Notes (Signed)
D: Pt +ve SI/HI/AVH Pt is pleasant and cooperative. Pt stayed in room most of the night, came out for snack and meds. Pt forwards little, but is appropriate on the unit.   A: Pt was offered support and encouragement. Pt was given scheduled medications. Pt was encourage to attend groups. Q 15 minute checks were done for safety.   R:. Pt is taking medication. Pt has no complaints.Pt receptive to treatment and safety maintained on unit.

## 2015-09-29 NOTE — Progress Notes (Signed)
Recreation Therapy Notes  02.06.2017. LRT approached patient in dayroom, patient appeared apprehensive to interact with LRT. LRT offered patient engagement in stress management techniques or introduction to community resources. Patient declined LRT at this time. LRT will continue to attempt to work with patient during admission.   Marykay Lex Alesandro Stueve, LRT/CTRS   Jearl Klinefelter 09/29/2015 3:47 PM

## 2015-09-29 NOTE — Progress Notes (Signed)
[facility] MD Progress Note  09/29/2015 2:29 PM Bruce [name]  MRN:  [id number] Subjective: Patient states "I feel depressed. My voices are getting real bad. I have voices asking me to kill people especially when I am in a group . That is why I do not go to groups.'     Objective: Bruce [name] is a 40 year old African-American male. Admitted to [facility] from the [facility] observation unit with complaints of auditory hallucinations telling him to kill himself & others.  Patient seen and chart reviewed.  Patient remains very depressed, isolated, withdrawn and continues to endorse suicidal thoughts and hallucination.   Pt reports he has SI , no plan as well as HI that comes and goes. Pt reports AH that tells him to kill his room mate. Pt per staff - continues to be psychotic , depressed. Pt will continue to need encouragement and support.    Principal Problem: Schizophrenia (HCC) Diagnosis:   Patient Active Problem List   Diagnosis Date Noted  . Paranoid schizophrenia (HCC) [F20.0]   . 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 Alcohol use disorder, moderate, dependence (HCC) [F10.20] 09/22/2015  . Morbid obesity (HCC) [E66.01] 09/22/2015  . Schizophrenia (HCC) [F20.9] 09/21/2015   Total Time spent with patient: 30 minutes  Past Psychiatric History: please see H&P  Past Medical History:  Past Medical History  Diagnosis Date  . Depression   . Schizophrenia (HCC)   . Obesity    Family History: Denies hx of HTN,dm,cardiac disease, thyroid disease in family.  Family History  Problem Relation Age of Onset  . Schizophrenia Mother    Family Psychiatric  History: Mother has schizophrenia. Pt denies substance abuse in family. Social History: Pt is single, is currently homeless,denies legal issues. History  Alcohol Use  . 16.8 oz/week  . 28 Cans of beer per week     History  Drug Use No    Social History   Social History  . Marital Status: Single    Spouse Name: N/A  . Number of Children: N/A   . Years of Education: N/A   Social History Main Topics  . Smoking status: Never Smoker   . Smokeless tobacco: None  . Alcohol Use: 16.8 oz/week    28 Cans of beer per week  . Drug Use: No  . Sexual Activity: Not Currently   Other Topics Concern  . None   Social History Narrative   Additional Social History:  Sleep: Fair  Appetite:  Poor  Current Medications: Current Facility-Administered Medications  Medication Dose Route Frequency Provider Last Rate Last Dose  . acetaminophen (TYLENOL) tablet 650 mg  650 mg Oral Q6H PRN Charm Rings, NP   650 mg at 09/23/15 0805  . alum & mag hydroxide-simeth (MAALOX/MYLANTA) 200-200-20 MG/5ML suspension 30 mL  30 mL Oral Q4H PRN Charm Rings, NP      . benztropine (COGENTIN) tablet 0.5 mg  0.5 mg Oral Lucianne Lei, MD   0.5 mg at 09/29/15 5621  . benztropine (COGENTIN) tablet 0.5 mg  0.5 mg Oral QPM Karene Bracken, MD   0.5 mg at 09/28/15 1716  . doxepin (SINEQUAN) capsule 20 mg  20 mg Oral QHS Jomarie Longs, MD      . Melene Muller ON 09/30/2015] FLUoxetine (PROZAC) capsule 20 mg  20 mg Oral Daily Iyania Denne, MD      . loperamide (IMODIUM) capsule 2 mg  2 mg Oral PRN Thermon Leyland, NP   2 mg at 09/29/15 1017  .  magnesium hydroxide (MILK OF MAGNESIA) suspension 30 mL  30 mL Oral Daily PRN Charm Rings, NP      . multivitamin with minerals tablet 1 tablet  1 tablet Oral Daily Charm Rings, NP   1 tablet at 09/29/15 0825  . OLANZapine zydis (ZYPREXA) disintegrating tablet 5 mg  5 mg Oral Q6H PRN Jomarie Longs, MD       Or  . OLANZapine (ZYPREXA) injection 5 mg  5 mg Intramuscular Q6H PRN Jomarie Longs, MD      . Melene Muller ON 09/30/2015] venlafaxine XR (EFFEXOR-XR) 24 hr capsule 37.5 mg  37.5 mg Oral Q breakfast Eylin Pontarelli, MD      . ziprasidone (GEODON) capsule 60 mg  60 mg Oral BID WC Tesla Keeler, MD        Lab Results:  No results found for this or any previous visit (from the past 48 hour(s)).  Physical Findings: AIMS:  Facial and Oral Movements Muscles of Facial Expression: None, normal Lips and Perioral Area: None, normal Jaw: None, normal Tongue: None, normal,Extremity Movements Upper (arms, wrists, hands, fingers): None, normal Lower (legs, knees, ankles, toes): None, normal, Trunk Movements Neck, shoulders, hips: None, normal, Overall Severity Severity of abnormal movements (highest score from questions above): None, normal Incapacitation due to abnormal movements: None, normal Patient's awareness of abnormal movements (rate only patient's report): No Awareness, Dental Status Current problems with teeth and/or dentures?: No Does patient usually wear dentures?: No  CIWA:  CIWA-Ar Total: 1 COWS:  COWS Total Score: 2  Musculoskeletal: Strength & Muscle Tone: within normal limits Gait & Station: normal Patient leans: N/A  Psychiatric Specialty Exam: Review of Systems  Psychiatric/Behavioral: Positive for depression, suicidal ideas, hallucinations and substance abuse. The patient is nervous/anxious.   All other systems reviewed and are negative.   Blood pressure 102/64, pulse 66, temperature 98.3 F (36.8 C), temperature source Oral, resp. rate 18, height 5\' 8"  (1.727 m), weight 174.181 kg (384 lb), SpO2 97 %.Body mass index is 58.4 kg/(m^2).  General Appearance: Casual  Eye Contact::  Fair  Speech:  Clear and Coherent  Volume:  Normal  Mood:  Anxious and Depressed  Affect:  Depressed  Thought Process:  Coherent  Orientation:  Full (Time, Place, and Person)  Thought Content:  Hallucinations: Auditory Command:  kill self and room mate as well as other people, Paranoid Ideation and Rumination  Suicidal Thoughts:  Yes.  without intent/plan  Homicidal Thoughts:  Yes.  without intent/plan  Memory:  Immediate;   Fair Recent;   Fair Remote;   Fair  Judgement:  Impaired  Insight:  Shallow  Psychomotor Activity:  Decreased  Concentration:  Poor  Recall:  [employer] of Knowledge:Fair   Language: Fair  Akathisia:  No  Handed:  Right  AIMS (if indicated):     Assets:  Others:  access to healthcare  ADL's:  Intact  Cognition: WNL  Sleep:  Number of Hours: 6.75   Treatment Plan Summary:Bruce [name] is a 40 year old African-American male,who has a hx of alcohol abuse, schizophrenia, who presented to [facility] with worsening psychosis and SI. Pt today continues to be depressed ,suicidal, homicidal and has command AH. Will continue treatment.  Daily contact with patient to assess and evaluate symptoms and progress in treatment and Medication management  Reviewed past medical records,treatment plan.   We increase Geodon to 60 mg PO twice a day.  Discussed medication side effects and benefits.  Patient does not want any medication  that cause further weight gain.   Taper off Prozac for lack of efficacy. Start Effexor XR 37.5 mg po daily for affective sx. Will continue Cogentin 0.5 mg po bid for EPS. Will  Increase Doxepin to 20 mg po qhs for sleep. Will make available PRN medications as per agitation protocol. Will continue to monitor vitals ,medication compliance and treatment side effects while patient is here.  Will monitor for medical issues as well as call consult as needed.  Reviewed labs ,PL elevated - change to geodon should help with the same. Recreational therapist consult.  CSW will start working on disposition.  Patient to participate in therapeutic milieu .      Tiziana Cislo MD 09/29/2015, 2:29 PM

## 2015-09-29 NOTE — Plan of Care (Signed)
Problem: Ineffective individual coping Goal: STG: Patient will remain free from self harm Outcome: Progressing Pt safe on the unit at this time     

## 2015-09-29 NOTE — Progress Notes (Signed)
D-  Patient has reported hearing voices that are telling him to harm himself and to kill his roommate.  Patient also reported visual hallucinations of shadows moving last night.  Patient has endorsed increased anxiety and states he doesn't know if his medications are working.  Patient stated that he would be able to come to staff if he had worsening symptoms and has been able to maintain his safety this shift.   A- Assess patient for safety, offer medications as prescribed, engage patient in 1:1 staff talks, encourage patient out of bed and to attend groups.    R-  Patient able to contract for safety.

## 2015-09-29 NOTE — [facility] Group Notes (Signed)
[facility] LCSW Group Therapy  09/29/2015 5:34 PM  Type of Therapy:  Group Therapy  Participation Level:  Minimal  Participation Quality:  Disengaged  Affect:  Depressed  Cognitive:  Lacking  Insight:  Limited  Engagement in Therapy:  Limited  Modes of Intervention:  Discussion, Exploration and Problem-solving  Summary of Progress/Problems: Today's Topic: Overcoming Obstacles. Patients identified one short term goal and potential obstacles in reaching this goal. Patients processed barriers involved in overcoming these obstacles. Patients identified steps necessary for overcoming these obstacles and explored motivation (internal and external) for facing these difficulties head on. Today's Topic: Overcoming Obstacles. Patients identified one short term goal and potential obstacles in reaching this goal. Patients processed barriers involved in overcoming these obstacles. Patients identified steps necessary for overcoming these obstacles and explored motivation (internal and external) for facing these difficulties head on. Pt identified lack of clear plan for housing and lack of shelter space as obstacle, identified "staying w a friend" as way to overcome this.     Sallee Lange 09/29/2015, 5:34 PM

## 2015-09-30 MED ORDER — VENLAFAXINE HCL ER 75 MG PO CP24
75.0000 mg | ORAL_CAPSULE | Freq: Every day | ORAL | Status: DC
  Administered 2015-10-01 – 2015-10-03 (×3): 75 mg via ORAL
  Filled 2015-09-30 (×5): qty 1

## 2015-09-30 MED ORDER — ZIPRASIDONE HCL 80 MG PO CAPS
80.0000 mg | ORAL_CAPSULE | Freq: Two times a day (BID) | ORAL | Status: DC
  Administered 2015-09-30 – 2015-10-01 (×2): 80 mg via ORAL
  Filled 2015-09-30 (×4): qty 1

## 2015-09-30 NOTE — Tx Team (Signed)
Interdisciplinary Treatment Plan Update (Adult)  Date:  09/30/2015  Time Reviewed:  2:58 PM   Progress in Treatment: Attending groups: No.Pt new to unit. Continuing to assess.  Participating in groups:  No. Taking medication as prescribed:  Yes. Tolerating medication:  Yes. Family/Significant othe contact made:  SPE required for this pt prior to discharge.  Patient understands diagnosis:  Yes. and As evidenced by:  seeking treatment for SI, AH, depression, and medication stabilization. Discussing patient identified problems/goals with staff:  Yes. Medical problems stabilized or resolved:  Yes. Denies suicidal/homicidal ideation: Yes. Issues/concerns per patient self-inventory:  Other:  Discharge Plan or Barriers: CSW assessing for appropriate referrals. Pt reports that he has no current providers.   Reason for Continuation of Hospitalization: Depression Hallucinations Medication stabilization Suicidal ideation  Comments:  Bruce [name] is an 40 y.o. male presenting to [place] voluntarily for suicidal ideations with a plan to get hit by a train. Patient states that he was on the train tracks and started to think about how his suicide would impact his mother and he got off of the track. Patient states that he has auditory command hallucinations that trigger his SI. Patient states that the voices also tell him to kill others but there is not a specific person. Patient states that he has experienced hallucinations since his 74s and feels that the hallucinations have increased recently and he is not sure why. Patient states that he has been prescribed Haldol in the past but stopped taking it because he does not feel like it works. Patient states that he Abilify has worked in the past , however, he cannot afford it once he is released from a facility. Patient states that he last took medications about three days ago when he was in another facility, although he cannot remember the name of the  facility. Patient states that he has attempted suicide two tines with the last time being in October of 2016 where he overdosed on pills at his aunts house. He states that he was admitted to [facility] and realized that the pills were "herbal pills." Patient states that he has been to other facilities since that time, including [facility]. Patient denies outpatient treatment stating that he is unable to afford it. Patient endorses HI but denies identified victim, intent, or plan to hurt someone. Patient denies visual hallucinations. Patient denies use of drugs and alcohol. Patient UDS and BAL both clear at time of assessment.. Diagnosis: Schizophrenia   09/25/15:Continues with depression, SI with command hallucincations, as well as poor sleep.  Has not identified dispositional plan.  Will increase Prozac to 60 mg po daily for affective sx. Will continue Prolixin 5 mg po daily and 10 mg po qhs for psychosis. Will add Abilify po qpm for psychosis- to augment the effect of prolixin as well as to help with elevated PL. Will continue Cogentin 0.5 mg po bid for EPS. Will increase Trazodone to 150 mg po qhs for sleep.  09/30/15: Pt today continues to be depressed ,suicidal, homicidal and has command [place]. Will continue treatment. Will increase Geodon to 80 mg PO twice a day. Discussed medication side effects and benefits. Patient does not want any medication that cause further weight gain.  Taper off Prozac for lack of efficacy. Increase Effexor XR to 75 mg po daily for affective sx. Will continue Cogentin 0.5 mg po bid for EPS. Will Continue Doxepin to 20 mg po qhs for sleep.  Estimated length of stay:  4-5 days   New  goal(s): to develop effective aftercare plan.   Additional Comments:  Patient and CSW reviewed pt's identified goals and treatment plan. Patient verbalized understanding and agreed to treatment plan. CSW reviewed [facility] "Discharge Process and Patient Involvement" Form. Pt verbalized  understanding of information provided and signed form.    Review of initial/current patient goals per problem list:  1. Goal(s): Patient will participate in aftercare plan  Met: No.   Target date: at discharge  As evidenced by: Patient will participate within aftercare plan AEB aftercare provider and housing plan at discharge being identified.  1/30: CSW assessing. During last admission, pt went to [name]. 09/25/15:  Pt called [facility].  Was declined.  States his plan is to go to [facility] shelter, but understands he has to go through [facility], and appears to be despondent over having no safe place to stay.  Will continue to explore options with pt. 09/30/15:  States he has been calling [facility] in [place], but no openings.  Willing to go to [facility] if they have an opening  2. Goal (s): Patient will exhibit decreased depressive symptoms and suicidal ideations.  Met: No.    Target date: at discharge  As evidenced by: Patient will utilize self rating of depression at 3 or below and demonstrate decreased signs of depression or be deemed stable for discharge by MD.  1/30: Pt rates depression as high. Denies SI/HI.  09/25/15:  Rates depression at an 8.  Endorses SI, and contracts for safety 09/30/15:  No change  3. Goal(s): Patient will demonstrate decreased signs and symptoms of psychosis.   Met:No.   Target date: at discharge  As evidenced by: Patient will demonstrate decreased signs of paranoia, or be deemed stable for discharge by MD  1/30: Pt continues to report AH telling him to hurt himself and others. He is in the process of being moved to [address] hall due to level of psychosis.  09/25/15: Continues to c/o command hallucinations 09/30/15:  No progress  Attendees: Patient:   09/30/2015 2:58 PM   Family:   09/30/2015 2:58 PM   Physician:  Ursula Alert  09/30/2015 2:58 PM   Nursing:   Hedy Jacob RN 09/30/2015 2:58 PM   Clinical Social Worker: Ripley Fraise LCSW  09/30/2015 2:58 PM   Clinical Social Worker: 09/30/2015 2:58 PM   Other:  Gerline Legacy Nurse Case Manager 09/30/2015 2:58 PM   Other:   09/30/2015 2:58 PM   Other:   09/30/2015 2:58 PM   Other:  09/30/2015 2:58 PM   Other:  09/30/2015 2:58 PM   Other:  09/30/2015 2:58 PM    09/30/2015 2:58 PM    09/30/2015 2:58 PM    09/30/2015 2:58 PM    09/30/2015 2:58 PM    Scribe for Treatment Team:   Maxie Better, LCSW 09/30/2015 2:58 PM

## 2015-09-30 NOTE — Progress Notes (Signed)
D-  Patient reports no decrease in psychotic symptoms.  Patient reports auditory hallucinations telling him to harm himself and others patient endorses feeling suicidal, but states he doesn't have andy desire to harm others.  Patient states that since his roommate has been out of his room he has been able to sleep better because the voices are not telling him to harm his roommate while he sleeps. Patient also endorse seeing shadows move around in his room.   A-  Assess patient for safety, offer medications as prescribed, engage patient in 1:1 therapeutic staff talks,   R-  Patient able to contract for safety continue to monitor as prescribed.

## 2015-09-30 NOTE — Progress Notes (Signed)
Did not attend group 

## 2015-09-30 NOTE — Progress Notes (Signed)
[facility] MD Progress Note  09/30/2015 10:50 AM Bruce [name]  MRN:  [id number] Subjective: Patient states "I still feel depressed, can you change my medications ? I still hear voices asking me to kill myself and others.'      Objective: Bruce [name] is a 40 year old African-American male. Admitted to [facility] from the [facility] observation unit with complaints of auditory hallucinations telling him to kill himself & others.  Patient seen and chart reviewed.  Patient remains very depressed,and endorses SI , pt denies plan. Pt reports AH is better this AM , but last night it was distressing - asked him"  to get up and kill" . Pt however is less withdrawn today , is seen as more on the unit , is taking care of his ADLs. Pt is seen as attending groups with encouragement. Pt per staff - continues to be psychotic , depressed. Pt will continue to need encouragement and support.    Principal Problem: Schizophrenia (HCC) Diagnosis:   Patient Active Problem List   Diagnosis Date Noted  . Paranoid schizophrenia (HCC) [F20.0]   . 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 Alcohol use disorder, moderate, dependence (HCC) [F10.20] 09/22/2015  . Morbid obesity (HCC) [E66.01] 09/22/2015  . Schizophrenia (HCC) [F20.9] 09/21/2015   Total Time spent with patient: 30 minutes  Past Psychiatric History: please see H&P  Past Medical History:  Past Medical History  Diagnosis Date  . Depression   . Schizophrenia (HCC)   . Obesity    Family History: Denies hx of HTN,dm,cardiac disease, thyroid disease in family.  Family History  Problem Relation Age of Onset  . Schizophrenia Mother    Family Psychiatric  History: Mother has schizophrenia. Pt denies substance abuse in family. Social History: Pt is single, is currently homeless,denies legal issues. History  Alcohol Use  . 16.8 oz/week  . 28 Cans of beer per week     History  Drug Use No    Social History   Social History  . Marital Status: Single     Spouse Name: N/A  . Number of Children: N/A  . Years of Education: N/A   Social History Main Topics  . Smoking status: Never Smoker   . Smokeless tobacco: None  . Alcohol Use: 16.8 oz/week    28 Cans of beer per week  . Drug Use: No  . Sexual Activity: Not Currently   Other Topics Concern  . None   Social History Narrative   Additional Social History:  Sleep: Fair  Appetite:  Poor improving  Current Medications: Current Facility-Administered Medications  Medication Dose Route Frequency Provider Last Rate Last Dose  . acetaminophen (TYLENOL) tablet 650 mg  650 mg Oral Q6H PRN Charm Rings, NP   650 mg at 09/23/15 0805  . alum & mag hydroxide-simeth (MAALOX/MYLANTA) 200-200-20 MG/5ML suspension 30 mL  30 mL Oral Q4H PRN Charm Rings, NP      . benztropine (COGENTIN) tablet 0.5 mg  0.5 mg Oral BID WC Jomarie Longs, MD   0.5 mg at 09/30/15 0741  . doxepin (SINEQUAN) capsule 20 mg  20 mg Oral QHS Jomarie Longs, MD   20 mg at 09/29/15 2103  . loperamide (IMODIUM) capsule 2 mg  2 mg Oral PRN Thermon Leyland, NP   2 mg at 09/30/15 0744  . magnesium hydroxide (MILK OF MAGNESIA) suspension 30 mL  30 mL Oral Daily PRN Charm Rings, NP      . multivitamin with minerals tablet 1  tablet  1 tablet Oral Daily Charm Rings, NP   1 tablet at 09/30/15 0741  . OLANZapine zydis (ZYPREXA) disintegrating tablet 5 mg  5 mg Oral Q6H PRN Jomarie Longs, MD   5 mg at 09/30/15 0745   Or  . OLANZapine (ZYPREXA) injection 5 mg  5 mg Intramuscular Q6H PRN Jomarie Longs, MD      . Melene Muller ON 10/01/2015] venlafaxine XR (EFFEXOR-XR) 24 hr capsule 75 mg  75 mg Oral Q breakfast Dreana Britz, MD      . ziprasidone (GEODON) capsule 80 mg  80 mg Oral BID WC Kalyn Dimattia, MD        Lab Results:  No results found for this or any previous visit (from the past 48 hour(s)).  Physical Findings: AIMS: Facial and Oral Movements Muscles of Facial Expression: None, normal Lips and Perioral Area: None,  normal Jaw: None, normal Tongue: None, normal,Extremity Movements Upper (arms, wrists, hands, fingers): None, normal Lower (legs, knees, ankles, toes): None, normal, Trunk Movements Neck, shoulders, hips: None, normal, Overall Severity Severity of abnormal movements (highest score from questions above): None, normal Incapacitation due to abnormal movements: None, normal Patient's awareness of abnormal movements (rate only patient's report): No Awareness, Dental Status Current problems with teeth and/or dentures?: No Does patient usually wear dentures?: No  CIWA:  CIWA-Ar Total: 1 COWS:  COWS Total Score: 2  Musculoskeletal: Strength & Muscle Tone: within normal limits Gait & Station: normal Patient leans: N/A  Psychiatric Specialty Exam: Review of Systems  Psychiatric/Behavioral: Positive for depression, suicidal ideas, hallucinations and substance abuse. The patient is nervous/anxious.   All other systems reviewed and are negative.   Blood pressure 121/69, pulse 75, temperature 98 F (36.7 C), temperature source Oral, resp. rate 16, height 5\' 8"  (1.727 m), weight 174.181 kg (384 lb), SpO2 97 %.Body mass index is 58.4 kg/(m^2).  General Appearance: Casual  Eye Contact::  Fair  Speech:  Clear and Coherent  Volume:  Normal  Mood:  Anxious and Depressed  Affect:  Depressed  Thought Process:  Coherent  Orientation:  Full (Time, Place, and Person)  Thought Content:  Hallucinations: Auditory Command:  kill self and room mate as well as other people, Paranoid Ideation and Rumination with some improvement this AM  Suicidal Thoughts:  Yes.  without intent/plan  Homicidal Thoughts:  Yes.  without intent/plan  Memory:  Immediate;   Fair Recent;   Fair Remote;   Fair  Judgement:  Impaired  Insight:  Shallow  Psychomotor Activity:  Decreased  Concentration:  Poor  Recall:  [employer] of Knowledge:Fair  Language: Fair  Akathisia:  No  Handed:  Right  AIMS (if indicated):      Assets:  Others:  access to healthcare  ADL's:  Intact  Cognition: WNL  Sleep:  Number of Hours: 6.75   Treatment Plan Summary:Bruce [name] is a 40 year old African-American male,who has a hx of alcohol abuse, schizophrenia, who presented to [facility] with worsening psychosis and SI. Pt today continues to be depressed ,suicidal, homicidal and has command AH. Will continue treatment.  Daily contact with patient to assess and evaluate symptoms and progress in treatment and Medication management  Reviewed past medical records,treatment plan.   Will increase Geodon to 80 mg PO twice a day.  Discussed medication side effects and benefits.  Patient does not want any medication that cause further weight gain.   Taper off Prozac for lack of efficacy. Increase Effexor XR to 75 mg po  daily for affective sx. Will continue Cogentin 0.5 mg po bid for EPS. Will  Continue Doxepin to 20 mg po qhs for sleep. Will make available PRN medications as per agitation protocol. Will continue to monitor vitals ,medication compliance and treatment side effects while patient is here.  Will monitor for medical issues as well as call consult as needed.  Reviewed labs ,PL elevated - change to geodon should help with the same. Recreational therapist consult.  CSW will start working on disposition.  Patient to participate in therapeutic milieu .      Elmond Poehlman MD 09/30/2015, 10:50 AM

## 2015-09-30 NOTE — [facility] Group Notes (Signed)
[facility] LCSW Group Therapy  09/30/2015 , 2:55 PM   Type of Therapy:  Group Therapy  Participation Level:  Active  Participation Quality:  Attentive  Affect:  Appropriate  Cognitive:  Alert  Insight:  Improving  Engagement in Therapy:  Engaged  Modes of Intervention:  Discussion, Exploration and Socialization  Summary of Progress/Problems: Today's group focused on the term Diagnosis.  Participants were asked to define the term, and then pronounce whether it is a negative, positive or neutral term. Donald stayed the entire time, attentive, minimal interaction.  Stated he learns from others here by hearing their stories and identifying commonalities.  Daryel Gerald B 09/30/2015 , 2:55 PM

## 2015-09-30 NOTE — Progress Notes (Signed)
D:Patientnin bed on approach.  Patient states he wants to stay in his room because the voices are bad.  Patient states he has passive SI and hears voices telling him to harm other people.  Patient states it is not anyone in particular.  Patient verbally contracts for safety,  Patient states his goal for today was to take his medications. Patient states he met his goal.  Patient also states he sees shadows on the wall in his room. A: Staff to monitor Q 15 mins for safety.  Encouragement and support offered.  Scheduled medications administered per orders. R: Patient remains safe on the unit.  Patient did not attend group tonight.  Patient not visible on the unit tonight.  Patient taking administered medications.

## 2015-10-01 MED ORDER — OLANZAPINE 2.5 MG PO TABS
2.5000 mg | ORAL_TABLET | ORAL | Status: DC
  Administered 2015-10-01 – 2015-10-02 (×3): 2.5 mg via ORAL
  Filled 2015-10-01 (×9): qty 1

## 2015-10-01 MED ORDER — METFORMIN HCL 500 MG PO TABS
500.0000 mg | ORAL_TABLET | Freq: Every day | ORAL | Status: DC
  Administered 2015-10-02 – 2015-10-09 (×8): 500 mg via ORAL
  Filled 2015-10-01 (×5): qty 1
  Filled 2015-10-01: qty 7
  Filled 2015-10-01 (×2): qty 1
  Filled 2015-10-01: qty 7
  Filled 2015-10-01 (×2): qty 1

## 2015-10-01 MED ORDER — LORAZEPAM 1 MG PO TABS
1.0000 mg | ORAL_TABLET | ORAL | Status: AC | PRN
  Administered 2015-10-03: 1 mg via ORAL
  Filled 2015-10-01: qty 1

## 2015-10-01 MED ORDER — RISPERIDONE 2 MG PO TBDP
2.0000 mg | ORAL_TABLET | Freq: Three times a day (TID) | ORAL | Status: DC | PRN
  Administered 2015-10-01 – 2015-10-06 (×6): 2 mg via ORAL
  Filled 2015-10-01 (×6): qty 2

## 2015-10-01 MED ORDER — ZIPRASIDONE MESYLATE 20 MG IM SOLR: 20.0000 mg | INTRAMUSCULAR | Status: DC | PRN

## 2015-10-01 MED ORDER — ZIPRASIDONE HCL 20 MG PO CAPS
20.0000 mg | ORAL_CAPSULE | Freq: Two times a day (BID) | ORAL | Status: DC
  Administered 2015-10-01 – 2015-10-02 (×2): 20 mg via ORAL
  Filled 2015-10-01 (×6): qty 1

## 2015-10-01 NOTE — Clinical Social Work Note (Addendum)
Patient referred to [facility] [place] [id number].  Demographics given to [facility], fax received at facility and under review.  Santa Genera, LCSW Lead Clinical Social Worker Phone:  [phone]

## 2015-10-01 NOTE — Progress Notes (Signed)
D. Pt presents with depressed mood, affect flat. Upon approach this am writer found pt resting in bed quietly. He was awoken and immediately reported that he continued to have auditory hallucinations, and that he was seeing shadows and also reported that he continued to have command auditory hallucinations to '' kill somebody '' . He denies any one in specific and denies any thoughts to kill himself. He did not appear responding to internal stimuli with writer during assessment but requested prn medications to help with this. A. Medications given including prn medication for described above. He continues to be isolative to his room, with minimal interaction with staff or peers. He did complete his self inventory form and rates his depression at 8/10 on scale, 10 being worst, 0 being least. He rates his hopelessness at 7/10 on same scale, and anxiety at 6/10 on same scale. He did report suicidal thoughts on his self inventory stating '' almost all of the time '' but was able to contract for safety with Clinical research associate. He states his goal is to '' get on the right meds '' R. Pt is safe, in no acute distress at this time. Discussed above and patients reports with dr. Elna Breslow and treatment team. Pt remains on q 15 safety checks per policy.

## 2015-10-01 NOTE — Progress Notes (Signed)
Patient ID: Bruce [name], male   DOB: 1975/10/15, 40 y.o.   MRN: [id number] D: Client visible on unit, in dayroom watching TV. Client reports anxiety "7" of 10, "I been hearing voices all day long" "telling me to kill myself and others" Client denies plans, "just the voices telling me" Client also reports no where to go upon discharge "homeless right now" A: Writer encourage client to come to staff if voices become overwhelming and tell him of a plan to hurt self/others. Writer reviewed medications, administered as ordered. Staff will monitor q85min for safety. R: Client is safe on the unit, attended group.

## 2015-10-01 NOTE — Plan of Care (Signed)
Problem: Diagnosis: Increased Risk For Suicide Attempt Goal: LTG-Patient Will Report Improvement in Psychotic Symptoms LTG (by discharge) : Patient will report improvement in psychotic symptoms.  Outcome: Not Progressing Pt continues to report both auditory hallucinations and thoughts of harming self and others. He is able to contract for safety and no actions of self injurious behaviors noted, however pt denies any improvement of AH/VH.  Comments:  Pt continues to report both auditory hallucinations and thoughts of harming self and others. He is able to contract for safety and no actions of self injurious behaviors noted, however pt denies any improvement of AH/VH.

## 2015-10-01 NOTE — Progress Notes (Signed)
Recreation Therapy Notes  Animal-Assisted Activity (AAA) Program Checklist/Progress Notes Patient Eligibility Criteria Checklist & Daily Group note for Rec Tx Intervention  Date: 02.07.2017 Time: 2:45pm Location: [address]    AAA/T Program Assumption of Risk Form signed by Patient/ or Parent Legal Guardian yes  Patient is free of allergies or sever asthma yes  Patient reports no fear of animals yes  Patient reports no history of cruelty to animals yes  Patient understands his/her participation is voluntary yes  Patient washes hands before animal contact yes  Patient washes hands after animal contact yes  Behavioral Response: Appropriate  Education: Hand Washing, Appropriate Animal Interaction   Education Outcome: Acknowledges education.   Clinical Observations/Feedback: Patient evaluated for appropriateness to attend session, MD and LRT in agreement patient can attend session. Patient offered opportunity to attend and interact with therapy dog. Patient responded "I don't have to touch it or anything do I?" LRT assured patient he would not have to interact with therapy dog if he did not want to, but was welcome to attend session. Patient signed necessary consent form and attended session. Patient isolated from other group members during group, made not statements, did not appear in distress or agitated. Patient remained in session for duration and returned to [address] hall without incident following group session.   Marykay Lex Aly Seidenberg, LRT/CTRS  Vincie Linn L 10/01/2015 9:59 AM

## 2015-10-01 NOTE — Clinical Social Work Note (Signed)
Pt referred to [facility], appt today at 3:30 on unit w DSS worker.  Santa Genera, LCSW Lead Clinical Social Worker Phone:  [phone]

## 2015-10-01 NOTE — Progress Notes (Signed)
[facility] MD Progress Note  10/01/2015 2:02 PM Bruce [name]  MRN:  [id number] Subjective: Patient states "I still feel depressed, I am still hearing voices asking me to kill people or they will kill my mother.'      Objective: Bruce [name] is a 40 year old African-American male. Admitted to [facility] from the [facility] observation unit with complaints of auditory hallucinations telling him to kill himself & others.  Patient seen and chart reviewed.  Patient remains very depressed - rates it at an 8/10 . Pt also with continues AH asking him to kill people as well as kill self. Pt  endorses SI , pt denies plan.  Pt seen as less withdrawn , is more interactive on the unit. Pt is seen as attending groups with encouragement. Pt per staff - continues to be psychotic , depressed. Pt will continue to need encouragement and support.    Principal Problem: Schizophrenia (HCC) Diagnosis:   Patient Active Problem List   Diagnosis Date Noted  . Paranoid schizophrenia (HCC) [F20.0]   . 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 Alcohol use disorder, moderate, dependence (HCC) [F10.20] 09/22/2015  . Morbid obesity (HCC) [E66.01] 09/22/2015  . Schizophrenia (HCC) [F20.9] 09/21/2015   Total Time spent with patient: 30 minutes  Past Psychiatric History: please see H&P  Past Medical History:  Past Medical History  Diagnosis Date  . Depression   . Schizophrenia (HCC)   . Obesity    Family History: Denies hx of HTN,dm,cardiac disease, thyroid disease in family.  Family History  Problem Relation Age of Onset  . Schizophrenia Mother    Family Psychiatric  History: Mother has schizophrenia. Pt denies substance abuse in family. Social History: Pt is single, is currently homeless,denies legal issues. History  Alcohol Use  . 16.8 oz/week  . 28 Cans of beer per week     History  Drug Use No    Social History   Social History  . Marital Status: Single    Spouse Name: N/A  . Number of Children: N/A  . Years  of Education: N/A   Social History Main Topics  . Smoking status: Never Smoker   . Smokeless tobacco: None  . Alcohol Use: 16.8 oz/week    28 Cans of beer per week  . Drug Use: No  . Sexual Activity: Not Currently   Other Topics Concern  . None   Social History Narrative   Additional Social History:  Sleep: Fair  Appetite:  Poor improving  Current Medications: Current Facility-Administered Medications  Medication Dose Route Frequency Provider Last Rate Last Dose  . acetaminophen (TYLENOL) tablet 650 mg  650 mg Oral Q6H PRN Charm Rings, NP   650 mg at 09/23/15 0805  . alum & mag hydroxide-simeth (MAALOX/MYLANTA) 200-200-20 MG/5ML suspension 30 mL  30 mL Oral Q4H PRN Charm Rings, NP      . benztropine (COGENTIN) tablet 0.5 mg  0.5 mg Oral BID WC Jomarie Longs, MD   0.5 mg at 10/01/15 0806  . doxepin (SINEQUAN) capsule 20 mg  20 mg Oral QHS Jomarie Longs, MD   20 mg at 09/30/15 2152  . loperamide (IMODIUM) capsule 2 mg  2 mg Oral PRN Thermon Leyland, NP   2 mg at 09/30/15 0744  . risperiDONE (RISPERDAL M-TABS) disintegrating tablet 2 mg  2 mg Oral Q8H PRN Jomarie Longs, MD   2 mg at 10/01/15 1156   And  . LORazepam (ATIVAN) tablet 1 mg  1 mg Oral PRN  Jomarie Longs, MD       And  . ziprasidone (GEODON) injection 20 mg  20 mg Intramuscular PRN Jomarie Longs, MD      . magnesium hydroxide (MILK OF MAGNESIA) suspension 30 mL  30 mL Oral Daily PRN Charm Rings, NP      . Melene Muller ON 10/02/2015] metFORMIN (GLUCOPHAGE) tablet 500 mg  500 mg Oral Q breakfast Jacier Gladu, MD      . multivitamin with minerals tablet 1 tablet  1 tablet Oral Daily Charm Rings, NP   1 tablet at 10/01/15 [phone]  . OLANZapine (ZYPREXA) tablet 2.5 mg  2.5 mg Oral [id number] Kelseigh Diver, MD      . venlafaxine XR (EFFEXOR-XR) 24 hr capsule 75 mg  75 mg Oral Q breakfast Jomarie Longs, MD   75 mg at 10/01/15 0806  . ziprasidone (GEODON) capsule 20 mg  20 mg Oral BID WC Jomarie Longs, MD        Lab  Results:  No results found for this or any previous visit (from the past 48 hour(s)).  Physical Findings: AIMS: Facial and Oral Movements Muscles of Facial Expression: None, normal Lips and Perioral Area: None, normal Jaw: None, normal Tongue: None, normal,Extremity Movements Upper (arms, wrists, hands, fingers): None, normal Lower (legs, knees, ankles, toes): None, normal, Trunk Movements Neck, shoulders, hips: None, normal, Overall Severity Severity of abnormal movements (highest score from questions above): None, normal Incapacitation due to abnormal movements: None, normal Patient's awareness of abnormal movements (rate only patient's report): No Awareness, Dental Status Current problems with teeth and/or dentures?: No Does patient usually wear dentures?: No  CIWA:  CIWA-Ar Total: 1 COWS:  COWS Total Score: 2  Musculoskeletal: Strength & Muscle Tone: within normal limits Gait & Station: normal Patient leans: N/A  Psychiatric Specialty Exam: Review of Systems  Psychiatric/Behavioral: Positive for depression, suicidal ideas, hallucinations and substance abuse. The patient is nervous/anxious.   All other systems reviewed and are negative.   Blood pressure 131/84, pulse 69, temperature 98.4 F (36.9 C), temperature source Oral, resp. rate 20, height  (1.727 m), weight 174.181 kg (384 lb), SpO2 97 %.Body mass index is 58.4 kg/(m^2).  General Appearance: Casual  Eye Contact::  Fair  Speech:  Clear and Coherent  Volume:  Normal  Mood:  Anxious and Depressed  Affect:  Depressed  Thought Process:  Coherent  Orientation:  Full (Time, Place, and Person)  Thought Content:  Hallucinations: Auditory Command:  kill self and other people or else his mother will be killed, Paranoid Ideation and Rumination   Suicidal Thoughts:  Yes.  without intent/plan  Homicidal Thoughts:  Yes.  without intent/plan  Memory:  Immediate;   Fair Recent;   Fair Remote;   Fair  Judgement:  Impaired   Insight:  Shallow  Psychomotor Activity:  Decreased  Concentration:  Poor  Recall:  [employer] of Knowledge:Fair  Language: Fair  Akathisia:  No  Handed:  Right  AIMS (if indicated):     Assets:  Others:  access to healthcare  ADL's:  Intact  Cognition: WNL  Sleep:  Number of Hours: 6.75   Treatment Plan Summary:Kashus is a 41 year old African-American male,who has a hx of alcohol abuse, schizophrenia, who presented to [facility] with worsening psychosis and SI. Pt today continues to be depressed ,suicidal, homicidal and has command AH. Will continue treatment.  Daily contact with patient to assess and evaluate symptoms and progress in treatment and Medication management  Reviewed past medical records,treatment plan.   Will taper off Geodon for lack of efficacy . Will add Zyprexa 2.5 mg po tid for psychosis. Discussed with pt about weightgain, metabolic side effects. Will add Metformin 500 mg po daily for weight gain 2/2 antipsychotic therapy. Continue Effexor XR  75 mg po daily for affective sx. Will continue Cogentin 0.5 mg po bid for EPS. Will continue Doxepin  20 mg po qhs for sleep. Will make available PRN medications as per agitation protocol. Will continue to monitor vitals ,medication compliance and treatment side effects while patient is here.  Will monitor for medical issues as well as call consult as needed.  Reviewed labs ,PL elevated - will monitor on an out patient basis. Recreational therapist consult.  CSW will start working on disposition.  Patient to participate in therapeutic milieu .      Tiandra Swoveland MD 10/01/2015, 2:02 PM

## 2015-10-01 NOTE — [facility] Group Notes (Signed)
[facility] LCSW Aftercare Discharge Planning Group Note   10/01/2015 1:50 PM  Participation Quality:  Engaged  Mood/Affect:  Flat  Depression Rating:    Anxiety Rating:    Thoughts of Suicide:  No Will you contract for safety?   NA  Current AVH:  Yes  "Voices telling me bad things"  Plan for Discharge/Comments:  No remarkable change from previous presentation during this stay.  Rates depression and anxiety high and states he is hearing command hallucinations.  Affirms that voices never go away entirely, but they are especially loud and distracting now.  When asked what has worked in the past, he states Abilify injectable.  When asked why he is not taking that now, he is unable to say.  When asked what he is taking now, is unable to say.  States that it might be helpful to call mother, so he will do that today, although "I don't call her much because she has a hard time getting to the phone with her medical condition."  Transportation Means:   Supports:  [place], [place] Distance B

## 2015-10-01 NOTE — [facility] Group Notes (Signed)
[facility] LCSW Group Therapy  10/01/2015 2:00 PM  Type of Therapy: Group Therapy  Participation Level: Active  Participation Quality: Attentive  Affect: Flat  Cognitive: Oriented  Insight: Limited  Engagement in Therapy: Engaged  Modes of Intervention: Discussion and Socialization  Summary of Progress/Problems: [name] from [employer] was here to tell his story of recovery and play his guitar. Pt stayed the entire time. Pt was pleasant and alert for the duration of group.  [name]. [name] 10/01/2015 2:00 PM

## 2015-10-01 NOTE — Progress Notes (Signed)
Adult Psychoeducational Group Note  Date:  10/01/2015 Time:  8:51 PM  Group Topic/Focus:  Wrap-Up Group:   The focus of this group is to help patients review their daily goal of treatment and discuss progress on daily workbooks.  Participation Level:  Active  Participation Quality:  Appropriate  Affect:  Appropriate  Cognitive:  Alert  Insight: Appropriate  Engagement in Group:  Engaged  Modes of Intervention:  Discussion  Additional Comments:  Patient goal for today was to get on right medication. On a scale from 1-10, (1=worst, 10=best) patient rated his day as a 3 because "my medication is not working".  Rosealee Recinos L Jenin Birdsall 10/01/2015, 8:51 PM

## 2015-10-01 NOTE — Progress Notes (Signed)
Recreation Therapy Notes  02.08.2017 Patient offered opportunity to leave unit for general recreation time. Patient declined LRT offer, stating "I'm watching a movie." LRT attempted numerous times to get patient to leave unit, patient consistently denied LRT and chose to remain on unit.   Marykay Lex Shauntavia Brackin, LRT/CTRS   Aarvi Stotts L 10/01/2015 4:11 PM

## 2015-10-02 MED ORDER — ZIPRASIDONE HCL 20 MG PO CAPS
20.0000 mg | ORAL_CAPSULE | Freq: Every day | ORAL | Status: DC
  Filled 2015-10-02 (×2): qty 1

## 2015-10-02 MED ORDER — GABAPENTIN 100 MG PO CAPS
100.0000 mg | ORAL_CAPSULE | Freq: Three times a day (TID) | ORAL | Status: DC
  Administered 2015-10-02 – 2015-10-05 (×8): 100 mg via ORAL
  Filled 2015-10-02 (×11): qty 1

## 2015-10-02 MED ORDER — OLANZAPINE 5 MG PO TABS
5.0000 mg | ORAL_TABLET | ORAL | Status: DC
  Administered 2015-10-02 – 2015-10-03 (×3): 5 mg via ORAL
  Filled 2015-10-02 (×9): qty 1

## 2015-10-02 NOTE — Plan of Care (Signed)
Problem: Alteration in thought process Goal: LTG-Patient verbalizes understanding importance med regimen (Patient verbalizes understanding of importance of medication regimen and need to continue outpatient care.)  Outcome: Progressing Client recognizes the importance of medication regimen AEB acknowledging AH and asking for medication to help relieve them " need get my medicine straight"

## 2015-10-02 NOTE — Progress Notes (Signed)
Patient ID: Bruce [name], male   DOB: 1976/02/26, 40 y.o.   MRN: [id number] D: Client is isolative this shift, spends most of the evening in bed reports "voices the same" "harmful thought" Client denies plan, contracts for safety. A: Writer encouraged client to speak to staff if voices increases, medications reviewed and administered as ordered. Staff to monitor q61min for safety. R: Client is safe on the unit, did not attend group.

## 2015-10-02 NOTE — Progress Notes (Signed)
Adult Psychoeducational Group Note  Date:  10/02/2015 Time:  8:31 PM  Group Topic/Focus:  Wrap-Up Group:   The focus of this group is to help patients review their daily goal of treatment and discuss progress on daily workbooks.  Participation Level:  Did Not Attend  Participation Quality:  Did not attend  Affect:  Did not attend  Cognitive:  Did not attend  Insight: None  Engagement in Group:  Did not attend  Modes of Intervention:  Did not attend  Additional Comments:  Patient did not attend wrap up group tonight.   Anastashia Westerfeld L Fleetwood Pierron 10/02/2015, 8:31 PM

## 2015-10-02 NOTE — Progress Notes (Signed)
[facility] MD Progress Note  10/02/2015 2:26 PM Bruce [name]  MRN:  [id number] Subjective: Patient states "I still feel depressed, my depression is at a 6 today. I am also hearing voices asking me to kill or I will kill your mother.'       Objective: Bruce [name] is a 40 year old African-American male. Admitted to [facility] from the [facility] observation unit with complaints of auditory hallucinations telling him to kill himself & others.  Patient seen and chart reviewed.  Patient remains very depressed - rates it at an 6/10 . Pt also with continues AH asking him to kill people as well as kill self. Pt  endorses SI , pt denies plan.  Discussed coping skills with pt and ways to cope with his AH . Pt has not been disruptive on the unit , usually stays withdrawn. Pt reports that walking helps him with his AH . Discussed to use walking in order to cope with his AH. Pt voiced understanding.  . Pt however is seen as less withdrawn , is more interactive on the unit. Pt is seen as attending groups with encouragement. Pt per staff - continues to be psychotic , depressed. Pt will continue to need encouragement and support.    Principal Problem: Schizophrenia (HCC) Diagnosis:   Patient Active Problem List   Diagnosis Date Noted  . Paranoid schizophrenia (HCC) [F20.0]   . 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 Alcohol use disorder, moderate, dependence (HCC) [F10.20] 09/22/2015  . Morbid obesity (HCC) [E66.01] 09/22/2015  . Schizophrenia (HCC) [F20.9] 09/21/2015   Total Time spent with patient: 30 minutes  Past Psychiatric History: please see H&P  Past Medical History:  Past Medical History  Diagnosis Date  . Depression   . Schizophrenia (HCC)   . Obesity    Family History: Denies hx of HTN,dm,cardiac disease, thyroid disease in family.  Family History  Problem Relation Age of Onset  . Schizophrenia Mother    Family Psychiatric  History: Mother has schizophrenia. Pt denies substance abuse in  family. Social History: Pt is single, is currently homeless,denies legal issues. History  Alcohol Use  . 16.8 oz/week  . 28 Cans of beer per week     History  Drug Use No    Social History   Social History  . Marital Status: Single    Spouse Name: N/A  . Number of Children: N/A  . Years of Education: N/A   Social History Main Topics  . Smoking status: Never Smoker   . Smokeless tobacco: None  . Alcohol Use: 16.8 oz/week    28 Cans of beer per week  . Drug Use: No  . Sexual Activity: Not Currently   Other Topics Concern  . None   Social History Narrative   Additional Social History:  Sleep: Fair  Appetite:  Fair  Current Medications: Current Facility-Administered Medications  Medication Dose Route Frequency Provider Last Rate Last Dose  . acetaminophen (TYLENOL) tablet 650 mg  650 mg Oral Q6H PRN Charm Rings, NP   650 mg at 09/23/15 0805  . alum & mag hydroxide-simeth (MAALOX/MYLANTA) 200-200-20 MG/5ML suspension 30 mL  30 mL Oral Q4H PRN Charm Rings, NP      . benztropine (COGENTIN) tablet 0.5 mg  0.5 mg Oral BID WC Jomarie Longs, MD   0.5 mg at 10/02/15 0747  . doxepin (SINEQUAN) capsule 20 mg  20 mg Oral QHS Jomarie Longs, MD   20 mg at 10/01/15 2122  . gabapentin (NEURONTIN)  capsule 100 mg  100 mg Oral TID Jomarie Longs, MD      . loperamide (IMODIUM) capsule 2 mg  2 mg Oral PRN Thermon Leyland, NP   2 mg at 10/01/15 2126  . risperiDONE (RISPERDAL M-TABS) disintegrating tablet 2 mg  2 mg Oral Q8H PRN Jomarie Longs, MD   2 mg at 10/01/15 2300   And  . LORazepam (ATIVAN) tablet 1 mg  1 mg Oral PRN Jomarie Longs, MD       And  . ziprasidone (GEODON) injection 20 mg  20 mg Intramuscular PRN Sami Roes, MD      . magnesium hydroxide (MILK OF MAGNESIA) suspension 30 mL  30 mL Oral Daily PRN Charm Rings, NP      . metFORMIN (GLUCOPHAGE) tablet 500 mg  500 mg Oral Q breakfast Jomarie Longs, MD   500 mg at 10/02/15 0746  . multivitamin with minerals  tablet 1 tablet  1 tablet Oral Daily Charm Rings, NP   1 tablet at 10/02/15 0746  . OLANZapine (ZYPREXA) tablet 5 mg  5 mg Oral [id number] Deverick Pruss, MD      . venlafaxine XR (EFFEXOR-XR) 24 hr capsule 75 mg  75 mg Oral Q breakfast Jomarie Longs, MD   75 mg at 10/02/15 0747  . [START ON 10/03/2015] ziprasidone (GEODON) capsule 20 mg  20 mg Oral Q supper Jomarie Longs, MD        Lab Results:  No results found for this or any previous visit (from the past 48 hour(s)).  Physical Findings: AIMS: Facial and Oral Movements Muscles of Facial Expression: None, normal Lips and Perioral Area: None, normal Jaw: None, normal Tongue: None, normal,Extremity Movements Upper (arms, wrists, hands, fingers): None, normal Lower (legs, knees, ankles, toes): None, normal, Trunk Movements Neck, shoulders, hips: None, normal, Overall Severity Severity of abnormal movements (highest score from questions above): None, normal Incapacitation due to abnormal movements: None, normal Patient's awareness of abnormal movements (rate only patient's report): No Awareness, Dental Status Current problems with teeth and/or dentures?: No Does patient usually wear dentures?: No  CIWA:  CIWA-Ar Total: 1 COWS:  COWS Total Score: 2  Musculoskeletal: Strength & Muscle Tone: within normal limits Gait & Station: normal Patient leans: N/A  Psychiatric Specialty Exam: Review of Systems  Psychiatric/Behavioral: Positive for depression, suicidal ideas, hallucinations and substance abuse. The patient is nervous/anxious.   All other systems reviewed and are negative.   Blood pressure 142/84, pulse 74, temperature 97.6 F (36.4 C), temperature source Oral, resp. rate 20, height 5\' 8"  (1.727 m), weight 174.181 kg (384 lb), SpO2 97 %.Body mass index is 58.4 kg/(m^2).  General Appearance: Casual  Eye Contact::  Fair  Speech:  Clear and Coherent  Volume:  Normal  Mood:  Anxious and Depressed  Affect:  Depressed   Thought Process:  Coherent  Orientation:  Full (Time, Place, and Person)  Thought Content:  Hallucinations: Auditory Command:  kill self and other people or else his mother will be killed, Paranoid Ideation and Rumination   Suicidal Thoughts:  Yes.  without intent/plan  Homicidal Thoughts:  Yes.  without intent/plan  Memory:  Immediate;   Fair Recent;   Fair Remote;   Fair  Judgement:  Impaired  Insight:  Shallow  Psychomotor Activity:  Decreased  Concentration:  Poor  Recall:  [employer] of Knowledge:Fair  Language: Fair  Akathisia:  No  Handed:  Right  AIMS (if indicated):     Assets:  Others:  access to healthcare  ADL's:  Intact  Cognition: WNL  Sleep:  Number of Hours: 6.25   Treatment Plan Summary:Amarius is a 40 year old African-American male,who has a hx of alcohol abuse, schizophrenia, who presented to [facility] with worsening psychosis and SI. Pt today continues to be depressed ,suicidal, homicidal and has command AH. Will continue treatment.  Daily contact with patient to assess and evaluate symptoms and progress in treatment and Medication management  Reviewed past medical records,treatment plan.   Will taper off Geodon for lack of efficacy . Will titrate Zyprexa higher - 5 mg po tid for psychosis. Discussed with pt about weightgain, metabolic side effects. Added Metformin 500 mg po daily for weight gain 2/2 antipsychotic therapy. Continue Effexor XR  75 mg po daily for affective sx. Will continue Cogentin 0.5 mg po bid for EPS. Will continue Doxepin  20 mg po qhs for sleep. Will make available PRN medications as per agitation protocol. Will continue to monitor vitals ,medication compliance and treatment side effects while patient is here.  Will monitor for medical issues as well as call consult as needed.  Reviewed labs ,PL elevated - will monitor on an out patient basis. Recreational therapist consult.  CSW will start working on disposition.  Patient to participate  in therapeutic milieu .      Lancer Thurner MD 10/02/2015, 2:26 PM

## 2015-10-02 NOTE — [facility] Group Notes (Signed)
[facility] Group Notes:  (Counselor/Nursing/MHT/Case Management/Adjunct)  10/02/2015 1:15PM  Type of Therapy:  Group Therapy  Participation Level:  Active  Participation Quality:  Appropriate  Affect:  Flat  Cognitive:  Oriented  Insight:  Improving  Engagement in Group:  Limited  Engagement in Therapy:  Limited  Modes of Intervention:  Discussion, Exploration and Socialization  Summary of Progress/Problems: The topic for group was balance in life.  Pt participated in the discussion about when their life was in balance and out of balance and how this feels.  Pt discussed ways to get back in balance and short term goals they can work on to get where they want to be. "Balance equals growth and moving forward.  I don't feel like that is me.  But if I was on the right medication, that would be a good step.  And finding a place to stay would be the other one. York Spaniel he has no idea how these things can come to fruition.  Goal directed, engaged, under no distress.  Was given opportunity to give a peer feedback, but he declined.   Bruce [name] 10/02/2015 1:24 PM

## 2015-10-02 NOTE — Progress Notes (Signed)
DAR NOTE: Patient is calm and pleasant on approach.  No signs of distress noted.  Jokes with staff at times.  No visible signs of anxiety noted.  Patient continues to report suicidal thoughts but contracts for safety.  Reports auditory and visual hallucinations but would not report what the voices are saying to him.  Rates depression at 8, hopelessness at 9, and anxiety at 7 on self inventory form.  Describes energy level as low and concentration as poor.  Maintained on routine safety checks.  Medications given as prescribed.  Support and encouragement offered as needed.  Patient not responding to internal stimuli but is calm and quiet while watching TV.  Respond to staff appropriately when prompted.  Attended group and participated.  States goal for today is "to get on the right medication."  Minimal interaction with staff and peers. Offered no complaint.

## 2015-10-02 NOTE — [facility] Group Notes (Signed)
[facility] Group Notes:  (Nursing/MHT/Case Management/Adjunct)  Date:  10/02/2015   Time:  0930 Type of Therapy:  Nurse Education  Participation Level:  Active  Participation Quality:  Appropriate and Attentive  Affect:  Appropriate  Cognitive:  Alert and Appropriate  Insight:  Appropriate and Good  Engagement in Group:  Engaged  Modes of Intervention:  Activity, Discussion, Education and Exploration  Summary of Progress/Problems: Topic was on leisure and lifestyle changes. Discussed the importance of choosing a healthy leisure activities. Group encouraged to surround themselves with positive and healthy group/support system when changing to a healthy lifestyle. Patient was receptive and contributed.   Mickie Bail 10/02/2015, 12:26 PM

## 2015-10-03 MED ORDER — OLANZAPINE 5 MG PO TABS
5.0000 mg | ORAL_TABLET | ORAL | Status: DC
  Administered 2015-10-04 – 2015-10-09 (×11): 5 mg via ORAL
  Filled 2015-10-03 (×2): qty 1
  Filled 2015-10-03: qty 14
  Filled 2015-10-03 (×2): qty 1
  Filled 2015-10-03: qty 2
  Filled 2015-10-03 (×8): qty 1
  Filled 2015-10-03 (×2): qty 14
  Filled 2015-10-03: qty 1
  Filled 2015-10-03: qty 14

## 2015-10-03 MED ORDER — OLANZAPINE 10 MG PO TABS
10.0000 mg | ORAL_TABLET | Freq: Every day | ORAL | Status: DC
  Administered 2015-10-03: 10 mg via ORAL
  Filled 2015-10-03 (×3): qty 1

## 2015-10-03 MED ORDER — VENLAFAXINE HCL ER 150 MG PO CP24
150.0000 mg | ORAL_CAPSULE | Freq: Every day | ORAL | Status: DC
  Administered 2015-10-04 – 2015-10-05 (×2): 150 mg via ORAL
  Filled 2015-10-03 (×4): qty 1

## 2015-10-03 NOTE — [facility] Group Notes (Signed)
[facility] LCSW Group Therapy  10/03/2015  1:05 PM  Type of Therapy:  Group therapy  Participation Level:  Active  Participation Quality:  Attentive  Affect:  Flat  Cognitive:  Oriented  Insight:  Limited  Engagement in Therapy:  Limited  Modes of Intervention:  Discussion, Socialization  Summary of Progress/Problems:  Chaplain was here to lead a group on themes of hope and courage. "Hope is starting over again.  I've done some bad things, and I need a fresh start.  I feel like that is happening for me now."  Went on to say that he gets encouragement from his mother, and he gives her encouragement as well.  "My hope is that I have a place to go when I leave here."  Interestingly enough, did not identify being symptoms free so that he could get into a place.  Bruce [name] B 10/03/2015 1:22 PM

## 2015-10-03 NOTE — [facility] Group Notes (Signed)
[facility] LCSW Aftercare Discharge Planning Group Note   10/03/2015 9:19 AM  Participation Quality:    Mood/Affect:  Flat  Depression Rating:  7  Anxiety Rating:  7  Thoughts of Suicide:  No Will you contract for safety?   NA  Current AVH:  Yes  Plan for Discharge/Comments:  "I think the meds are starting to work.  I am feeling a little bit better."  Transportation Means:   Supports:  Daryel Gerald B

## 2015-10-03 NOTE — Tx Team (Signed)
Interdisciplinary Treatment Plan Update (Adult)  Date:  10/03/2015  Time Reviewed:  11:29 AM   Progress in Treatment: Attending groups: No.Pt new to unit. Continuing to assess.  Participating in groups:  No. Taking medication as prescribed:  Yes. Tolerating medication:  Yes. Family/Significant othe contact made:  SPE required for this pt prior to discharge.  Patient understands diagnosis:  Yes. and As evidenced by:  seeking treatment for SI, AH, depression, and medication stabilization. Discussing patient identified problems/goals with staff:  Yes. Medical problems stabilized or resolved:  Yes. Denies suicidal/homicidal ideation: Yes. Issues/concerns per patient self-inventory:  Other:  Discharge Plan or Barriers: CSW assessing for appropriate referrals. Pt reports that he has no current providers.   Reason for Continuation of Hospitalization: Depression Hallucinations Medication stabilization Suicidal ideation  Comments:  Bruce [name] is an 40 y.o. male presenting to [place] voluntarily for suicidal ideations with a plan to get hit by a train. Patient states that he was on the train tracks and started to think about how his suicide would impact his mother and he got off of the track. Patient states that he has auditory command hallucinations that trigger his SI. Patient states that the voices also tell him to kill others but there is not a specific person. Patient states that he has experienced hallucinations since his 74s and feels that the hallucinations have increased recently and he is not sure why. Patient states that he has been prescribed Haldol in the past but stopped taking it because he does not feel like it works. Patient states that he Abilify has worked in the past , however, he cannot afford it once he is released from a facility. Patient states that he last took medications about three days ago when he was in another facility, although he cannot remember the name of the  facility. Patient states that he has attempted suicide two tines with the last time being in October of 2016 where he overdosed on pills at his aunts house. He states that he was admitted to  [facility] and realized that the pills were "herbal pills." Patient states that he has been to other facilities since that time, including [facility]. Patient denies outpatient treatment stating that he is unable to afford it. Patient endorses HI but denies identified victim, intent, or plan to hurt someone. Patient denies visual hallucinations. Patient denies use of drugs and alcohol. Patient UDS and BAL both clear at time of assessment.. Diagnosis: Schizophrenia   09/25/15:Continues with depression, SI with command hallucincations, as well as poor sleep.  Has not identified dispositional plan.  Will increase Prozac to 60 mg po daily for affective sx. Will continue Prolixin 5 mg po daily and 10 mg po qhs for psychosis. Will add Abilify po qpm for psychosis- to augment the effect of prolixin as well as to help with elevated PL. Will continue Cogentin 0.5 mg po bid for EPS. Will increase Trazodone to 150 mg po qhs for sleep.  09/30/15: Pt today continues to be depressed ,suicidal, homicidal and has command [place]. Will continue treatment. Will increase Geodon to 80 mg PO twice a day. Discussed medication side effects and benefits. Patient does not want any medication that cause further weight gain.  Taper off Prozac for lack of efficacy. Increase Effexor XR to 75 mg po daily for affective sx. Will continue Cogentin 0.5 mg po bid for EPS. Will Continue Doxepin to 20 mg po qhs for sleep.  10/03/15: Will taper off Geodon for lack of efficacy .  Will titrate Zyprexa higher - 5 mg po tid for psychosis. Discussed with pt about weightgain, metabolic side effects. Added Metformin 500 mg po daily for weight gain 2/2 antipsychotic therapy. Continue Effexor XR 75 mg po daily for affective sx. Will continue Cogentin 0.5 mg po  bid for EPS. Will continue Doxepin 20 mg po qhs for sleep.  Estimated length of stay:  4-5 days   New goal(s): to develop effective aftercare plan.   Additional Comments:  Patient and CSW reviewed pt's identified goals and treatment plan. Patient verbalized understanding and agreed to treatment plan. CSW reviewed [facility] "Discharge Process and Patient Involvement" Form. Pt verbalized understanding of information provided and signed form.    Review of initial/current patient goals per problem list:  1. Goal(s): Patient will participate in aftercare plan  Met: No.   Target date: at discharge  As evidenced by: Patient will participate within aftercare plan AEB aftercare provider and housing plan at discharge being identified.  1/30: CSW assessing. During last admission, pt went to [facility]. 09/25/15:  Pt called [facility].  Was declined.  States his plan is to go to [facility] shelter, but understands he has to go through [facility], and appears to be despondent over having no safe place to stay.  Will continue to explore options with pt. 09/30/15:  States he has been calling [facility] in [place], but no openings.  Willing to go to [place] if they have an opening 10/03/15:  Met with Alex from [place] program-continues to want to stay in [place] nearer his mother  2. Goal (s): Patient will exhibit decreased depressive symptoms and suicidal ideations.  Met: No.    Target date: at discharge  As evidenced by: Patient will utilize self rating of depression at 3 or below and demonstrate decreased signs of depression or be deemed stable for discharge by MD.  1/30: Pt rates depression as high. Denies SI/HI.  09/25/15:  Rates depression at an 8.  Endorses SI, and contracts for safety 09/30/15:  No change 10/03/15:  [name] denies SI today, states his depression is "somewhat" decreased  3. Goal(s): Patient will demonstrate decreased signs and symptoms of psychosis.   Met:No.   Target date: at  discharge  As evidenced by: Patient will demonstrate decreased signs of paranoia, or be deemed stable for discharge by MD  1/30: Pt continues to report AH telling him to hurt himself and others. He is in the process of being moved to [address] hall due to level of psychosis.  09/25/15: Continues to c/o command hallucinations 09/30/15:  No progress 10/03/15: AH no longer command in nature  Attendees: Patient:   10/03/2015 11:29 AM   Family:   10/03/2015 11:29 AM   Physician:  Ursula Alert  10/03/2015 11:29 AM   Nursing:   Hedy Jacob RN 10/03/2015 11:29 AM   Clinical Social Worker: Ripley Fraise LCSW 10/03/2015 11:29 AM   Clinical Social Worker: 10/03/2015 11:29 AM   Other:  Gerline Legacy Nurse Case Manager 10/03/2015 11:29 AM   Other:   10/03/2015 11:29 AM   Other:   10/03/2015 11:29 AM   Other:  10/03/2015 11:29 AM   Other:  10/03/2015 11:29 AM   Other:  10/03/2015 11:29 AM    10/03/2015 11:29 AM    10/03/2015 11:29 AM    10/03/2015 11:29 AM    10/03/2015 11:29 AM    Scribe for Treatment Team:   Ripley Fraise  10/03/2015 11:29 AM

## 2015-10-03 NOTE — Progress Notes (Addendum)
[facility] MD Progress Note  10/03/2015 2:16 PM Bruce [name]  MRN:  [id number] Subjective: Patient states "I still feel depressed, my depression is better, but I am hearing voices asking me to kill ."       Objective: Bruce [name] is a 40 year old African-American male. Admitted to [facility] from the [facility] observation unit with complaints of auditory hallucinations telling him to kill himself & others.  Patient seen and chart reviewed.  Patient reports that he continues to be depressed and is having AH that is command. However per staff - pt is seen as more interactive on the unit than before , making progress, seen as attending groups as well as is compliant on medications.  Discussed coping skills with pt and ways to cope with his AH .  Pt voiced understanding. Will continue to encourage and support.   Principal Problem: Schizophrenia (HCC) Diagnosis:   Patient Active Problem List   Diagnosis Date Noted  . Paranoid schizophrenia (HCC) [F20.0]   . 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 Alcohol use disorder, moderate, dependence (HCC) [F10.20] 09/22/2015  . Morbid obesity (HCC) [E66.01] 09/22/2015  . Schizophrenia (HCC) [F20.9] 09/21/2015   Total Time spent with patient: 30 minutes  Past Psychiatric History: please see H&P  Past Medical History:  Past Medical History  Diagnosis Date  . Depression   . Schizophrenia (HCC)   . Obesity    Family History: Denies hx of HTN,dm,cardiac disease, thyroid disease in family.  Family History  Problem Relation Age of Onset  . Schizophrenia Mother    Family Psychiatric  History: Mother has schizophrenia. Pt denies substance abuse in family. Social History: Pt is single, is currently homeless,denies legal issues. History  Alcohol Use  . 16.8 oz/week  . 28 Cans of beer per week     History  Drug Use No    Social History   Social History  . Marital Status: Single    Spouse Name: N/A  . Number of Children: N/A  . Years of Education: N/A    Social History Main Topics  . Smoking status: Never Smoker   . Smokeless tobacco: None  . Alcohol Use: 16.8 oz/week    28 Cans of beer per week  . Drug Use: No  . Sexual Activity: Not Currently   Other Topics Concern  . None   Social History Narrative   Additional Social History:  Sleep: Fair  Appetite:  Fair  Current Medications: Current Facility-Administered Medications  Medication Dose Route Frequency Provider Last Rate Last Dose  . acetaminophen (TYLENOL) tablet 650 mg  650 mg Oral Q6H PRN Charm Rings, NP   650 mg at 09/23/15 0805  . alum & mag hydroxide-simeth (MAALOX/MYLANTA) 200-200-20 MG/5ML suspension 30 mL  30 mL Oral Q4H PRN Charm Rings, NP      . benztropine (COGENTIN) tablet 0.5 mg  0.5 mg Oral BID WC Jomarie Longs, MD   0.5 mg at 10/03/15 0820  . doxepin (SINEQUAN) capsule 20 mg  20 mg Oral QHS Jomarie Longs, MD   20 mg at 10/02/15 2239  . gabapentin (NEURONTIN) capsule 100 mg  100 mg Oral TID Jomarie Longs, MD   100 mg at 10/03/15 1131  . loperamide (IMODIUM) capsule 2 mg  2 mg Oral PRN Thermon Leyland, NP   2 mg at 10/03/15 1415  . magnesium hydroxide (MILK OF MAGNESIA) suspension 30 mL  30 mL Oral Daily PRN Charm Rings, NP      .  metFORMIN (GLUCOPHAGE) tablet 500 mg  500 mg Oral Q breakfast Jomarie Longs, MD   500 mg at 10/03/15 9604  . multivitamin with minerals tablet 1 tablet  1 tablet Oral Daily Charm Rings, NP   1 tablet at 10/03/15 [phone]  . OLANZapine (ZYPREXA) tablet 10 mg  10 mg Oral QHS Jomarie Longs, MD      . Melene Muller ON 10/04/2015] OLANZapine (ZYPREXA) tablet 5 mg  5 mg Oral [id number] Jakaleb Payer, MD      . risperiDONE (RISPERDAL M-TABS) disintegrating tablet 2 mg  2 mg Oral Q8H PRN Jomarie Longs, MD   2 mg at 10/03/15 1416   And  . ziprasidone (GEODON) injection 20 mg  20 mg Intramuscular PRN Jomarie Longs, MD      . Melene Muller ON 10/04/2015] venlafaxine XR (EFFEXOR-XR) 24 hr capsule 150 mg  150 mg Oral Q breakfast Joncarlo Friberg, MD         Lab Results:  No results found for this or any previous visit (from the past 48 hour(s)).  Physical Findings: AIMS: Facial and Oral Movements Muscles of Facial Expression: None, normal Lips and Perioral Area: None, normal Jaw: None, normal Tongue: None, normal,Extremity Movements Upper (arms, wrists, hands, fingers): None, normal Lower (legs, knees, ankles, toes): None, normal, Trunk Movements Neck, shoulders, hips: None, normal, Overall Severity Severity of abnormal movements (highest score from questions above): None, normal Incapacitation due to abnormal movements: None, normal Patient's awareness of abnormal movements (rate only patient's report): No Awareness, Dental Status Current problems with teeth and/or dentures?: No Does patient usually wear dentures?: No  CIWA:  CIWA-Ar Total: 1 COWS:  COWS Total Score: 2  Musculoskeletal: Strength & Muscle Tone: within normal limits Gait & Station: normal Patient leans: N/A  Psychiatric Specialty Exam: Review of Systems  Psychiatric/Behavioral: Positive for depression, suicidal ideas, hallucinations and substance abuse. The patient is nervous/anxious.   All other systems reviewed and are negative.   Blood pressure 144/91, pulse 90, temperature 98 F (36.7 C), temperature source Oral, resp. rate 20, height  (1.727 m), weight 174.181 kg (384 lb), SpO2 97 %.Body mass index is 58.4 kg/(m^2).  General Appearance: Casual  Eye Contact::  Fair  Speech:  Clear and Coherent  Volume:  Normal  Mood:  Anxious and Depressed  Affect:  Depressed  Thought Process:  Coherent  Orientation:  Full (Time, Place, and Person)  Thought Content:  Hallucinations: Auditory Command:  kill self and other people or else his mother will be killed, Paranoid Ideation and Rumination   Suicidal Thoughts:  Yes.  without intent/plan  Homicidal Thoughts:  No  Memory:  Immediate;   Fair Recent;   Fair Remote;   Fair  Judgement:  Impaired  Insight:   Shallow  Psychomotor Activity:  Decreased  Concentration:  Poor  Recall:  [employer] of Knowledge:Fair  Language: Fair  Akathisia:  No  Handed:  Right  AIMS (if indicated):     Assets:  Others:  access to healthcare  ADL's:  Intact  Cognition: WNL  Sleep:  Number of Hours: 6.75   Treatment Plan Summary:Yvon is a 40 year old African-American male,who has a hx of alcohol abuse, schizophrenia, who presented to [facility] with worsening psychosis and SI. Pt today continues to be depressed ,suicidal and has command AH. Will continue treatment.  Daily contact with patient to assess and evaluate symptoms and progress in treatment and Medication management  Reviewed past medical records,treatment plan.   Will taper off  Geodon for lack of efficacy . Will titrate Zyprexa higher - 5 mg po bid and 10 mg po qhs for psychosis. Discussed with pt about weightgain, metabolic side effects. Added Metformin 500 mg po daily for weight gain 2/2 antipsychotic therapy. Increase Effexor XR to 150 mg po daily for affective sx. Will continue Cogentin 0.5 mg po bid for EPS. Will continue Doxepin  20 mg po qhs for sleep. Will make available PRN medications as per agitation protocol. Will continue to monitor vitals ,medication compliance and treatment side effects while patient is here.  Will monitor for medical issues as well as call consult as needed.  Reviewed labs ,PL elevated - will monitor on an out patient basis. Recreational therapist consult.  CSW will start working on disposition.  Patient to participate in therapeutic milieu .   I certify that the services received since the previous certification/recertification were and continue to be medically necessary as the treatment provided can be reasonably expected to improve the patient's condition; the medical record documents that the services furnished were intensive treatment services or their equivalent services, and this patient continues to need, on a  daily basis, active treatment furnished directly by or requiring the supervision of inpatient psychiatric personnel.    Onesimo Lingard MD 10/03/2015, 2:16 PM

## 2015-10-03 NOTE — Progress Notes (Signed)
DAR NOTE: Patient presents with anxious affect and depressed mood.  Patient reports auditory and visual hallucinations.  Denies suicidal thoughts during assessment but report suicidal thoughts on self inventory form.  Patient contracts for safety.  Rates depression at 7, hopelessness at 7, and anxiety at 5.  Maintained on routine safety checks.  Medications given as prescribed.  Support and encouragement offered as needed.  Attended group and participated.  States goal for today is "to get on the right medication."  Minimal interaction with staff and peers.  Patient complain of anxiety.  Requested and received Ativan 1 mg and Risperdal M tablet.  Patient in the dayroom most of this shift watching TV.  No signs of him responding to internal stimuli.  Patient is appropriate, calm and pleasant.

## 2015-10-03 NOTE — Progress Notes (Signed)
Recreation Therapy Notes  02.10.2017 LRT again offered patient recreation therapy services. Patient declined. Patient affect brighter than in previous interactions and he pleasantly greeted LRT. Despite initial appearance patient continues to decline recreation therapy services.   Marykay Lex Bejamin Hackbart, LRT/CTRS   Jearl Klinefelter 10/03/2015 3:41 PM

## 2015-10-03 NOTE — Plan of Care (Signed)
Problem: Alteration in thought process Goal: STG-Patient does not respond to command hallucinations Outcome: Progressing Patient is not responding to command hallucination.  Patient is calm and appropriate to situation.

## 2015-10-03 NOTE — Progress Notes (Signed)
Adult Psychoeducational Group Note  Date:  10/03/2015 Time:  8:52 PM  Group Topic/Focus:  Wrap-Up Group:   The focus of this group is to help patients review their daily goal of treatment and discuss progress on daily workbooks.  Participation Level:  Did Not Attend  Participation Quality:  Did not attend  Affect:  Did not attend  Cognitive:  Did not attend  Insight: None  Engagement in Group:  Did not attend  Modes of Intervention:  Did not attend  Additional Comments:  Patient did not attend wrap up group tonight.   Chisom Aust L Lucretia Pendley 10/03/2015, 8:52 PM

## 2015-10-04 MED ORDER — OLANZAPINE 7.5 MG PO TABS
15.0000 mg | ORAL_TABLET | Freq: Every day | ORAL | Status: DC
  Administered 2015-10-04: 15 mg via ORAL
  Filled 2015-10-04 (×2): qty 2

## 2015-10-04 NOTE — Progress Notes (Signed)
DAR NOTE: Patient presents with anxious affect and depressed mood.  Reports suicidal thoughts, auditory and visual hallucinations.  Contracts for safety.  Rates depression at 7, hopelessness at 8, and anxiety at 5.  Maintained on routine safety checks.  Medications given as prescribed.  Support and encouragement offered as needed.  Attended group and participated.  States goal for today is "to take the right medication."  Patient in the dayroom watching TV.  Minimal interaction with staff and peers.  No signs of him responding to internal stimuli.  Patient requested and received Ativan and Risperdal for anxiety and agitation with good effect.

## 2015-10-04 NOTE — [facility] Group Notes (Signed)
[facility] Group Notes:  (Nursing/MHT/Case Management/Adjunct)  Date:  10/04/2015  Time:  10:58 AM  Type of Therapy:  Nurse Education  Participation Level:  Active  Participation Quality:  Appropriate and Attentive  Affect:  Appropriate  Cognitive:  Alert and Appropriate  Insight:  Appropriate and Good  Engagement in Group:  Engaged and Improving  Modes of Intervention:  Activity, Discussion and Education  Summary of Progress/Problems: Topic was on healthy coping skills. Discussed the importance of surrounding oneself with a good support system. Support systems are there to motivate, encourage, and assist with learning new coping skills that leads to a healthy lifestyle. Patient was attentive and receptive.   Mickie Bail 10/04/2015, 10:58 AM

## 2015-10-04 NOTE — [facility] Group Notes (Signed)
[facility] Group Notes:  (Clinical Social Work)  10/04/2015  11:15-12:00PM  Summary of Progress/Problems:   Today's process group involved patients discussing their feelings related to being hospitalized, as well as how they can use their present feelings to create a plan for out how to stay out of the hospital in the future.  A variety of coping skills were discussed in more depth, including use of a pillbox and following up with a psychiatrist and therapist. The patient expressed his primary feeling about being hospitalized is that it was necessary for him to get better.  He reported that he occasionally forgets his medication, and was open to the idea of a pillbox.  He interacted well during group, even when not called on directly.  Type of Therapy:  Group Therapy - Process  Participation Level:  Active  Participation Quality:  Attentive and Sharing  Affect:  Blunted  Cognitive:  Appropriate  Insight:  Engaged  Engagement in Therapy:  Engaged  Modes of Intervention:  Exploration, Discussion  [name], LCSW 10/04/2015, 12:44 PM

## 2015-10-04 NOTE — Progress Notes (Signed)
  D: Pt was laying in bed during the assessment. Informed the writer that he's still hearing voices to harm himself and seeing faces on the wall with red eyes." Stating everything is the same. Pt has no questions or concerns.    A:  Support and encouragement was offered. 15 min checks continued for safety.  R: Pt remains safe.

## 2015-10-04 NOTE — Progress Notes (Signed)
Patient was in bed sleeping at beginning of shift and writer had to wake him up and give him his medications. He was encouraged to try and stay up for a little while so that he would be able to sleep tonight. He did not attend evening group. He sat up in the dayroom briefly eating his snack and returned to his room. He reports that he has auidtory hallucinations telling him to harm self or others and verbally contracts for safety. He denies visual hallucinations. Support givne and safety maitained on unit with 15 min checks.

## 2015-10-04 NOTE — Progress Notes (Signed)
[facility] MD Progress Note  10/04/2015 1:30 PM Bruce [name]  MRN:  [id number] Subjective: Patient states "I still feel depressed,i have SI - no plan, I still hear AH asking me to kill."       Objective: [name] is a 40 year old African-American male. Admitted to [facility] from the [facility] observation unit with complaints of auditory hallucinations telling him to kill himself & others.  Patient seen and chart reviewed.  Patient reports that he continues to be depressed and is having AH that is command. Pt continues to rate his depression as high. However per staff - pt is seen as more interactive on the unit than before , making progress, seen as attending groups as well as is compliant on medications. Will continue to encourage and support.   Principal Problem: Schizophrenia (HCC) Diagnosis:   Patient Active Problem List   Diagnosis Date Noted  . Paranoid schizophrenia (HCC) [F20.0]   . 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 Alcohol use disorder, moderate, dependence (HCC) [F10.20] 09/22/2015  . Morbid obesity (HCC) [E66.01] 09/22/2015  . Schizophrenia (HCC) [F20.9] 09/21/2015   Total Time spent with patient: 25 minutes  Past Psychiatric History: please see H&P  Past Medical History:  Past Medical History  Diagnosis Date  . Depression   . Schizophrenia (HCC)   . Obesity    Family History: Denies hx of HTN,dm,cardiac disease, thyroid disease in family.  Family History  Problem Relation Age of Onset  . Schizophrenia Mother    Family Psychiatric  History: Mother has schizophrenia. Pt denies substance abuse in family. Social History: Pt is single, is currently homeless,denies legal issues. History  Alcohol Use  . 16.8 oz/week  . 28 Cans of beer per week     History  Drug Use No    Social History   Social History  . Marital Status: Single    Spouse Name: N/A  . Number of Children: N/A  . Years of Education: N/A   Social History Main Topics  . Smoking status: Never  Smoker   . Smokeless tobacco: None  . Alcohol Use: 16.8 oz/week    28 Cans of beer per week  . Drug Use: No  . Sexual Activity: Not Currently   Other Topics Concern  . None   Social History Narrative   Additional Social History:  Sleep: Fair  Appetite:  Fair  Current Medications: Current Facility-Administered Medications  Medication Dose Route Frequency Provider Last Rate Last Dose  . acetaminophen (TYLENOL) tablet 650 mg  650 mg Oral Q6H PRN Charm Rings, NP   650 mg at 09/23/15 0805  . alum & mag hydroxide-simeth (MAALOX/MYLANTA) 200-200-20 MG/5ML suspension 30 mL  30 mL Oral Q4H PRN Charm Rings, NP      . benztropine (COGENTIN) tablet 0.5 mg  0.5 mg Oral BID WC Jomarie Longs, MD   0.5 mg at 10/04/15 0828  . doxepin (SINEQUAN) capsule 20 mg  20 mg Oral QHS Jomarie Longs, MD   20 mg at 10/03/15 2135  . gabapentin (NEURONTIN) capsule 100 mg  100 mg Oral TID Jomarie Longs, MD   100 mg at 10/04/15 1203  . loperamide (IMODIUM) capsule 2 mg  2 mg Oral PRN Thermon Leyland, NP   2 mg at 10/03/15 1415  . magnesium hydroxide (MILK OF MAGNESIA) suspension 30 mL  30 mL Oral Daily PRN Charm Rings, NP      . metFORMIN (GLUCOPHAGE) tablet 500 mg  500 mg Oral Q breakfast  Jomarie Longs, MD   500 mg at 10/04/15 0828  . multivitamin with minerals tablet 1 tablet  1 tablet Oral Daily Charm Rings, NP   1 tablet at 10/04/15 [phone]  . OLANZapine (ZYPREXA) tablet 15 mg  15 mg Oral QHS Ballard Budney, MD      . OLANZapine (ZYPREXA) tablet 5 mg  5 mg Oral [id number] Jomarie Longs, MD   5 mg at 10/04/15 1203  . risperiDONE (RISPERDAL M-TABS) disintegrating tablet 2 mg  2 mg Oral Q8H PRN Jomarie Longs, MD   2 mg at 10/03/15 1416   And  . ziprasidone (GEODON) injection 20 mg  20 mg Intramuscular PRN Jomarie Longs, MD      . venlafaxine XR (EFFEXOR-XR) 24 hr capsule 150 mg  150 mg Oral Q breakfast Jomarie Longs, MD   150 mg at 10/04/15 8119    Lab Results:  No results found for this or any  previous visit (from the past 48 hour(s)).  Physical Findings: AIMS: Facial and Oral Movements Muscles of Facial Expression: None, normal Lips and Perioral Area: None, normal Jaw: None, normal Tongue: None, normal,Extremity Movements Upper (arms, wrists, hands, fingers): None, normal Lower (legs, knees, ankles, toes): None, normal, Trunk Movements Neck, shoulders, hips: None, normal, Overall Severity Severity of abnormal movements (highest score from questions above): None, normal Incapacitation due to abnormal movements: None, normal Patient's awareness of abnormal movements (rate only patient's report): No Awareness, Dental Status Current problems with teeth and/or dentures?: No Does patient usually wear dentures?: No  CIWA:  CIWA-Ar Total: 1 COWS:  COWS Total Score: 2  Musculoskeletal: Strength & Muscle Tone: within normal limits Gait & Station: normal Patient leans: N/A  Psychiatric Specialty Exam: Review of Systems  Psychiatric/Behavioral: Positive for depression, suicidal ideas, hallucinations and substance abuse. The patient is nervous/anxious.   All other systems reviewed and are negative.   Blood pressure 142/83, pulse 99, temperature 98.4 F (36.9 C), temperature source Oral, resp. rate 22, height  (1.727 m), weight 174.181 kg (384 lb), SpO2 97 %.Body mass index is 58.4 kg/(m^2).  General Appearance: Casual  Eye Contact::  Fair  Speech:  Clear and Coherent  Volume:  Normal  Mood:  Anxious and Depressed  Affect:  Depressed  Thought Process:  Coherent  Orientation:  Full (Time, Place, and Person)  Thought Content:  Hallucinations: Auditory Command:  kill self and other people , Paranoid Ideation and Rumination   Suicidal Thoughts:  Yes.  without intent/plan  Homicidal Thoughts:  Yes.  without intent/plan people in general  Memory:  Immediate;   Fair Recent;   Fair Remote;   Fair  Judgement:  Impaired  Insight:  Shallow  Psychomotor Activity:  Decreased   Concentration:  Poor  Recall:  [employer] of Knowledge:Fair  Language: Fair  Akathisia:  No  Handed:  Right  AIMS (if indicated):     Assets:  Others:  access to healthcare  ADL's:  Intact  Cognition: WNL  Sleep:  Number of Hours: 6.75   Treatment Plan Summary:Claud is a 40 year old African-American male,who has a hx of alcohol abuse, schizophrenia, who presented to [facility] with worsening psychosis and SI. Pt today continues to be depressed ,suicidal and has command AH as well as HI. Will continue treatment.  Daily contact with patient to assess and evaluate symptoms and progress in treatment and Medication management  Reviewed past medical records,treatment plan.   Geodon tapered off.Will titrate Zyprexa higher - 5 mg po  bid and 15 mg po qhs for psychosis. Discussed with pt about weightgain, metabolic side effects. Added Metformin 500 mg po daily for weight gain 2/2 antipsychotic therapy. Increase Effexor XR to 150 mg po daily for affective sx.Pt received his first dose today. Will continue Cogentin 0.5 mg po bid for EPS. Will continue Doxepin  20 mg po qhs for sleep. Will make available PRN medications as per agitation protocol. Will continue to monitor vitals ,medication compliance and treatment side effects while patient is here.  Will monitor for medical issues as well as call consult as needed.  Reviewed labs ,PL elevated - will monitor on an out patient basis. Recreational therapist consult.  CSW will start working on disposition.  Patient to participate in therapeutic milieu .   I certify that the services received since the previous certification/recertification were and continue to be medically necessary as the treatment provided can be reasonably expected to improve the patient's condition; the medical record documents that the services furnished were intensive treatment services or their equivalent services, and this patient continues to need, on a daily basis, active  treatment furnished directly by or requiring the supervision of inpatient psychiatric personnel.    Luken Shadowens MD 10/04/2015, 1:30 PM

## 2015-10-05 MED ORDER — DOXEPIN HCL 50 MG PO CAPS
50.0000 mg | ORAL_CAPSULE | Freq: Every day | ORAL | Status: DC
  Administered 2015-10-05 – 2015-10-08 (×4): 50 mg via ORAL
  Filled 2015-10-05 (×3): qty 1
  Filled 2015-10-05 (×2): qty 7
  Filled 2015-10-05 (×2): qty 1

## 2015-10-05 MED ORDER — GABAPENTIN 100 MG PO CAPS
200.0000 mg | ORAL_CAPSULE | Freq: Three times a day (TID) | ORAL | Status: DC
  Administered 2015-10-05 – 2015-10-09 (×12): 200 mg via ORAL
  Filled 2015-10-05: qty 2
  Filled 2015-10-05: qty 42
  Filled 2015-10-05: qty 2
  Filled 2015-10-05: qty 42
  Filled 2015-10-05 (×5): qty 2
  Filled 2015-10-05: qty 42
  Filled 2015-10-05 (×4): qty 2
  Filled 2015-10-05 (×2): qty 42
  Filled 2015-10-05 (×2): qty 2
  Filled 2015-10-05: qty 42
  Filled 2015-10-05 (×2): qty 2

## 2015-10-05 MED ORDER — OLANZAPINE 10 MG PO TABS
20.0000 mg | ORAL_TABLET | Freq: Every day | ORAL | Status: DC
  Administered 2015-10-05 – 2015-10-08 (×4): 20 mg via ORAL
  Filled 2015-10-05 (×2): qty 14
  Filled 2015-10-05 (×5): qty 2

## 2015-10-05 MED ORDER — VENLAFAXINE HCL ER 75 MG PO CP24
225.0000 mg | ORAL_CAPSULE | Freq: Every day | ORAL | Status: DC
  Administered 2015-10-06 – 2015-10-09 (×4): 225 mg via ORAL
  Filled 2015-10-05 (×7): qty 1

## 2015-10-05 MED ORDER — DOCUSATE SODIUM 100 MG PO CAPS
100.0000 mg | ORAL_CAPSULE | Freq: Two times a day (BID) | ORAL | Status: DC
  Administered 2015-10-05 – 2015-10-09 (×7): 100 mg via ORAL
  Filled 2015-10-05 (×14): qty 1

## 2015-10-05 NOTE — Progress Notes (Signed)
Adult Psychoeducational Group Note  Date:  10/05/2015 Time:  7:41 PM  Group Topic/Focus:  Wrap-Up Group:   The focus of this group is to help patients review their daily goal of treatment and discuss progress on daily workbooks.  Participation Level:  Did Not Attend  Additional Comments:  Pt was asleep at the time of group.  Bruce [name] C 10/05/2015, 7:41 PM 

## 2015-10-05 NOTE — Progress Notes (Addendum)
[facility] MD Progress Note  10/05/2015 10:52 AM Bruce [name]  MRN:  [id number] Subjective: Patient states "I still feel depressed, I still feel suicidal , I still feel homicidal, I am hearing voices asking me to kill. I want Abilify.'        Objective: Bruce [name] is a 40 year old African-American male. Admitted to [facility] from the [facility] observation unit with complaints of auditory hallucinations telling him to kill himself & others.  Patient seen and chart reviewed.  Patient reports that he continues to be depressed and is having AH that is command. Pt continues to rate his depression as high.Pt reports that none of the medications that we have him on at this time is helping with his sx - he reports ' may be a little bit but not a lot.' Pt was tried on prolixin initially changed to abilify, changed to geodon, changed to zyprexa. Pt currently is asking for Abilify , he states that it has worked for him in the past. It was discussed with pt that he did not respond to it this time- he is adamant that if it is added to his regimen , it will help him. Per staff - pt is seen as more interactive on the unit than before , making progress, seen as attending groups as well as is compliant on medications. Will continue to encourage and support.   Principal Problem: Schizophrenia (HCC) Diagnosis:   Patient Active Problem List   Diagnosis Date Noted  . Paranoid schizophrenia (HCC) [F20.0]   . 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 Alcohol use disorder, moderate, dependence (HCC) [F10.20] 09/22/2015  . Morbid obesity (HCC) [E66.01] 09/22/2015  . Schizophrenia (HCC) [F20.9] 09/21/2015   Total Time spent with patient: 25 minutes  Past Psychiatric History: please see H&P  Past Medical History:  Past Medical History  Diagnosis Date  . Depression   . Schizophrenia (HCC)   . Obesity    Family History: Denies hx of HTN,dm,cardiac disease, thyroid disease in family.  Family History  Problem Relation  Age of Onset  . Schizophrenia Mother    Family Psychiatric  History: Mother has schizophrenia. Pt denies substance abuse in family. Social History: Pt is single, is currently homeless,denies legal issues. History  Alcohol Use  . 16.8 oz/week  . 28 Cans of beer per week     History  Drug Use No    Social History   Social History  . Marital Status: Single    Spouse Name: N/A  . Number of Children: N/A  . Years of Education: N/A   Social History Main Topics  . Smoking status: Never Smoker   . Smokeless tobacco: None  . Alcohol Use: 16.8 oz/week    28 Cans of beer per week  . Drug Use: No  . Sexual Activity: Not Currently   Other Topics Concern  . None   Social History Narrative   Additional Social History:  Sleep: Fair  Appetite:  Fair  Current Medications: Current Facility-Administered Medications  Medication Dose Route Frequency Provider Last Rate Last Dose  . acetaminophen (TYLENOL) tablet 650 mg  650 mg Oral Q6H PRN Charm Rings, NP   650 mg at 10/04/15 2111  . alum & mag hydroxide-simeth (MAALOX/MYLANTA) 200-200-20 MG/5ML suspension 30 mL  30 mL Oral Q4H PRN Charm Rings, NP      . benztropine (COGENTIN) tablet 0.5 mg  0.5 mg Oral BID WC Jomarie Longs, MD   0.5 mg at 10/05/15 0746  .  docusate sodium (COLACE) capsule 100 mg  100 mg Oral BID Jomarie Longs, MD      . doxepin (SINEQUAN) capsule 50 mg  50 mg Oral QHS Sudais Banghart, MD      . gabapentin (NEURONTIN) capsule 200 mg  200 mg Oral TID Jomarie Longs, MD      . loperamide (IMODIUM) capsule 2 mg  2 mg Oral PRN Thermon Leyland, NP   2 mg at 10/05/15 0749  . magnesium hydroxide (MILK OF MAGNESIA) suspension 30 mL  30 mL Oral Daily PRN Charm Rings, NP      . metFORMIN (GLUCOPHAGE) tablet 500 mg  500 mg Oral Q breakfast Jomarie Longs, MD   500 mg at 10/05/15 0746  . multivitamin with minerals tablet 1 tablet  1 tablet Oral Daily Charm Rings, NP   1 tablet at 10/05/15 0746  . OLANZapine (ZYPREXA)  tablet 20 mg  20 mg Oral QHS Ramona Slinger, MD      . OLANZapine (ZYPREXA) tablet 5 mg  5 mg Oral [id number] Jomarie Longs, MD   5 mg at 10/05/15 5409  . risperiDONE (RISPERDAL M-TABS) disintegrating tablet 2 mg  2 mg Oral Q8H PRN Jomarie Longs, MD   2 mg at 10/04/15 1521   And  . ziprasidone (GEODON) injection 20 mg  20 mg Intramuscular PRN Jomarie Longs, MD      . venlafaxine XR (EFFEXOR-XR) 24 hr capsule 150 mg  150 mg Oral Q breakfast Shuna Tabor, MD   150 mg at 10/05/15 0749    Lab Results:  No results found for this or any previous visit (from the past 48 hour(s)).  Physical Findings: AIMS: Facial and Oral Movements Muscles of Facial Expression: None, normal Lips and Perioral Area: None, normal Jaw: None, normal Tongue: None, normal,Extremity Movements Upper (arms, wrists, hands, fingers): None, normal Lower (legs, knees, ankles, toes): None, normal, Trunk Movements Neck, shoulders, hips: None, normal, Overall Severity Severity of abnormal movements (highest score from questions above): None, normal Incapacitation due to abnormal movements: None, normal Patient's awareness of abnormal movements (rate only patient's report): No Awareness, Dental Status Current problems with teeth and/or dentures?: No Does patient usually wear dentures?: No  CIWA:  CIWA-Ar Total: 1 COWS:  COWS Total Score: 2  Musculoskeletal: Strength & Muscle Tone: within normal limits Gait & Station: normal Patient leans: N/A  Psychiatric Specialty Exam: Review of Systems  Psychiatric/Behavioral: Positive for depression, suicidal ideas, hallucinations and substance abuse. The patient is nervous/anxious.   All other systems reviewed and are negative.   Blood pressure 99/58, pulse 98, temperature 98.3 F (36.8 C), temperature source Oral, resp. rate 18, height  (1.727 m), weight 174.181 kg (384 lb), SpO2 97 %.Body mass index is 58.4 kg/(m^2).  General Appearance: Casual  Eye Contact::  Fair   Speech:  Clear and Coherent  Volume:  Normal  Mood:  Anxious and Depressed  Affect:  Depressed  Thought Process:  Coherent  Orientation:  Full (Time, Place, and Person)  Thought Content:  Hallucinations: Auditory Command:  kill self and other people , Paranoid Ideation and Rumination   Suicidal Thoughts:  Yes.  without intent/plan  Homicidal Thoughts:  Yes.  without intent/plan people in general  Memory:  Immediate;   Fair Recent;   Fair Remote;   Fair  Judgement:  Impaired  Insight:  Shallow  Psychomotor Activity:  Decreased  Concentration:  Poor  Recall:  [employer] of Knowledge:Fair  Language: Fair  Akathisia:  No  Handed:  Right  AIMS (if indicated):     Assets:  Others:  access to healthcare  ADL's:  Intact  Cognition: WNL  Sleep:  Number of Hours: 6.75   Treatment Plan Summary:Bruce [name] is a 40 year old African-American male,who has a hx of alcohol abuse, schizophrenia, who presented to [facility] with worsening psychosis and SI. Pt today continues to be depressed ,suicidal and has command AH as well as HI. Will continue treatment.  Daily contact with patient to assess and evaluate symptoms and progress in treatment and Medication management  Reviewed past medical records,treatment plan.   Geodon tapered off.Will titrate Zyprexa higher - 5 mg po bid and 20 mg po qhs for psychosis. Discussed with pt about weightgain, metabolic side effects.Will consider adding Abilify to Zyprexa tomorrow if he continues to have no response to monotherapy.He states he had good effect to Abilify in the past and is requesting it. He was tried on on a smaller dose of Abilify last week , but he reported no effect to the weekend provider who took him off of it . Added Metformin 500 mg po daily for weight gain 2/2 antipsychotic therapy. Increase Effexor XR to 225 mg po daily for affective sx. Will start the higher dose tomorrow. Will continue Cogentin 0.5 mg po bid for EPS. Will increase Doxepin to 50 mg  po qhs for sleep tonight. Will make available PRN medications as per agitation protocol. Will increase Gabapentin to 200 mg po tid for anxiety sx. Will continue to monitor vitals ,medication compliance and treatment side effects while patient is here.  Will monitor for medical issues as well as call consult as needed.  Reviewed labs ,PL elevated - will monitor on an out patient basis. Recreational therapist consult.  CSW will start working on disposition.  Patient to participate in therapeutic milieu .     Bruce Cabiness MD 10/05/2015, 10:52 AM

## 2015-10-05 NOTE — Progress Notes (Signed)
Adult Psychoeducational Group Note  Date:  10/05/2015 Time:  8:45 PM  Group Topic/Focus:  Wrap-Up Group:   The focus of this group is to help patients review their daily goal of treatment and discuss progress on daily workbooks.  Participation Level:  Did Not Attend  Additional Comments:  Pt was in room sleeping at the time of group.  Caswell Corwin 10/05/2015, 8:45 PM

## 2015-10-05 NOTE — Progress Notes (Signed)
D) Pt has been attending the program and participates. Affect is flat and mood depressed. Sits in the dayroom, alone and interacts with his peers infrequently. Continues to have voices that tell him to kill himself and others. Was able to come to this writer and ask for extra medications today because "the voices are getting louder "Pt rates his depression at a 7, hopelessness at a 7 and anxiety at a 5. Admits to thoughts of SI and HI. A) Given support, reassurance and praise. Responding to Pt's needs immediately. Provided with a 1:1 Assuring Pt that if he needs staff they will respond.  R) Pt's affect is flat and Pt tends to sit alone.

## 2015-10-05 NOTE — [facility] Group Notes (Signed)
[facility] Group Notes:  (Clinical Social Work)  10/05/2015  [facility] Group Notes:  (Clinical Social Work)  10/05/2015  11:00AM-12:00PM  Summary of Progress/Problems:  The main focus of today's process group was to listen to a variety of genres of music and to identify that different types of music provoke different responses.  The patient then was able to identify personally what was soothing for them, as well as energizing.  The patient expressed understanding of concepts, as well as knowledge of how each type of music affected him and how this can be used at home as a wellness/recovery tool.  He stated at the beginning of group that he felt calm, and at the end of group said he felt "better."  His affect was flat but he was alert throughout group.  Type of Therapy:  Music Therapy   Participation Level:  Active  Participation Quality:  Attentive   Affect:  Flat  Cognitive:  Oriented  Insight:  Engaged  Engagement in Therapy:  Engaged  Modes of Intervention:   Activity, Exploration  [name], LCSW 10/05/2015

## 2015-10-05 NOTE — Plan of Care (Signed)
Problem: Diagnosis: Increased Risk For Suicide Attempt Goal: LTG-Patient Will Report Improved Mood and Deny Suicidal LTG (by discharge) Patient will report improved mood and deny suicidal ideation.  Outcome: Not Progressing Patient report auditory hallucinations telling him to harm himself and others but verbally contracts for safety.

## 2015-10-06 MED ORDER — MENTHOL 3 MG MT LOZG
1.0000 | LOZENGE | OROMUCOSAL | Status: DC | PRN
  Administered 2015-10-06 – 2015-10-07 (×2): 3 mg via ORAL

## 2015-10-06 MED ORDER — ARIPIPRAZOLE 5 MG PO TABS
5.0000 mg | ORAL_TABLET | Freq: Every day | ORAL | Status: DC
  Administered 2015-10-06 – 2015-10-07 (×2): 5 mg via ORAL
  Filled 2015-10-06 (×5): qty 1

## 2015-10-06 NOTE — Plan of Care (Signed)
Problem: Alteration in thought process Goal: STG-Patient is able to discuss thoughts with staff Outcome: Progressing Pt endorses command hallucination to hurt himself. Pt contract to come to staff before acting on harmful behavior

## 2015-10-06 NOTE — Progress Notes (Signed)
DAR Note: Bruce [name] has been up in the day room much of the day.  He continues to voice suicidal ideation and voices that are telling him to harm himself.  He is able to contract for safety on the unit.  He takes his medications without difficulty.  He denies any pain or discomfort and appears to be in no physical distress.  He went to the cafeteria for lunch after declining to go at breakfast.  He completed his self inventory and reports that his depression and anxiety are 6/10 and his hopelessness is 7/10.  He doesn't have a goal for today.  He was visited by [facility] staff to assist him in filing for medicaid.  He was able to meet with her without difficulty.  Encouraged continued participation in group and unit activities.  Q 15 minute checks maintained for safety.  We will continue to monitor the progress towards his goals.

## 2015-10-06 NOTE — Progress Notes (Signed)
Patient ID: Bruce [name], male   DOB: Oct 15, 1975, 40 y.o.   MRN: [id number] D: Patient in room sleeping on approach. Pt mood and affect appeared depressed and flat. Pt endorses AVH with command to hurt self. Pt denies SI/HI and pain. Cooperative with assessment. No acute physical distress noted.  A: Met with pt 1:1. Medications administered as prescribed. Support and encouragement provided to attend groups and engage in milieu. Pt encouraged to discuss feelings and come to staff with any question or concerns.  R: Patient remains safe and complaint with medications.

## 2015-10-06 NOTE — Plan of Care (Signed)
Problem: Alteration in mood Goal: LTG-Patient reports reduction in suicidal thoughts (Patient reports reduction in suicidal thoughts and is able to verbalize a safety plan for whenever patient is feeling suicidal)  Outcome: Not Progressing Patient has not reported reduction in suicidal thoughts to harm self and others but did contract for safety.

## 2015-10-06 NOTE — Progress Notes (Signed)
Patient has been in his room asleep at beginning of shift and had to be woke up by writer so that he would be able to rest tonight. He took his scheduled medications and sat in the dayroom and watched tv for a little bit and returned to his room to rest. He reports that he still hears voices telling him to hurt self and others. He verbally contracts for safety. Patient remains safe with 15 min checks.

## 2015-10-06 NOTE — [facility] Group Notes (Signed)
[facility] LCSW Group Therapy  10/06/2015 1:15 pm  Type of Therapy: Process Group Therapy  Participation Level:  Active  Participation Quality:  Appropriate  Affect:  Flat  Cognitive:  Oriented  Insight:  Improving  Engagement in Group:  Limited  Engagement in Therapy:  Limited  Modes of Intervention:  Activity, Clarification, Education, Problem-solving and Support  Summary of Progress/Problems: Today's group addressed the issue of overcoming obstacles.  Patients were asked to identify their biggest obstacle post d/c that stands in the way of their on-going success, and then problem solve as to how to manage this. Stayed the entire time-engaged throughout.  Stated that his biggest obstacle is "finding medication that works, and taking it when I am supposed to because I forget."  When clarifying, stated that he has been on the right meds in the past when he was discharged, and that one of them was an injection.  But could not identify what caused him to miss doses.  But it's no surprise his them was medication related as that has been his theme the whole time he has been here-"the meds aren't working."  Bruce [name] 10/06/2015   2:49 PM

## 2015-10-06 NOTE — [facility] Group Notes (Signed)
[facility] LCSW Aftercare Discharge Planning Group Note   10/06/2015 9:45 AM  Participation Quality:    Mood/Affect:    Depression Rating:    Anxiety Rating:    Thoughts of Suicide:   Will you contract for safety?     Current AVH:    Plan for Discharge/Comments:  "I'm doing bad"  Transportation Means:   Supports:  [place], [place]

## 2015-10-06 NOTE — Progress Notes (Signed)
Patient ID: Bruce [name], male   DOB: 05-18-1976, 40 y.o.   MRN: [id number] [facility] MD Progress Note  10/06/2015 3:17 PM Bruce [name]  MRN:  [id number]  Subjective: Bruce [name] reports, "I'm still hearing voices real bad, still feeling like I'm gonna hurt myself. I just want to know if I can get a shot like I did last year. It helped me. The voices calmed down after I took it"' Will re-initiate Abilify 5 mg for mood control.   Objective: Bruce [name] is a 40 year old African-American male. Admitted to [facility] from the [facility] observation unit with complaints of auditory hallucinations telling him to kill himself & others.  Patient seen and chart reviewed.  Patient reports that he continues to hear voices & is having suicidal ideation. Is able to contract for safety. He did not think his medications are helping him. He has asked if he can be on Abilify shots like he did last year that helped him. He states that he turned & tossed last night, and is tired today as a result. The documentation on record indicated that patient slept for 6 hours last night. He is seen in the day room with the other patients.  Per staff - pt is seen as more interactive on the unit than before, seen as attending groups as well as is compliant on medications. Will continue to encourage and support.  Principal Problem: Schizophrenia (HCC)  Diagnosis:   Patient Active Problem List   Diagnosis Date Noted  . Paranoid schizophrenia (HCC) [F20.0]   . 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 Alcohol use disorder, moderate, dependence (HCC) [F10.20] 09/22/2015  . Morbid obesity (HCC) [E66.01] 09/22/2015  . Schizophrenia (HCC) [F20.9] 09/21/2015   Total Time spent with patient: 15 minutes  Past Psychiatric History: Please see H&P  Past Medical History:  Past Medical History  Diagnosis Date  . Depression   . Schizophrenia (HCC)   . Obesity    Family History: Denies hx of HTN, DM, cardiac disease, thyroid disease in family.  Family  History  Problem Relation Age of Onset  . Schizophrenia Mother    Family Psychiatric  History: Mother has schizophrenia. Pt denies substance abuse in family.  Social History: Pt is single, is currently homeless, denies legal issues. History  Alcohol Use  . 16.8 oz/week  . 28 Cans of beer per week     History  Drug Use No    Social History   Social History  . Marital Status: Single    Spouse Name: N/A  . Number of Children: N/A  . Years of Education: N/A   Social History Main Topics  . Smoking status: Never Smoker   . Smokeless tobacco: None  . Alcohol Use: 16.8 oz/week    28 Cans of beer per week  . Drug Use: No  . Sexual Activity: Not Currently   Other Topics Concern  . None   Social History Narrative   Additional Social History:  Sleep: Fair (6 hours per documentation).  Appetite:  Fair  Current Medications: Current Facility-Administered Medications  Medication Dose Route Frequency Provider Last Rate Last Dose  . acetaminophen (TYLENOL) tablet 650 mg  650 mg Oral Q6H PRN Charm Rings, NP   650 mg at 10/04/15 2111  . alum & mag hydroxide-simeth (MAALOX/MYLANTA) 200-200-20 MG/5ML suspension 30 mL  30 mL Oral Q4H PRN Charm Rings, NP      . benztropine (COGENTIN) tablet 0.5 mg  0.5 mg Oral BID WC Jomarie Longs, MD  0.5 mg at 10/06/15 0828  . docusate sodium (COLACE) capsule 100 mg  100 mg Oral BID Jomarie Longs, MD   100 mg at 10/05/15 1710  . doxepin (SINEQUAN) capsule 50 mg  50 mg Oral QHS Jomarie Longs, MD   50 mg at 10/05/15 2128  . gabapentin (NEURONTIN) capsule 200 mg  200 mg Oral TID Jomarie Longs, MD   200 mg at 10/06/15 1207  . loperamide (IMODIUM) capsule 2 mg  2 mg Oral PRN Thermon Leyland, NP   2 mg at 10/06/15 2130  . magnesium hydroxide (MILK OF MAGNESIA) suspension 30 mL  30 mL Oral Daily PRN Charm Rings, NP      . metFORMIN (GLUCOPHAGE) tablet 500 mg  500 mg Oral Q breakfast Jomarie Longs, MD   500 mg at 10/06/15 0828  . multivitamin with  minerals tablet 1 tablet  1 tablet Oral Daily Charm Rings, NP   1 tablet at 10/06/15 [phone]  . OLANZapine (ZYPREXA) tablet 20 mg  20 mg Oral QHS Jomarie Longs, MD   20 mg at 10/05/15 2130  . OLANZapine (ZYPREXA) tablet 5 mg  5 mg Oral [id number] Jomarie Longs, MD   5 mg at 10/06/15 1207  . risperiDONE (RISPERDAL M-TABS) disintegrating tablet 2 mg  2 mg Oral Q8H PRN Jomarie Longs, MD   2 mg at 10/06/15 1501   And  . ziprasidone (GEODON) injection 20 mg  20 mg Intramuscular PRN Jomarie Longs, MD      . venlafaxine XR (EFFEXOR-XR) 24 hr capsule 225 mg  225 mg Oral Q breakfast Saramma Eappen, MD   225 mg at 10/06/15 2841   Lab Results:  No results found for this or any previous visit (from the past 48 hour(s)).  Physical Findings: AIMS: Facial and Oral Movements Muscles of Facial Expression: None, normal Lips and Perioral Area: None, normal Jaw: None, normal Tongue: None, normal,Extremity Movements Upper (arms, wrists, hands, fingers): None, normal Lower (legs, knees, ankles, toes): None, normal, Trunk Movements Neck, shoulders, hips: None, normal, Overall Severity Severity of abnormal movements (highest score from questions above): None, normal Incapacitation due to abnormal movements: None, normal Patient's awareness of abnormal movements (rate only patient's report): No Awareness, Dental Status Current problems with teeth and/or dentures?: No Does patient usually wear dentures?: No  CIWA:  CIWA-Ar Total: 1 COWS:  COWS Total Score: 2  Musculoskeletal: Strength & Muscle Tone: within normal limits Gait & Station: normal Patient leans: N/A  Psychiatric Specialty Exam: Review of Systems  Psychiatric/Behavioral: Positive for depression, suicidal ideas, hallucinations and substance abuse. The patient is nervous/anxious.   All other systems reviewed and are negative.   Blood pressure 93/49, pulse 95, temperature 98.6 F (37 C), temperature source Oral, resp. rate 18, height   (1.727 m), weight 174.181 kg (384 lb), SpO2 97 %.Body mass index is 58.4 kg/(m^2).  General Appearance: Casual  Eye Contact::  Fair  Speech:  Clear and Coherent  Volume:  Normal  Mood:  Anxious and Depressed  Affect:  Depressed  Thought Process:  Coherent  Orientation:  Full (Time, Place, and Person)  Thought Content:  Hallucinations: Auditory Command:  kill self and other people , Paranoid Ideation and Rumination   Suicidal Thoughts:  Yes.  without intent/plan  Homicidal Thoughts:  Yes.  without intent/plan, people in general  Memory:  Immediate;   Fair Recent;   Fair Remote;   Fair  Judgement:  Impaired  Insight:  Shallow  Psychomotor Activity:  Decreased  Concentration:  Poor  Recall:  [employer] of Knowledge:Fair  Language: Fair  Akathisia:  No  Handed:  Right  AIMS (if indicated):     Assets:  Others:  access to healthcare  ADL's:  Intact  Cognition: WNL  Sleep:  Number of Hours: 6   Treatment Plan Summary: Bruce [name] is a 40 year old African-American male,who has a hx of alcohol abuse, schizophrenia, who presented to [facility] with worsening psychosis and SI. Pt today continues to be depressed, suicidal and has command AH as well as HI. Will continue treatment.  Daily contact with patient to assess and evaluate symptoms and progress in treatment and Medication management  Reviewed past medical records,treatment plan.  Geodon tapered off.Will titrate Zyprexa higher - 5 mg po bid and 20 mg po qhs for psychosis. Discussed with pt about weightgain, metabolic side, will add Abilify to Zyprexa. Initiate Abilify 5 mg daily for mood control  Continue Metformin 500 mg po daily for weight gain 2/2 antipsychotic therapy. Continue Effexor XR to 225 mg po daily for affective symptoms. Will continue Cogentin 0.5 mg po bid for EPS. Will increase Doxepin to 50 mg po qhs for sleep tonight. Will make available PRN medications as per agitation protocol. Will increase Gabapentin to 200 mg po tid  for anxiety symptoms. Will continue to monitor vitals, medication compliance and treatment side effects while patient is here.  Will monitor for medical issues as well as call consult as needed.  Reviewed labs ,PL elevated - will monitor on an out patient basis. Recreational therapist consult.  CSW will start working on disposition.  Patient to participate in therapeutic milieu .   Sanjuana Kava, PMHNP, FNP-BC 10/06/2015, 3:17 PM   Agree with NP Progress Note, as above Nehemiah Massed, MD

## 2015-10-07 MED ORDER — ARIPIPRAZOLE ER 400 MG IM SUSR
400.0000 mg | INTRAMUSCULAR | Status: DC
  Administered 2015-10-08: 400 mg via INTRAMUSCULAR

## 2015-10-07 MED ORDER — ARIPIPRAZOLE 10 MG PO TABS
10.0000 mg | ORAL_TABLET | Freq: Every day | ORAL | Status: DC
  Administered 2015-10-08: 10 mg via ORAL
  Filled 2015-10-07 (×3): qty 1

## 2015-10-07 MED ORDER — ARIPIPRAZOLE 5 MG PO TABS
5.0000 mg | ORAL_TABLET | Freq: Every evening | ORAL | Status: DC
  Administered 2015-10-07: 5 mg via ORAL
  Filled 2015-10-07 (×3): qty 1

## 2015-10-07 NOTE — Progress Notes (Signed)
Patient ID: Bruce [name], male   DOB: [birth year], 40 y.o.   MRN: [id number] D: Patient in room awake on approach. Pt later went asked for remote and went into the dayroom to watch TV. Pt reports having AVH with command to hurt self but does not appear to be responding to internal stimulation. Pt does contracts to come to staff before acting on harmful thoughts. Cooperative with assessment. No acute physical distress noted.  A: Met with pt 1:1. Medications administered as prescribed. Support and encouragement provided to attend groups and engage in milieu. Pt encouraged to discuss feelings and come to staff with any question or concerns.  R: Patient remains safe and complaint with medications.

## 2015-10-07 NOTE — Progress Notes (Signed)
Patient ID: Bruce [name], male   DOB: Jun 17, 1976, 40 y.o.   MRN: [id number] [facility] MD Progress Note  10/07/2015 12:40 PM Bruce [name]  MRN:  [id number]  Subjective: Bruce [name] reports, "I'm still hearing voices, but they are getting better. I am sure I will be fine by Monday. I will not be suicidal or psychotic by Monday "      Objective: Bruce [name] is a 40 year old African-American male. Admitted to [facility] from the [facility] observation unit with complaints of auditory hallucinations telling him to kill himself & others.  Patient seen and chart reviewed.   Patient continues to report AH , but states he knows they are getting better. Pt continues to report passive SI , but states today that he will not be suicidal by Monday. Pt has had several medication changes since admission - pt always however states he is symptomatic and that he is suicidal.Pt has presented with extreme symptomatology since admission , although multiple medication changes were done. Pt on the unit is observed as not responding to internal stimuli , is not observed as though he is in distress from his AH/VH . Pt per staff last night was observed as sleeping well and when staff woke him up for his medications was observed as stating " Oh I am hearing stuff , I cannot sleep.' Pt is attending groups , seen as taking care of his ADLS , his appetite is good , he is observed as sleeping without any distress , eventhough he always rates his depression as high.  Hence it appears that there is significant discrepancies between the subjective presentation and objective sx . And also his report this AM about him wanting to stay on the unit until Monday ,when he states all his sx will disappear also seems to indicate a secondary gain . Hence will observe pt today and will work on discharging patient with aftercare referrals . CSW will work on the same.        Principal Problem: Schizophrenia (HCC)  Diagnosis:   Patient Active Problem List    Diagnosis Date Noted  . Paranoid schizophrenia (HCC) [F20.0]   . 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 Alcohol use disorder, moderate, dependence (HCC) [F10.20] 09/22/2015  . Morbid obesity (HCC) [E66.01] 09/22/2015  . Schizophrenia (HCC) [F20.9] 09/21/2015   Total Time spent with patient: 15 minutes  Past Psychiatric History: Please see H&P  Past Medical History:  Past Medical History  Diagnosis Date  . Depression   . Schizophrenia (HCC)   . Obesity    Family History: Denies hx of HTN, DM, cardiac disease, thyroid disease in family.  Family History  Problem Relation Age of Onset  . Schizophrenia Mother    Family Psychiatric  History: Mother has schizophrenia. Pt denies substance abuse in family.  Social History: Pt is single, is currently homeless, denies legal issues. History  Alcohol Use  . 16.8 oz/week  . 28 Cans of beer per week     History  Drug Use No    Social History   Social History  . Marital Status: Single    Spouse Name: N/A  . Number of Children: N/A  . Years of Education: N/A   Social History Main Topics  . Smoking status: Never Smoker   . Smokeless tobacco: None  . Alcohol Use: 16.8 oz/week    28 Cans of beer per week  . Drug Use: No  . Sexual Activity: Not Currently   Other Topics Concern  . None  Social History Narrative   Additional Social History:  Sleep: Fair (6 hours per documentation).  Appetite:  Fair  Current Medications: Current Facility-Administered Medications  Medication Dose Route Frequency Provider Last Rate Last Dose  . acetaminophen (TYLENOL) tablet 650 mg  650 mg Oral Q6H PRN Bruce Rings, NP   650 mg at 10/04/15 2111  . alum & mag hydroxide-simeth (MAALOX/MYLANTA) 200-200-20 MG/5ML suspension 30 mL  30 mL Oral Q4H PRN Bruce Rings, NP      . Melene Muller ON 10/08/2015] ARIPiprazole (ABILIFY) tablet 10 mg  10 mg Oral Daily Rada Zegers, MD      . ARIPiprazole (ABILIFY) tablet 5 mg  5 mg Oral QPM Slyvia Lartigue, MD      . benztropine (COGENTIN) tablet 0.5 mg  0.5 mg Oral BID WC Ilo Beamon, MD   0.5 mg at 10/07/15 0830  . docusate sodium (COLACE) capsule 100 mg  100 mg Oral BID Jomarie Longs, MD   100 mg at 10/07/15 0830  . doxepin (SINEQUAN) capsule 50 mg  50 mg Oral QHS Jomarie Longs, MD   50 mg at 10/06/15 2120  . gabapentin (NEURONTIN) capsule 200 mg  200 mg Oral TID Jomarie Longs, MD   200 mg at 10/07/15 1204  . loperamide (IMODIUM) capsule 2 mg  2 mg Oral PRN Thermon Leyland, NP   2 mg at 10/06/15 1308  . magnesium hydroxide (MILK OF MAGNESIA) suspension 30 mL  30 mL Oral Daily PRN Bruce Rings, NP      . menthol-cetylpyridinium (CEPACOL) lozenge 3 mg  1 lozenge Oral PRN Oneta Rack, NP   3 mg at 10/07/15 1003  . metFORMIN (GLUCOPHAGE) tablet 500 mg  500 mg Oral Q breakfast Kamonte Mcmichen, MD   500 mg at 10/07/15 0830  . multivitamin with minerals tablet 1 tablet  1 tablet Oral Daily Bruce Rings, NP   1 tablet at 10/07/15 0830  . OLANZapine (ZYPREXA) tablet 20 mg  20 mg Oral QHS Jomarie Longs, MD   20 mg at 10/06/15 2120  . OLANZapine (ZYPREXA) tablet 5 mg  5 mg Oral [id number] Jomarie Longs, MD   5 mg at 10/07/15 1204  . risperiDONE (RISPERDAL M-TABS) disintegrating tablet 2 mg  2 mg Oral Q8H PRN Jomarie Longs, MD   2 mg at 10/06/15 1501   And  . ziprasidone (GEODON) injection 20 mg  20 mg Intramuscular PRN Jomarie Longs, MD      . venlafaxine XR (EFFEXOR-XR) 24 hr capsule 225 mg  225 mg Oral Q breakfast Kayleb Warshaw, MD   225 mg at 10/07/15 2952   Lab Results:  No results found for this or any previous visit (from the past 48 hour(s)).  Physical Findings: AIMS: Facial and Oral Movements Muscles of Facial Expression: None, normal Lips and Perioral Area: None, normal Jaw: None, normal Tongue: None, normal,Extremity Movements Upper (arms, wrists, hands, fingers): None, normal Lower (legs, knees, ankles, toes): None, normal, Trunk Movements Neck, shoulders, hips:  None, normal, Overall Severity Severity of abnormal movements (highest score from questions above): None, normal Incapacitation due to abnormal movements: None, normal Patient's awareness of abnormal movements (rate only patient's report): No Awareness, Dental Status Current problems with teeth and/or dentures?: No Does patient usually wear dentures?: No  CIWA:  CIWA-Ar Total: 1 COWS:  COWS Total Score: 2  Musculoskeletal: Strength & Muscle Tone: within normal limits Gait & Station: normal Patient leans: N/A  Psychiatric Specialty Exam: Review of Systems  Psychiatric/Behavioral: Positive for depression, suicidal ideas, hallucinations and substance abuse. The patient is nervous/anxious.   All other systems reviewed and are negative.   Blood pressure 122/80, pulse 94, temperature 98.6 F (37 C), temperature source Oral, resp. rate 18, height 5\' 8"  (1.727 m), weight 174.181 kg (384 lb), SpO2 97 %.Body mass index is 58.4 kg/(m^2).  General Appearance: Casual  Eye Contact::  Fair  Speech:  Clear and Coherent  Volume:  Normal  Mood:  Anxious and Depressed  Affect:  Appropriate does not seem to be in distress from anxiety , does not appear as depressed as her rates his depression  Thought Process:  Coherent  Orientation:  Full (Time, Place, and Person)  Thought Content:  Hallucinations: Auditory Command:  kill self and other people , Paranoid Ideation and Rumination however is not seen as responding to internal stimuli, is seen on the unit as attending groups , interactive with staff and peers.  Suicidal Thoughts:  Yes.  without intent/plan  Homicidal Thoughts:  No  Memory:  Immediate;   Fair Recent;   Fair Remote;   Fair  Judgement:  Impaired  Insight:  Shallow  Psychomotor Activity:  Normal  Concentration:  Fair  Recall:  [employer] of Knowledge:Fair  Language: Fair  Akathisia:  No  Handed:  Right  AIMS (if indicated):     Assets:  Others:  access to healthcare  ADL's:   Intact  Cognition: WNL  Sleep:  Number of Hours: 6.75   Treatment Plan Summary: Dohn is a 40 year old African-American male,who has a hx of alcohol abuse, schizophrenia, who presented to [facility] with worsening psychosis and SI. Pt today continues to report depression, psychosis and passive SI.   There is significant discrepancies between the subjective presentation and objective sx . And also his report this AM about him wanting to stay on the unit until Monday ,when he states all his sx will disappear also seems to indicate a secondary gain . Hence will observe pt today and will work on discharging patient with aftercare referrals . CSW will work on the same.   Daily contact with patient to assess and evaluate symptoms and progress in treatment and Medication management  Reviewed past medical records,treatment plan.  Will continue Zyprexa 5 mg po bid and 20 mg po qhs for psychosis.  Increase  Abilify to 10 mg po bid for mood control , as per pt request. Will provide Abilify Maintena IM 400 mg prior to DC. Pt reports good effect in the past. Continue Metformin 500 mg po daily for weight gain 2/2 antipsychotic therapy. Continue Effexor XR to 225 mg po daily for affective symptoms. Will continue Cogentin 0.5 mg po bid for EPS. Will continue Doxepin 50 mg po qhs for sleep tonight. Will make available PRN medications as per agitation protocol. Will continue Gabapentin  200 mg po tid for anxiety symptoms. Will continue to monitor vitals, medication compliance and treatment side effects while patient is here.  Will monitor for medical issues as well as call consult as needed.  Reviewed labs ,PL elevated - will monitor on an out patient basis. Recreational therapist consult.  CSW will start working on disposition.  Patient to participate in therapeutic milieu .   Shamari Lofquist MD 10/07/2015, 12:40 PM

## 2015-10-07 NOTE — Progress Notes (Signed)
Pt has been noted sitting in the day room all day.  Laughing with staff at times.  He has been requesting snacks and watching TV.  He ate two trays at lunch/supper and went back for thirds for dessert at lunch.  He was redirected when he asked for another dessert at snack time this afternoon.  He does not appear to be responding to internal stimuli.  We will continue to monitor the progress towards his goals.

## 2015-10-07 NOTE — [facility] Group Notes (Signed)
[facility] LCSW Group Therapy  10/07/2015 , 12:48 PM   Type of Therapy:  Group Therapy  Participation Level:  Active  Participation Quality:  Attentive  Affect:  Appropriate  Cognitive:  Alert  Insight:  Improving  Engagement in Therapy:  Engaged  Modes of Intervention:  Discussion, Exploration and Socialization  Summary of Progress/Problems: Today's group focused on the term Diagnosis.  Participants were asked to define the term, and then pronounce whether it is a negative, positive or neutral term.  Was out talking to someone from the [facility] until the last 10 minutes of the group.  Declined to comment.  Daryel Gerald B 10/07/2015 , 12:48 PM

## 2015-10-07 NOTE — Progress Notes (Signed)
DAR Note: Bruce [name] has been up and visible on the unit.  He continues to report that he is having suicidal thoughts but is able to contract for safety.  He reports hearing voices but has been noted sitting in the day room quietly and does not appear to be responding to internal stimuli.  He attends groups with minimal interaction. He gets up for meals and takes medications without difficulty.  He denies any pain or discomfort and appears to be in no physical distress.  He completed his self inventory and reports that is depression is 7/10, hopelessness 8/10 and anxiety 6/10.  He states that his goal for today is to "get on the right medications."  Encouraged continued participation in group and unit activities.  Q 15 minute checks maintained for safety.  We will continue to monitor the progress towards his goals.

## 2015-10-07 NOTE — [facility] Group Notes (Signed)
[facility] Group Notes:  (Nursing/MHT/Case Management/Adjunct)  Date:  10/07/2015  Time:  9:30am  Type of Therapy:  Nurse Education  Participation Level:  Minimal  Participation Quality:  Attentive  Affect:  Blunted  Cognitive:  Alert and Lacking  Insight:  Limited  Engagement in Group:  Developing/Improving  Modes of Intervention:  Discussion and Education  Summary of Progress/Problems:  Group topic was Recovery.  Discussed what recovery means to group members and the importance of goal setting.  Discussed sleep hygiene.  He was attentive and answered one question during group time.  Norm Parcel Xiara Knisley 10/07/2015, 10:27 AM

## 2015-10-07 NOTE — Plan of Care (Signed)
Problem: Alteration in mood Goal: LTG-Patient reports reduction in suicidal thoughts (Patient reports reduction in suicidal thoughts and is able to verbalize a safety plan for whenever patient is feeling suicidal)  Outcome: Not Progressing He continues to report that he is having suicidal thoughts and is able to contract for safety on the unit.

## 2015-10-08 MED ORDER — GABAPENTIN 100 MG PO CAPS: 200.0000 mg | ORAL_CAPSULE | Freq: Three times a day (TID) | ORAL | Status: DC

## 2015-10-08 MED ORDER — ARIPIPRAZOLE 5 MG PO TABS: 5.0000 mg | ORAL_TABLET | Freq: Every evening | ORAL | Status: DC

## 2015-10-08 MED ORDER — OLANZAPINE 20 MG PO TABS: 20.0000 mg | ORAL_TABLET | Freq: Every day | ORAL | Status: DC

## 2015-10-08 MED ORDER — METFORMIN HCL 500 MG PO TABS: 500.0000 mg | ORAL_TABLET | Freq: Every day | ORAL | Status: DC

## 2015-10-08 MED ORDER — OLANZAPINE 5 MG PO TABS: 5.0000 mg | ORAL_TABLET | ORAL | Status: DC

## 2015-10-08 MED ORDER — DOXEPIN HCL 50 MG PO CAPS: 50.0000 mg | ORAL_CAPSULE | Freq: Every day | ORAL | Status: DC

## 2015-10-08 MED ORDER — ARIPIPRAZOLE 5 MG PO TABS
5.0000 mg | ORAL_TABLET | Freq: Every evening | ORAL | Status: DC
Start: 2015-10-08 — End: 2015-10-09
  Administered 2015-10-08: 5 mg via ORAL
  Filled 2015-10-08: qty 1
  Filled 2015-10-08 (×2): qty 7
  Filled 2015-10-08: qty 1

## 2015-10-08 MED ORDER — VENLAFAXINE HCL ER 75 MG PO CP24: 225.0000 mg | ORAL_CAPSULE | Freq: Every day | ORAL | Status: DC

## 2015-10-08 MED ORDER — ARIPIPRAZOLE ER 400 MG IM SUSR: 400.0000 mg | INTRAMUSCULAR | Status: DC

## 2015-10-08 MED ORDER — BENZTROPINE MESYLATE 0.5 MG PO TABS: 0.5000 mg | ORAL_TABLET | Freq: Two times a day (BID) | ORAL | Status: DC

## 2015-10-08 NOTE — Progress Notes (Signed)
DAR Note: Bruce [name] has been out of his room all morning.  Attending groups.  Minimal interaction with peers.  He has been noted talking, fist bumping and laughing with MHT on the unit.  He denies SI/HI and states that his voices are getting better.  He goes to the cafeteria for meals and eats well.  He completed his self inventory and reports that his depression and hopelessness 6/10 and his anxiety 5/10.  He didn't talk about a goal for today.  He took his ordered Abilify Maintena without difficulty.  No prn's given this shift.  Encouraged continued participation in group and unit activities.  Q 15 minute checks maintained for safety.  We will continue to monitor the progress towards his goals.

## 2015-10-08 NOTE — [facility] Group Notes (Signed)
[facility] LCSW Group Therapy  10/08/2015 1:36 PM  Type of Therapy: Group Therapy  Participation Level: Active  Participation Quality: Attentive  Affect: Flat  Cognitive: Oriented  Insight: Limited  Engagement in Therapy: Engaged  Modes of Intervention: Discussion and Socialization  Summary of Progress/Problems: Bruce [name] from the [employer] was here to tell his story of recovery and play his guitar.  Pt was alert and pleasant for the duration of the group. Pt did not participate but did seem interested in the topic being discussed.  [name]. [name] 10/08/2015 1:36 PM

## 2015-10-08 NOTE — Tx Team (Signed)
Interdisciplinary Treatment Plan Update (Adult)  Date:  10/08/2015  Time Reviewed:  10:10 AM   Progress in Treatment: Attending groups: No.Pt new to unit. Continuing to assess.  Participating in groups:  No. Taking medication as prescribed:  Yes. Tolerating medication:  Yes. Family/Significant othe contact made:  No Patient understands diagnosis:  Yes. and As evidenced by:  seeking treatment for SI, AH, depression, and medication stabilization. Discussing patient identified problems/goals with staff:  Yes. Medical problems stabilized or resolved:  Yes. Denies suicidal/homicidal ideation: Yes. Issues/concerns per patient self-inventory:  Other:  Discharge Plan or Barriers: CSW assessing for appropriate referrals. Pt reports that he has no current providers.   Reason for Continuation of Hospitalization:   Comments:  Bruce [name] is an 40 y.o. male presenting to [facility] voluntarily for suicidal ideations with a plan to get hit by a train. Patient states that he was on the train tracks and started to think about how his suicide would impact his mother and he got off of the track. Patient states that he has auditory command hallucinations that trigger his SI. Patient states that the voices also tell him to kill others but there is not a specific person. Patient states that he has experienced hallucinations since his 73s and feels that the hallucinations have increased recently and he is not sure why. Patient states that he has been prescribed Haldol in the past but stopped taking it because he does not feel like it works. Patient states that he Abilify has worked in the past , however, he cannot afford it once he is released from a facility. Patient states that he last took medications about three days ago when he was in another facility, although he cannot remember the name of the facility. Patient states that he has attempted suicide two tines with the last time being in October of 2016 where he  overdosed on pills at his aunts house. He states that he was admitted to [facility] and realized that the pills were "herbal pills." Patient states that he has been to other facilities since that time, including [facility]. Patient denies outpatient treatment stating that he is unable to afford it. Patient endorses HI but denies identified victim, intent, or plan to hurt someone. Patient denies visual hallucinations. Patient denies use of drugs and alcohol. Patient UDS and BAL both clear at time of assessment.. Diagnosis: Schizophrenia   09/25/15:Continues with depression, SI with command hallucincations, as well as poor sleep.  Has not identified dispositional plan.  Will increase Prozac to 60 mg po daily for affective sx. Will continue Prolixin 5 mg po daily and 10 mg po qhs for psychosis. Will add Abilify po qpm for psychosis- to augment the effect of prolixin as well as to help with elevated PL. Will continue Cogentin 0.5 mg po bid for EPS. Will increase Trazodone to 150 mg po qhs for sleep.  09/30/15: Pt today continues to be depressed ,suicidal, homicidal and has command [place]. Will continue treatment. Will increase Geodon to 80 mg PO twice a day. Discussed medication side effects and benefits. Patient does not want any medication that cause further weight gain.  Taper off Prozac for lack of efficacy. Increase Effexor XR to 75 mg po daily for affective sx. Will continue Cogentin 0.5 mg po bid for EPS. Will Continue Doxepin to 20 mg po qhs for sleep.  10/03/15: Will taper off Geodon for lack of efficacy . Will titrate Zyprexa higher - 5 mg po tid for psychosis. Discussed with  pt about weightgain, metabolic side effects. Added Metformin 500 mg po daily for weight gain 2/2 antipsychotic therapy. Continue Effexor XR 75 mg po daily for affective sx. Will continue Cogentin 0.5 mg po bid for EPS. Will continue Doxepin 20 mg po qhs for sleep.  Estimated length of stay:  Likely d/c tomorrow  New  goal(s): to develop effective aftercare plan.   Additional Comments:  Patient and CSW reviewed pt's identified goals and treatment plan. Patient verbalized understanding and agreed to treatment plan. CSW reviewed [facility] "Discharge Process and Patient Involvement" Form. Pt verbalized understanding of information provided and signed form.    Review of initial/current patient goals per problem list:  1. Goal(s): Patient will participate in aftercare plan  Met: Yes  Target date: at discharge  As evidenced by: Patient will participate within aftercare plan AEB aftercare provider and housing plan at discharge being identified.  1/30: CSW assessing. During last admission, pt went to [facility]. 09/25/15:  Pt called [facility].  Was declined.  States his plan is to go to [facility] shelter, but understands he has to go through [facility], and appears to be despondent over having no safe place to stay.  Will continue to explore options with pt. 09/30/15:  States he has been calling [facility] shelter in [place], but no openings.  Willing to go to [place] if they have an opening 10/03/15:  Met with [name] from [facility]-continues to want to stay in [place] nearer his mother 10/08/15:  Pt plans to go to the [facility] tomorrow first thing to see about getting into a shelter.  He has been working with [name] from the [facility].  His back up plan for housing is the [facility].  He will follow up at [facility] if he stays here in [place].  2. Goal (s): Patient will exhibit decreased depressive symptoms and suicidal ideations.  Met: Yes   Target date: at discharge  As evidenced by: Patient will utilize self rating of depression at 3 or below and demonstrate decreased signs of depression or be deemed stable for discharge by MD.  1/30: Pt rates depression as high. Denies SI/HI.  09/25/15:  Rates depression at an 8.  Endorses SI, and contracts for safety 09/30/15:  No change 10/03/15:  [place] denies SI today, states  his depression is "somewhat" decreased 10/08/15:  Rates his depression a 3 today  3. Goal(s): Patient will demonstrate decreased signs and symptoms of psychosis.   Met:Yes  Target date: at discharge  As evidenced by: Patient will demonstrate decreased signs of paranoia, or be deemed stable for discharge by MD  1/30: Pt continues to report AH telling him to hurt himself and others. He is in the process of being moved to [address] hall due to level of psychosis.  09/25/15: Continues to c/o command hallucinations 09/30/15:  No progress 10/03/15: AH no longer command in nature 10/08/15:  No sign nor symptoms of AH today  Attendees: Patient:   10/08/2015 10:10 AM   Family:   10/08/2015 10:10 AM   Physician:  Ursula Alert  10/08/2015 10:10 AM   Nursing:   Manuella Ghazi RN 10/08/2015 10:10 AM   Clinical Social Worker: Ripley Fraise LCSW 10/08/2015 10:10 AM   Clinical Social Worker: 10/08/2015 10:10 AM   Other:  Gerline Legacy Nurse Case Manager 10/08/2015 10:10 AM   Other:   10/08/2015 10:10 AM   Other:   10/08/2015 10:10 AM   Other:  10/08/2015 10:10 AM   Other:  10/08/2015 10:10 AM   Other:  10/08/2015 10:10 AM    10/08/2015 10:10 AM    10/08/2015 10:10 AM    10/08/2015 10:10 AM    10/08/2015 10:10 AM    Scribe for Treatment Team:   Ripley Fraise  10/08/2015 10:10 AM

## 2015-10-08 NOTE — Plan of Care (Signed)
Problem: Alteration in mood Goal: STG-Patient reports thoughts of self-harm to staff Outcome: Progressing Pt contract to come to staff before acting on harmful thoughts.  Pt endorses command AVH to hurt self

## 2015-10-08 NOTE — Discharge Summary (Signed)
Physician Discharge Summary Note  Patient:  Bruce [name] is an 40 y.o., male MRN:  [id number] DOB:  [birth year] Patient phone:  [phone] (home)  Patient address:   [place] [address],  Total Time spent with patient: 30 minutes  Date of Admission:  09/20/2015 Date of Discharge: 10/09/2015  Reason for Admission:  Hearing voices  Principal Problem: Schizophrenia [facility]) Discharge Diagnoses: Patient Active Problem List   Diagnosis Date Noted  . Paranoid schizophrenia (HCC) [F20.0]   . 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 Alcohol use disorder, moderate, dependence (HCC) [F10.20] 09/22/2015  . Morbid obesity (HCC) [E66.01] 09/22/2015  . Schizophrenia (HCC) [F20.9] 09/21/2015    Past Psychiatric History:  See above noted  Past Medical History:  Past Medical History  Diagnosis Date  . Depression   . Schizophrenia (HCC)   . Obesity    History reviewed. No pertinent past surgical history. Family History:  Family History  Problem Relation Age of Onset  . Schizophrenia Mother    Family Psychiatric  History:  See above noted Social History:  History  Alcohol Use  . 16.8 oz/week  . 28 Cans of beer per week     History  Drug Use No    Social History   Social History  . Marital Status: Single    Spouse Name: N/A  . Number of Children: N/A  . Years of Education: N/A   Social History Main Topics  . Smoking status: Never Smoker   . Smokeless tobacco: None  . Alcohol Use: 16.8 oz/week    28 Cans of beer per week  . Drug Use: No  . Sexual Activity: Not Currently   Other Topics Concern  . None   Social History Narrative    Hospital Course:  Bruce [name] is a 40 year old  Male admitted to [facility] from the [facility] observation unit with complaints of auditory hallucinations telling him to kill himself & others.  Bruce [name] was admitted for Schizophrenia [facility]) and crisis management.  He was treated with the following medications:  Zyprexa 5 mg po bid and 20 mg  po qhs for psychosis, Abilify 5 mg po daily for mood control for 21 days - pt received Abilify Maintena IM 400 mg  ( 10/08/15).  Must be repeated q 28 days, Metformin 500 mg po daily for weight gain 2/2 antipsychotic therapy, Effexor XR to 225 mg po daily for affective symptoms, Cogentin 0.5 mg po bid for EPS, Doxepin 50 mg po qhs for sleep tonight, PRN medications as per agitation protocol, Gabapentin 200 mg po tid for anxiety symptoms.   Bruce [name] was discharged with current medication and was instructed on how to take medications as prescribed; (details listed below under Medication List).  Medical problems were identified and treated as needed.  Home medications were restarted as appropriate.  Daily contact with patient to assess and evaluate symptoms and progress in treatment and Medication management    Improvement was monitored by observation and Bruce [name] daily report of symptom reduction.  Emotional and mental status was monitored by daily self-inventory reports completed by Bruce [name] and clinical staff.   Recreational therapist consult was ordered to help patient develop additional coping skills.  Prolactin elevated and advised patient to have this monitored  on an out patient basis.          Bruce [name] was evaluated by the treatment team for stability and plans for continued recovery upon discharge.  Bruce [name] motivation was an integral factor for  scheduling further treatment.  Employment, transportation, bed availability, health status, family support, and any pending legal issues were also considered during his hospital stay.  He was offered further treatment options upon discharge including but not limited to Residential, Intensive Outpatient, and Outpatient treatment.  Bruce [name] will follow up with the services as listed below under Follow Up Information.  Pt to be discharged to [facility].  Upon completion of this admission the Katlin Ciszewski was both mentally and medically  stable for discharge denying suicidal/homicidal ideation, auditory/visual/tactile hallucinations, delusional thoughts and paranoia.     Physical Findings: AIMS: Facial and Oral Movements Muscles of Facial Expression: None, normal Lips and Perioral Area: None, normal Jaw: None, normal Tongue: None, normal,Extremity Movements Upper (arms, wrists, hands, fingers): None, normal Lower (legs, knees, ankles, toes): None, normal, Trunk Movements Neck, shoulders, hips: None, normal, Overall Severity Severity of abnormal movements (highest score from questions above): None, normal Incapacitation due to abnormal movements: None, normal Patient's awareness of abnormal movements (rate only patient's report): No Awareness, Dental Status Current problems with teeth and/or dentures?: No Does patient usually wear dentures?: No  CIWA:  CIWA-Ar Total: 1 COWS:  COWS Total Score: 2  Musculoskeletal: Strength & Muscle Tone: within normal limits Gait & Station: normal Patient leans: N/A  Psychiatric Specialty Exam:  SEE MD SRA ROS  Blood pressure 110/71, pulse 107, temperature 98.4 F (36.9 C), temperature source Oral, resp. rate 20, height 5\' 8"  (1.727 m), weight 174.181 kg (384 lb), SpO2 97 %.Body mass index is 58.4 kg/(m^2).  Have you used any form of tobacco in the last 30 days? (Cigarettes, Smokeless Tobacco, Cigars, and/or Pipes): No  Has this patient used any form of tobacco in the last 30 days? (Cigarettes, Smokeless Tobacco, Cigars, and/or Pipes) Yes, NA  Blood Alcohol level:  Lab Results  Component Value Date   [facility] <5 09/19/2015   ETH <5 06/30/2015    Metabolic Disorder Labs:  Lab Results  Component Value Date   HGBA1C 5.4 09/26/2015   MPG 108 09/26/2015   MPG 103 06/07/2015   Lab Results  Component Value Date   PROLACTIN 84.1* 09/23/2015   Lab Results  Component Value Date   CHOL 157 09/26/2015   TRIG 72 09/26/2015   HDL 40* 09/26/2015   CHOLHDL 3.9 09/26/2015   VLDL 14  09/26/2015   LDLCALC 103* 09/26/2015   LDLCALC 95 06/07/2015    See Psychiatric Specialty Exam and Suicide Risk Assessment completed by Attending Physician prior to discharge.  Discharge destination:  [facility]  Is patient on multiple antipsychotic therapies at discharge:  No   Has Patient had three or more failed trials of antipsychotic monotherapy by history:  No  Recommended Plan for Multiple Antipsychotic Therapies: NA     Medication List    STOP taking these medications        acetaminophen 500 MG tablet  Commonly known as:  TYLENOL     FLUoxetine 20 MG capsule  Commonly known as:  PROZAC     haloperidol 1 MG tablet  Commonly known as:  HALDOL     haloperidol 5 MG tablet  Commonly known as:  HALDOL     multivitamin with minerals Tabs tablet     traZODone 100 MG tablet  Commonly known as:  DESYREL      TAKE these medications      Indication   ARIPiprazole 5 MG tablet  Commonly known as:  ABILIFY  Take 1 tablet (5 mg total) by mouth  every evening.   Indication:  mood stabilization     ARIPiprazole 400 MG Susr  Inject 400 mg into the muscle every 28 (twenty-eight) days. Inject 400 mg into the muscle every 28 (twenty-eight) days. Last dose 10/08/2015.  Next dose 11/05/2015.   Indication:  Schizophrenia     benztropine 0.5 MG tablet  Commonly known as:  COGENTIN  Take 1 tablet (0.5 mg total) by mouth 2 (two) times daily with a meal.   Indication:  Extrapyramidal Reaction caused by Medications     doxepin 50 MG capsule  Commonly known as:  SINEQUAN  Take 1 capsule (50 mg total) by mouth at bedtime.   Indication:  Depression     gabapentin 100 MG capsule  Commonly known as:  NEURONTIN  Take 2 capsules (200 mg total) by mouth 3 (three) times daily.   Indication:  Aggressive Behavior, Agitation     metFORMIN 500 MG tablet  Commonly known as:  GLUCOPHAGE  Take 1 tablet (500 mg total) by mouth daily with breakfast.   Indication:  Type 2 Diabetes     OLANZapine  20 MG tablet  Commonly known as:  ZYPREXA  Take 1 tablet (20 mg total) by mouth at bedtime.   Indication:  Manic-Depression     OLANZapine 5 MG tablet  Commonly known as:  ZYPREXA  Take 1 tablet (5 mg total) by mouth 2 (two) times daily at 7 am and 12 noon.   Indication:  Manic-Depression     venlafaxine XR 75 MG 24 hr capsule  Commonly known as:  EFFEXOR-XR  Take 3 capsules (225 mg total) by mouth daily with breakfast.   Indication:  Major Depressive Disorder           Follow-up Information    Follow up with [facility].   Specialty:  Behavioral Health   Why:  Go to the walkin clinic Monday through Friday between 8 and 11AM for you hospital follow up appointment.  If you end up in [facility], go to [facility].  You can reach the shelter in [place] at [phone] information:   [address] [place] [address] [phone]       Follow-up recommendations:  Activity:  as tol Diet:  as tol  Comments:  1.  Take all your medications as prescribed.              2.  Report any adverse side effects to outpatient provider.                       3.  Patient instructed to not use alcohol or illegal drugs while on prescription medicines.            4.  In the event of worsening symptoms, instructed patient to call 911, the crisis hotline or go to nearest emergency room for evaluation of symptoms.  Signed: Lindwood Qua, NP [facility] 10/08/2015, 4:31 PM

## 2015-10-08 NOTE — Progress Notes (Signed)
Patient ID: Bruce [name], male   DOB: [birth year], 40 y.o.   MRN: [id number] D: Patient sitting in dayroom not interacting with peers. Pt reports he will be discharging to [facility] tomorrow. Pt denies SI/AVH. Pt contracts to come to staff before acting on any harmful thoughts. Cooperative with assessment.  A: Met with pt 1:1. Medications administered as prescribed. Support and encouragement provided to attend groups and engage in milieu. Pt encouraged to discuss feelings and come to staff with any question or concerns.  R: Patient remains safe and complaint with medications.

## 2015-10-08 NOTE — Progress Notes (Signed)
Patient ID: Bruce [name], male   DOB: May 09, 1976, 40 y.o.   MRN: [id number] [facility] MD Progress Note  10/08/2015 2:13 PM Bruce [name]  MRN:  [id number]  Subjective: Bruce [name] reports, "I'm still hearing voices "       Objective: Bruce [name] is a 40 year old African-American male. Admitted to [facility] from the [facility] observation unit with complaints of auditory hallucinations telling him to kill himself & others.  Patient seen and chart reviewed.   Patient continues to report AH , but states he is able to cope with them. His AH is chronic , persisting and he is able to cope with it . Pt continues to report passive SI - but walking , talking to people helps him to ignore the thoughts.  Pt has had several medication changes since admission - pt always however states he is symptomatic and that he is suicidal.Pt has presented with extreme symptomatology since admission , although multiple medication changes were done.   Pt on the unit is observed as not responding to internal stimuli , is not observed as though he is in distress from his AH/VH . Pt per staff last night was observed as sleeping well and has a good appetite. Pt is attending groups , seen as taking care of his ADLS , his appetite is good , he is observed as sleeping without any distress , eventhough he always rates his depression as high.  Hence it appears that there continues to be  significant discrepancies between the subjective presentation and objective sx . And also his report yesterday about him wanting to stay on the unit until Monday ,when he states all his sx will disappear also seems to indicate a secondary gain .        Principal Problem: Schizophrenia (HCC)  Diagnosis:   Patient Active Problem List   Diagnosis Date Noted  . Paranoid schizophrenia (HCC) [F20.0]   . 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 Alcohol use disorder, moderate, dependence (HCC) [F10.20] 09/22/2015  . Morbid obesity (HCC) [E66.01] 09/22/2015  .  Schizophrenia (HCC) [F20.9] 09/21/2015   Total Time spent with patient: 15 minutes  Past Psychiatric History: Please see H&P  Past Medical History:  Past Medical History  Diagnosis Date  . Depression   . Schizophrenia (HCC)   . Obesity    Family History: Denies hx of HTN, DM, cardiac disease, thyroid disease in family.  Family History  Problem Relation Age of Onset  . Schizophrenia Mother    Family Psychiatric  History: Mother has schizophrenia. Pt denies substance abuse in family.  Social History: Pt is single, is currently homeless, denies legal issues. History  Alcohol Use  . 16.8 oz/week  . 28 Cans of beer per week     History  Drug Use No    Social History   Social History  . Marital Status: Single    Spouse Name: N/A  . Number of Children: N/A  . Years of Education: N/A   Social History Main Topics  . Smoking status: Never Smoker   . Smokeless tobacco: None  . Alcohol Use: 16.8 oz/week    28 Cans of beer per week  . Drug Use: No  . Sexual Activity: Not Currently   Other Topics Concern  . None   Social History Narrative   Additional Social History:  Sleep: Fair (6 hours per documentation).  Appetite:  Fair  Current Medications: Current Facility-Administered Medications  Medication Dose Route Frequency Provider Last Rate Last Dose  .  acetaminophen (TYLENOL) tablet 650 mg  650 mg Oral Q6H PRN Charm Rings, NP   650 mg at 10/04/15 2111  . alum & mag hydroxide-simeth (MAALOX/MYLANTA) 200-200-20 MG/5ML suspension 30 mL  30 mL Oral Q4H PRN Charm Rings, NP      . ARIPiprazole (ABILIFY) tablet 5 mg  5 mg Oral QPM Bruce Chicoine, MD      . ARIPiprazole SUSR 400 mg  400 mg Intramuscular Q28 days Jomarie Longs, MD   400 mg at 10/08/15 1139  . benztropine (COGENTIN) tablet 0.5 mg  0.5 mg Oral BID WC Jomarie Longs, MD   0.5 mg at 10/08/15 0820  . docusate sodium (COLACE) capsule 100 mg  100 mg Oral BID Jomarie Longs, MD   100 mg at 10/08/15 0819  .  doxepin (SINEQUAN) capsule 50 mg  50 mg Oral QHS Jomarie Longs, MD   50 mg at 10/07/15 2140  . gabapentin (NEURONTIN) capsule 200 mg  200 mg Oral TID Jomarie Longs, MD   200 mg at 10/08/15 1133  . loperamide (IMODIUM) capsule 2 mg  2 mg Oral PRN Thermon Leyland, NP   2 mg at 10/06/15 1610  . magnesium hydroxide (MILK OF MAGNESIA) suspension 30 mL  30 mL Oral Daily PRN Charm Rings, NP      . menthol-cetylpyridinium (CEPACOL) lozenge 3 mg  1 lozenge Oral PRN Oneta Rack, NP   3 mg at 10/07/15 1003  . metFORMIN (GLUCOPHAGE) tablet 500 mg  500 mg Oral Q breakfast Jomarie Longs, MD   500 mg at 10/08/15 9604  . multivitamin with minerals tablet 1 tablet  1 tablet Oral Daily Charm Rings, NP   1 tablet at 10/08/15 [phone]  . OLANZapine (ZYPREXA) tablet 20 mg  20 mg Oral QHS Jomarie Longs, MD   20 mg at 10/07/15 2140  . OLANZapine (ZYPREXA) tablet 5 mg  5 mg Oral [id number] Jomarie Longs, MD   5 mg at 10/08/15 1134  . risperiDONE (RISPERDAL M-TABS) disintegrating tablet 2 mg  2 mg Oral Q8H PRN Jomarie Longs, MD   2 mg at 10/06/15 1501   And  . ziprasidone (GEODON) injection 20 mg  20 mg Intramuscular PRN Jomarie Longs, MD      . venlafaxine XR (EFFEXOR-XR) 24 hr capsule 225 mg  225 mg Oral Q breakfast Maizie Garno, MD   225 mg at 10/08/15 0818   Lab Results:  No results found for this or any previous visit (from the past 48 hour(s)).  Physical Findings: AIMS: Facial and Oral Movements Muscles of Facial Expression: None, normal Lips and Perioral Area: None, normal Jaw: None, normal Tongue: None, normal,Extremity Movements Upper (arms, wrists, hands, fingers): None, normal Lower (legs, knees, ankles, toes): None, normal, Trunk Movements Neck, shoulders, hips: None, normal, Overall Severity Severity of abnormal movements (highest score from questions above): None, normal Incapacitation due to abnormal movements: None, normal Patient's awareness of abnormal movements (rate only patient's  report): No Awareness, Dental Status Current problems with teeth and/or dentures?: No Does patient usually wear dentures?: No  CIWA:  CIWA-Ar Total: 1 COWS:  COWS Total Score: 2  Musculoskeletal: Strength & Muscle Tone: within normal limits Gait & Station: normal Patient leans: N/A  Psychiatric Specialty Exam: Review of Systems  Psychiatric/Behavioral: Positive for depression, suicidal ideas, hallucinations and substance abuse. The patient is nervous/anxious.   All other systems reviewed and are negative.   Blood pressure 110/71, pulse 107, temperature 98.4 F (36.9 C), temperature  source Oral, resp. rate 20, height  (1.727 m), weight 174.181 kg (384 lb), SpO2 97 %.Body mass index is 58.4 kg/(m^2).  General Appearance: Casual  Eye Contact::  Fair  Speech:  Clear and Coherent  Volume:  Normal  Mood:  Anxious and Depressed  Affect:  Appropriate does not seem to be in distress from anxiety , does not appear as depressed as her rates his depression  Thought Process:  Coherent  Orientation:  Full (Time, Place, and Person)  Thought Content:  Hallucinations: Auditory, Paranoid Ideation and Rumination however is not seen as responding to internal stimuli, is seen on the unit as attending groups , interactive with staff and peers.  Suicidal Thoughts:  Yes.  without intent/plan is chronic - he is able to cope with his thoughts  Homicidal Thoughts:  No  Memory:  Immediate;   Fair Recent;   Fair Remote;   Fair  Judgement:  Impaired  Insight:  Shallow  Psychomotor Activity:  Normal  Concentration:  Fair  Recall:  [employer] of Knowledge:Fair  Language: Fair  Akathisia:  No  Handed:  Right  AIMS (if indicated):     Assets:  Others:  access to healthcare  ADL's:  Intact  Cognition: WNL  Sleep:  Number of Hours: 6   Treatment Plan Summary: Neri is a 40 year old African-American male,who has a hx of alcohol abuse, schizophrenia, who presented to [facility] with worsening psychosis and  SI. Pt today continues to report depression, psychosis and passive SI.   There is significant discrepancies between the subjective presentation and objective sx . And also his report yesterday about him wanting to stay on the unit until Monday ,when he states all his sx will disappear also seems to indicate a secondary gain .  Pt received his Abilify Maintena IM today . Pt to be discharged to [facility] tomorrow.    Daily contact with patient to assess and evaluate symptoms and progress in treatment and Medication management  Reviewed past medical records,treatment plan.  Will continue Zyprexa 5 mg po bid and 20 mg po qhs for psychosis.  Reduce Abilify to 5 mg po daily  for mood control for 21 days - pt received his Abilify Maintena IM 400 mg today ( 10/08/15). Repeat q 28 days. Continue Metformin 500 mg po daily for weight gain 2/2 antipsychotic therapy. Continue Effexor XR to 225 mg po daily for affective symptoms. Will continue Cogentin 0.5 mg po bid for EPS. Will continue Doxepin 50 mg po qhs for sleep tonight. Will make available PRN medications as per agitation protocol. Will continue Gabapentin  200 mg po tid for anxiety symptoms. Will continue to monitor vitals, medication compliance and treatment side effects while patient is here.  Will monitor for medical issues as well as call consult as needed.  Reviewed labs ,PL elevated - will monitor on an out patient basis. Recreational therapist consult.  CSW will start working on disposition.  Patient to participate in therapeutic milieu .   Cleburne Savini MD 10/08/2015, 2:13 PM

## 2015-10-08 NOTE — Progress Notes (Signed)
  [facility] Adult Case Management Discharge Plan :  Will you be returning to the same living situation after discharge:  No. At discharge, do you have transportation home?: Yes,  bus pass Do you have the ability to pay for your medications: Yes,  mental health  Release of information consent forms completed and in the chart;  [name] needed at discharge.  Patient to Follow up at: Follow-up Information    Follow up with [facility].   Specialty:  Behavioral Health   Why:  Go to the walkin clinic Monday through Friday between 8 and 11AM for you hospital follow up appointment.  If you end up in [facility], go to [facility].  You can reach the shelter in [place] at [phone]   Contact information:   [address] [place] [address] [phone]       Next level of care provider has access to [facility] Link:no  Safety Planning and Suicide Prevention discussed: Yes,  yes  Have you used any form of tobacco in the last 30 days? (Cigarettes, Smokeless Tobacco, Cigars, and/or Pipes): No  Has patient been referred to the Quitline?: N/A patient is not a smoker  Patient has been referred for addiction treatment: N/A  [name] 10/08/2015, 10:25 AM

## 2015-10-08 NOTE — BHH Group Notes (Signed)
[facility] LCSW Aftercare Discharge Planning Group Note   10/08/2015 12:09 PM  Participation Quality:  Engaged  Mood/Affect:  Flat  Depression Rating:  6  Anxiety Rating:  6  Thoughts of Suicide:  No Will you contract for safety?   NA  Current AVH:  No  Plan for Discharge/Comments:  Pt acknowledges that Dr has set d/c date for tomorrow.  Is goal directed and talking in detail about his plan.  "I need to get out of here by 8AM so that I can get to the [facility] first thing.  I have a better chance of getting a bed.  He agreed to call [facility] with me after the group, and we found out they had a bed.  "I don't want to go there today.  I hope to be closer to my mother.  But please write down their number so I can call them tomorrow if things don't work out here."  Instructed him to go to [facility] if in [place], [facility] if in [place] and [facility] if in [place].  He understood and agreed.  Transportation Means:   Supports:  Bruce [name]

## 2015-10-08 NOTE — BHH Suicide Risk Assessment (Signed)
[facility] Discharge Suicide Risk Assessment   Principal Problem: Schizophrenia [facility]) Discharge Diagnoses:  Patient Active Problem List   Diagnosis Date Noted  . Paranoid schizophrenia (HCC) [F20.0]   . 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 Alcohol use disorder, moderate, dependence (HCC) [F10.20] 09/22/2015  . Morbid obesity (HCC) [E66.01] 09/22/2015  . Schizophrenia (HCC) [F20.9] 09/21/2015    Total Time spent with patient: 30 minutes  Musculoskeletal: Strength & Muscle Tone: within normal limits Gait & Station: normal Patient leans: N/A  Psychiatric Specialty Exam: Review of Systems  Psychiatric/Behavioral: Positive for depression (improving), suicidal ideas (passive chronic SI) and hallucinations (chronic AH).  All other systems reviewed and are negative.   Blood pressure 110/71, pulse 107, temperature 98.4 F (36.9 C), temperature source Oral, resp. rate 20, height  (1.727 m), weight 174.181 kg (384 lb), SpO2 97 %.Body mass index is 58.4 kg/(m^2).  General Appearance: Casual  Eye Contact::  Fair  Speech:  Clear and Coherent409  Volume:  Normal  Mood:  Depressed  Affect:  Congruent  Thought Process:  Coherent  Orientation:  Full (Time, Place, and Person)  Thought Content:  Pt with chronic AH - persisting - is able to cope with it/ has developed coping techniques to ignore it   Suicidal Thoughts:  has chronic passive SI - is able to ignore his thoughts  Homicidal Thoughts:  Yes.  without intent/plan to people in general - however can ignore his thoughts , has not exhibited any violent /aggressive behavior on the unit.  Memory:  Immediate;   Fair Recent;   Fair Remote;   Fair  Judgement:  Fair  Insight:  Fair  Psychomotor Activity:  Normal  Concentration:  Fair  Recall:  [employer] of Knowledge:Fair  Language: Fair  Akathisia:  No  Handed:  Right  AIMS (if indicated):     Assets:  Desire for Improvement  Sleep:  Number of Hours: 6  Cognition: WNL   ADL's:  Intact   Mental Status Per Nursing Assessment::   On Admission:     Demographic Factors:  Male and Low socioeconomic status  Loss Factors: NA  Historical Factors: Impulsivity  Risk Reduction Factors:   Positive coping skills or problem solving skills  Continued Clinical Symptoms:  Previous Psychiatric Diagnoses and Treatments  Cognitive Features That Contribute To Risk:  Polarized thinking     Patient continues to report AH , but states he knows they are getting better. Pt continues to report passive SI , but stated yesterday that he will not be suicidal by Monday. Pt has had several medication changes since admission - pt always however states he is symptomatic and that he is suicidal.Pt has presented with extreme symptomatology since admission , although multiple medication changes were done. Pt on the unit is observed as not responding to internal stimuli , is not observed as though he is in distress from his AH/VH . Pt is attending groups , seen as taking care of his ADLS , his appetite is good , he is observed as sleeping without any distress , eventhough he always rates his depression as high.  Hence it appears that there is significant discrepancies between the subjective presentation and objective sx . And also his report that he wants to stay on the unit until Monday ,when he states all his sx will disappear also seems to indicate a secondary gain .     Suicide Risk:   Acute risk : Minimal: Pt with chronic SI - persisting ,  has not exhibited any self injurious behavior on the unit.Pt is currently stable on medications , does not have access to weapons, has after care referrals made for continued support outside , denies family hx of suicide.   Follow-up Information    Follow up with [facility].   Specialty:  Behavioral Health   Why:  Go to the walkin clinic Monday through Friday between 8 and 11AM for you hospital follow up appointment.  If you end up in [facility], go to [facility].  You can reach the shelter in [employer] at [phone] information:   [address] [place] [address] [phone]       Plan Of Care/Follow-up recommendations:  Activity:  no restrictions Diet:  regular Tests:  as needed Other:  follow up with aftercare  Valleri Hendricksen, MD 10/08/2015, 3:04 PM

## 2015-10-09 MED ORDER — VENLAFAXINE HCL ER 75 MG PO CP24
225.0000 mg | ORAL_CAPSULE | Freq: Every day | ORAL | Status: DC
  Filled 2015-10-09: qty 21

## 2015-10-09 NOTE — Progress Notes (Signed)
Patient ID: Bruce [name], male   DOB: Jan 27, 1976, 41 y.o.   MRN: [id number]  D: Patient reports he feels ready for discharge. Reports mood improvement with no suicidal ideations. A: Obtained all belongings from room, locker, prescriptions, sample medications and follow-up appointment at [facility]. R: Given bus pass at this time.

## 2016-04-29 ENCOUNTER — Encounter (HOSPITAL_COMMUNITY): Payer: Self-pay | Admitting: Emergency Medicine

## 2016-04-29 ENCOUNTER — Emergency Department (HOSPITAL_COMMUNITY)
Admission: EM | Admit: 2016-04-29 | Discharge: 2016-05-01 | Disposition: A | Discharge status: Other Acute Inpatient Hospital | Payer: Medicaid Other | Attending: Emergency Medicine | Admitting: Emergency Medicine

## 2016-04-29 DIAGNOSIS — R45851 Suicidal ideations: Secondary | ICD-10-CM

## 2016-04-29 DIAGNOSIS — Z79899 Other long term (current) drug therapy: Secondary | ICD-10-CM | POA: Insufficient documentation

## 2016-04-29 DIAGNOSIS — Z7984 Long term (current) use of oral hypoglycemic drugs: Secondary | ICD-10-CM | POA: Insufficient documentation

## 2016-04-29 DIAGNOSIS — F329 Major depressive disorder, single episode, unspecified: Secondary | ICD-10-CM | POA: Insufficient documentation

## 2016-04-29 LAB — COMPREHENSIVE METABOLIC PANEL
ALT: 24 U/L (ref 17–63)
ANION GAP: 5 (ref 5–15)
AST: 22 U/L (ref 15–41)
Albumin: 3.2 g/dL — ABNORMAL LOW (ref 3.5–5.0)
Alkaline Phosphatase: 70 U/L (ref 38–126)
BUN: 7 mg/dL (ref 6–20)
CHLORIDE: 108 mmol/L (ref 101–111)
CO2: 29 mmol/L (ref 22–32)
Calcium: 9.5 mg/dL (ref 8.9–10.3)
Creatinine, Ser: 0.94 mg/dL (ref 0.61–1.24)
GFR calc non Af Amer: >60 mL/min (ref >60)
Glucose, Bld: 104 mg/dL — ABNORMAL HIGH (ref 65–99)
Potassium: 3.9 mmol/L (ref 3.5–5.1)
SODIUM: 142 mmol/L (ref 135–145)
Total Bilirubin: 0.5 mg/dL (ref 0.3–1.2)
Total Protein: 7.4 g/dL (ref 6.5–8.1)

## 2016-04-29 LAB — CBC
HEMATOCRIT: 41.3 % (ref 39.0–52.0)
HEMOGLOBIN: 13.5 g/dL (ref 13.0–17.0)
MCH: 29.1 pg (ref 26.0–34.0)
MCHC: 32.7 g/dL (ref 30.0–36.0)
MCV: 89 fL (ref 78.0–100.0)
Platelets: 243 10*3/uL (ref 150–400)
RBC: 4.64 MIL/uL (ref 4.22–5.81)
RDW: 13.7 % (ref 11.5–15.5)
WBC: 7.6 10*3/uL (ref 4.0–10.5)

## 2016-04-29 LAB — RAPID URINE DRUG SCREEN, HOSP PERFORMED
AMPHETAMINES: NOT DETECTED
BARBITURATES: NOT DETECTED
BENZODIAZEPINES: NOT DETECTED
Cocaine: POSITIVE — AB
Opiates: NOT DETECTED
Tetrahydrocannabinol: POSITIVE — AB

## 2016-04-29 LAB — ETHANOL: Alcohol, Ethyl (B): <5 mg/dL (ref <5)

## 2016-04-29 MED ORDER — ONDANSETRON HCL 4 MG PO TABS: 4.0000 mg | ORAL_TABLET | Freq: Three times a day (TID) | ORAL | Status: DC | PRN

## 2016-04-29 MED ORDER — ACETAMINOPHEN 325 MG PO TABS: 650.0000 mg | ORAL_TABLET | ORAL | Status: DC | PRN

## 2016-04-29 MED ORDER — IBUPROFEN 400 MG PO TABS: 600.0000 mg | ORAL_TABLET | Freq: Three times a day (TID) | ORAL | Status: DC | PRN

## 2016-04-29 MED ORDER — ZOLPIDEM TARTRATE 5 MG PO TABS: 5.0000 mg | ORAL_TABLET | Freq: Every evening | ORAL | Status: DC | PRN

## 2016-04-29 MED ORDER — ALUM & MAG HYDROXIDE-SIMETH 200-200-20 MG/5ML PO SUSP: 30.0000 mL | ORAL | Status: DC | PRN

## 2016-04-29 NOTE — ED Triage Notes (Signed)
Pt states he is hearing voices that are telling him to hurt himself and hurt others. Pt has a plan to jump off a bridge. Pt states he receives abilify shots but does not feel that they are helping him.

## 2016-04-29 NOTE — BHH Counselor (Signed)
Clinician spoke to Gil[place]Joyce, [place] and expressed before an assessment can be completed a note by a EDP, NP or PA must be completed on the pt. Clinician noted from the nurse she will work on getting that completed. Clinician reported she will check on EP[facility] to see when the note is completed, and contact her to set up the tele-assessment.  Gwinda Passereylese D Bennett, MS, [facility], [facility] Triage Specialist [phone]

## 2016-04-29 NOTE — ED Triage Notes (Signed)
Pt placed in gown and wanded by security

## 2016-04-29 NOTE — ED Provider Notes (Signed)
[facility]-EMERGENCY DEPT Provider Note   CSN: [id number] Arrival date & time: 04/29/16  1355     History   Chief Complaint Chief Complaint  Patient presents with  . Suicidal    HPI Bruce [name] is a 40 y.o. male. He has a history of schizophrenia. States he gets Abilify monthly injections. He also takes cervical for sleep. He states that he felt like it was helping him for some time. However, he states now that he is feeling suicidal and concerned that he may hurt himself and presents here. Has done nothing to hurt himself. He denies any ingestions. No alcohol or illicit drugs today. States he has a plan to go to a bridge and jump. He states he's gone to the bridge before in a truck with by underneath him and it startled him and he felt like it "should be out of it". States he was walking to the bridge today and Walking and presented here.  HPI  Past Medical History:  Diagnosis Date  . Depression   . Obesity   . Schizophrenia [facility])     Patient Active Problem List   Diagnosis Date Noted  . Paranoid schizophrenia (HCC)   . Hyperprolactinemia (HCC) 09/24/2015  . Alcohol use disorder, moderate, dependence (HCC) 09/22/2015  . Morbid obesity (HCC) 09/22/2015  . Schizophrenia (HCC) 09/21/2015    History reviewed. No pertinent surgical history.     Home Medications    Prior to Admission medications   Medication Sig Start Date End Date Taking? Authorizing Provider  ARIPiprazole (ABILIFY) 5 MG tablet Take 1 tablet (5 mg total) by mouth every evening. Patient not taking: Reported on 04/29/2016 10/08/15   Adonis Brook, NP  ARIPiprazole 400 MG SUSR Inject 400 mg into the muscle every 28 (twenty-eight) days. Inject 400 mg into the muscle every 28 (twenty-eight) days. Last dose 10/08/2015.  Next dose 11/05/2015. Patient not taking: Reported on 04/29/2016 10/08/15   Adonis Brook, NP  benztropine (COGENTIN) 0.5 MG tablet Take 1 tablet (0.5 mg total) by mouth 2 (two) times daily with a  meal. Patient not taking: Reported on 04/29/2016 10/08/15   Adonis Brook, NP  doxepin (SINEQUAN) 50 MG capsule Take 1 capsule (50 mg total) by mouth at bedtime. Patient not taking: Reported on 04/29/2016 10/08/15   Adonis Brook, NP  gabapentin (NEURONTIN) 100 MG capsule Take 2 capsules (200 mg total) by mouth 3 (three) times daily. Patient not taking: Reported on 04/29/2016 10/08/15   Adonis Brook, NP  metFORMIN (GLUCOPHAGE) 500 MG tablet Take 1 tablet (500 mg total) by mouth daily with breakfast. Patient not taking: Reported on 04/29/2016 10/08/15   Adonis Brook, NP  OLANZapine (ZYPREXA) 20 MG tablet Take 1 tablet (20 mg total) by mouth at bedtime. Patient not taking: Reported on 04/29/2016 10/08/15   Adonis Brook, NP  OLANZapine (ZYPREXA) 5 MG tablet Take 1 tablet (5 mg total) by mouth 2 (two) times daily at 7 am and 12 noon. Patient not taking: Reported on 04/29/2016 10/08/15   Adonis Brook, NP  venlafaxine XR (EFFEXOR-XR) 75 MG 24 hr capsule Take 3 capsules (225 mg total) by mouth daily with breakfast. Patient not taking: Reported on 04/29/2016 10/08/15   Adonis Brook, NP    Family History Family History  Problem Relation Age of Onset  . Schizophrenia Mother     Social History Social History  Substance Use Topics  . Smoking status: Never Smoker  . Smokeless tobacco: Never Used  . Alcohol use Yes  Allergies   Review of patient's allergies indicates no known allergies.   Review of Systems Review of Systems  Constitutional: Negative for appetite change, chills, diaphoresis, fatigue and fever.  HENT: Negative for mouth sores, sore throat and trouble swallowing.   Eyes: Negative for visual disturbance.  Respiratory: Negative for cough, chest tightness, shortness of breath and wheezing.   Cardiovascular: Negative for chest pain.  Gastrointestinal: Negative for abdominal distention, abdominal pain, diarrhea, nausea and vomiting.  Endocrine: Negative for polydipsia, polyphagia and  polyuria.  Genitourinary: Negative for dysuria, frequency and hematuria.  Musculoskeletal: Negative for gait problem.  Skin: Negative for color change, pallor and rash.  Neurological: Negative for dizziness, syncope, light-headedness and headaches.  Hematological: Does not bruise/bleed easily.  Psychiatric/Behavioral: Positive for dysphoric mood and suicidal ideas. Negative for behavioral problems and confusion.     Physical Exam Updated Vital Signs BP 127/74 (BP Location: Right Arm)   Pulse 84   Temp 98.3 F (36.8 C) (Oral)   Resp 20   Ht 5\' 8"  (1.727 m)   Wt (!) 350 lb (158.8 kg)   SpO2 99%   BMI 53.22 kg/m   Physical Exam  Constitutional: He is oriented to person, place, and time. He appears well-developed and well-nourished. No distress.  HENT:  Head: Normocephalic.  Eyes: Conjunctivae are normal. Pupils are equal, round, and reactive to light. No scleral icterus.  Neck: Normal range of motion. Neck supple. No thyromegaly present.  Cardiovascular: Normal rate and regular rhythm.  Exam reveals no gallop and no friction rub.   No murmur heard. Pulmonary/Chest: Effort normal and breath sounds normal. No respiratory distress. He has no wheezes. He has no rales.  Abdominal: Soft. Bowel sounds are normal. He exhibits no distension. There is no tenderness. There is no rebound.  Musculoskeletal: Normal range of motion.  Neurological: He is alert and oriented to person, place, and time.  Skin: Skin is warm and dry. No rash noted.  Psychiatric: His behavior is normal. He exhibits a depressed mood.  Large stature African-American male. Awake and alert. Calm.     ED Treatments / Results  Labs (all labs ordered are listed, but only abnormal results are displayed) Labs Reviewed  COMPREHENSIVE METABOLIC PANEL - Abnormal; Notable for the following:       Result Value   Glucose, Bld 104 (*)    Albumin 3.2 (*)    All other components within normal limits  URINE RAPID DRUG SCREEN,  HOSP PERFORMED - Abnormal; Notable for the following:    Cocaine POSITIVE (*)    Tetrahydrocannabinol POSITIVE (*)    All other components within normal limits  ETHANOL  CBC    EKG  EKG Interpretation None       Radiology No results found.  Procedures Procedures (including critical care time)  Medications Ordered in ED Medications  acetaminophen (TYLENOL) tablet 650 mg (not administered)  ibuprofen (ADVIL,MOTRIN) tablet 600 mg (not administered)  zolpidem (AMBIEN) tablet 5 mg (not administered)  ondansetron (ZOFRAN) tablet 4 mg (not administered)  alum & mag hydroxide-simeth (MAALOX/MYLANTA) 200-200-20 MG/5ML suspension 30 mL (not administered)     Initial Impression / Assessment and Plan / ED Course  I have reviewed the triage vital signs and the nursing notes.  Pertinent labs & imaging results that were available during my care of the patient were reviewed by me and considered in my medical decision making (see chart for details).  Clinical Course    Patient medically clear for TTS valuation. Positive  for [facility] and cocaine in his urine no alcohol negative CBC, and comprehensive.  Final Clinical Impressions(s) / ED Diagnoses   Final diagnoses:  Suicidal ideation    New Prescriptions New Prescriptions   No medications on file     Rolland Porter, MD 04/29/16 2328

## 2016-04-30 DIAGNOSIS — F200 Paranoid schizophrenia: Secondary | ICD-10-CM | POA: Diagnosis not present

## 2016-04-30 DIAGNOSIS — Z818 Family history of other mental and behavioral disorders: Secondary | ICD-10-CM

## 2016-04-30 DIAGNOSIS — F1219 Cannabis abuse with unspecified cannabis-induced disorder: Secondary | ICD-10-CM

## 2016-04-30 DIAGNOSIS — F14922 Cocaine use, unspecified with intoxication with perceptual disturbance: Secondary | ICD-10-CM | POA: Diagnosis not present

## 2016-04-30 DIAGNOSIS — F1099 Alcohol use, unspecified with unspecified alcohol-induced disorder: Secondary | ICD-10-CM

## 2016-04-30 DIAGNOSIS — Z79899 Other long term (current) drug therapy: Secondary | ICD-10-CM

## 2016-04-30 DIAGNOSIS — R45851 Suicidal ideations: Secondary | ICD-10-CM

## 2016-04-30 DIAGNOSIS — Z791 Long term (current) use of non-steroidal anti-inflammatories (NSAID): Secondary | ICD-10-CM

## 2016-04-30 MED ORDER — VENLAFAXINE HCL ER 150 MG PO CP24
150.0000 mg | ORAL_CAPSULE | Freq: Every day | ORAL | Status: DC
  Administered 2016-04-30 – 2016-05-01 (×2): 150 mg via ORAL
  Filled 2016-04-30 (×2): qty 1

## 2016-04-30 MED ORDER — QUETIAPINE FUMARATE 25 MG PO TABS
100.0000 mg | ORAL_TABLET | Freq: Every day | ORAL | Status: DC
  Administered 2016-04-30: 100 mg via ORAL
  Filled 2016-04-30: qty 4
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
## 2016-04-30 NOTE — ED Notes (Signed)
Patient given a snack 

## 2016-04-30 NOTE — ED Notes (Signed)
Patient calm meal tray delivered.
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
## 2016-04-30 NOTE — ED Notes (Signed)
Patient Lunch has been ordered. 

## 2016-04-30 NOTE — ED Notes (Signed)
Breakfast tray ordered [id number]

## 2016-04-30 NOTE — Progress Notes (Signed)
Seeking inpatient treatment for pt at recommendation of TTS.  Referred to: [facility]- left voicemail for intake Earlene Plateravis- per Edmonia Jamesracy [facility]- per Ezzard StandingSuzanne Duplin [facility]  Left voicemail for Turner DanielsRowan and will refer if there is bed availability. Can be considered for admission to [facility] upon appropriate bed availability- [facility]  Ilean SkillMeghan Zakari Bathe, MSW, LCSW Clinical Social Work, Disposition  04/30/2016 [phone]

## 2016-05-01 ENCOUNTER — Inpatient Hospital Stay (HOSPITAL_COMMUNITY)
Admission: AD | Admit: 2016-05-01 | Discharge: 2016-05-07 | DRG: 885 | Disposition: A | Discharge status: Home / Self Care | Payer: Medicaid Other | Source: Intra-hospital | Attending: Psychiatry | Admitting: Psychiatry

## 2016-05-01 ENCOUNTER — Encounter (HOSPITAL_COMMUNITY): Payer: Self-pay | Admitting: Emergency Medicine

## 2016-05-01 DIAGNOSIS — E221 Hyperprolactinemia: Secondary | ICD-10-CM | POA: Diagnosis present

## 2016-05-01 DIAGNOSIS — Z915 Personal history of self-harm: Secondary | ICD-10-CM | POA: Diagnosis not present

## 2016-05-01 DIAGNOSIS — Z6841 Body Mass Index (BMI) 40.0 and over, adult: Secondary | ICD-10-CM | POA: Diagnosis not present

## 2016-05-01 DIAGNOSIS — Z7984 Long term (current) use of oral hypoglycemic drugs: Secondary | ICD-10-CM | POA: Diagnosis not present

## 2016-05-01 DIAGNOSIS — Z79899 Other long term (current) drug therapy: Secondary | ICD-10-CM

## 2016-05-01 DIAGNOSIS — F200 Paranoid schizophrenia: Secondary | ICD-10-CM | POA: Diagnosis not present

## 2016-05-01 DIAGNOSIS — G4700 Insomnia, unspecified: Secondary | ICD-10-CM | POA: Diagnosis present

## 2016-05-01 DIAGNOSIS — F329 Major depressive disorder, single episode, unspecified: Secondary | ICD-10-CM | POA: Diagnosis present

## 2016-05-01 DIAGNOSIS — F209 Schizophrenia, unspecified: Secondary | ICD-10-CM | POA: Diagnosis present

## 2016-05-01 DIAGNOSIS — F1410 Cocaine abuse, uncomplicated: Secondary | ICD-10-CM | POA: Clinically undetermined

## 2016-05-01 DIAGNOSIS — Z590 Homelessness: Secondary | ICD-10-CM

## 2016-05-01 DIAGNOSIS — F1414 Cocaine abuse with cocaine-induced mood disorder: Secondary | ICD-10-CM | POA: Diagnosis not present

## 2016-05-01 DIAGNOSIS — F32A Depression, unspecified: Secondary | ICD-10-CM | POA: Diagnosis present

## 2016-05-01 DIAGNOSIS — F1020 Alcohol dependence, uncomplicated: Secondary | ICD-10-CM | POA: Diagnosis present

## 2016-05-01 DIAGNOSIS — F1210 Cannabis abuse, uncomplicated: Secondary | ICD-10-CM | POA: Diagnosis not present

## 2016-05-01 DIAGNOSIS — Z818 Family history of other mental and behavioral disorders: Secondary | ICD-10-CM

## 2016-05-01 DIAGNOSIS — R45851 Suicidal ideations: Secondary | ICD-10-CM | POA: Diagnosis not present

## 2016-05-01 DIAGNOSIS — E119 Type 2 diabetes mellitus without complications: Secondary | ICD-10-CM | POA: Diagnosis present

## 2016-05-01 MED ORDER — OLANZAPINE 5 MG PO TABS
5.0000 mg | ORAL_TABLET | ORAL | Status: DC
  Administered 2016-05-02 – 2016-05-07 (×12): 5 mg via ORAL
  Filled 2016-05-01: qty 1
  Filled 2016-05-01: qty 2
  Filled 2016-05-01 (×14): qty 1

## 2016-05-01 MED ORDER — BENZTROPINE MESYLATE 0.5 MG PO TABS
0.5000 mg | ORAL_TABLET | Freq: Two times a day (BID) | ORAL | Status: DC
  Administered 2016-05-02 – 2016-05-07 (×11): 0.5 mg via ORAL
  Filled 2016-05-01 (×15): qty 1

## 2016-05-01 MED ORDER — VENLAFAXINE HCL ER 75 MG PO CP24
225.0000 mg | ORAL_CAPSULE | Freq: Every day | ORAL | Status: DC
  Administered 2016-05-02 – 2016-05-07 (×6): 225 mg via ORAL
  Filled 2016-05-01 (×8): qty 1

## 2016-05-01 MED ORDER — GABAPENTIN 100 MG PO CAPS
200.0000 mg | ORAL_CAPSULE | Freq: Three times a day (TID) | ORAL | Status: DC
  Administered 2016-05-02 – 2016-05-03 (×5): 200 mg via ORAL
  Filled 2016-05-01 (×10): qty 2

## 2016-05-01 MED ORDER — DOXEPIN HCL 50 MG PO CAPS
50.0000 mg | ORAL_CAPSULE | Freq: Every day | ORAL | Status: DC
  Administered 2016-05-01 – 2016-05-06 (×6): 50 mg via ORAL
  Filled 2016-05-01: qty 2
  Filled 2016-05-01 (×8): qty 1

## 2016-05-01 MED ORDER — OLANZAPINE 10 MG PO TABS
20.0000 mg | ORAL_TABLET | Freq: Every day | ORAL | Status: DC
  Administered 2016-05-01 – 2016-05-06 (×6): 20 mg via ORAL
  Filled 2016-05-01 (×9): qty 2

## 2016-05-01 NOTE — ED Notes (Signed)
Breakfast tray ordered 

## 2016-05-01 NOTE — Progress Notes (Signed)
Adult Psychoeducational Group Note  Date:  05/01/2016 Time:  9:25 PM  Group Topic/Focus:  Wrap-Up Group:   The focus of this group is to help patients review their daily goal of treatment and discuss progress on daily workbooks.   Participation Level:  Active  Participation Quality:  Appropriate  Affect:  Appropriate  Cognitive:  Appropriate  Insight: Appropriate  Engagement in Group:  Engaged  Modes of Intervention:  Discussion  Additional Comments: The patient expressed that she attended groups.The patient also said that she had a ok day. Octavio Mannshigpen, Karyl Sharrar Lee 05/01/2016, 9:25 PM

## 2016-05-01 NOTE — ED Notes (Signed)
Bruce [name] arrived for transport 

## 2016-05-01 NOTE — Progress Notes (Signed)
Per previous nurse's report, patient arrived to unit prior to this shift at 1815, where pt was searched and skin assessment was performed.

## 2016-05-02 DIAGNOSIS — F1414 Cocaine abuse with cocaine-induced mood disorder: Secondary | ICD-10-CM

## 2016-05-02 DIAGNOSIS — F1210 Cannabis abuse, uncomplicated: Secondary | ICD-10-CM

## 2016-05-02 NOTE — [facility] Group Notes (Signed)
/  [facility] Group Notes: (Clinical Social Work)   05/02/2016      Type of Therapy:  Group Therapy   Participation Level:  Did Not Attend despite MHT prompting   Ambrose MantleMareida Grossman-Orr, LCSW 05/02/2016, 12:17 PM

## 2016-05-02 NOTE — Progress Notes (Signed)
[name] is seen watching TV in the dayroom...he says   " yeah I'm ok " : when this nurse asks him. A  He sits in the dayroom much of the day and watches tv with his peers.R Safety is in place.

## 2016-05-02 NOTE — Progress Notes (Signed)
Patient has been in his room for the majority of the shift.  Patient has denied SI, HI and AVH this shift.  Patient is withdrawn and forward little to Clinical research associatewriter.   Assess patient for safety, offer medications as prescribed, engage patient in 1:1 staff talks.   Continue to monitor for safety.

## 2016-05-02 NOTE — [facility] Suicide Risk Assessment (Signed)
Bruce [name]   Nursing information obtained from:    Demographic factors:    Current Mental Status:    Loss Factors:    Historical Factors:    Risk Reduction Factors:     Total Time spent with patient: 1 hour Principal Problem: <principal problem not specified> Diagnosis:   Patient Active Problem List   Diagnosis Date Noted  . Suicide ideation [R45.851] 04/30/2016  . Paranoid schizophrenia (HCC) [F20.0]   . 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 Alcohol use disorder, moderate, dependence (HCC) [F10.20] 09/22/2015  . Morbid obesity (HCC) [E66.01] 09/22/2015  . Schizophrenia (HCC) [F20.9] 09/21/2015   Subjective Data: Bruce [name] is a 40 years old  Male with a chronic paranoid schizophrenia and also AlcoholUse disorder, morbid obesity [facility] for increased symptoms of psychosis including auditory hallucinations which are command in nature asking him to kill himself or kill other people. Patient cannot contract for safety required acute psychiatric hospitalization. Patient has a previous history of acute psychiatric hospitalization on multiple occasions.  Continued Clinical Symptoms:  Alcohol Use Disorder Identification Test Final Score (AUDIT): 0 The "Alcohol Use Disorders Identification Test", Guidelines for Use in Primary Care, Second Edition.  World Science writerHealth Organization [facility](WHO). Score between 0-7:  no or low risk or alcohol related problems. Score between 8-15:  moderate risk of alcohol related problems. Score between 16-19:  high risk of alcohol related problems. Score 20 or above:  warrants further diagnostic evaluation for alcohol dependence and treatment.   CLINICAL FACTORS:   Depression:   Anhedonia Comorbid alcohol abuse/dependence Delusional Hopelessness Insomnia Recent sense of peace/wellbeing Severe Alcohol/Substance Abuse/Dependencies Schizophrenia:   Command hallucinatons Depressive  state Less than 720 years old Paranoid or undifferentiated type More than one psychiatric diagnosis Unstable or Poor Therapeutic Relationship Previous Psychiatric Diagnoses and Treatments Medical Diagnoses and Treatments/Surgeries    Psychiatric Specialty Exam: Physical Exam  ROS  Blood pressure 130/86, pulse 72, temperature 97.5 F (36.4 C), temperature source Oral, resp. rate 20.There is no height or weight on file to calculate BMI.      COGNITIVE FEATURES THAT CONTRIBUTE TO RISK:  Closed-mindedness, Loss of executive function, Polarized thinking and Thought constriction (tunnel vision)    SUICIDE RISK:   Severe:  Frequent, intense, and enduring suicidal ideation, specific plan, no subjective intent, but some objective markers of intent (i.e., choice of lethal method), the method is accessible, some limited preparatory behavior, evidence of impaired self-control, severe dysphoria/symptomatology, multiple risk factors present, and few if any protective factors, particularly a lack of social support.   PLAN OF CARE: Admitted for increased symptoms of depression secondary to command auditory hallucinations to kill himself and others. Patient is not able to contract for safety. Patient meets criteria for acute psychiatric hospitalization for crisis stabilization, safety monitoring on medication management.  I certify that inpatient services furnished can reasonably be expected to improve the patient's condition.  Leata MouseJANARDHANA Ayssa Bentivegna, MD 05/02/2016, 11:52 AM

## 2016-05-02 NOTE — Progress Notes (Signed)
D: Patient admitted to unit this evening with depression, racing thoughts and suicidal ideation [facility] Pt verbally contracts for safety at this time and denies plan to harm himself. Pt pleasant and cooperative with care and is out in the dayroom among peers and is interacting appropriately. Pt states he has been having command auditory hallucinations telling him to hurt himself. Pt also with thoughts of wanting to harm or "cut" others, especially his uncle due to their recent arguments. Pt states he has not been sleeping well and has felt more tired than usual. Pt compliant with HS medications and attended evening wrap up group session. No signs or symptoms of distress noted.

## 2016-05-02 NOTE — Progress Notes (Signed)
[facility] Group Notes:  (Nursing/MHT/Case Management/Adjunct)  Date:  05/02/2016  Time:  10:02 PM  Type of Therapy:  Psychoeducational Skills  Participation Level:  Minimal  Participation Quality:  Resistant  Affect:  Depressed  Cognitive:  Lacking  Insight:  Lacking  Engagement in Group:  Lacking  Modes of Intervention:  Education  Summary of Progress/Problems: Patient states that he had an "all right" day but did not explain any further.   Farley Crooker S 05/02/2016, 10:02 PM
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
## 2016-05-02 NOTE — Progress Notes (Signed)
Patient skin assessment complete.   Patient had no marks, scars or tattoos.  Patient was noted bo be free of contraband and writer walked patient onto the unit.

## 2016-05-03 DIAGNOSIS — F32A Depression, unspecified: Secondary | ICD-10-CM | POA: Diagnosis present

## 2016-05-03 DIAGNOSIS — F329 Major depressive disorder, single episode, unspecified: Secondary | ICD-10-CM | POA: Diagnosis present

## 2016-05-03 DIAGNOSIS — R45851 Suicidal ideations: Secondary | ICD-10-CM

## 2016-05-03 DIAGNOSIS — F200 Paranoid schizophrenia: Principal | ICD-10-CM

## 2016-05-03 DIAGNOSIS — F1410 Cocaine abuse, uncomplicated: Secondary | ICD-10-CM | POA: Clinically undetermined

## 2016-05-03 DIAGNOSIS — F1210 Cannabis abuse, uncomplicated: Secondary | ICD-10-CM | POA: Clinically undetermined

## 2016-05-03 MED ORDER — ZIPRASIDONE MESYLATE 20 MG IM SOLR: 20.0000 mg | INTRAMUSCULAR | Status: DC | PRN

## 2016-05-03 MED ORDER — MAGNESIUM HYDROXIDE 400 MG/5ML PO SUSP: 30.0000 mL | Freq: Every day | ORAL | Status: DC | PRN

## 2016-05-03 MED ORDER — GABAPENTIN 300 MG PO CAPS
300.0000 mg | ORAL_CAPSULE | Freq: Three times a day (TID) | ORAL | Status: DC
  Administered 2016-05-03 – 2016-05-04 (×3): 300 mg via ORAL
  Filled 2016-05-03 (×8): qty 1

## 2016-05-03 MED ORDER — RISPERIDONE 2 MG PO TBDP
2.0000 mg | ORAL_TABLET | Freq: Three times a day (TID) | ORAL | Status: DC | PRN
  Administered 2016-05-03: 2 mg via ORAL
  Filled 2016-05-03: qty 2

## 2016-05-03 MED ORDER — ALUM & MAG HYDROXIDE-SIMETH 200-200-20 MG/5ML PO SUSP
30.0000 mL | ORAL | Status: DC | PRN
Start: 2016-05-03 — End: 2016-05-08
  Administered 2016-05-06: 30 mL via ORAL
  Filled 2016-05-03: qty 30

## 2016-05-03 MED ORDER — LORAZEPAM 1 MG PO TABS: 1.0000 mg | ORAL_TABLET | ORAL | Status: DC | PRN

## 2016-05-03 MED ORDER — ACETAMINOPHEN 325 MG PO TABS
650.0000 mg | ORAL_TABLET | Freq: Four times a day (QID) | ORAL | Status: DC | PRN
Start: 2016-05-03 — End: 2016-05-08

## 2016-05-03 NOTE — Progress Notes (Signed)
Writer has observed patient sitting up in the dayroom watching tv but no interaction with peers. He attended group. Writer spoke with him 1:1 and he reports that his medications stopped working and he hears voices telling him to kill himself and hurt other people. He contracts verbally. Writer informed him of his scheduled medications and he asked about his seroquel. Writer informed him that an EKG may be ordered by the doctor before being started. He was receptive.  Support given and safety maintained on unit.

## 2016-05-03 NOTE — Progress Notes (Signed)
Patient ID: Bruce [name], male   DOB: Oct 30, 1975, 40 y.o.   MR[facility] [id number] [name]  05/03/2016 2:24 PM Bruce [name] Dimitroff  MRN:  [id number]  Subjective: Bruce [name] reports, "I'm still hearing voices and I am suicidal."       Objective: Bruce BurlyDwight is a 40 year old African-American male,with hx of schizophrenia , who [facility]  with complaints of auditory hallucinations telling him to kill himself & others.  Patient seen and chart reviewed.   Patient continues to report AH ,that is command - asking him to hurt self and others. Pt continues to report SI , with plan to jump off a bridge. Pt also has sleep issues , reports sadness and hopelessness. Per staff - pt remains withdrawn and depressed - continues to need encouragement and support.    Principal Problem: Schizophrenia (HCC)  Diagnosis:   Patient Active Problem List   Diagnosis Date Noted  . Cocaine use disorder, mild, abuse [F14.10] 05/03/2016  . Cannabis use disorder, mild, abuse [F12.10] 05/03/2016  . Suicide ideation [R45.851] 04/30/2016  . Paranoid schizophrenia (HCC) [F20.0]   . 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 Alcohol use disorder, moderate, dependence (HCC) [F10.20] 09/22/2015  . Morbid obesity (HCC) [E66.01] 09/22/2015  . Schizophrenia (HCC) [F20.9] 09/21/2015   Total Time spent with patient: 25 minutes  Past Psychiatric History: Please see H&P  Past Medical History:  Past Medical History:  Diagnosis Date  . Depression   . Obesity   . Schizophrenia (HCC)    Family History: Denies hx of HTN, DM, cardiac disease, thyroid disease in family.  Family History  Problem Relation Age of Onset  . Schizophrenia Mother    Family Psychiatric  History: Mother has schizophrenia. Pt denies substance abuse in family.  Social History: Please see H&P.  History  Alcohol Use  . Yes     History  Drug Use  . Types: Marijuana, "Crack" cocaine, Cocaine    Social History   Social History  .  Marital status: Single    Spouse name: N/A  . Number of children: N/A  . Years of education: N/A   Social History Main Topics  . Smoking status: Never Smoker  . Smokeless tobacco: Never Used  . Alcohol use Yes  . Drug use:     Types: Marijuana, "Crack" cocaine, Cocaine  . Sexual activity: Not Currently   Other Topics Concern  . None   Social History Narrative  . None   Additional Social History:  Sleep: Poor   Appetite:  Poor  Current Medications: Current Facility-Administered Medications  Medication Dose Route Frequency Provider Last Rate Last Dose  . acetaminophen (TYLENOL) tablet 650 mg  650 mg Oral Q6H PRN Jomarie LongsSaramma Cordarrell Sane, MD      . alum & mag hydroxide-simeth (MAALOX/MYLANTA) 200-200-20 MG/5ML suspension 30 mL  30 mL Oral Q4H PRN Carrol Bondar, MD      . benztropine (COGENTIN) tablet 0.5 mg  0.5 mg Oral BID WC Court Joyharles E Kober, PA-C   0.5 mg at 05/03/16 0825  . doxepin (SINEQUAN) capsule 50 mg  50 mg Oral QHS Court Joyharles E Kober, PA-C   50 mg at 05/02/16 2122  . gabapentin (NEURONTIN) capsule 300 mg  300 mg Oral TID Jomarie LongsSaramma Christ Fullenwider, MD      . risperiDONE (RISPERDAL M-TABS) disintegrating tablet 2 mg  2 mg Oral Q8H PRN Jomarie LongsSaramma Ebert Forrester, MD       And  . LORazepam (ATIVAN) tablet 1 mg  1 mg Oral PRN  Jomarie Longs, MD       And  . ziprasidone (GEODON) injection 20 mg  20 mg Intramuscular PRN Jomarie Longs, MD      . magnesium hydroxide (MILK OF MAGNESIA) suspension 30 mL  30 mL Oral Daily PRN Jomarie Longs, MD      . OLANZapine (ZYPREXA) tablet 20 mg  20 mg Oral QHS Court Joy, PA-C   20 mg at 05/02/16 2122  . OLANZapine (ZYPREXA) tablet 5 mg  5 mg Oral [id number] Court Joy, PA-C   5 mg at 05/03/16 1157  . venlafaxine XR (EFFEXOR-XR) 24 hr capsule 225 mg  225 mg Oral Q breakfast Court Joy, PA-C   225 mg at 05/03/16 0825   Lab Results:  No results found for this or any previous visit (from the past 48 hour(s)).  Physical Findings: AIMS: Facial and Oral  Movements Muscles of Facial Expression: None, normal Lips and Perioral Area: None, normal Jaw: None, normal Tongue: None, normal,Extremity Movements Upper (arms, wrists, hands, fingers): None, normal Lower (legs, knees, ankles, toes): None, normal, Trunk Movements Neck, shoulders, hips: None, normal, Overall Severity Severity of abnormal movements (highest score from questions above): None, normal Incapacitation due to abnormal movements: None, normal Patient's awareness of abnormal movements (rate only patient's report): No Awareness, Dental Status Current problems with teeth and/or dentures?: No Does patient usually wear dentures?: No  CIWA:    COWS:     Musculoskeletal: Strength & Muscle Tone: within normal limits Gait & Station: normal Patient leans: N/A  Psychiatric Specialty Exam: Review of Systems  Psychiatric/Behavioral: Positive for depression, hallucinations, substance abuse and suicidal ideas. The patient is nervous/anxious and has insomnia.   All other systems reviewed and are negative.   Blood pressure 117/62, pulse 63, temperature 97.7 F (36.5 C), temperature source Oral, resp. rate 18, height 5\' 8"  (1.727 m), weight (!) 147.4 kg (325 lb).Body mass index is 49.42 kg/m.  General Appearance: Casual  Eye Contact::  Fair  Speech:  Clear and Coherent  Volume:  Normal  Mood:  Anxious and Depressed  Affect:  Congruent   Thought Process:  Coherent  Orientation:  Full (Time, Place, and Person)  Thought Content:  Hallucinations: Auditory, Paranoid Ideation and Rumination command - AH asking him to kill self  Suicidal Thoughts:  Yes.  with intent/plan  Homicidal Thoughts:  No but is hearing voices asking him to kill someone  Memory:  Immediate;   Fair Recent;   Fair Remote;   Fair  Judgement:  Impaired  Insight:  Shallow  Psychomotor Activity:  Normal  Concentration:  Fair  Recall:  [employer] of Knowledge:Fair  Language: Fair  Akathisia:  No  Handed:  Right   AIMS (if indicated):     Assets:  Others:  access to healthcare  ADL's:  Intact  Cognition: WNL  Sleep:  Number of Hours: 6.5   Treatment Plan Summary: Raeqwon is a 40 year old African-American male,who has a hx of alcohol abuse, schizophrenia, who [facility] with worsening psychosis and SI. Pt today continues to report depression, psychosis and SI with plan .   Patient continues to be depressed , psychotic and reports sleep issues - will continue treatment.   Daily contact with patient to assess and evaluate symptoms and progress in treatment and Medication management  Will continue Zyprexa 5 mg po bid and 20 mg po qhs for psychosis. Will continue Effexor xr 225 mg po daily with breakfast for affective sx.  Will continue Doxepin 50 mg po qhs for sleep. Will increase Gabapentin to 300 mg po tid for anxiety sx. Will make available PRN medications as per agitation protocol.  Reviewed past medical records,treatment plan.  Will continue to monitor vitals ,medication compliance and treatment side effects while patient is here.  Will monitor for medical issues as well as call consult as needed.  Reviewed labs PL - elevated - 09/24/15 - will reorder , hba1c-5.4( 09/27/15)  , lipid panel - 09/26/15- wnl , tsh -wnl ( 09/26/15)   ,uds - cocaine, thc , will order ekg for qtc . CSW will continue working on disposition.  Patient to participate in therapeutic milieu .      Amato Sevillano MD 05/03/2016, 2:24 PM

## 2016-05-03 NOTE — Progress Notes (Signed)
D-pt c/o hearing voices that tell him to kill himself, positive for SI, patient given prn meds for voices & anxiety A-pt takes medications but did not attend group this am R-cont to monitor for safety

## 2016-05-03 NOTE — Plan of Care (Signed)
Problem: Medication: Goal: Compliance with prescribed medication regimen will improve Outcome: Progressing Patient was compliant with scheduled hs medication.   

## 2016-05-03 NOTE — [facility] Group Notes (Signed)
[facility] LCSW Group Therapy  05/03/2016 1:15 pm  Type of Therapy: Process Group Therapy  Participation Level:  Active  Participation Quality:  Appropriate  Affect:  Flat  Cognitive:  Oriented  Insight:  Improving  Engagement in Group:  Limited  Engagement in Therapy:  Limited  Modes of Intervention:  Activity, Clarification, Education, Problem-solving and Support  Summary of Progress/Problems: Today's group addressed the issue of overcoming obstacles.  Patients were asked to identify their biggest obstacle post d/c that stands in the way of their on-going success, and then problem solve as to how to manage this. Stayed the entire time, asleep for much of it.  When asked directly, stated his biggest obstacle is hanging out with negative people.  "I guess I need to isolate for awhile.  I also [facility] had a [facility] voucher for me before I went to jail for 6 weeks."  Daryel Geraldorth, Linlee Cromie B 05/03/2016   4:11 PM

## 2016-05-03 NOTE — Progress Notes (Signed)
Recreation Therapy Notes  Date: 05/03/16 Time: 1000 Location: 500 Hall Dayroom  Group Topic: Communication  Goal Area(s) Addresses:  Patient will effectively communicate with peers in group.  Patient will verbalize benefit of healthy communication. Patient will verbalize positive effect of healthy communication on post d/c goals.  Patient will identify communication techniques that made activity effective for group.   Intervention:  Dry erase board, eraser, dry erase marker, strips of paper with random words   Activity: Pictionary.  LRT are divided the group into two teams.  Each person would get a turn.  Each person would draw a strip of paer from the container.  The person has to draw whatever is on the paper,  on the board.  While they are drawing, their team is trying to guess what it is they are drawing.  If the team guesses the picture they get a point.     Education:Communication, Discharge Planning  Education Outcome: Needs additional education.   Clinical Observations/Feedback: Pt did not attend group.   Bruce RancherMarjette Yesena Reaves, LRT/CTRS         Bruce RancherLindsay, Anthoni Geerts A 05/03/2016 12:08 PM

## 2016-05-03 NOTE — Progress Notes (Signed)
Recreation Therapy Notes  INPATIENT RECREATION THERAPY ASSESSMENT  Patient Details Name: Bruce [name] MRN: [id number] DOB: 13-Aug-1976 Today's Date: 05/03/2016  Patient Stressors: Death, Other (Comment) (Pt stated his mom is in assisted living and he doesn't know what to do.)  Pt stated he was here because he was "hearing voices really bad". Pt stated his grandmother died last year.  Coping Skills:   Isolate, Substance Abuse, Avoidance, Self-Injury  Personal Challenges: Decision-Making, Expressing Yourself, Substance Abuse  Leisure Interests (2+):  Individual - TV, Individual - Phone  Awareness of Community Resources:  Yes  Community Resources:  Park  Current Use: Yes  Patient Strengths:  Nice person, real easy to get along with  Patient Identified Areas of Improvement:  Communicate more  Current Recreation Participation:  Every couple weeks  Patient Goal for Hospitalization:  "Stop hearing voices"  [place]  [place] Residence[place]   Current [place] (including self-harm):  Yes (Rated 8 out of 10)  Current HI:  Yes (Rated 10 out of 10)  Consent to Intern Participation: N/A  Caroll RancherMarjette Odeal Welden, LRT/CTRS   Lillia AbedLindsay, Milliana Reddoch A 05/03/2016, 3:22 PM

## 2016-05-03 NOTE — Progress Notes (Signed)
[facility] Group Notes:  (Nursing/MHT/Case Management/Adjunct)  Date:  05/03/2016  Time:  9:44 PM  Type of Therapy:  Psychoeducational Skills  Participation Level:  Active  Participation Quality:  Attentive  Affect:  Flat  Cognitive:  Appropriate  Insight:  Good  Engagement in Group:  Engaged  Modes of Intervention:  Education  Summary of Progress/Problems: The patient stated that he had a good day since he was able to watch television. His goal for tomorrow is to decrease the voices that he is presently hearing.   Hazle CocaGOODMAN, Caeleb Batalla S 05/03/2016, 9:44 PM

## 2016-05-04 MED ORDER — GABAPENTIN 400 MG PO CAPS
400.0000 mg | ORAL_CAPSULE | Freq: Three times a day (TID) | ORAL | Status: DC
  Administered 2016-05-04 – 2016-05-07 (×9): 400 mg via ORAL
  Filled 2016-05-04 (×14): qty 1

## 2016-05-04 MED ORDER — PERPHENAZINE 2 MG PO TABS
2.0000 mg | ORAL_TABLET | Freq: Every day | ORAL | Status: DC
  Administered 2016-05-04: 2 mg via ORAL
  Filled 2016-05-04 (×3): qty 1

## 2016-05-04 NOTE — [facility] Group Notes (Signed)
[facility] LCSW Group Therapy  05/04/2016 1:38 PM   Type of Therapy:  Group Therapy  Participation Level:  Active  Participation Quality:  Attentive  Affect:  Appropriate  Cognitive:  Appropriate  Insight:  Improving  Engagement in Therapy:  Engaged  Modes of Intervention:  Clarification, Education, Exploration and Socialization  Summary of Progress/Problems: Today's group focused on resilience. Stayed the entire time, minimal engagement.  Stated that he is being resilient here because he is "listening."  Unable to elaborate.  Bruce [name], Bruce [name] 05/04/2016 , 1:38 PM
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
## 2016-05-04 NOTE — Tx Team (Signed)
Interdisciplinary Treatment and Diagnostic Plan Update  05/04/2016 Time of Session: 1:34 PM  Bruce [name] MRN: [id number]  Principal Diagnosis: Schizophrenia [facility])  Secondary Diagnoses: Principal Problem:   Schizophrenia ([place]) Active Problems:   Alcohol use disorder, moderate, dependence ([place])   Morbid obesity ([place])   Hyperprolactinemia ([facility])   Cocaine use disorder, mild, abuse   Cannabis use disorder, mild, abuse   Current Medications:  Current Facility-Administered Medications  Medication Dose Route Frequency Provider Last Rate Last Dose  . acetaminophen (TYLENOL) tablet 650 mg  650 mg Oral Q6H PRN Ursula Alert, MD      . alum & mag hydroxide-simeth (MAALOX/MYLANTA) 200-200-20 MG/5ML suspension 30 mL  30 mL Oral Q4H PRN Saramma Eappen, MD      . benztropine (COGENTIN) tablet 0.5 mg  0.5 mg Oral BID WC Dara Hoyer, PA-C   0.5 mg at 05/04/16 0836  . doxepin (SINEQUAN) capsule 50 mg  50 mg Oral QHS Dara Hoyer, PA-C   50 mg at 05/03/16 2104  . gabapentin (NEURONTIN) capsule 300 mg  300 mg Oral TID Ursula Alert, MD   300 mg at 05/04/16 1213  . risperiDONE (RISPERDAL M-TABS) disintegrating tablet 2 mg  2 mg Oral Q8H PRN Ursula Alert, MD   2 mg at 05/03/16 1428   And  . LORazepam (ATIVAN) tablet 1 mg  1 mg Oral PRN Ursula Alert, MD       And  . ziprasidone (GEODON) injection 20 mg  20 mg Intramuscular PRN Saramma Eappen, MD      . magnesium hydroxide (MILK OF MAGNESIA) suspension 30 mL  30 mL Oral Daily PRN Ursula Alert, MD      . OLANZapine (ZYPREXA) tablet 20 mg  20 mg Oral QHS Dara Hoyer, PA-C   20 mg at 05/03/16 2104  . OLANZapine (ZYPREXA) tablet 5 mg  5 mg Oral [id number] Dara Hoyer, PA-C   5 mg at 05/04/16 1214  . venlafaxine XR (EFFEXOR-XR) 24 hr capsule 225 mg  225 mg Oral Q breakfast Dara Hoyer, PA-C   225 mg at 05/04/16 2263    PTA Medications: Prescriptions Prior to Admission  Medication Sig Dispense Refill Last Dose  . ARIPiprazole  (ABILIFY) 5 MG tablet Take 1 tablet (5 mg total) by mouth every evening. (Patient not taking: Reported on 04/29/2016) 30 tablet 0 Not Taking at Unknown time  . ARIPiprazole 400 MG SUSR Inject 400 mg into the muscle every 28 (twenty-eight) days. Inject 400 mg into the muscle every 28 (twenty-eight) days. Last dose 10/08/2015.  Next dose 11/05/2015. (Patient not taking: Reported on 04/29/2016) 1 each 0 Not Taking at Unknown time  . benztropine (COGENTIN) 0.5 MG tablet Take 1 tablet (0.5 mg total) by mouth 2 (two) times daily with a meal. (Patient not taking: Reported on 04/29/2016) 60 tablet 0 Not Taking at Unknown time  . doxepin (SINEQUAN) 50 MG capsule Take 1 capsule (50 mg total) by mouth at bedtime. (Patient not taking: Reported on 04/29/2016) 30 capsule 0 Not Taking at Unknown time  . gabapentin (NEURONTIN) 100 MG capsule Take 2 capsules (200 mg total) by mouth 3 (three) times daily. (Patient not taking: Reported on 04/29/2016) 180 capsule 0 Not Taking at Unknown time  . metFORMIN (GLUCOPHAGE) 500 MG tablet Take 1 tablet (500 mg total) by mouth daily with breakfast. (Patient not taking: Reported on 04/29/2016) 30 tablet 0 Not Taking at Unknown time  . OLANZapine (ZYPREXA) 20 MG tablet Take 1 tablet (20  mg total) by mouth at bedtime. (Patient not taking: Reported on 04/29/2016) 30 tablet 0 Not Taking at Unknown time  . OLANZapine (ZYPREXA) 5 MG tablet Take 1 tablet (5 mg total) by mouth 2 (two) times daily at 7 am and 12 noon. (Patient not taking: Reported on 04/29/2016) 60 tablet 0 Not Taking at Unknown time  . venlafaxine XR (EFFEXOR-XR) 75 MG 24 hr capsule Take 3 capsules (225 mg total) by mouth daily with breakfast. (Patient not taking: Reported on 04/29/2016) 90 capsule 0 Not Taking at Unknown time    Treatment Modalities: Medication Management, Group therapy, Case management,  1 to 1 session with clinician, Psychoeducation, Recreational therapy.   Physician Treatment Plan for Primary Diagnosis: Schizophrenia  ([place]) Long Term Goal(s): Improvement in symptoms so as ready for discharge  Short Term Goals: Compliance with prescribed medications will improve  Medication Management: Evaluate patient's response, side effects, and tolerance of medication regimen.  Therapeutic Interventions: 1 to 1 sessions, Unit Group sessions and Medication administration.  Evaluation of Outcomes: Met  Physician Treatment Plan for Secondary Diagnosis: Principal Problem:   Schizophrenia ([place]) Active Problems:   Alcohol use disorder, moderate, dependence (HCC)   Morbid obesity (HCC)   Hyperprolactinemia (HCC)   Cocaine use disorder, mild, abuse   Cannabis use disorder, mild, abuse   Long Term Goal(s): Improvement in symptoms so as ready for discharge  Short Term Goals: Ability to identify triggers associated with substance abuse/mental health issues will improve  Medication Management: Evaluate patient's response, side effects, and tolerance of medication regimen.  Therapeutic Interventions: 1 to 1 sessions, Unit Group sessions and Medication administration.  Evaluation of Outcomes: Not Met   RN Treatment Plan for Primary Diagnosis: Schizophrenia ([place]) Long Term Goal(s): Knowledge of disease and therapeutic regimen to maintain health will improve  Short Term Goals: Ability to identify and develop effective coping behaviors will improve and Compliance with prescribed medications will improve  Medication Management: RN will administer medications as ordered by provider, will assess and evaluate patient's response and provide education to patient for prescribed medication. RN will report any adverse and/or side effects to prescribing provider.  Therapeutic Interventions: 1 on 1 counseling sessions, Psychoeducation, Medication administration, Evaluate responses to treatment, Monitor vital signs and CBGs as ordered, Perform/monitor CIWA, COWS, AIMS and Fall Risk screenings as ordered, Perform wound care treatments  as ordered.  Evaluation of Outcomes: Progressing   LCSW Treatment Plan for Primary Diagnosis: Schizophrenia ([place]) Long Term Goal(s): Safe transition to appropriate next level of care at discharge, Engage patient in therapeutic group addressing interpersonal concerns.  Short Term Goals: Engage patient in aftercare planning with referrals and resources  Therapeutic Interventions: Assess for all discharge needs, 1 to 1 time with Social worker, Explore available resources and support systems, Assess for adequacy in community support network, Educate family and significant other(s) on suicide prevention, Complete Psychosocial Assessment, Interpersonal group therapy.  Evaluation of Outcomes: Progressing  Asking for a [facility] with [name] from [facility] program to discuss [facility] options   Progress in Treatment: Attending groups: Yes Participating in groups: Yes Taking medication as prescribed: Yes Toleration medication: Yes, no side effects reported at this time Family/Significant other contact made: No Patient understands diagnosis: Yes AEB asking for help with "all my symptoms" Discussing patient identified problems/goals with staff: Yes Medical problems stabilized or resolved: Yes Denies suicidal/homicidal ideation: Yes Issues/concerns per patient self-inventory: None Other: N/A  New problem(s) identified: None identified at this time.   New Short Term/Long Term Goal(s): None  identified at this time.   Discharge Plan or Barriers:  [place], follow up outpt  Reason for Continuation of Hospitalization: Delusions  Depression Hallucinations Medication stabilization Suicidal ideation   Estimated Length of Stay: 3-5 days  Attendees: Patient: 05/04/2016  1:34 PM  Physician: Ursula Alert, MD 05/04/2016  1:34 PM  Nursing: Hoy Register, RN 05/04/2016  1:34 PM  RN Care Manager: Lars Pinks, RN 05/04/2016  1:34 PM  Social Worker: Ripley Fraise 05/04/2016  1:34 PM   Recreational Therapist: Laretta Bolster  05/04/2016  1:34 PM  Other: Norberto Sorenson 05/04/2016  1:34 PM  Other:  05/04/2016  1:34 PM    Scribe for Treatment Team:  Roque Lias 05/04/2016 1:34 PM

## 2016-05-04 NOTE — Progress Notes (Signed)
Recreation Therapy Notes  Date: 05/04/16 Time: 1000 Location:  500 Hall Group Room  Group Topic: Self-Esteem  Goal Area(s) Addresses:  Patient will identify the importance of recognizing your uniqueness. Patient will verbalize benefit of increased self-esteem.  Intervention: Holiday representativeConstruction paper, magazines, scissors, glue sticks, colored pencils  Activity: Advertising.  LRT went over the concept of advertising and the purpose of it.  Patients were to look through the magazines to find pictures or words that describe and highlight the uniqueness about them.  Patients could also use colored pencils to write words or draw pictures if they couldn't find any in the magazines.   Education:  Self-Esteem, Building control surveyorDischarge Planning.   Education Outcome: Needs additional education  Clinical Observations/Feedback: Pt did not attend group.   Caroll RancherMarjette Lesleyanne Politte, LRT/CTRS         Caroll RancherLindsay, [place] Monds A 05/04/2016 12:08 PM

## 2016-05-04 NOTE — Progress Notes (Signed)
D: Pt  Complains of SI//AHallucinations. Pt is pleasant and cooperative. Pt goal for today is to work on stop hearing voices. A: Pt was offered support and encouragement. Pt was given scheduled medications. Pt was encourage to attend groups. Q 15 minute checks were done for safety.  R:Pt attends groups and interacts well with peers and staff. Pt is taking medication. Pt receptive to treatment and safety maintained on unit.

## 2016-05-04 NOTE — Progress Notes (Signed)
Adult Psychoeducational Group Note  Date:  05/04/2016 Time:  8:45 PM  Group Topic/Focus:  Wrap-Up Group:   The focus of this group is to help patients review their daily goal of treatment and discuss progress on daily workbooks.   Participation Level:  Active  Participation Quality:  Appropriate  Affect:  Appropriate  Cognitive:  Appropriate  Insight: Appropriate  Engagement in Group:  Engaged  Modes of Intervention:  Discussion  Additional Comments:The patient expressed that he had a good day   Octavio Mannshigpen, Cleatus Goodin Lee 05/04/2016, 8:45 PM

## 2016-05-04 NOTE — Progress Notes (Signed)
D: Pt presents with depressed affect and mood on initial contact during medication pass. Pt observed to be animated (laughing, joking) and interactive with staff and peers in milieu. Endorsed + SI, HI and AVH. Per pt "I'm hearing voices telling me to kill myself and others and seeing faces and shadows".  A: Scheduled medications administered as prescribed with verbal education. Emotional support and availability provided to pt. Encouraged to voice needs / concerns. Q 15 minutes safety checks maintained without self harm gestures or outbursts to note thus far. R: Pt receptive to care. Tolerated all PO intake well. Compliant with medications as ordered, denies adverse drug reactions. Attended scheduled groups. Remains safe on and off unit. POC maintained.

## 2016-05-04 NOTE — Progress Notes (Signed)
   D: When asked about his day pt stated, "it's rough been hearing voices real bad, to kill myself. Seeing shadows on the wall. Pt was observed prior to the assessment sitting in the dayroom watching television. Writer did not observe the pt interacting with any of his peers at that time. Pt has no other questions or concerns.   A:  Support and encouragement was offered. 15 min checks continued for safety.  R: Pt remains safe.

## 2016-05-04 NOTE — Progress Notes (Signed)
Patient ID: Bruce [name], male   DOB: [birth year], 40 y.o.   MRN: [id number] [facility] MD Progress Note  05/04/2016 1:57 PM Bruce [name]  MRN:  [id number]  Subjective: Bruce [name] reports, "I'm still hearing voices and I am suicidal."       Objective: Bruce [name] is a 40 year old African-American male,with hx of schizophrenia , who presented to [facility]  with complaints of auditory hallucinations telling him to kill himself & others.  Patient seen and chart reviewed.   Patient continues to report AH ,that is command - asking him to hurt self and others. Pt continues to report SI , with plan to jump off a bridge. Pt reports sleep as restless also has sleep issues , reports sadness and hopelessness. Per staff - pt remains withdrawn and depressed - continues to need encouragement and support.    Principal Problem: Schizophrenia (HCC)  Diagnosis:   Patient Active Problem List   Diagnosis Date Noted  . Cocaine use disorder, mild, abuse [F14.10] 05/03/2016  . Cannabis use disorder, mild, abuse [F12.10] 05/03/2016  . Suicide ideation [R45.851] 04/30/2016  . Paranoid schizophrenia (HCC) [F20.0]   . 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 Alcohol use disorder, moderate, dependence (HCC) [F10.20] 09/22/2015  . Morbid obesity (HCC) [E66.01] 09/22/2015  . Schizophrenia (HCC) [F20.9] 09/21/2015   Total Time spent with patient: 25 minutes  Past Psychiatric History: Please see H&P  Past Medical History:  Past Medical History:  Diagnosis Date  . Depression   . Obesity   . Schizophrenia (HCC)    Family History: Denies hx of HTN, DM, cardiac disease, thyroid disease in family.  Family History  Problem Relation Age of Onset  . Schizophrenia Mother    Family Psychiatric  History: Mother has schizophrenia. Pt denies substance abuse in family.  Social History: Please see H&P.  History  Alcohol Use  . Yes     History  Drug Use  . Types: Marijuana, "Crack" cocaine, Cocaine    Social History    Social History  . Marital status: Single    Spouse name: N/A  . Number of children: N/A  . Years of education: N/A   Social History Main Topics  . Smoking status: Never Smoker  . Smokeless tobacco: Never Used  . Alcohol use Yes  . Drug use:     Types: Marijuana, "Crack" cocaine, Cocaine  . Sexual activity: Not Currently   Other Topics Concern  . None   Social History Narrative  . None   Additional Social History:  Sleep: Poor   Appetite:  Poor  Current Medications: Current Facility-Administered Medications  Medication Dose Route Frequency Provider Last Rate Last Dose  . acetaminophen (TYLENOL) tablet 650 mg  650 mg Oral Q6H PRN Bruce Longs, MD      . alum & mag hydroxide-simeth (MAALOX/MYLANTA) 200-200-20 MG/5ML suspension 30 mL  30 mL Oral Q4H PRN Bruce Leffel, MD      . benztropine (COGENTIN) tablet 0.5 mg  0.5 mg Oral BID WC Bruce Joy, PA-C   0.5 mg at 05/04/16 0836  . doxepin (SINEQUAN) capsule 50 mg  50 mg Oral QHS Bruce Joy, PA-C   50 mg at 05/03/16 2104  . gabapentin (NEURONTIN) capsule 300 mg  300 mg Oral TID Bruce Longs, MD   300 mg at 05/04/16 1213  . risperiDONE (RISPERDAL M-TABS) disintegrating tablet 2 mg  2 mg Oral Q8H PRN Bruce Longs, MD   2 mg at 05/03/16 1428   And  .  LORazepam (ATIVAN) tablet 1 mg  1 mg Oral PRN Bruce Longs, MD       And  . ziprasidone (GEODON) injection 20 mg  20 mg Intramuscular PRN Bruce Vanrossum, MD      . magnesium hydroxide (MILK OF MAGNESIA) suspension 30 mL  30 mL Oral Daily PRN Bruce Longs, MD      . OLANZapine (ZYPREXA) tablet 20 mg  20 mg Oral QHS Bruce Joy, PA-C   20 mg at 05/03/16 2104  . OLANZapine (ZYPREXA) tablet 5 mg  5 mg Oral BH-[id number] Bruce Joy, PA-C   5 mg at 05/04/16 1214  . perphenazine (TRILAFON) tablet 2 mg  2 mg Oral QHS Bruce Verastegui, MD      . venlafaxine XR (EFFEXOR-XR) 24 hr capsule 225 mg  225 mg Oral Q breakfast Bruce Joy, PA-C   225 mg at 05/04/16 1610    Lab Results:  No results found for this or any previous visit (from the past 48 hour(s)).  Physical Findings: AIMS: Facial and Oral Movements Muscles of Facial Expression: None, normal Lips and Perioral Area: None, normal Jaw: None, normal Tongue: None, normal,Extremity Movements Upper (arms, wrists, hands, fingers): None, normal Lower (legs, knees, ankles, toes): None, normal, Trunk Movements Neck, shoulders, hips: None, normal, Overall Severity Severity of abnormal movements (highest score from questions above): None, normal Incapacitation due to abnormal movements: None, normal Patient's awareness of abnormal movements (rate only patient's report): No Awareness, Dental Status Current problems with teeth and/or dentures?: No Does patient usually wear dentures?: No  CIWA:    COWS:     Musculoskeletal: Strength & Muscle Tone: within normal limits Gait & Station: normal Patient leans: N/A  Psychiatric Specialty Exam: Review of Systems  Psychiatric/Behavioral: Positive for depression, hallucinations, substance abuse and suicidal ideas. The patient is nervous/anxious and has insomnia.   All other systems reviewed and are negative.   Blood pressure 119/61, pulse 77, temperature 97.7 F (36.5 C), temperature source Oral, resp. rate 16, height 5\' 8"  (1.727 m), weight (!) 147.4 kg (325 lb).Body mass index is 49.42 kg/m.  General Appearance: Casual  Eye Contact::  Fair  Speech:  Clear and Coherent  Volume:  Normal  Mood:  Anxious and Depressed  Affect:  Congruent   Thought Process:  Coherent  Orientation:  Full (Time, Place, and Person)  Thought Content:  Hallucinations: Auditory, Paranoid Ideation and Rumination command - AH asking him to kill self  Suicidal Thoughts:  Yes.  with intent/plan  Homicidal Thoughts:  No but is hearing voices asking him to kill someone  Memory:  Immediate;   Fair Recent;   Fair Remote;   Fair  Judgement:  Impaired  Insight:  Shallow   Psychomotor Activity:  Normal  Concentration:  Fair  Recall:  [employer] of Knowledge:Fair  Language: Fair  Akathisia:  No  Handed:  Right  AIMS (if indicated):     Assets:  Others:  access to healthcare  ADL's:  Intact  Cognition: WNL  Sleep:  Number of Hours: 6.75   Treatment Plan Summary: Steel is a 40 year old African-American male,who has a hx of alcohol abuse, schizophrenia, who presented to [facility] with worsening psychosis and SI. Pt today continues to report depression, psychosis and SI with plan .   Patient continues to be depressed , psychotic and reports sleep issues - will continue treatment.   Daily contact with patient to assess and evaluate symptoms and progress in treatment and Medication  management  Will continue Zyprexa 5 mg po bid and 20 mg po qhs for psychosis. Will add Trilafon 2 mg po qhs for psychosis, augment zyprexa. Will continue Effexor xr 225 mg po daily with breakfast for affective sx. Will continue Doxepin 50 mg po qhs for sleep. Will increase Gabapentin to 400 mg po tid for anxiety sx. Will make available PRN medications as per agitation protocol.  Reviewed past medical records,treatment plan.  Will continue to monitor vitals ,medication compliance and treatment side effects while patient is here.  Will monitor for medical issues as well as call consult as needed.  Reviewed labs PL - elevated - 09/24/15 - will reorder , hba1c-5.4( 09/27/15)  , lipid panel - 09/26/15- wnl , tsh -wnl ( 09/26/15)   ,uds - cocaine, thc , pending ekg for qtc . CSW will continue working on disposition.  Patient to participate in therapeutic milieu .      Bruce Surratt MD 05/04/2016, 1:57 PM

## 2016-05-05 MED ORDER — INFLUENZA VAC SPLIT QUAD 0.5 ML IM SUSY
0.5000 mL | PREFILLED_SYRINGE | INTRAMUSCULAR | Status: AC
  Administered 2016-05-05: 0.5 mL via INTRAMUSCULAR
  Filled 2016-05-05: qty 0.5

## 2016-05-05 MED ORDER — PERPHENAZINE 4 MG PO TABS
4.0000 mg | ORAL_TABLET | Freq: Every day | ORAL | Status: DC
  Administered 2016-05-05 – 2016-05-06 (×2): 4 mg via ORAL
  Filled 2016-05-05 (×4): qty 1

## 2016-05-05 NOTE — Progress Notes (Signed)
   D: Pt asked the writer if his dr had a chance to increase his medications. Stated, "I'm ready for the voices to go away". When informed that his voices may not ever completely go away, the pt stated, "want them to die down a little". Writer informed that that is a reasonable explanation, and that hopefully he will be taught how to cope with the voices. Pt has no other questions or concerns.   A:  Support and encouragement was offered. 15 min checks continued for safety.  R: Pt remains safe.

## 2016-05-05 NOTE — Progress Notes (Signed)
Adult Psychoeducational Group Note  Date:  05/05/2016 Time:  9:16 PM  Group Topic/Focus:  Wrap-Up Group:   The focus of this group is to help patients review their daily goal of treatment and discuss progress on daily workbooks.   Participation Level:  Active  Participation Quality:  Appropriate  Affect:  Appropriate  Cognitive:  Alert  Insight: Appropriate  Engagement in Group:  Engaged  Modes of Intervention:  Discussion  Additional Comments:  Patient states, "I had an alright day". Patient goal for today was to not stress as much.  Bruce [name] 05/05/2016, 9:16 PM

## 2016-05-05 NOTE — Progress Notes (Signed)
Nursing Note 05/05/2016 [id number]  Data Reports sleeping good.  Did not complete patient self-inventory sheet.  C/O ongoing depression and anxiety.  C/O voices telling him to harm himself and sees shadows he says he knows aren't there.  Goal today initially was to "get on the right medicine."  Patient continues to wear hospital gown "because it's more comfortable."    Action Spoke with patient 1:1, nurse offered support to patient throughout shift.  Spoke with patient about coping, asked patient to approach desk if he was having trouble with voices or SI, encouraged to start working on some coping strategies and nurse explained medication as only a component of his recovery.  Nurse also explained that patient might struggle with depression, voices, and passive SI after he is discharged, and the medication therapy is an adjunct to help reduce symptoms. Patient spoke with tech, Lupita Leashonna, while he was hearing voices- Lupita LeashDonna reported patient was taught some grounding techniques and tech sat with patient for some time to assist him.  Continues to be monitored on 15 minute checks for safety.  Response Patient stated grounding techniques helped "a little." Stated he feels "about the same today," and "not good" throughout day.  Attending groups.  Minimal interaction with staff and peers.

## 2016-05-05 NOTE — [facility] Group Notes (Signed)
[facility] LCSW Group Therapy  05/05/2016 2:28 PM   Type of Therapy:  Group Therapy   Participation Level:  Engaged  Participation Quality:  Attentive  Affect:  Appropriate   Cognitive:  Alert   Insight:  Engaged  Engagement in Therapy:  Improving   Modes of Intervention:  Education, Exploration, Socialization   Summary of Progress/Problems: Bruce [name] was in attendance, but left shortly after Bruce [name] began his presentation.   Bruce [name] from [employer] was here to tell his story of recovery, inform patients about [employer] and play his guitar.   Bruce [name] 05/05/2016 2:28 PM

## 2016-05-05 NOTE — Progress Notes (Signed)
Patient ID: Bruce [name] Scerbo, male   DOB: [birth year], 40 y.o.   [id number] [facility]  05/05/2016 1:56 PM Bruce [name] Puga  MRN:  [id number]  Subjective: [name] reports, "I'm still hearing voices and seeing things and I am suicidal.'        Objective: [name] is a 40 year old African-American male,with hx of schizophrenia , [facility]  with complaints of auditory hallucinations telling him to kill himself & others.  Patient seen and chart reviewed.   Patient continues to report AH ,that is command - asking him to hurt self and others.Pt reports his medications are not helpful.Pt wants his medications readjusted . Pt continues to report SI , with plan to jump off a bridge. Pt reports sleep as restless also has sleep issues. Pt per staff - seen on the unit often as laughing and joking with peers which is inconsistent with his subjective report of his sx. Pt per review of EHR has a hx of using hospital stay for secondary gains and reporting extreme sx while on the unit irrespective of multiple medication changes. Will continue to observe.      Principal Problem: Schizophrenia (HCC)  Diagnosis:   Patient Active Problem List   Diagnosis Date Noted  . Cocaine use disorder, mild, abuse [F14.10] 05/03/2016  . Cannabis use disorder, mild, abuse [F12.10] 05/03/2016  . Suicide ideation [R45.851] 04/30/2016  . Paranoid schizophrenia (HCC) [F20.0]   . 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 Alcohol use disorder, moderate, dependence (HCC) [F10.20] 09/22/2015  . Morbid obesity (HCC) [E66.01] 09/22/2015  . Schizophrenia (HCC) [F20.9] 09/21/2015   Total Time spent with patient: 25 minutes  Past Psychiatric History: Please see H&P  Past Medical History:  Past Medical History:  Diagnosis Date  . Depression   . Obesity   . Schizophrenia (HCC)    Family History: Denies hx of HTN, DM, cardiac disease, thyroid disease in family.  Family History  Problem  Relation Age of Onset  . Schizophrenia Mother    Family Psychiatric  History: Mother has schizophrenia. Pt denies substance abuse in family.  Social History: Please see H&P.  History  Alcohol Use  . Yes     History  Drug Use  . Types: Marijuana, "Crack" cocaine, Cocaine    Social History   Social History  . Marital status: Single    Spouse name: N/A  . Number of children: N/A  . Years of education: N/A   Social History Main Topics  . Smoking status: Never Smoker  . Smokeless tobacco: Never Used  . Alcohol use Yes  . Drug use:     Types: Marijuana, "Crack" cocaine, Cocaine  . Sexual activity: Not Currently   Other Topics Concern  . None   Social History Narrative  . None   Additional Social History:  Sleep: Poor   Appetite:  Poor  Current Medications: Current Facility-Administered Medications  Medication Dose Route Frequency Provider Last Rate Last Dose  . acetaminophen (TYLENOL) tablet 650 mg  650 mg Oral Q6H PRN Jomarie LongsSaramma Daimion Adamcik, MD      . alum & mag hydroxide-simeth (MAALOX/MYLANTA) 200-200-20 MG/5ML suspension 30 mL  30 mL Oral Q4H PRN March Joos, MD      . benztropine (COGENTIN) tablet 0.5 mg  0.5 mg Oral BID WC Court Joyharles E Kober, PA-C   0.5 mg at 05/05/16 [id number]  . doxepin (SINEQUAN) capsule 50 mg  50 mg Oral QHS Court Joyharles E Kober, PA-C   50 mg at  05/04/16 2120  . gabapentin (NEURONTIN) capsule 400 mg  400 mg Oral TID Jomarie Longs, MD   400 mg at 05/05/16 1152  . Influenza vac split quadrivalent PF (FLUARIX) injection 0.5 mL  0.5 mL Intramuscular Tomorrow-1000 Skyeler Smola, MD      . risperiDONE (RISPERDAL M-TABS) disintegrating tablet 2 mg  2 mg Oral Q8H PRN Jomarie Longs, MD   2 mg at 05/03/16 1428   And  . LORazepam (ATIVAN) tablet 1 mg  1 mg Oral PRN Jomarie Longs, MD       And  . ziprasidone (GEODON) injection 20 mg  20 mg Intramuscular PRN Coltyn Hanning, MD      . magnesium hydroxide (MILK OF MAGNESIA) suspension 30 mL  30 mL Oral Daily PRN  Jomarie Longs, MD      . OLANZapine (ZYPREXA) tablet 20 mg  20 mg Oral QHS Court Joy, PA-C   20 mg at 05/04/16 2121  . OLANZapine (ZYPREXA) tablet 5 mg  5 mg Oral [id number] Court Joy, PA-C   5 mg at 05/05/16 1152  . perphenazine (TRILAFON) tablet 2 mg  2 mg Oral QHS Jomarie Longs, MD   2 mg at 05/04/16 2120  . venlafaxine XR (EFFEXOR-XR) 24 hr capsule 225 mg  225 mg Oral Q breakfast Court Joy, PA-C   225 mg at 05/05/16 1610   Lab Results:  No results found for this or any previous visit (from the past 48 hour(s)).  Physical Findings: AIMS: Facial and Oral Movements Muscles of Facial Expression: None, normal Lips and Perioral Area: None, normal Jaw: None, normal Tongue: None, normal,Extremity Movements Upper (arms, wrists, hands, fingers): None, normal Lower (legs, knees, ankles, toes): None, normal, Trunk Movements Neck, shoulders, hips: None, normal, Overall Severity Severity of abnormal movements (highest score from questions above): None, normal Incapacitation due to abnormal movements: None, normal Patient's awareness of abnormal movements (rate only patient's report): No Awareness, Dental Status Current problems with teeth and/or dentures?: No Does patient usually wear dentures?: No  CIWA:    COWS:     Musculoskeletal: Strength & Muscle Tone: within normal limits Gait & Station: normal Patient leans: N/A  Psychiatric Specialty Exam: Review of Systems  Psychiatric/Behavioral: Positive for depression, hallucinations, substance abuse and suicidal ideas. The patient is nervous/anxious and has insomnia.   All other systems reviewed and are negative.   Blood pressure (!) 142/88, pulse 95, temperature 97.4 F (36.3 C), temperature source Oral, resp. rate 18, height 5\' 8"  (1.727 m), weight (!) 147.4 kg (325 lb).Body mass index is 49.42 kg/m.  General Appearance: Casual  Eye Contact::  Fair  Speech:  Clear and Coherent  Volume:  Normal  Mood:  Anxious and  Depressed  Affect:  Congruent - however staff reports that pt is often observed on the unit as laughing and joking and not really depressed.  Thought Process:  Coherent  Orientation:  Full (Time, Place, and Person)  Thought Content:  Hallucinations: Auditory, Paranoid Ideation and Rumination command - AH asking him to kill self  Suicidal Thoughts:  Yes.  with intent/plan  Homicidal Thoughts:  No but is hearing voices asking him to kill someone  Memory:  Immediate;   Fair Recent;   Fair Remote;   Fair  Judgement:  Impaired  Insight:  Shallow  Psychomotor Activity:  Normal  Concentration:  Fair  Recall:  [employer] of Knowledge:Fair  Language: Fair  Akathisia:  No  Handed:  Right  AIMS (if indicated):  Assets:  Others:  access to healthcare  ADL's:  Intact  Cognition: WNL  Sleep:  Number of Hours: 5.5   Treatment Plan Summary: Shayden is a 40 year old African-American male,who has a hx of alcohol abuse, schizophrenia, who presented to [facility] with worsening psychosis and SI. Pt today continues to report depression, psychosis and SI with plan .   Patient continues to be depressed , psychotic and reports sleep issues - will continue treatment.   Daily contact with patient to assess and evaluate symptoms and progress in treatment and Medication management  Will continue Zyprexa 5 mg po bid and 20 mg po qhs for psychosis. Will increase Trilafon to 4 mg po qhs for psychosis, augment zyprexa. Will continue Effexor xr 225 mg po daily with breakfast for affective sx. Will continue Doxepin 50 mg po qhs for sleep. Increased Gabapentin to 400 mg po tid for anxiety sx. Will make available PRN medications as per agitation protocol.  Reviewed past medical records,treatment plan.  Will continue to monitor vitals ,medication compliance and treatment side effects while patient is here.  Will monitor for medical issues as well as call consult as needed.  Reviewed labs PL - elevated - 09/24/15 -  will reorder , hba1c-5.4( 09/27/15)  , lipid panel - 09/26/15- wnl , tsh -wnl ( 09/26/15)   ,uds - cocaine, thc , pending ekg for qtc. CSW will continue working on disposition.  Patient to participate in therapeutic milieu .      Gregary Blackard MD 05/05/2016, 1:56 PM

## 2016-05-05 NOTE — Progress Notes (Signed)
Recreation Therapy Notes  Date: 05/05/16 Time: 1000 Location: [address] Dayroom  Group Topic: Coping Skills  Goal Area(s) Addresses:  Patient will be able to identify positive coping skills. Patient will be able to identify how using positive coping skills will help them post d/c.  Behavioral Response: Engaged  Intervention: Worksheet, colored pencils  Activity: OrthoptistWeb Design.  Patients were given a worksheet with a Orthoptistweb design.  Patients were to identify the situations they are facing that have them "stuck" and write them within the lines of the web.  Patients were then asked to come up with coping skills for each of the situations they are facing.  Education: PharmacologistCoping Skills, Building control surveyorDischarge Planning.   Education Outcome: Acknowledges understanding/In group clarification offered/Needs additional education.   Clinical Observations/Feedback: Pt stated he was dealing with suicidal and homicidal thoughts and anger.  Pt identified his coping skills as watching television and get out more.  He stated this would help him "learn to listen more".  LRT also encouraged pt to work on identifying more coping skills.       Caroll RancherMarjette Jozeph Persing, LRT/CTRS     Caroll RancherLindsay, Braylinn Gulden A 05/05/2016 12:41 PM

## 2016-05-06 NOTE — Progress Notes (Signed)
Patient ID: Bruce [name], male   DOB: [birth year], 40 y.o.   MRN: [id number] [facility] MD Progress Note  05/06/2016 2:35 PM Bruce [name]  MRN:  [id number]  Subjective: Bruce [name] reports, "I'm still hearing voices and I am suicidal.'         Objective: Bruce [name] is a 39 year old African-American male,with hx of schizophrenia , who presented to [facility]  with complaints of auditory hallucinations telling him to kill himself & others.  Patient seen and chart reviewed.   Patient continues to report AH ,that is command - asking him to hurt self and others. Pt continues to report SI ,did not mention any plans today. Pt per staff - seen on the unit as participating in groups , interacting well with staff. Patient continues to report " I am not ready , I need more time.' Pt per review of EHR has a hx of using hospital stay for secondary gains and reporting extreme sx while on the unit irrespective of multiple medication changes. Will continue to observe.      Principal Problem: Schizophrenia (HCC)  Diagnosis:   Patient Active Problem List   Diagnosis Date Noted  . Cocaine use disorder, mild, abuse [F14.10] 05/03/2016  . Cannabis use disorder, mild, abuse [F12.10] 05/03/2016  . Suicide ideation [R45.851] 04/30/2016  . Paranoid schizophrenia (HCC) [F20.0]   . 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 Alcohol use disorder, moderate, dependence (HCC) [F10.20] 09/22/2015  . Morbid obesity (HCC) [E66.01] 09/22/2015  . Schizophrenia (HCC) [F20.9] 09/21/2015   Total Time spent with patient: 20 minutes  Past Psychiatric History: Please see H&P  Past Medical History:  Past Medical History:  Diagnosis Date  . Depression   . Obesity   . Schizophrenia (HCC)    Family History: Denies hx of HTN, DM, cardiac disease, thyroid disease in family.  Family History  Problem Relation Age of Onset  . Schizophrenia Mother    Family Psychiatric  History: Mother has schizophrenia. Pt denies substance abuse  in family.  Social History: Please see H&P.  History  Alcohol Use  . Yes     History  Drug Use  . Types: Marijuana, "Crack" cocaine, Cocaine    Social History   Social History  . Marital status: Single    Spouse name: N/A  . Number of children: N/A  . Years of education: N/A   Social History Main Topics  . Smoking status: Never Smoker  . Smokeless tobacco: Never Used  . Alcohol use Yes  . Drug use:     Types: Marijuana, "Crack" cocaine, Cocaine  . Sexual activity: Not Currently   Other Topics Concern  . None   Social History Narrative  . None   Additional Social History:  Sleep: Poor per report - but observed as 5.5 hrs  Appetite:  Poor per report - observed as fair  Current Medications: Current Facility-Administered Medications  Medication Dose Route Frequency Provider Last Rate Last Dose  . acetaminophen (TYLENOL) tablet 650 mg  650 mg Oral Q6H PRN Jomarie Longs, MD      . alum & mag hydroxide-simeth (MAALOX/MYLANTA) 200-200-20 MG/5ML suspension 30 mL  30 mL Oral Q4H PRN Mickala Laton, MD      . benztropine (COGENTIN) tablet 0.5 mg  0.5 mg Oral BID WC Bruce Joy, PA-C   0.5 mg at 05/06/16 0803  . doxepin (SINEQUAN) capsule 50 mg  50 mg Oral QHS Bruce Joy, PA-C   50 mg at 05/05/16 2058  . gabapentin (  NEURONTIN) capsule 400 mg  400 mg Oral TID Jomarie LongsSaramma Ayham Word, MD   400 mg at 05/06/16 1212  . risperiDONE (RISPERDAL M-TABS) disintegrating tablet 2 mg  2 mg Oral Q8H PRN Jomarie LongsSaramma Zacaria Pousson, MD   2 mg at 05/03/16 1428   And  . LORazepam (ATIVAN) tablet 1 mg  1 mg Oral PRN Jomarie LongsSaramma Sabriah Hobbins, MD       And  . ziprasidone (GEODON) injection 20 mg  20 mg Intramuscular PRN Treysean Petruzzi, MD      . magnesium hydroxide (MILK OF MAGNESIA) suspension 30 mL  30 mL Oral Daily PRN Jomarie LongsSaramma Sherina Stammer, MD      . OLANZapine (ZYPREXA) tablet 20 mg  20 mg Oral QHS Bruce Joyharles E Kober, PA-C   20 mg at 05/05/16 2058  . OLANZapine (ZYPREXA) tablet 5 mg  5 mg Oral [id number] Bruce Joyharles E Kober,  PA-C   5 mg at 05/06/16 1212  . perphenazine (TRILAFON) tablet 4 mg  4 mg Oral QHS Jomarie LongsSaramma Iysis Germain, MD   4 mg at 05/05/16 2058  . venlafaxine XR (EFFEXOR-XR) 24 hr capsule 225 mg  225 mg Oral Q breakfast Bruce Joyharles E Kober, PA-C   225 mg at 05/06/16 [id number]   Lab Results:  No results found for this or any previous visit (from the past 48 hour(s)).  Physical Findings: AIMS: Facial and Oral Movements Muscles of Facial Expression: None, normal Lips and Perioral Area: None, normal Jaw: None, normal Tongue: None, normal,Extremity Movements Upper (arms, wrists, hands, fingers): None, normal Lower (legs, knees, ankles, toes): None, normal, Trunk Movements Neck, shoulders, hips: None, normal, Overall Severity Severity of abnormal movements (highest score from questions above): None, normal Incapacitation due to abnormal movements: None, normal Patient's awareness of abnormal movements (rate only patient's report): No Awareness, Dental Status Current problems with teeth and/or dentures?: No Does patient usually wear dentures?: No  CIWA:    COWS:     Musculoskeletal: Strength & Muscle Tone: within normal limits Gait & Station: normal Patient leans: N/A  Psychiatric Specialty Exam: Review of Systems  Psychiatric/Behavioral: Positive for depression, hallucinations, substance abuse and suicidal ideas. The patient is nervous/anxious and has insomnia.   All other systems reviewed and are negative.   Blood pressure 125/68, pulse 99, temperature 98.4 F (36.9 C), temperature source Oral, resp. rate 20, height 5\' 8"  (1.727 m), weight (!) 147.4 kg (325 lb).Body mass index is 49.42 kg/m.  General Appearance: Casual  Eye Contact::  Fair  Speech:  Clear and Coherent  Volume:  Normal  Mood:  Anxious and Depressed   Affect:  Congruent - however staff reports that pt is often observed on the unit as laughing and joking and not really depressed.  Thought Process:  Coherent  Orientation:  Full (Time,  Place, and Person)  Thought Content:  Hallucinations: Auditory, Paranoid Ideation and Rumination command - AH asking him to kill self  Suicidal Thoughts:  Yes.  with intent/plan  Homicidal Thoughts:  No but is hearing voices asking him to kill someone  Memory:  Immediate;   Fair Recent;   Fair Remote;   Fair  Judgement:  Impaired  Insight:  Shallow  Psychomotor Activity:  Normal  Concentration:  Fair  Recall:  [employer]  Fund of Knowledge:Fair  Language: Fair  Akathisia:  No  Handed:  Right  AIMS (if indicated):     Assets:  Others:  access to healthcare  ADL's:  Intact  Cognition: WNL  Sleep:  Number of Hours: 5.5   Treatment  Plan Summary: Bruce [name] is a 40 year old African-American male,who has a hx of alcohol abuse, schizophrenia, who presented to [facility] with worsening psychosis and SI. Pt today continues to report depression, psychosis and SI with plan .   Patient continues to be depressed , psychotic and reports sleep issues - will continue treatment.   Daily contact with patient to assess and evaluate symptoms and progress in treatment and Medication management  Will continue Zyprexa 5 mg po bid and 20 mg po qhs for psychosis. Will continue Trilafon to 4 mg po qhs for psychosis, augment zyprexa. Will continue Effexor xr 225 mg po daily with breakfast for affective sx. Will continue Doxepin 50 mg po qhs for sleep. Increased Gabapentin to 400 mg po tid for anxiety sx. Will make available PRN medications as per agitation protocol.  Reviewed past medical records,treatment plan.  Will continue to monitor vitals ,medication compliance and treatment side effects while patient is here.  Will monitor for medical issues as well as call consult as needed.  Reviewed labs PL - elevated - 09/24/15 - will reorder , hba1c-5.4( 09/27/15)  , lipid panel - 09/26/15- wnl , tsh -wnl ( 09/26/15)   ,uds - cocaine, thc ,  ekg for qtc- wnl . CSW will continue working on disposition.  Patient to participate in  therapeutic milieu .      Lolah Coghlan MD 05/06/2016, 2:35 PM

## 2016-05-06 NOTE — [facility] Group Notes (Signed)
Type of Therapy: Process Group Therapy  Participation Level:  Active  Participation Quality:  Appropriate  Affect:  Flat  Cognitive:  Oriented  Insight:  Improving  Engagement in Group:  Limited  Engagement in Therapy:  Limited  Modes of Intervention:  Activity, Clarification, Education, Problem-solving and Support  Summary of Progress/Problems: Today's group addressed balance in life. Patients participated in the discussion about when their life was in balance and out of balance and how this feels. Patients discussed ways to get back in balance and short term goals they can work on to get where they want to be.  Bruce [name] stated that he became unbalanced when his grandmother passed away in February. Patient stated that he sometimes see his grandmother in her casket. Patient stated that speaking to his mother helps him regain balance in life. Bruce [name] also shared that he has been practicing distinguishing between reality and hallucinations.

## 2016-05-06 NOTE — Progress Notes (Signed)
Recreation Therapy Notes  Date: 05/06/16 Time: 1000 Location: 500 Hall Dayroom  Group Topic: Communication, Team Building, Problem Solving  Goal Area(s) Addresses:  Patient will effectively work with peer towards shared goal.  Patient will identify skill used to make activity successful.  Patient will identify how skills used during activity can be used to reach post d/c goals.   Intervention: STEM Activity   Activity: [place]. Patients were provided the following materials: 5 drinking straws, 5 rubber bands, 5 paper clips, 2 index cards, 2 drinking cups, and 2 toilet paper rolls. Using the provided materials patients were asked to build a launching mechanisms to launch a ping pong ball approximately 12 feet. Patients were divided into teams of 3-5.   Education: Pharmacist, communityocial Skills, Building control surveyorDischarge Planning.   Education Outcome: Needs additional education.   Clinical Observations/Feedback: Pt did not attend group.   Caroll RancherMarjette Kysean Sweet, LRT/CTRS         Caroll RancherLindsay, Sherl Yzaguirre A 05/06/2016 12:20 PM

## 2016-05-06 NOTE — Progress Notes (Signed)
Nursing Note 05/06/2016 [id number]  Data Reports sleeping good without PRN sleep med.  Rates depression 6/10, hopelessness 8/10, and anxiety 6/10. Affect blunted, mood "better."  Patient states he feels little better today, says voices have "died down, they're not completely gone," and he is feeling a little less depressed.  Still hears chronic command hallucinations to harm himself, and still sees auditory hallucinations of shadows.  Denies HI. Pleasant, smiles when nurse jokes with him.  Attending groups, spends free time in day area.  Minimal interaction, does not initiate.   Denies physical complaints.    Action Spoke with patient 1:1, nurse offered support to patient throughout shift.  Continues to be monitored on 15 minute checks for safety.  Response Remains safe, though somewhat withdrawn in the milieu throughout the day.  Patient reported additionally feeling better this evening than he did this morning.  He was talking about discharge tomorrow and making phone calls.

## 2016-05-07 ENCOUNTER — Encounter (HOSPITAL_COMMUNITY): Payer: Self-pay | Admitting: Psychiatry

## 2016-05-07 LAB — PROLACTIN: Prolactin: 19.9 ng/mL — ABNORMAL HIGH (ref 4.0–15.2)

## 2016-05-07 MED ORDER — VENLAFAXINE HCL ER 75 MG PO CP24: 225.0000 mg | ORAL_CAPSULE | Freq: Every day | ORAL | 0 refills | Status: DC

## 2016-05-07 MED ORDER — BENZTROPINE MESYLATE 0.5 MG PO TABS: 0.5000 mg | ORAL_TABLET | Freq: Two times a day (BID) | ORAL | 0 refills | Status: DC

## 2016-05-07 MED ORDER — PERPHENAZINE 4 MG PO TABS: 4.0000 mg | ORAL_TABLET | Freq: Every day | ORAL | 0 refills | Status: DC

## 2016-05-07 MED ORDER — DOXEPIN HCL 50 MG PO CAPS: 50.0000 mg | ORAL_CAPSULE | Freq: Every day | ORAL | 0 refills | Status: DC

## 2016-05-07 MED ORDER — OLANZAPINE 5 MG PO TABS: 5.0000 mg | ORAL_TABLET | ORAL | 0 refills | Status: DC

## 2016-05-07 MED ORDER — OLANZAPINE 20 MG PO TABS: 20.0000 mg | ORAL_TABLET | Freq: Every day | ORAL | 0 refills | Status: DC

## 2016-05-07 MED ORDER — GABAPENTIN 400 MG PO CAPS: 400.0000 mg | ORAL_CAPSULE | Freq: Three times a day (TID) | ORAL | 0 refills | Status: DC

## 2016-05-07 NOTE — [facility] Suicide Risk Assessment (Signed)
[facility] INPATIENT:  Family/Significant Other Suicide Prevention Education  Suicide Prevention Education:  Patient Refusal for Family/Significant Other Suicide Prevention Education: The patient Bruce [name] has refused to provArlan Organide written consent for family/significant other to be provided Family/Significant Other Suicide Prevention Education during admission and/or prior to discharge.  Physician notified.  [name] B [facility] 05/07/2016, 10:54 AM

## 2016-05-07 NOTE — [facility] Suicide Risk Assessment (Signed)
[facility] Discharge Suicide Risk Assessment   Principal Problem: Schizophrenia [facility]) Discharge Diagnoses:  Patient Active Problem List   Diagnosis Date Noted  . Cocaine use disorder, mild, abuse [F14.10] 05/03/2016  . Cannabis use disorder, mild, abuse [F12.10] 05/03/2016  . Suicide ideation [R45.851] 04/30/2016  . Paranoid schizophrenia (HCC) [F20.0]   . 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 Alcohol use disorder, moderate, dependence (HCC) [F10.20] 09/22/2015  . Morbid obesity (HCC) [E66.01] 09/22/2015  . Schizophrenia (HCC) [F20.9] 09/21/2015    Total Time spent with patient: 30 minutes  Musculoskeletal: Strength & Muscle Tone: within normal limits Gait & Station: normal Patient leans: N/A  Psychiatric Specialty Exam: Review of Systems  Psychiatric/Behavioral: Positive for suicidal ideas (chronic SI). Negative for depression.  All other systems reviewed and are negative.   Blood pressure 129/79, pulse (!) 118, temperature 98.8 F (37.1 C), temperature source Oral, resp. rate (!) 28, height 5\' 8"  (1.727 m), weight (!) 147.4 kg (325 lb).Body mass index is 49.42 kg/m.  General Appearance: Casual  Eye Contact::  Fair  Speech:  Normal Rate409  Volume:  Normal  Mood:  PATIENT REPORTS HE IS DEPRESSED - BUT IS OBSERVED AS INTERACTING WITH OTHER STAFF AND PEERS AS SMILING , LAUGHING  Affect:  Appropriate  Thought Process:  Goal Directed and Descriptions of Associations: Intact  Orientation:  Full (Time, Place, and Person)  Thought Content:  Logical NOT SEEN AS RESPONDING TO INTERNAL STIMULI , IS NOT DISORGANIZED - ALTHOUGH HE IS ALWAYS ENDORSING CHRONIC AH/VH   Suicidal Thoughts:  PATIENT HAS CHRONIC SI - DENIES PLAN  Homicidal Thoughts:  No  Memory:  Immediate;   Fair Recent;   Fair Remote;   Fair  Judgement:  Fair  Insight:  Shallow  Psychomotor Activity:  Normal  Concentration:  Fair  Recall:  [employer] of Knowledge:Fair  Language: Fair  Akathisia:  No  Handed:   Right  AIMS (if indicated):     Assets:  Desire for Improvement  Sleep:  Number of Hours: 6.75  Cognition: WNL  ADL's:  Intact   Mental Status Per Nursing Assessment::   On Admission:     Demographic Factors:  Male  Loss Factors: NA  Historical Factors: Impulsivity  Risk Reduction Factors:   Positive therapeutic relationship  Continued Clinical Symptoms:  Previous Psychiatric Diagnoses and Treatments  Cognitive Features That Contribute To Risk:  Polarized thinking     Patient continues to report AH , but states he knows they are getting better. Pt continues to report passive SI , which is chronic. Pt is inconsistent with his report of symptoms - reported to nursing he is sleeping well and to writer this AM that he is not. Pt has had several medication changes - pt always however states he is symptomatic and states that he is suicidal.Pt has presented with extreme symptomatology since admission , although multiple medication changes were done. Pt on the unit is observed as not responding to internal stimuli , is not observed as though he is in distress from his AH/VH . Pt is attending groups , seen as taking care of his ADLS , his appetite is good , he is observed as sleeping without any distress , eventhough he always rates his depression as high.  Hence it appears that there is significant discrepancies between the subjective presentation and objective sx .     Suicide Risk:   Acute risk : Minimal: Pt with chronic SI - persisting , has not exhibited any self  injurious behavior on the unit.Pt is currently stable on medications , does not have access to weapons, has after care referrals made for continued support outside , denies family hx of suicide.      Suicide Risk:  Acute risk for suicide : Minimal: No identifiable suicidal ideation.  Patients presenting with no risk factors but with morbid ruminations; may be classified as minimal risk based on the severity  of the depressive symptoms    Plan Of Care/Follow-up recommendations:  Activity:  no restrictions Other:  none  Lacresha Fusilier, MD 05/07/2016, 10:03 AM

## 2016-05-07 NOTE — Progress Notes (Signed)
Recreation Therapy Notes  Date: 05/07/16 Time: 1000 Location: 500 Hall Dayroom  Group Topic: Stress Management  Goal Area(s) Addresses:  Patient will verbalize importance of using healthy stress management.  Patient will identify positive emotions associated with healthy stress management.   Intervention: Deep Breathing, Guided Imagery  Activity :  Deep Breathing, Depression Imagery.  LRT introduced the stress management techniques of deep breathing and guided imagery.  LRT read scripts aloud to allow patients to guide patients through the techniques. Patients were to follow along as LRT read scripts to participate in the techniques.  Education: Stress Management, Discharge Planning.   Education Outcome: Needs additional education  Clinical Observations/Feedback: Pt did not attend group.   Caroll RancherMarjette Maddie Brazier, LRT/CTRS        Caroll RancherLindsay, Jasiah Elsen A 05/07/2016 12:31 PM

## 2016-05-07 NOTE — Progress Notes (Signed)
During assessment in the unit: Bruce [name] was alert, cooperative, and resting in no acute distress. He endorsed seeing shadows on the wall and hearing voices telling him to "kill himself and kill others", he denied urges to act on those commands. He is sleeping well and has a healthy appetite. He denies suicidal ideation, homicidal ideation, and urges to self-harm. He has been homeless for the last 10 years and his been living in shelters, and when asked how he felt about leaving the facility, he responded "not good about it". He appears goal directed and has a [place] [facility] [place].

## 2016-05-07 NOTE — Tx Team (Signed)
Interdisciplinary Treatment and Diagnostic Plan Update  05/07/2016 Time of Session: 10:42 AM  Bruce [name] MRN: [id number]  Principal Diagnosis: Schizophrenia ([place])  Secondary Diagnoses: Principal Problem:   Schizophrenia ([place]) Active Problems:   Alcohol use disorder, moderate, dependence (HCC)   Morbid obesity ( [place])   Hyperprolactinemia (HCC)   Cocaine use disorder, mild, abuse   Cannabis use disorder, mild, abuse   Current Medications:  Current Facility-Administered Medications  Medication Dose Route Frequency Provider Last Rate Last Dose  . acetaminophen (TYLENOL) tablet 650 mg  650 mg Oral Q6H PRN Ursula Alert, MD      . alum & mag hydroxide-simeth (MAALOX/MYLANTA) 200-200-20 MG/5ML suspension 30 mL  30 mL Oral Q4H PRN Ursula Alert, MD   30 mL at 05/06/16 2207  . benztropine (COGENTIN) tablet 0.5 mg  0.5 mg Oral BID WC Dara Hoyer, PA-C   0.5 mg at 05/07/16 0818  . doxepin (SINEQUAN) capsule 50 mg  50 mg Oral QHS Dara Hoyer, PA-C   50 mg at 05/06/16 2133  . gabapentin (NEURONTIN) capsule 400 mg  400 mg Oral TID Ursula Alert, MD   400 mg at 05/07/16 0818  . risperiDONE (RISPERDAL M-TABS) disintegrating tablet 2 mg  2 mg Oral Q8H PRN Ursula Alert, MD   2 mg at 05/03/16 1428   And  . LORazepam (ATIVAN) tablet 1 mg  1 mg Oral PRN Ursula Alert, MD       And  . ziprasidone (GEODON) injection 20 mg  20 mg Intramuscular PRN Saramma Eappen, MD      . magnesium hydroxide (MILK OF MAGNESIA) suspension 30 mL  30 mL Oral Daily PRN Ursula Alert, MD      . OLANZapine (ZYPREXA) tablet 20 mg  20 mg Oral QHS Dara Hoyer, PA-C   20 mg at 05/06/16 2132  . OLANZapine (ZYPREXA) tablet 5 mg  5 mg Oral [id number] Dara Hoyer, PA-C   5 mg at 05/07/16 8757  . perphenazine (TRILAFON) tablet 4 mg  4 mg Oral QHS Ursula Alert, MD   4 mg at 05/06/16 2132  . venlafaxine XR (EFFEXOR-XR) 24 hr capsule 225 mg  225 mg Oral Q breakfast Dara Hoyer, PA-C   225 mg at 05/07/16 0818     PTA Medications: Prescriptions Prior to Admission  Medication Sig Dispense Refill Last Dose  . ARIPiprazole (ABILIFY) 5 MG tablet Take 1 tablet (5 mg total) by mouth every evening. (Patient not taking: Reported on 04/29/2016) 30 tablet 0 Not Taking at Unknown time  . ARIPiprazole 400 MG SUSR Inject 400 mg into the muscle every 28 (twenty-eight) days. Inject 400 mg into the muscle every 28 (twenty-eight) days. Last dose 10/08/2015.  Next dose 11/05/2015. (Patient not taking: Reported on 04/29/2016) 1 each 0 Not Taking at Unknown time  . benztropine (COGENTIN) 0.5 MG tablet Take 1 tablet (0.5 mg total) by mouth 2 (two) times daily with a meal. (Patient not taking: Reported on 04/29/2016) 60 tablet 0 Not Taking at Unknown time  . doxepin (SINEQUAN) 50 MG capsule Take 1 capsule (50 mg total) by mouth at bedtime. (Patient not taking: Reported on 04/29/2016) 30 capsule 0 Not Taking at Unknown time  . gabapentin (NEURONTIN) 100 MG capsule Take 2 capsules (200 mg total) by mouth 3 (three) times daily. (Patient not taking: Reported on 04/29/2016) 180 capsule 0 Not Taking at Unknown time  . metFORMIN (GLUCOPHAGE) 500 MG tablet Take 1 tablet (500 mg total) by mouth daily with breakfast. (  Patient not taking: Reported on 04/29/2016) 30 tablet 0 Not Taking at Unknown time  . OLANZapine (ZYPREXA) 20 MG tablet Take 1 tablet (20 mg total) by mouth at bedtime. (Patient not taking: Reported on 04/29/2016) 30 tablet 0 Not Taking at Unknown time  . OLANZapine (ZYPREXA) 5 MG tablet Take 1 tablet (5 mg total) by mouth 2 (two) times daily at 7 am and 12 noon. (Patient not taking: Reported on 04/29/2016) 60 tablet 0 Not Taking at Unknown time  . venlafaxine XR (EFFEXOR-XR) 75 MG 24 hr capsule Take 3 capsules (225 mg total) by mouth daily with breakfast. (Patient not taking: Reported on 04/29/2016) 90 capsule 0 Not Taking at Unknown time    Treatment Modalities: Medication Management, Group therapy, Case management,  1 to 1 session with  clinician, Psychoeducation, Recreational therapy.   Physician Treatment Plan for Primary Diagnosis: Schizophrenia ([place]) Long Term Goal(s): Improvement in symptoms so as ready for discharge  Short Term Goals: Compliance with prescribed medications will improve  Medication Management: Evaluate patient's response, side effects, and tolerance of medication regimen.  Therapeutic Interventions: 1 to 1 sessions, Unit Group sessions and Medication administration.  Evaluation of Outcomes: Met  Physician Treatment Plan for Secondary Diagnosis: Principal Problem:   Schizophrenia ([place]) Active Problems:   Alcohol use disorder, moderate, dependence (HCC)   Morbid obesity (HCC)   Hyperprolactinemia (HCC)   Cocaine use disorder, mild, abuse   Cannabis use disorder, mild, abuse   Long Term Goal(s): Improvement in symptoms so as ready for discharge  Short Term Goals: Ability to identify triggers associated with substance abuse/mental health issues will improve  Medication Management: Evaluate patient's response, side effects, and tolerance of medication regimen.  Therapeutic Interventions: 1 to 1 sessions, Unit Group sessions and Medication administration.  Evaluation of Outcomes: Adequate for Discharge   RN Treatment Plan for Primary Diagnosis: Schizophrenia ([place]) Long Term Goal(s): Knowledge of disease and therapeutic regimen to maintain health will improve  Short Term Goals: Ability to identify and develop effective coping behaviors will improve and Compliance with prescribed medications will improve  Medication Management: RN will administer medications as ordered by provider, will assess and evaluate patient's response and provide education to patient for prescribed medication. RN will report any adverse and/or side effects to prescribing provider.  Therapeutic Interventions: 1 on 1 counseling sessions, Psychoeducation, Medication administration, Evaluate responses to treatment, Monitor  vital signs and CBGs as ordered, Perform/monitor CIWA, COWS, AIMS and Fall Risk screenings as ordered, Perform wound care treatments as ordered.  Evaluation of Outcomes: Adequate for Discharge   LCSW Treatment Plan for Primary Diagnosis: Schizophrenia [facility]) Long Term Goal(s): Safe transition to appropriate next level of care at discharge, Engage patient in therapeutic group addressing interpersonal concerns.  Short Term Goals: Engage patient in aftercare planning with referrals and resources  Therapeutic Interventions: Assess for all discharge needs, 1 to 1 time with Social worker, Explore available resources and support systems, Assess for adequacy in community support network, Educate family and significant other(s) on suicide prevention, Complete Psychosocial Assessment, Interpersonal group therapy.  Evaluation of Outcomes: Progressing  Asking for a visit with [name] from [place] program to discuss housing options 9/15:  States he will go to [employer], but return to [employer] for mental health and [facility] services   Progress in Treatment: Attending groups: Yes Participating in groups: Yes Taking medication as prescribed: Yes Toleration medication: Yes, no side effects reported at this time Family/Significant other contact made: No Patient understands diagnosis: Yes AEB asking  for help with "all my symptoms" Discussing patient identified problems/goals with staff: Yes Medical problems stabilized or resolved: Yes Denies suicidal/homicidal ideation: Yes Issues/concerns per patient self-inventory: None Other: N/A  New problem(s) identified: None identified at this time.   New Short Term/Long Term Goal(s): None identified at this time.   Discharge Plan or Barriers:  Go to [facility] in [place], follow up outpt  Reason for Continuation of Hospitalization:    Estimated Length of Stay: 3-5 days  Attendees: Patient: 05/07/2016  10:42 AM  Physician: Ursula Alert, MD 05/07/2016  10:42 AM   Nursing: Hoy Register, RN 05/07/2016  10:42 AM  RN Care Manager: Lars Pinks, RN 05/07/2016  10:42 AM  Social Worker: Ripley Fraise 05/07/2016  10:42 AM  Recreational Therapist: Laretta Bolster  05/07/2016  10:42 AM  Other: Norberto Sorenson 05/07/2016  10:42 AM  Other:  05/07/2016  10:42 AM    Scribe for Treatment Team:  Roque Lias 05/07/2016 10:42 AM

## 2016-05-07 NOTE — Progress Notes (Signed)
Patient discharged to lobby. Patient was stable and appreciative at that time. All papers and prescriptions were given and valuables returned. Verbal understanding expressed. Denies SI/HI and A/VH. Patient given opportunity to express concerns and ask questions.  
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
## 2016-05-07 NOTE — Progress Notes (Signed)
DAR NOTE: Patient presents with flat affect and depressed mood.  Reports good night sleep without help of medication aid.  Reports hearing voices telling him to hurt himself but patient verbally contracts for safety.  Patient mood and affect is calm.  Patient mood and affect is not congruent with patient report of symptoms.  Patient food and fluid intake is adequate.  Patient always request for double portion during meal time.  Rates depression at 6, hopelessness at 6, and anxiety at 6.  Patient interact with staff appropriately with no sign of patient responding to internal stimuli.  Maintained on routine safety checks.  Medications given as prescribed.  Support and encouragement offered as needed.

## 2016-05-07 NOTE — Progress Notes (Signed)
D: Pt at the time of assessment endorses moderate depression with command auditory hallucinations; stated "the voices are telling me to harm myself and others." Pt contracted for safety. Pt is also isolative and withdrawn to self even when in the dayroom. Pt remained calm and cooperative. A: Medications offered as prescribed.  Support, encouragement, and safe environment provided.  15-minute safety checks continue. R: Pt was med compliant.  Pt attended karaoke group. Safety checks continue

## 2016-05-07 NOTE — Progress Notes (Signed)
  [facility] :  Will you be returning to the same living situation after discharge:  No. At discharge, do you have transportation home?: Yes,  bus passes Do you have the ability to pay for your medications: Yes,  MCD  Release of information consent forms completed and in the chart;  [name] needed at discharge.  Patient to Follow up at: Follow-up [facility] .   Specialty:  Professional Counselor Why:  Resume services with them. Go to the walk-in clinic between 8:30 and 2:30 M-F. Contact information: [facility] [address] [place] [address] [phone]           Next level of care provider has access to [facility] Link:no  Safety Planning and Suicide Prevention discussed: Yes,  yes  Have you used any form of tobacco in the last 30 days? (Cigarettes, Smokeless Tobacco, Cigars, and/or Pipes): No  Has patient been referred to the Quitline?: N/A patient is not a smoker  Patient has been referred for addiction treatment: Yes   Bruce [name] 05/07/2016, 10:56 AM

## 2016-10-08 ENCOUNTER — Ambulatory Visit (HOSPITAL_COMMUNITY)
Admission: RE | Admit: 2016-10-08 | Discharge: 2016-10-08 | Disposition: A | Discharge status: Home / Self Care | Payer: Medicaid Other | Attending: Psychiatry | Admitting: Psychiatry

## 2016-10-08 ENCOUNTER — Emergency Department (HOSPITAL_COMMUNITY)
Admission: EM | Admit: 2016-10-08 | Discharge: 2016-10-09 | Disposition: A | Discharge status: Other Acute Inpatient Hospital | Payer: Medicaid Other | Attending: Emergency Medicine | Admitting: Emergency Medicine

## 2016-10-08 ENCOUNTER — Encounter (HOSPITAL_COMMUNITY): Payer: Self-pay | Admitting: Emergency Medicine

## 2016-10-08 DIAGNOSIS — F1410 Cocaine abuse, uncomplicated: Secondary | ICD-10-CM | POA: Insufficient documentation

## 2016-10-08 DIAGNOSIS — R45851 Suicidal ideations: Secondary | ICD-10-CM

## 2016-10-08 DIAGNOSIS — Z79899 Other long term (current) drug therapy: Secondary | ICD-10-CM | POA: Insufficient documentation

## 2016-10-08 DIAGNOSIS — F209 Schizophrenia, unspecified: Secondary | ICD-10-CM | POA: Insufficient documentation

## 2016-10-08 LAB — COMPREHENSIVE METABOLIC PANEL
ALBUMIN: 3.7 g/dL (ref 3.5–5.0)
ALK PHOS: 54 U/L (ref 38–126)
ALT: 20 U/L (ref 17–63)
AST: 21 U/L (ref 15–41)
Anion gap: 6 (ref 5–15)
BILIRUBIN TOTAL: 0.7 mg/dL (ref 0.3–1.2)
BUN: 13 mg/dL (ref 6–20)
CALCIUM: 9.5 mg/dL (ref 8.9–10.3)
CO2: 26 mmol/L (ref 22–32)
CREATININE: 1.21 mg/dL (ref 0.61–1.24)
Chloride: 108 mmol/L (ref 101–111)
GFR calc non Af Amer: >60 mL/min (ref >60)
GLUCOSE: 92 mg/dL (ref 65–99)
Potassium: 4.2 mmol/L (ref 3.5–5.1)
SODIUM: 140 mmol/L (ref 135–145)
Total Protein: 7.9 g/dL (ref 6.5–8.1)

## 2016-10-08 LAB — RAPID URINE DRUG SCREEN, HOSP PERFORMED
Amphetamines: NOT DETECTED
Barbiturates: NOT DETECTED
Benzodiazepines: NOT DETECTED
Cocaine: POSITIVE — AB
Opiates: NOT DETECTED
Tetrahydrocannabinol: NOT DETECTED

## 2016-10-08 LAB — CBC
HCT: 38 % — ABNORMAL LOW (ref 39.0–52.0)
Hemoglobin: 12.3 g/dL — ABNORMAL LOW (ref 13.0–17.0)
MCH: 28.3 pg (ref 26.0–34.0)
MCHC: 32.4 g/dL (ref 30.0–36.0)
MCV: 87.6 fL (ref 78.0–100.0)
PLATELETS: 239 10*3/uL (ref 150–400)
RBC: 4.34 MIL/uL (ref 4.22–5.81)
RDW: 14.2 % (ref 11.5–15.5)
WBC: 7.2 10*3/uL (ref 4.0–10.5)

## 2016-10-08 LAB — ETHANOL: Alcohol, Ethyl (B): <5 mg/dL (ref <5)

## 2016-10-08 MED ORDER — VENLAFAXINE HCL ER 75 MG PO CP24: 225.0000 mg | ORAL_CAPSULE | Freq: Every day | ORAL | Status: DC

## 2016-10-08 MED ORDER — GABAPENTIN 300 MG PO CAPS: 300.0000 mg | ORAL_CAPSULE | Freq: Three times a day (TID) | ORAL | Status: DC

## 2016-10-08 MED ORDER — BENZTROPINE MESYLATE 0.5 MG PO TABS
0.5000 mg | ORAL_TABLET | Freq: Two times a day (BID) | ORAL | Status: DC
  Administered 2016-10-08 – 2016-10-09 (×2): 0.5 mg via ORAL
  Filled 2016-10-08 (×2): qty 1

## 2016-10-08 MED ORDER — DOXEPIN HCL 50 MG PO CAPS: 50.0000 mg | ORAL_CAPSULE | Freq: Every day | ORAL | Status: DC

## 2016-10-08 MED ORDER — PERPHENAZINE 4 MG PO TABS: 4.0000 mg | ORAL_TABLET | Freq: Every day | ORAL | Status: DC

## 2016-10-08 MED ORDER — HALOPERIDOL 5 MG PO TABS
5.0000 mg | ORAL_TABLET | Freq: Two times a day (BID) | ORAL | Status: DC
  Administered 2016-10-08 – 2016-10-09 (×3): 5 mg via ORAL
  Filled 2016-10-08 (×3): qty 1

## 2016-10-08 MED ORDER — METFORMIN HCL 500 MG PO TABS: 500.0000 mg | ORAL_TABLET | Freq: Every day | ORAL | Status: DC

## 2016-10-08 MED ORDER — GABAPENTIN 400 MG PO CAPS: 400.0000 mg | ORAL_CAPSULE | Freq: Three times a day (TID) | ORAL | Status: DC

## 2016-10-08 NOTE — ED Provider Notes (Signed)
[facility]-EMERGENCY DEPT Provider Note   CSN: [id number] Arrival date & time: 10/08/16  1035     History   Chief Complaint Chief Complaint  Patient presents with  . Suicidal    HPI Bruce [name] is a 41 y.o. male.  HPI Patient has a history of schizophrenia. States he is hallucinating and hearing voices telling him to kill himself and kill others. Also history of cocaine abuse including last used of yesterday. States his social worker told me to come in. Seen at behavioral health and sent over here for medical clearance. Inpatient treatment recommended by them. Denies chest pain. States he has occasional headaches. States he is compliant with his medications.   Past Medical History:  Diagnosis Date  . Depression   . Obesity   . Schizophrenia [facility])     Patient Active Problem List   Diagnosis Date Noted  . Cocaine use disorder, mild, abuse 05/03/2016  . Cannabis use disorder, mild, abuse 05/03/2016  . Suicide ideation 04/30/2016  . Paranoid schizophrenia (HCC)   . Hyperprolactinemia (HCC) 09/24/2015  . Alcohol use disorder, moderate, dependence (HCC) 09/22/2015  . Morbid obesity (HCC) 09/22/2015  . Schizophrenia (HCC) 09/21/2015    History reviewed. No pertinent surgical history.     Home Medications    Prior to Admission medications   Medication Sig Start Date End Date Taking? Authorizing Provider  ibuprofen (ADVIL,MOTRIN) 200 MG tablet Take 800 mg by mouth every 4 (four) hours as needed for headache or mild pain.   Yes Historical Provider, MD  benztropine (COGENTIN) 0.5 MG tablet Take 1 tablet (0.5 mg total) by mouth 2 (two) times daily with a meal. Patient not taking: Reported on 10/08/2016 05/07/16   Truman Hayward, FNP  doxepin (SINEQUAN) 50 MG capsule Take 1 capsule (50 mg total) by mouth at bedtime. Patient not taking: Reported on 10/08/2016 05/07/16   Truman Hayward, FNP  gabapentin (NEURONTIN) 400 MG capsule Take 1 capsule (400 mg total) by mouth 3 (three) times  daily. Patient not taking: Reported on 10/08/2016 05/07/16   Truman Hayward, FNP  metFORMIN (GLUCOPHAGE) 500 MG tablet Take 1 tablet (500 mg total) by mouth daily with breakfast. Patient not taking: Reported on 04/29/2016 10/08/15   Adonis Brook, NP  OLANZapine (ZYPREXA) 20 MG tablet Take 1 tablet (20 mg total) by mouth at bedtime. 05/07/16   Truman Hayward, FNP  OLANZapine (ZYPREXA) 5 MG tablet Take 1 tablet (5 mg total) by mouth 2 (two) times daily at 7 am and 12 noon. 05/08/16   Truman Hayward, FNP  perphenazine (TRILAFON) 4 MG tablet Take 1 tablet (4 mg total) by mouth at bedtime. Patient not taking: Reported on 10/08/2016 05/07/16   Truman Hayward, FNP  venlafaxine XR (EFFEXOR-XR) 75 MG 24 hr capsule Take 3 capsules (225 mg total) by mouth daily with breakfast. Patient not taking: Reported on 10/08/2016 05/08/16   Truman Hayward, FNP    Family History Family History  Problem Relation Age of Onset  . Schizophrenia Mother     Social History Social History  Substance Use Topics  . Smoking status: Never Smoker  . Smokeless tobacco: Never Used  . Alcohol use Yes     Allergies   Patient has no known allergies.   Review of Systems Review of Systems  Constitutional: Negative for activity change and appetite change.  Eyes: Negative for pain.  Respiratory: Negative for chest tightness and shortness of breath.   Cardiovascular: Negative for chest pain  and leg swelling.  Gastrointestinal: Negative for abdominal pain, diarrhea, nausea and vomiting.  Genitourinary: Negative for flank pain.  Musculoskeletal: Negative for back pain and neck stiffness.  Skin: Negative for rash.  Neurological: Positive for headaches. Negative for weakness and numbness.  Psychiatric/Behavioral: Positive for hallucinations and suicidal ideas. Negative for behavioral problems and self-injury. The patient is not nervous/anxious.      Physical Exam Updated Vital Signs There were no vitals taken for this  visit.  Physical Exam  Constitutional: He is oriented to person, place, and time. He appears well-developed and well-nourished.  Patient is obese  HENT:  Head: Normocephalic and atraumatic.  Eyes: Pupils are equal, round, and reactive to light.  Cardiovascular: Normal rate, regular rhythm and normal heart sounds.   No murmur heard. Pulmonary/Chest: Effort normal and breath sounds normal.  Abdominal: Soft.  Musculoskeletal: Normal range of motion. He exhibits no edema.  Neurological: He is alert and oriented to person, place, and time. No cranial nerve deficit.  Skin: Skin is warm and dry.  Psychiatric: He has a normal mood and affect.  Nursing note and vitals reviewed.    ED Treatments / Results  Labs (all labs ordered are listed, but only abnormal results are displayed) Labs Reviewed  CBC - Abnormal; Notable for the following:       Result Value   Hemoglobin 12.3 (*)    HCT 38.0 (*)    All other components within normal limits  RAPID URINE DRUG SCREEN, HOSP PERFORMED - Abnormal; Notable for the following:    Cocaine POSITIVE (*)    All other components within normal limits  COMPREHENSIVE METABOLIC PANEL  ETHANOL    EKG  EKG Interpretation None       Radiology No results found.  Procedures Procedures (including critical care time)  Medications Ordered in ED Medications  metFORMIN (GLUCOPHAGE) tablet 500 mg (not administered)  benztropine (COGENTIN) tablet 0.5 mg (not administered)  doxepin (SINEQUAN) capsule 50 mg (not administered)  perphenazine (TRILAFON) tablet 4 mg (not administered)  venlafaxine XR (EFFEXOR-XR) 24 hr capsule 225 mg (not administered)  haloperidol (HALDOL) tablet 5 mg (not administered)  gabapentin (NEURONTIN) capsule 300 mg (not administered)     Initial Impression / Assessment and Plan / ED Course  I have reviewed the triage vital signs and the nursing notes.  Pertinent labs & imaging results that were available during my care of  the patient were reviewed by me and considered in my medical decision making (see chart for details).     Patient presents from behavioral health for medical clearance. Inpatient treatment recommended at behavioral health. Appears medically cleared. Will attempt to find placement.  Final Clinical Impressions(s) / ED Diagnoses   Final diagnoses:  Suicidal thoughts  Cocaine abuse    New Prescriptions New Prescriptions   No medications on file     Benjiman Core, MD 10/08/16 1256
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
## 2016-10-08 NOTE — ED Notes (Signed)
SBAR Report received from previous nurse. Pt received calm and visible on unit. Pt endorses current SI, depression, anxiety, but denies HI, A/ V H  or pain at this time, and appears otherwise stable and free of distress. Pt reminded of camera surveillance, q 15 min rounds, and rules of the milieu. Will continue to assess.

## 2016-10-08 NOTE — ED Notes (Signed)
Gave report to diane RN

## 2016-10-08 NOTE — Progress Notes (Signed)
10/08/16 1351:  LRT went to pt room to offer activities, pt was sleep.  Caroll RancherMarjette Roxann Vierra, LRT/CTRS

## 2016-10-08 NOTE — H&P (Signed)
Behavioral Health Medical Screening Exam  Bruce OrganDwight [name] is an 41 y.o. male.  Total Time spent with patient: 15 minutes  Psychiatric Specialty Exam: Physical Exam  Constitutional: He appears well-developed and well-nourished.  HENT:  Head: Normocephalic.  Right Ear: External ear normal.  Left Ear: External ear normal.  Mouth/Throat: Oropharynx is clear and moist.  Cardiovascular: Normal rate and normal heart sounds.     ROS  Blood pressure 116/67, pulse 87, temperature 98.8 F (37.1 C), temperature source Oral, resp. rate 18, SpO2 97 %.There is no height or weight on file to calculate BMI.  General Appearance: Casual  Eye Contact:  Good  Speech:  Clear and Coherent and Slow  Volume:  Decreased  Mood:  Depressed and flat  Affect:  Flat and Restricted  Thought Process:  Coherent  Orientation:  Full (Time, Place, and Person)  Thought Content:  Hallucinations: Auditory Command:  to harm others and self. voices are present all day, every day.  Suicidal Thoughts:  No  Homicidal Thoughts:  Yes.  without intent/plan  Memory:  Immediate;   Good Recent;   Fair Remote;   Fair  Judgement:  Fair  Insight:  Good  Psychomotor Activity:  Normal  Concentration: Concentration: Good and Attention Span: Good  Recall:  Good  Fund of Knowledge:Good  Language: Good  Akathisia:  No  Handed:  Right  AIMS (if indicated):     Assets:  Communication Skills Desire for Improvement Financial Resources/Insurance Resilience Social Support  Sleep:       Musculoskeletal: Strength & Muscle Tone: within normal limits Gait & Station: normal Patient leans: N/A  Blood pressure 116/67, pulse 87, temperature 98.8 F (37.1 C), temperature source Oral, resp. rate 18, SpO2 97 %.  Recommendations:  Based on my evaluation the patient does not appear to have an emergency medical condition.  Laveda AbbeLaurie Britton Gerald Kuehl, NP 10/08/2016, 10:27 AM

## 2016-10-08 NOTE — ED Triage Notes (Signed)
Pt reports hearing voices to kill himself and to kills others. [facility] and was sent here for medical clearance.

## 2016-10-08 NOTE — ED Notes (Signed)
Introduced self to patient.  Pt oriented to unit expectations.  Assessed pt for:  A) Anxiety &/or agitation: On admission to the [facility] pt is Calm and cooperative. He does not appear to be in any distress: He does not appear to be responding to internal stimuli. He reports voices telling him to hurt himself or others, no one specific.   S) Safety: Safety maintained with q-15-minute checks and hourly rounds by staff.  A) ADLs: Pt able to perform ADLs independently.  P) Pick-Up (room cleanliness): Pt's room clean and free of clutter.

## 2016-10-09 ENCOUNTER — Encounter (HOSPITAL_COMMUNITY): Payer: Self-pay

## 2016-10-09 ENCOUNTER — Inpatient Hospital Stay (HOSPITAL_COMMUNITY)
Admission: AD | Admit: 2016-10-09 | Discharge: 2016-10-15 | DRG: 885 | Disposition: A | Discharge status: Home / Self Care | Payer: Medicaid Other | Source: Intra-hospital | Attending: Psychiatry | Admitting: Psychiatry

## 2016-10-09 DIAGNOSIS — F1290 Cannabis use, unspecified, uncomplicated: Secondary | ICD-10-CM

## 2016-10-09 DIAGNOSIS — R45850 Homicidal ideations: Secondary | ICD-10-CM | POA: Diagnosis not present

## 2016-10-09 DIAGNOSIS — Z79899 Other long term (current) drug therapy: Secondary | ICD-10-CM

## 2016-10-09 DIAGNOSIS — Z6841 Body Mass Index (BMI) 40.0 and over, adult: Secondary | ICD-10-CM

## 2016-10-09 DIAGNOSIS — F200 Paranoid schizophrenia: Secondary | ICD-10-CM | POA: Diagnosis not present

## 2016-10-09 DIAGNOSIS — Z818 Family history of other mental and behavioral disorders: Secondary | ICD-10-CM | POA: Diagnosis not present

## 2016-10-09 DIAGNOSIS — F17210 Nicotine dependence, cigarettes, uncomplicated: Secondary | ICD-10-CM | POA: Diagnosis not present

## 2016-10-09 DIAGNOSIS — R45851 Suicidal ideations: Secondary | ICD-10-CM

## 2016-10-09 DIAGNOSIS — F1420 Cocaine dependence, uncomplicated: Secondary | ICD-10-CM

## 2016-10-09 DIAGNOSIS — G4700 Insomnia, unspecified: Secondary | ICD-10-CM

## 2016-10-09 DIAGNOSIS — F209 Schizophrenia, unspecified: Secondary | ICD-10-CM | POA: Diagnosis present

## 2016-10-09 DIAGNOSIS — F1490 Cocaine use, unspecified, uncomplicated: Secondary | ICD-10-CM | POA: Diagnosis not present

## 2016-10-09 MED ORDER — BENZTROPINE MESYLATE 0.5 MG PO TABS
0.5000 mg | ORAL_TABLET | Freq: Two times a day (BID) | ORAL | Status: DC
  Administered 2016-10-09 – 2016-10-14 (×11): 0.5 mg via ORAL
  Filled 2016-10-09 (×4): qty 1
  Filled 2016-10-09: qty 14
  Filled 2016-10-09 (×4): qty 1
  Filled 2016-10-09: qty 14
  Filled 2016-10-09 (×7): qty 1

## 2016-10-09 MED ORDER — ACETAMINOPHEN 325 MG PO TABS: 650.0000 mg | ORAL_TABLET | Freq: Four times a day (QID) | ORAL | Status: DC | PRN

## 2016-10-09 MED ORDER — HALOPERIDOL 5 MG PO TABS
5.0000 mg | ORAL_TABLET | Freq: Two times a day (BID) | ORAL | Status: DC
  Administered 2016-10-09 – 2016-10-11 (×4): 5 mg via ORAL
  Filled 2016-10-09 (×7): qty 1

## 2016-10-09 MED ORDER — DOXEPIN HCL 50 MG PO CAPS
50.0000 mg | ORAL_CAPSULE | Freq: Once | ORAL | Status: AC
  Administered 2016-10-09: 50 mg via ORAL
  Filled 2016-10-09: qty 2
  Filled 2016-10-09: qty 1

## 2016-10-09 NOTE — Progress Notes (Signed)
D   Pt endorses voices command in nature telling him to hurt himself or others   Pt contracts for safety and reports its is better when he isnt around people  He has isolated to his room and is guarded and suspicious    He requested a medication to help him sleep tonight  A    Verbal support given   Medications administered and effectiveness monitored    Q 15 min checks  R   Pt is safe at present time

## 2016-10-09 NOTE — Progress Notes (Signed)
Did not attend group 
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
## 2016-10-09 NOTE — Progress Notes (Signed)
CSW spoke patient at bedside. CSW discussed voluntary admission and consent for treatment and release of information with patient. CSW obtained patient's signature on voluntary admission and consent for treatment form and release of information form. CSW faxed both forms to [facility]. CSW provided patient's RN with signed forms.

## 2016-10-09 NOTE — ED Notes (Signed)
[facility] transport on unit to transfer pt to [facility] Adult unit per MD order. Pt signed for personal property and property given to transport for transfer. Pt signed e-signature. Ambulatory off unit.  

## 2016-10-09 NOTE — [facility] Suicide Risk Assessment (Signed)
[facility]   Nursing information obtained from:  Patient Demographic factors:  Male Current Mental Status:  Self-harm thoughts Loss Factors:  Financial problems / change in socioeconomic status Historical Factors:  NA Risk Reduction Factors:  Living with another person, especially a relative  Total Time spent with patient: 1 hour Principal Problem: <principal problem not specified> Diagnosis:   Patient Active Problem List   Diagnosis Date Noted  . Cocaine use disorder, mild, abuse [F14.10] 05/03/2016  . Cannabis use disorder, mild, abuse [F12.10] 05/03/2016  . Suicide ideation [R45.851] 04/30/2016  . Paranoid schizophrenia ([facility]) [F20.0]   . 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 Alcohol use disorder, moderate, dependence ([facility]) [F10.20] 09/22/2015  . Morbid obesity ([facility]) [E66.01] 09/22/2015  . Schizophrenia ([facility]) [F20.9] 09/21/2015   Subjective Data: alert oriented, endorses hallucinations and paranoia  Continued Clinical Symptoms:  Alcohol Use Disorder Identification Test Final Score (AUDIT): 1 The "Alcohol Use Disorders Identification Test", Guidelines for Use in Primary Care, Second Edition.  World Science writerHealth Organization [facility](WHO). Score between 0-7:  no or low risk or alcohol related problems. Score between 8-15:  moderate risk of alcohol related problems. Score between 16-19:  high risk of alcohol related problems. Score 20 or above:  warrants further diagnostic evaluation for alcohol dependence and treatment.   CLINICAL FACTORS:   Alcohol/Substance Abuse/Dependencies Schizophrenia:   Command hallucinatons   Musculoskeletal: Strength & Muscle Tone: within normal limits Gait & Station: normal Patient leans: no lean  Psychiatric Specialty Exam: Physical Exam  Constitutional: He appears well-developed.  HENT:  Head: Normocephalic.    Review of Systems  Cardiovascular: Negative for chest pain.  Gastrointestinal: Negative for nausea.   Psychiatric/Behavioral: Positive for depression, hallucinations and substance abuse.    Blood pressure 126/68, pulse 71, temperature 97.8 F (36.6 C), temperature source Oral, resp. rate 18, height 5\' 7"  (1.702 m), weight (!) 175.5 kg (387 lb), SpO2 100 %.Body mass index is 60.61 kg/m.  General Appearance: Casual  Eye Contact:  Fair  Speech:  Normal Rate  Volume:  Normal  Mood:  Depressed  Affect:  Congruent  Thought Process:  Goal Directed  Orientation:  Full (Time, Place, and Person)  Thought Content:  Hallucinations: Command:  auditory, Paranoid Ideation and Rumination  Suicidal Thoughts:  Does not want to act on voices.   Homicidal Thoughts:  No  Memory:  Immediate;   Fair Recent;   Fair  Judgement:  Poor  Insight:  Shallow  Psychomotor Activity:  Normal  Concentration:  Concentration: Fair and Attention Span: Fair  Recall:  [employer]Fair  Fund of Knowledge:  Fair  Language:  Fair  Akathisia:  Negative  Handed:  Right  AIMS (if indicated):     Assets:  Desire for Improvement  ADL's:  Intact  Cognition:  WNL  Sleep:         COGNITIVE FEATURES THAT CONTRIBUTE TO RISK:  Closed-mindedness    SUICIDE RISK:   Severe:  Frequent, intense, and enduring suicidal ideation, specific plan, no subjective intent, but some objective markers of intent (i.e., choice of lethal method), the method is accessible, some limited preparatory behavior, evidence of impaired self-control, severe dysphoria/symptomatology, multiple risk factors present, and few if any protective factors, particularly a lack of social support.  PLAN OF CARE: Admit for stabilization. Safety and medication adjustment.   I certify that inpatient services furnished can reasonably be expected to improve the patient's condition.   Thresa RossAKHTAR, Rashawn Rolon, MD 10/09/2016, 1:10 PM

## 2016-10-09 NOTE — Progress Notes (Signed)
Admission Note: Patient is a 41 year old male admitted to the unit with complain of command auditory hallucination and suicidal thoughts.  Patient currently contracts for safety while in the hospital.  Patient states, "he is here to get help for the voices and to get his mind back to a steady state."  Patient is alert and oriented x 4.  Patient presents with flat affect and depressed mood.  Patient is calm and appropriate to situation during assessment with good eye contact.  Treatment plan of care reviewed and consent for treatment signed.  Skin assessment completed.  skin is dry and intact.  Personal belonging searched.  No contraband found.  Patient oriented to the unit, staff and peers.  Routine safety checks initiated.  Patient is safe on the unit.

## 2016-10-09 NOTE — [facility] Counselor (Signed)
Per Chyrl CivatteJoann, [facility]1 after 0800. Attending physician Dr. Elna BreslowEappen. Updated disposition discussed with Darel HongJudy, RN.  Gwinda Passereylese D Bennett, MS, [facility], [facility] Triage Specialist [phone]

## 2016-10-09 NOTE — Tx Team (Signed)
Initial Treatment Plan 10/09/2016 11:17 AM Bruce [name] Fread [id number]    PATIENT STRESSORS: Financial difficulties Substance abuse   PATIENT STRENGTHS: Ability for insight Average or above average intelligence Capable of independent living Communication skills Supportive family/friends   PATIENT IDENTIFIED PROBLEMS: "Voices"  "getting my mind back to steady state"                   DISCHARGE CRITERIA:  Motivation to continue treatment in a less acute level of care Safe-care adequate arrangements made Verbal commitment to aftercare and medication compliance  PRELIMINARY DISCHARGE PLAN: Outpatient therapy Placement in alternative living arrangements Return to previous living arrangement  PATIENT/FAMILY INVOLVEMENT: This treatment plan has been presented to and reviewed with the patient, Bruce OrganDwight Starr, and/or family member.  The patient and family have been given the opportunity to ask questions and make suggestions.  Mickie BailElizabeth O Iwenekha, RN 10/09/2016, 11:17 AM

## 2016-10-10 DIAGNOSIS — F1490 Cocaine use, unspecified, uncomplicated: Secondary | ICD-10-CM

## 2016-10-10 MED ORDER — LOPERAMIDE HCL 2 MG PO CAPS
ORAL_CAPSULE | ORAL | Status: AC
  Administered 2016-10-10: 22:00:00
  Filled 2016-10-10: qty 2

## 2016-10-10 MED ORDER — DOXEPIN HCL 25 MG PO CAPS
50.0000 mg | ORAL_CAPSULE | Freq: Every day | ORAL | Status: DC
  Administered 2016-10-10 – 2016-10-12 (×3): 50 mg via ORAL
  Filled 2016-10-10: qty 2
  Filled 2016-10-10 (×2): qty 1
  Filled 2016-10-10 (×4): qty 2

## 2016-10-10 NOTE — Social Work (Signed)
Referred to [facility], is [place] resident.  Erique Kaser, LCSW Lead Clinical Social Worker Phone:  [phone]  

## 2016-10-10 NOTE — Progress Notes (Signed)
D:  Patient's self inventory sheet, patient has fair sleep, sleep medication is helpful.  Good appetite, low energy level, poor concentration.  Rated depression and anxiety 6, hopeless 8.  Denied withdrawals, diarrhea, agitation, nausea, runny nose.  SI, most of the time.  Physical problems, headaches.  Worst pain in past 24 hours is #8.  Goal is no voices.  No discharge plans. A:  Medications administered per MD orders.  Emotional support and encouragement given patient. R:  Denied visual hallucinations.  Stated he does have SI and HI thoughts to others.  Voices are telling him to hurt himself and others which he would not identify.  Contracts for safety.

## 2016-10-10 NOTE — Progress Notes (Signed)
D   Pt endorses voices command in nature telling him to hurt himself or others   Pt contracts for safety and reports its is better when he isnt around people  He has isolated to his room and is guarded and suspicious  He did spend some time in the dayroom but wasn't interacting with anybody  He requested a medication to help him sleep tonight   He also complained of diarrhea all day and received immodium A    Verbal support given   Medications administered and effectiveness monitored    Q 15 min checks  R   Pt is safe at present time
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
## 2016-10-10 NOTE — Progress Notes (Signed)
Adult Psychoeducational Group Note  Date:  10/10/2016 Time:  8:59 PM  Group Topic/Focus:  Wrap-Up Group:   The focus of this group is to help patients review their daily goal of treatment and discuss progress on daily workbooks.  Participation Level:  Did Not Attend  Additional Comments:  Pt was invited to attend, however pt was in bed.  Floyed Masoud C 10/10/2016, 8:59 PM 

## 2016-10-10 NOTE — [facility] Group Notes (Signed)
[facility] Group Notes: (Clinical Social Work)   10/10/2016      Type of Therapy:  Group Therapy   Participation Level:  Did Not Attend despite MHT prompting   Gunther Zawadzki Grossman-Orr, LCSW 10/10/2016, 12:44 PM     

## 2016-10-10 NOTE — Progress Notes (Signed)
[facility] MD Progress Note  10/10/2016 10:26 AM Sahil Milner  MRN:  [id number] Subjective:  Patient reports " I am not doing well, the med's is not controlling the voice in my head."  Objective: Hunt Zajicek is awake, alert and oriented*3. Seen resting in bed. Patient reports suicidal ideations without a plan. Patient denies homicidal ideations. Reports auditory and visual hallucinations patient denies that hallucinations are command in nature. Reports the voice are muffled most of the time. Patient reports he was followed [place] and feels like the injection monthly is not working.  Patient does not appear to be responding to internal stimuli. States his depression 8/10. Support, encouragement and reassurance was provided.   Principal Problem: Schizophrenia ([place]) Diagnosis:   Patient Active Problem List   Diagnosis Date Noted  . Cocaine use disorder, mild, abuse [F14.10] 05/03/2016  . Cannabis use disorder, mild, abuse [F12.10] 05/03/2016  . Suicide ideation [R45.851] 04/30/2016  . Paranoid schizophrenia ([place]) [F20.0]   . Hyperprolactinemia ([place]) [E22.1] 09/24/2015  . Alcohol use disorder, moderate, dependence ([place]) [F10.20] 09/22/2015  . Morbid obesity ([place]) [E66.01] 09/22/2015  . Schizophrenia ([place]) [F20.9] 09/21/2015   Total Time spent with patient: 30 minutes  Past Psychiatric History:   Past Medical History:  Past Medical History:  Diagnosis Date  . Depression   . Obesity   . Schizophrenia ([place])    History reviewed. No pertinent surgical history. Family History:  Family History  Problem Relation Age of Onset  . Schizophrenia Mother    Family Psychiatric  History:  Social History:  History  Alcohol Use  . Yes     History  Drug Use  . Types: Marijuana, "Crack" cocaine, Cocaine    Social History   Social History  . Marital status: Single    Spouse name: N/A  . Number of children: N/A  . Years of education: N/A   Social History Main Topics   . Smoking status: Never Smoker  . Smokeless tobacco: Never Used  . Alcohol use Yes  . Drug use: Yes    Types: Marijuana, "Crack" cocaine, Cocaine  . Sexual activity: Not Currently   Other Topics Concern  . None   Social History Narrative  . None   Additional Social History:                         Sleep: Fair  Appetite:  Fair  Current Medications: Current Facility-Administered Medications  Medication Dose Route Frequency Provider Last Rate Last Dose  . acetaminophen (TYLENOL) tablet 650 mg  650 mg Oral Q6H PRN Lurena Nida, NP      . benztropine (COGENTIN) tablet 0.5 mg  0.5 mg Oral BID WC Lurena Nida, NP   0.5 mg at 10/10/16 0750  . haloperidol (HALDOL) tablet 5 mg  5 mg Oral BID Lurena Nida, NP   5 mg at 10/10/16 3151    Lab Results:  Results for orders placed or performed during the hospital encounter of 10/08/16 (from the past 48 hour(s))  Rapid urine drug screen (hospital performed)     Status: Abnormal   Collection Time: 10/08/16 10:54 AM  Result Value Ref Range   Opiates NONE DETECTED NONE DETECTED   Cocaine POSITIVE (A) NONE DETECTED   Benzodiazepines NONE DETECTED NONE DETECTED   Amphetamines NONE DETECTED NONE DETECTED   Tetrahydrocannabinol NONE DETECTED NONE DETECTED   Barbiturates NONE DETECTED NONE DETECTED    Comment:  DRUG SCREEN FOR MEDICAL PURPOSES ONLY.  IF CONFIRMATION IS NEEDED FOR ANY PURPOSE, NOTIFY LAB WITHIN 5 DAYS.        LOWEST DETECTABLE LIMITS FOR URINE DRUG SCREEN Drug Class       Cutoff (ng/mL) Amphetamine      1000 Barbiturate      200 Benzodiazepine   071 Tricyclics       219 Opiates          300 Cocaine          300 THC              50   Comprehensive metabolic panel     Status: None   Collection Time: 10/08/16 11:09 AM  Result Value Ref Range   Sodium 140 135 - 145 mmol/L   Potassium 4.2 3.5 - 5.1 mmol/L   Chloride 108 101 - 111 mmol/L   CO2 26 22 - 32 mmol/L   Glucose, Bld 92 65 - 99 mg/dL   BUN  13 6 - 20 mg/dL   Creatinine, Ser 1.21 0.61 - 1.24 mg/dL   Calcium 9.5 8.9 - 10.3 mg/dL   Total Protein 7.9 6.5 - 8.1 g/dL   Albumin 3.7 3.5 - 5.0 g/dL   AST 21 15 - 41 U/L   ALT 20 17 - 63 U/L   Alkaline Phosphatase 54 38 - 126 U/L   Total Bilirubin 0.7 0.3 - 1.2 mg/dL   GFR calc non Af Amer >60 >60 mL/min   GFR calc Af Amer >60 >60 mL/min    Comment: (NOTE) The eGFR has been calculated using the CKD EPI equation. This calculation has not been validated in all clinical situations. eGFR's persistently <60 mL/min signify possible Chronic Kidney Disease.    Anion gap 6 5 - 15  Ethanol     Status: None   Collection Time: 10/08/16 11:09 AM  Result Value Ref Range   Alcohol, Ethyl (B) <5 <5 mg/dL    Comment:        LOWEST DETECTABLE LIMIT FOR SERUM ALCOHOL IS 5 mg/dL FOR MEDICAL PURPOSES ONLY   cbc     Status: Abnormal   Collection Time: 10/08/16 11:09 AM  Result Value Ref Range   WBC 7.2 4.0 - 10.5 K/uL   RBC 4.34 4.22 - 5.81 MIL/uL   Hemoglobin 12.3 (L) 13.0 - 17.0 g/dL   HCT 38.0 (L) 39.0 - 52.0 %   MCV 87.6 78.0 - 100.0 fL   MCH 28.3 26.0 - 34.0 pg   MCHC 32.4 30.0 - 36.0 g/dL   RDW 14.2 11.5 - 15.5 %   Platelets 239 150 - 400 K/uL    Blood Alcohol level:  Lab Results  Component Value Date   ETH <5 10/08/2016   ETH <5 75/88/3254    Metabolic Disorder Labs: Lab Results  Component Value Date   HGBA1C 5.4 09/26/2015   MPG 108 09/26/2015   MPG 103 06/07/2015   Lab Results  Component Value Date   PROLACTIN 19.9 (H) 05/06/2016   PROLACTIN 84.1 (H) 09/23/2015   Lab Results  Component Value Date   CHOL 157 09/26/2015   TRIG 72 09/26/2015   HDL 40 (L) 09/26/2015   CHOLHDL 3.9 09/26/2015   VLDL 14 09/26/2015   LDLCALC 103 (H) 09/26/2015   LDLCALC 95 06/07/2015    Physical Findings: AIMS: Facial and Oral Movements Muscles of Facial Expression: None, normal Lips and Perioral Area: None, normal Jaw: None, normal Tongue: None, normal,Extremity  Movements Upper (arms, wrists, hands, fingers): None, normal Lower (legs, knees, ankles, toes): None, normal, Trunk Movements Neck, shoulders, hips: None, normal, Overall Severity Severity of abnormal movements (highest score from questions above): None, normal Incapacitation due to abnormal movements: None, normal Patient's awareness of abnormal movements (rate only patient's report): No Awareness, Dental Status Current problems with teeth and/or dentures?: No Does patient usually wear dentures?: No  CIWA:    COWS:     Musculoskeletal: Strength & Muscle Tone: within normal limits Gait & Station: normal Patient leans: N/A  Psychiatric Specialty Exam: Physical Exam  Nursing note and vitals reviewed. Constitutional: He is oriented to person, place, and time. He appears well-developed.  Neurological: He is alert and oriented to person, place, and time.  Psychiatric: He has a normal mood and affect. His behavior is normal.    Review of Systems  Psychiatric/Behavioral: Positive for depression, hallucinations and suicidal ideas. The patient is nervous/anxious.     Blood pressure 126/68, pulse 71, temperature 97.8 F (36.6 C), temperature source Oral, resp. rate 18, height '5\' 7"'  (1.702 m), weight (!) 175.5 kg (387 lb), SpO2 100 %.Body mass index is 60.61 kg/m.  General Appearance: Casual  Eye Contact:  Minimal  Speech:  Clear and Coherent  Volume:  Normal  Mood:  Depressed, Dysphoric and Irritable  Affect:  Congruent  Thought Process:  Coherent  Orientation:  Full (Time, Place, and Person)  Thought Content:  Hallucinations: Auditory  Suicidal Thoughts:  Yes.  with intent/plan  Homicidal Thoughts:  No  Memory:  Immediate;   Fair Recent;   Fair Remote;   Fair  Judgement:  Fair  Insight:  Fair  Psychomotor Activity:  Restlessness  Concentration:  Concentration: Fair  Recall:  [employer] of Knowledge:  Fair  Language:  Negative  Akathisia:  No  Handed:  Right  AIMS (if  indicated):     Assets:  Communication Skills Desire for Improvement Resilience Social Support  ADL's:  Intact  Cognition:  WNL  Sleep:  Number of Hours: 6     I agree with current treatment plan on 10/10/2016, Patient seen face-to-face for psychiatric evaluation follow-up, chart reviewed. Reviewed the information documented and agree with the treatment plan.  Treatment Plan Summary: Daily contact with patient to assess and evaluate symptoms and progress in treatment and Medication management   Continue with Haldol 5 mg and Cogentin 0.79m   for mood stabilization. Continue with Sinequan 50 mg  mg for insomnia Will continue to monitor vitals ,medication compliance and treatment side effects while patient is here.  Reviewed lab: BAL - , UDS - positive for cocaine CSW will start working on disposition.  Patient to participate in therapeutic milieu  [facility] NP 10/10/2016, 10:26 AM  I agree with findings and treatment plan of this patient

## 2016-10-10 NOTE — [facility] Suicide Risk Assessment (Signed)
[facility] INPATIENT:  Family/Significant Other Suicide Prevention Education  Suicide Prevention Education:  Patient Refusal for Family/Significant Other Suicide Prevention Education: The patient Bruce [name] has refused to provide written consent for family/significant other to be provided Family/Significant Other Suicide Prevention Education during admission and/or prior to discharge.  Physician notified.  Carloyn JaegerMareida J Grossman-Orr 10/10/2016, 4:50 PM

## 2016-10-10 NOTE — Plan of Care (Signed)
Problem: Education: Goal: Utilization of techniques to improve thought processes will improve Outcome: Progressing Nurse discussed depression/coping skills with patient.    

## 2016-10-11 DIAGNOSIS — F17210 Nicotine dependence, cigarettes, uncomplicated: Secondary | ICD-10-CM

## 2016-10-11 MED ORDER — BENZTROPINE MESYLATE 1 MG/ML IJ SOLN: 1.0000 mg | Freq: Three times a day (TID) | INTRAMUSCULAR | Status: DC | PRN

## 2016-10-11 MED ORDER — HALOPERIDOL 5 MG PO TABS
5.0000 mg | ORAL_TABLET | Freq: Three times a day (TID) | ORAL | Status: DC | PRN
  Administered 2016-10-13: 5 mg via ORAL
  Filled 2016-10-11: qty 1

## 2016-10-11 MED ORDER — LOPERAMIDE HCL 2 MG PO CAPS
4.0000 mg | ORAL_CAPSULE | ORAL | Status: DC | PRN
  Administered 2016-10-11 – 2016-10-13 (×2): 4 mg via ORAL
  Filled 2016-10-11 (×3): qty 2

## 2016-10-11 MED ORDER — BENZTROPINE MESYLATE 1 MG PO TABS: 1.0000 mg | ORAL_TABLET | Freq: Three times a day (TID) | ORAL | Status: DC | PRN

## 2016-10-11 MED ORDER — ALUM & MAG HYDROXIDE-SIMETH 200-200-20 MG/5ML PO SUSP: 15.0000 mL | Freq: Four times a day (QID) | ORAL | Status: DC | PRN

## 2016-10-11 MED ORDER — HALOPERIDOL LACTATE 5 MG/ML IJ SOLN: 5.0000 mg | Freq: Three times a day (TID) | INTRAMUSCULAR | Status: DC | PRN

## 2016-10-11 MED ORDER — LURASIDONE HCL 40 MG PO TABS
40.0000 mg | ORAL_TABLET | Freq: Two times a day (BID) | ORAL | Status: DC
  Administered 2016-10-11 – 2016-10-13 (×5): 40 mg via ORAL
  Filled 2016-10-11 (×9): qty 1

## 2016-10-11 MED ORDER — HYDROXYZINE HCL 25 MG PO TABS
25.0000 mg | ORAL_TABLET | Freq: Four times a day (QID) | ORAL | Status: DC | PRN
  Administered 2016-10-12 – 2016-10-13 (×2): 25 mg via ORAL
  Filled 2016-10-11: qty 10
  Filled 2016-10-11 (×2): qty 1

## 2016-10-11 NOTE — Tx Team (Signed)
Interdisciplinary Treatment and Diagnostic Plan Update  10/11/2016 Time of Session: 11:21 AM  Bruce [name] MRN: [id number]  Principal Diagnosis: Schizophrenia [facility])  Secondary Diagnoses: Principal Problem:   Schizophrenia (Bruce [name])   Current Medications:  Current Facility-Administered Medications  Medication Dose Route Frequency Provider Last Rate Last Dose  . acetaminophen (TYLENOL) tablet 650 mg  650 mg Oral Q6H PRN Lurena Nida, NP      . alum & mag hydroxide-simeth (MAALOX/MYLANTA) 200-200-20 MG/5ML suspension 15 mL  15 mL Oral Q6H PRN Ursula Alert, MD      . benztropine (COGENTIN) tablet 0.5 mg  0.5 mg Oral BID WC Lurena Nida, NP   0.5 mg at 10/11/16 0805  . benztropine (COGENTIN) tablet 1 mg  1 mg Oral Q8H PRN Ursula Alert, MD       Or  . benztropine mesylate (COGENTIN) injection 1 mg  1 mg Intramuscular Q8H PRN Ursula Alert, MD      . doxepin (SINEQUAN) capsule 50 mg  50 mg Oral QHS Rozetta Nunnery, NP   50 mg at 10/10/16 2141  . haloperidol (HALDOL) tablet 5 mg  5 mg Oral Q8H PRN Ursula Alert, MD       Or  . haloperidol lactate (HALDOL) injection 5 mg  5 mg Intramuscular Q8H PRN Saramma Eappen, MD      . hydrOXYzine (ATARAX/VISTARIL) tablet 25 mg  25 mg Oral Q6H PRN Saramma Eappen, MD      . lurasidone (LATUDA) tablet 40 mg  40 mg Oral BID WC Ursula Alert, MD        PTA Medications: Prescriptions Prior to Admission  Medication Sig Dispense Refill Last Dose  . acetaminophen (TYLENOL) 325 MG tablet Take 650 mg by mouth every 6 (six) hours as needed for mild pain.     . ARIPiprazole ER (ABILIFY MAINTENA) 400 MG SRER Inject 400 mg into the muscle every 28 (twenty-eight) days.     Marland Kitchen ibuprofen (ADVIL,MOTRIN) 200 MG tablet Take 800 mg by mouth every 4 (four) hours as needed for headache or mild pain.   10/07/2016 at Unknown time  . benztropine (COGENTIN) 0.5 MG tablet Take 1 tablet (0.5 mg total) by mouth 2 (two) times daily with a meal. 60 tablet 0 Not Taking at Unknown  time  . doxepin (SINEQUAN) 50 MG capsule Take 1 capsule (50 mg total) by mouth at bedtime. (Patient taking differently: Take 50 mg by mouth at bedtime. ) 30 capsule 0 Not Taking at Unknown time  . gabapentin (NEURONTIN) 400 MG capsule Take 1 capsule (400 mg total) by mouth 3 (three) times daily. 90 capsule 0 Not Taking at Unknown time  . metFORMIN (GLUCOPHAGE) 500 MG tablet Take 1 tablet (500 mg total) by mouth daily with breakfast. 30 tablet 0 Not Taking at Unknown time  . OLANZapine (ZYPREXA) 20 MG tablet Take 1 tablet (20 mg total) by mouth at bedtime. 30 tablet 0   . OLANZapine (ZYPREXA) 5 MG tablet Take 1 tablet (5 mg total) by mouth 2 (two) times daily at 7 am and 12 noon. 60 tablet 0   . perphenazine (TRILAFON) 4 MG tablet Take 1 tablet (4 mg total) by mouth at bedtime. 30 tablet 0 Not Taking at Unknown time  . venlafaxine XR (EFFEXOR-XR) 75 MG 24 hr capsule Take 3 capsules (225 mg total) by mouth daily with breakfast. 90 capsule 0 Not Taking at Unknown time    Treatment Modalities: Medication Management, Group therapy, Case management,  1 to  1 session with clinician, Psychoeducation, Recreational therapy.   Physician Treatment Plan for Primary Diagnosis: Schizophrenia ([name]) Long Term Goal(s): Improvement in symptoms so as ready for discharge  Short Term Goals: Ability to verbalize feelings will improve Ability to disclose and discuss suicidal ideas Ability to maintain clinical measurements within normal limits will improve Compliance with prescribed medications will improve Ability to verbalize feelings will improve Ability to identify and develop effective coping behaviors will improve Ability to maintain clinical measurements within normal limits will improve  Medication Management: Evaluate patient's response, side effects, and tolerance of medication regimen.  Therapeutic Interventions: 1 to 1 sessions, Unit Group sessions and Medication administration.  Evaluation of  Outcomes: Progressing  Physician Treatment Plan for Secondary Diagnosis: Principal Problem:   Schizophrenia ([name])   Long Term Goal(s): Improvement in symptoms so as ready for discharge  Short Term Goals: Ability to verbalize feelings will improve Ability to disclose and discuss suicidal ideas Ability to maintain clinical measurements within normal limits will improve Compliance with prescribed medications will improve Ability to verbalize feelings will improve Ability to identify and develop effective coping behaviors will improve Ability to maintain clinical measurements within normal limits will improve  Medication Management: Evaluate patient's response, side effects, and tolerance of medication regimen.  Therapeutic Interventions: 1 to 1 sessions, Unit Group sessions and Medication administration.  Evaluation of Outcomes: Progressing   RN Treatment Plan for Primary Diagnosis: Schizophrenia (Bruce [name]) Long Term Goal(s): Knowledge of disease and therapeutic regimen to maintain health will improve  Short Term Goals: Ability to identify and develop effective coping behaviors will improve and Compliance with prescribed medications will improve  Medication Management: RN will administer medications as ordered by provider, will assess and evaluate patient's response and provide education to patient for prescribed medication. RN will report any adverse and/or side effects to prescribing provider.  Therapeutic Interventions: 1 on 1 counseling sessions, Psychoeducation, Medication administration, Evaluate responses to treatment, Monitor vital signs and CBGs as ordered, Perform/monitor CIWA, COWS, AIMS and Fall Risk screenings as ordered, Perform wound care treatments as ordered.  Evaluation of Outcomes: Progressing   LCSW Treatment Plan for Primary Diagnosis: Schizophrenia (Bruce [name]) Long Term Goal(s): Safe transition to appropriate next level of care at discharge, Engage patient in therapeutic  group addressing interpersonal concerns.  Short Term Goals: Engage patient in aftercare planning with referrals and resources  Therapeutic Interventions: Assess for all discharge needs, 1 to 1 time with Social worker, Explore available resources and support systems, Assess for adequacy in community support network, Educate family and significant other(s) on suicide prevention, Complete Psychosocial Assessment, Interpersonal group therapy.  Evaluation of Outcomes: Met     Progress in Treatment: Attending groups: Yes Participating in groups: Yes Taking medication as prescribed: Yes Toleration medication: Yes, no side effects reported at this time Family/Significant other contact made: Yes-[facility] the [facility] Patient understands diagnosis: Yes AEB asking for help with symptoms Discussing patient identified problems/goals with staff: Yes Medical problems stabilized or resolved: Yes Denies suicidal/homicidal ideation: Yes Issues/concerns per patient self-inventory: None Other: N/A  New problem(s) identified: None identified at this time.   New Short Term/Long Term Goal(s): None identified at this time.   Discharge Plan or Barriers:   Reason for Continuation of Hospitalization:  Hopes to get into [facility] from here, follow up [facility] Anxiety Paranoia Depression Hallucinations Medication stabilization Suicidal ideation   Estimated Length of Stay: 3-5 days  Attendees: Patient: 10/11/2016  11:21 AM  Physician: Ursula Alert, MD 10/11/2016  11:21 AM  Nursing: Hoy Register, RN 10/11/2016  11:21 AM  RN Care Manager: Lars Pinks, RN 10/11/2016  11:21 AM  Social Worker: Ripley Fraise 10/11/2016  11:21 AM  Recreational Therapist: Laretta Bolster  10/11/2016  11:21 AM  Other: Norberto Sorenson 10/11/2016  11:21 AM  Other:  10/11/2016  11:21 AM    Scribe for Treatment Team:  Roque Lias LCSW 10/11/2016 11:21 AM

## 2016-10-11 NOTE — Progress Notes (Signed)
[facility]  10/11/2016 2:02 PM Arlan OrganDwight Resch  MRN:  [id number] Subjective: Patient states " I am depressed, I still hear voices asking me to kill , I am suicidal, I am not sleeping, my medications are not working."   Objective: Patient seen and chart reviewed.Discussed patient with treatment team.  Pt today seen as depressed, guarded , reports continue command AH that asks him to kill self and others. Pt reports he continues to be suicidal , contracts for safety. Pt continues to be homicidal , denies plan , reports HI is in general to people. Pt has sleep issues , reports haldol was not effective , so he stopped his medications. Discussed readjusting his medications - he agrees.   Principal Problem: Schizophrenia (HCC) Diagnosis:   Patient Active Problem List   Diagnosis Date Noted  . Cocaine use disorder, mild, abuse [F14.10] 05/03/2016  . Cannabis use disorder, mild, abuse [F12.10] 05/03/2016  . Suicide ideation [R45.851] 04/30/2016  . Paranoid schizophrenia (HCC) [F20.0]   . 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 Alcohol use disorder, moderate, dependence (HCC) [F10.20] 09/22/2015  . Morbid obesity (HCC) [E66.01] 09/22/2015  . Schizophrenia (HCC) [F20.9] 09/21/2015   Total Time spent with patient: 30 minutes  Past Psychiatric History: Please see H&P.   Past Medical History:  Past Medical History:  Diagnosis Date  . Depression   . Obesity   . Schizophrenia (HCC)    History reviewed. No pertinent surgical history. Family History:  Family History  Problem Relation Age of Onset  . Schizophrenia Mother    Family Psychiatric  History: Please see H&P.  Social History: Please see H&P.  History  Alcohol Use  . Yes     History  Drug Use  . Types: Marijuana, "Crack" cocaine, Cocaine    Social History   Social History  . Marital status: Single    Spouse name: N/A  . Number of children: N/A  . Years of education: N/A   Social History Main Topics   . Smoking status: Never Smoker  . Smokeless tobacco: Never Used  . Alcohol use Yes  . Drug use: Yes    Types: Marijuana, "Crack" cocaine, Cocaine  . Sexual activity: Not Currently   Other Topics Concern  . None   Social History Narrative  . None   Additional Social History:                         Sleep: Poor  Appetite:  Fair  Current Medications: Current Facility-Administered Medications  Medication Dose Route Frequency Provider Last Rate Last Dose  . acetaminophen (TYLENOL) tablet 650 mg  650 mg Oral Q6H PRN Kristeen MansFran E Hobson, NP      . alum & mag hydroxide-simeth (MAALOX/MYLANTA) 200-200-20 MG/5ML suspension 15 mL  15 mL Oral Q6H PRN Jomarie LongsSaramma Creig Landin, MD      . benztropine (COGENTIN) tablet 0.5 mg  0.5 mg Oral BID WC Kristeen MansFran E Hobson, NP   0.5 mg at 10/11/16 0805  . benztropine (COGENTIN) tablet 1 mg  1 mg Oral Q8H PRN Jomarie LongsSaramma Albirtha Grinage, MD       Or  . benztropine mesylate (COGENTIN) injection 1 mg  1 mg Intramuscular Q8H PRN Shasta Chinn, MD      . doxepin (SINEQUAN) capsule 50 mg  50 mg Oral QHS Jackelyn PolingJason A Berry, NP   50 mg at 10/10/16 2141  . haloperidol (HALDOL) tablet 5 mg  5 mg Oral Q8H PRN Jomarie LongsSaramma Cythnia Osmun, MD  Or  . haloperidol lactate (HALDOL) injection 5 mg  5 mg Intramuscular Q8H PRN Jomarie Longs, MD      . hydrOXYzine (ATARAX/VISTARIL) tablet 25 mg  25 mg Oral Q6H PRN Gaynor Genco, MD      . lurasidone (LATUDA) tablet 40 mg  40 mg Oral BID WC Jayni Prescher, MD   40 mg at 10/11/16 1120    Lab Results:  No results found for this or any previous visit (from the past 48 hour(s)).  Blood Alcohol level:  Lab Results  Component Value Date   ETH <5 10/08/2016   ETH <5 04/29/2016    Metabolic Disorder Labs: Lab Results  Component Value Date   HGBA1C 5.4 09/26/2015   MPG 108 09/26/2015   MPG 103 06/07/2015   Lab Results  Component Value Date   PROLACTIN 19.9 (H) 05/06/2016   PROLACTIN 84.1 (H) 09/23/2015   Lab Results  Component Value Date    CHOL 157 09/26/2015   TRIG 72 09/26/2015   HDL 40 (L) 09/26/2015   CHOLHDL 3.9 09/26/2015   VLDL 14 09/26/2015   LDLCALC 103 (H) 09/26/2015   LDLCALC 95 06/07/2015    Physical Findings: AIMS: Facial and Oral Movements Muscles of Facial Expression: None, normal Lips and Perioral Area: None, normal Jaw: None, normal Tongue: None, normal,Extremity Movements Upper (arms, wrists, hands, fingers): None, normal Lower (legs, knees, ankles, toes): None, normal, Trunk Movements Neck, shoulders, hips: None, normal, Overall Severity Severity of abnormal movements (highest score from questions above): None, normal Incapacitation due to abnormal movements: None, normal Patient's awareness of abnormal movements (rate only patient's report): No Awareness, Dental Status Current problems with teeth and/or dentures?: No Does patient usually wear dentures?: No  CIWA:  CIWA-Ar Total: 1 COWS:  COWS Total Score: 1  Musculoskeletal: Strength & Muscle Tone: within normal limits Gait & Station: normal Patient leans: N/A  Psychiatric Specialty Exam: Physical Exam  Nursing note and vitals reviewed.   Review of Systems  Psychiatric/Behavioral: Positive for depression, hallucinations, substance abuse and suicidal ideas. The patient is nervous/anxious and has insomnia.   All other systems reviewed and are negative.   Blood pressure 96/69, pulse 88, temperature 98.9 F (37.2 C), temperature source Oral, resp. rate 20, height 5\' 7"  (1.702 m), weight (!) 175.5 kg (387 lb), SpO2 100 %.Body mass index is 60.61 kg/m.  General Appearance: Disheveled and Guarded  Eye Contact:  Fair  Speech:  Slow  Volume:  Decreased  Mood:  Anxious and Depressed  Affect:  Depressed  Thought Process:  Goal Directed and Descriptions of Associations: Circumstantial  Orientation:  Full (Time, Place, and Person)  Thought Content:  Hallucinations: Auditory Command:  kill self and others and Rumination  Suicidal Thoughts:   Yes.  without intent/plan contracts for safety  Homicidal Thoughts:  Yes.  without intent/plan contracts for safety  Memory:  Immediate;   Fair Recent;   Fair Remote;   Fair  Judgement:  Impaired  Insight:  Shallow  Psychomotor Activity:  Normal  Concentration:  Concentration: Fair and Attention Span: Fair  Recall:  [employer] of Knowledge:  Fair  Language:  Fair  Akathisia:  No  Handed:  Right  AIMS (if indicated):     Assets:  Communication Skills Desire for Improvement  ADL's:  Intact  Cognition:  WNL  Sleep:  Number of Hours: 6    Schizophrenia (HCC) unstable  Will continue today 10/11/16 plan as below except where it is noted.  Treatment Plan  Summary: Daily contact with patient to assess and evaluate symptoms and progress in treatment and Medication management   For schizophrenia: Latuda 40 mg po bid with meals - start with one time dose today and increase to bid tomorrow- psychosis/mood sx.  For insomnia: Doxepin 50 mg po qhs.  For morbid obesity: Discussed diet management, being active.  For substance use disorder; Provided substance abuse counseling.  CSW will continue to work on disposition.  Labs - will order tsh, lipid panel, hba1c, pl.  Will order KEG for qtc monitoring.  Venise Ellingwood, MD 10/11/2016, 2:02 PM

## 2016-10-11 NOTE — Progress Notes (Signed)
Recreation Therapy Notes  INPATIENT RECREATION THERAPY ASSESSMENT  Patient Details Name: Bruce [name] MRN: [id number] DOB: [birth year] Today's Date: 10/11/2016  Patient Stressors: Family, Other (Comment) Pt stated he was here for hearing voices. Pt stated his mother being in assisted living is [name] stressor. Pt stated his living situation getting worse was [name] stressor.  Coping Skills:   Isolate, Substance Abuse, Avoidance, Self-Injury, Talking, Music  Personal Challenges: Concentration, Decision-Making, Expressing Yourself, Self-Esteem/Confidence, Social Interaction, Trusting Others  Leisure Interests (2+):  Individual - TV  Awareness of Community Resources:  Yes  Community Resources:  Library  Current Use: Yes  Patient Strengths:  Mom  Patient Identified Areas of Improvement:  Talk to people more  Current Recreation Participation:  "Once every couple weeks"  Patient Goal for Hospitalization:  "Not hear voices anymore"  [place]  [place]  Current [place] (including self-harm):  Yes (Rated 10 out of 10; contract for safety)  Current HI:  Yes (Rated 10 out of 10; contract for safety)  Consent to Intern Participation: N/[name]   Caroll RancherMarjette Augustine Leverette, LRT/CTRS  Caroll RancherLindsay, Bruce [name] 10/11/2016, 2:24 PM

## 2016-10-11 NOTE — Progress Notes (Signed)
   D: When asked about his day pt stated, "It was a little rough. I was going to the bathroom 4 times today." Pt requested imodium stated he believes the diarrhea could be medication related. Pt has no questions or concerns. Pt didn't attend hs groups tonight.  A:  Support and encouraged pt to attend groups tomorrow. 15 min checks continued for safety.  R: Pt remains safe.

## 2016-10-11 NOTE — Progress Notes (Signed)
D: Pt presents with depressed affect and mood. Guarded, isolative and minimal but is assertive related to getting his needs met. Denies SI, HI, VH and pain. Endorsed + AH when assessed. Rates his depression 7/10, hopelessness 7/10 and anxiety 6/10 on self inventory sheet. Reports fair sleep last night with fair appetite, low energy and poor concentration level.  A: Verbally encourage pt to voice concerns, comply with medications as ordered and attend scheduled unit groups. Medications administered as ordered. Q 15 minutes safety checks maintained on and off unit without self harm gestures or outburst.  R: Pt receptive to care. Compliant with medications when offered. Denies adverse drug reactions at this time. Tolerates all PO intake well. POC continues for safety and mood stability.

## 2016-10-11 NOTE — Progress Notes (Signed)
Recreation Therapy Notes  Date: 10/11/16 Time: 1000 Location: 500 Hall Dayroom  Group Topic: Wellness  Goal Area(s) Addresses:  Patient will define components of whole wellness. Patient will verbalize benefit of whole wellness.  Intervention: Chair exercises, meditation  Activity: Chair Exercises, Meditation.  LRT introduced chair exercises as a way to get in some physical wellness and meditation as a way to exercise mental wellness.  LRT read script to allow patients to engage in both techniques. Patients were to follow along as LRT read from scripts to engage in both activities.  Education: Wellness, Building control surveyorDischarge Planning.   Education Outcome: Acknowledges education/In group clarification offered/Needs additional education.   Clinical Observations/Feedback: Pt did not attend group.   Caroll RancherMarjette Carlson Belland, LRT/CTRS         Caroll RancherLindsay, Sakiya Stepka A 10/11/2016 12:47 PM

## 2016-10-11 NOTE — [facility] Group Notes (Signed)
[facility] LCSW Group Therapy  10/11/2016 1:15 pm  Type of Therapy: Process Group Therapy  Participation Level:  Active  Participation Quality:  Appropriate  Affect:  Flat  Cognitive:  Oriented  Insight:  Improving  Engagement in Group:  Limited  Engagement in Therapy:  Limited  Modes of Intervention:  Activity, Clarification, Education, Problem-solving and Support  Summary of Progress/Problems: Today's group addressed the issue of overcoming obstacles.  Patients were asked to identify their biggest obstacle post d/c that stands in the way of their on-going success, and then problem solve as to how to manage this. Invited.  Begged off due to "just getting medication."  Ida Rogueorth, Hersh Minney B 10/11/2016   4:20 PM

## 2016-10-12 LAB — TSH: TSH: 1.923 u[IU]/mL (ref 0.350–4.500)

## 2016-10-12 LAB — LIPID PANEL
CHOL/HDL RATIO: 3.5 ratio
CHOLESTEROL: 143 mg/dL (ref 0–200)
HDL: 41 mg/dL (ref >40)
LDL Cholesterol: 88 mg/dL (ref 0–99)
TRIGLYCERIDES: 70 mg/dL (ref <150)
VLDL: 14 mg/dL (ref 0–40)

## 2016-10-12 MED ORDER — IBUPROFEN 800 MG PO TABS
800.0000 mg | ORAL_TABLET | Freq: Four times a day (QID) | ORAL | Status: DC | PRN
  Administered 2016-10-12 – 2016-10-13 (×2): 800 mg via ORAL
  Filled 2016-10-12 (×2): qty 1

## 2016-10-12 MED ORDER — FLUTICASONE PROPIONATE 50 MCG/ACT NA SUSP
2.0000 | Freq: Every day | NASAL | Status: DC
Start: 2016-10-12 — End: 2016-10-15
  Administered 2016-10-12 – 2016-10-14 (×3): 2 via NASAL
  Filled 2016-10-12: qty 16

## 2016-10-12 MED ORDER — LORATADINE 10 MG PO TABS
10.0000 mg | ORAL_TABLET | Freq: Every day | ORAL | Status: DC
  Administered 2016-10-12 – 2016-10-14 (×3): 10 mg via ORAL
  Filled 2016-10-12 (×5): qty 1

## 2016-10-12 MED ORDER — INFLUENZA VAC SPLIT QUAD 0.5 ML IM SUSY
0.5000 mL | PREFILLED_SYRINGE | INTRAMUSCULAR | Status: AC
  Administered 2016-10-13: 0.5 mL via INTRAMUSCULAR
  Filled 2016-10-12: qty 0.5

## 2016-10-12 NOTE — Progress Notes (Signed)
D: Pt awake in dayroom observed interacting well with peers. Denies SI, HI, VH and pain. Continues to endorse AH "the voices are still telling me to hurt myself, but I know not to do that". Rates his depression 8/10, hopelessness 8/10 and anxiety 7/10. Reports fair sleep, good concentration level and low mood on self inventory sheet.  A: Emotional support and availability provided to pt throughout this shift. PRN and scheduled medications administered as prescribed. Encouraged pt to voice concerns, comply with current treatment regimen including medications and unit groups. Q 15 minutes safety checks maintained. R: Pt receptive to care. Attended noon group but not AM. Compliant with medications when offered. Denies adverse drug reactions when assessed. Pt has made no gestures to harm self or others thus far.

## 2016-10-12 NOTE — Progress Notes (Signed)
  DATA ACTION RESPONSE  Objective- Pt. is up and visible in the room, resting in bed with eyes open. Pt. presents with an anxious/depressed affect and mood. Pt. was minimal and guarded with interaction. Subjective- Denies having any SI/HI/VH/Pain at this time. Endorses +AH stating Pt. states "the voices are command and they are telling me to hurt myself and others". Pt. was reassure of his safety on this unit. Pt. continues to be cooperative and remain pleasant on the unit.  1:1 interaction in private to establish rapport. Encouragement, education, & support given from staff. Meds. ordered and administered.   Safety maintained with Q 15 checks. Continues to follow treatment plan and will monitor closely. No additonal questions/concerns noted.

## 2016-10-12 NOTE — Progress Notes (Signed)
Adult Psychoeducational Group Note  Date:  10/12/2016 Time:  11:52 PM  Group Topic/Focus:  Wrap-Up Group:   The focus of this group is to help patients review their daily goal of treatment and discuss progress on daily workbooks.  Participation Level:  Did Not Attend  Participation Quality:  Did not attend  Affect:  Did not attend  Cognitive:  Did not attend  Insight: None  Engagement in Group:  Did not attend  Modes of Intervention:  Did not attend  Additional Comments:  Patient did not attend wrap up group this evening.  Heberto Sturdevant L Mara Favero 10/12/2016, 11:52 PM

## 2016-10-12 NOTE — Plan of Care (Signed)
Problem: Safety: Goal: Periods of time without injury will increase Outcome: Progressing Pt. denies SI/HI/VH/Pain at this time, endorses +AH, remains a fall risk, Q 15 checks in effect.

## 2016-10-12 NOTE — [facility] Group Notes (Signed)
[facility] LCSW Group Therapy  10/12/2016 2:42 PM   Type of Therapy:  Group Therapy  Participation Level:  Active  Participation Quality:  Attentive  Affect:  Appropriate  Cognitive:  Appropriate  Insight:  Improving  Engagement in Therapy:  Engaged  Modes of Intervention:  Clarification, Education, Exploration and Socialization  Summary of Progress/Problems: Today's group focused on relapse prevention.  We defined the term, and then brainstormed on ways to prevent relapse. Invited.  Begged off due to headache.  Daryel Geraldorth, Helmi Hechavarria B 10/12/2016 , 2:42 PM

## 2016-10-12 NOTE — Progress Notes (Signed)
[facility] MD Progress Note  10/12/2016 12:35 PM Bruce [name]  MRN:  [id number] Subjective: Patient states " I have this headache , and I am having some loose motion still . I am also hearing voices asking me to kill myself and others and also having SI ."   Objective:Patient seen and chart reviewed.Discussed patient with treatment team.  Pt today seen in bed , states he has a headache, he also has been having some loose stools since the past several days , responding to imodium. Pt encouraged to take po fluids , bland diet and ask for help if his loose motion worsens. Pt also advised to take symptomatic medications for pain. Pt otherwise noted as passive , spoke to Clinical research associate with his eyes closed , continues to endorse AH/SI. Pt also stays withdrawn , does not attend groups.    Principal Problem: Schizophrenia (HCC) Diagnosis:   Patient Active Problem List   Diagnosis Date Noted  . Cocaine use disorder, mild, abuse [F14.10] 05/03/2016  . Cannabis use disorder, mild, abuse [F12.10] 05/03/2016  . Suicide ideation [R45.851] 04/30/2016  . Paranoid schizophrenia (HCC) [F20.0]   . 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 Alcohol use disorder, moderate, dependence (HCC) [F10.20] 09/22/2015  . Morbid obesity (HCC) [E66.01] 09/22/2015  . Schizophrenia (HCC) [F20.9] 09/21/2015   Total Time spent with patient: 25 minutes  Past Psychiatric History: Please see H&P.   Past Medical History:  Past Medical History:  Diagnosis Date  . Depression   . Obesity   . Schizophrenia (HCC)    History reviewed. No pertinent surgical history. Family History:  Family History  Problem Relation Age of Onset  . Schizophrenia Mother    Family Psychiatric  History: Please see H&P.  Social History: Please see H&P.  History  Alcohol Use  . Yes     History  Drug Use  . Types: Marijuana, "Crack" cocaine, Cocaine    Social History   Social History  . Marital status: Single    Spouse name: N/A  .  Number of children: N/A  . Years of education: N/A   Social History Main Topics  . Smoking status: Never Smoker  . Smokeless tobacco: Never Used  . Alcohol use Yes  . Drug use: Yes    Types: Marijuana, "Crack" cocaine, Cocaine  . Sexual activity: Not Currently   Other Topics Concern  . None   Social History Narrative  . None   Additional Social History:                         Sleep: restless, improving  Appetite:  Fair  Current Medications: Current Facility-Administered Medications  Medication Dose Route Frequency Provider Last Rate Last Dose  . alum & mag hydroxide-simeth (MAALOX/MYLANTA) 200-200-20 MG/5ML suspension 15 mL  15 mL Oral Q6H PRN Jomarie Longs, MD      . benztropine (COGENTIN) tablet 0.5 mg  0.5 mg Oral BID WC Kristeen Mans, NP   0.5 mg at 10/12/16 0746  . benztropine (COGENTIN) tablet 1 mg  1 mg Oral Q8H PRN Jomarie Longs, MD       Or  . benztropine mesylate (COGENTIN) injection 1 mg  1 mg Intramuscular Q8H PRN Kelci Petrella, MD      . doxepin (SINEQUAN) capsule 50 mg  50 mg Oral QHS Jackelyn Poling, NP   50 mg at 10/11/16 2157  . fluticasone (FLONASE) 50 MCG/ACT nasal spray 2 spray  2 spray Each Nare Daily  Jomarie LongsSaramma Henslee Lottman, MD   2 spray at 10/12/16 1212  . haloperidol (HALDOL) tablet 5 mg  5 mg Oral Q8H PRN Jomarie LongsSaramma Tiarrah Saville, MD       Or  . haloperidol lactate (HALDOL) injection 5 mg  5 mg Intramuscular Q8H PRN Jomarie LongsSaramma Nakyah Erdmann, MD      . hydrOXYzine (ATARAX/VISTARIL) tablet 25 mg  25 mg Oral Q6H PRN Jomarie LongsSaramma Jawanda Passey, MD      . ibuprofen (ADVIL,MOTRIN) tablet 800 mg  800 mg Oral Q6H PRN Jomarie LongsSaramma Darci Lykins, MD   800 mg at 10/12/16 1213  . loperamide (IMODIUM) capsule 4 mg  4 mg Oral PRN Kerry HoughSpencer E Simon, PA-C   4 mg at 10/11/16 2157  . loratadine (CLARITIN) tablet 10 mg  10 mg Oral Daily Jomarie LongsSaramma Kimberla Driskill, MD   10 mg at 10/12/16 1211  . lurasidone (LATUDA) tablet 40 mg  40 mg Oral BID WC Jomarie LongsSaramma Aum Caggiano, MD   40 mg at 10/12/16 [id number]    Lab Results:  Results for  orders placed or performed during the hospital encounter of 10/09/16 (from the past 48 hour(s))  TSH     Status: None   Collection Time: 10/12/16  6:15 AM  Result Value Ref Range   TSH 1.923 0.350 - 4.500 uIU/mL    Comment: Performed by a 3rd Generation assay with a functional sensitivity of <=0.01 uIU/mL. Performed at [facility], [address] [place], [place] [address]   Lipid panel     Status: None   Collection Time: 10/12/16  6:15 AM  Result Value Ref Range   Cholesterol 143 0 - 200 mg/dL   Triglycerides 70 <782<150 mg/dL   HDL 41 >95>40 mg/dL   Total CHOL/HDL Ratio 3.5 RATIO   VLDL 14 0 - 40 mg/dL   LDL Cholesterol 88 0 - 99 mg/dL    Comment:        Total Cholesterol/HDL:CHD Risk Coronary Heart Disease Risk Table                     Men   Women  1/2 Average Risk   3.4   3.3  Average Risk       5.0   4.4  2 X Average Risk   9.6   7.1  3 X Average Risk  23.4   11.0        Use the calculated Patient Ratio above and the CHD Risk Table to determine the patient's CHD Risk.        ATP III CLASSIFICATION (LDL):  <100     mg/dL   Optimal  [id number]  mg/dL   Near or Above                    Optimal  130-159  mg/dL   Borderline  [id number]  mg/dL   High  >962>190     mg/dL   Very High Performed at [facility], [address] [place], [place] [address]     Blood Alcohol level:  Lab Results  Component Value Date   [facility] 10/[facility]2016   ETH <5 04/29/2016    Metabolic Disorder Labs: Lab Results  Component Value Date   HGBA1C 5.4 09/26/2015   MPG 108 09/26/2015   MPG 103 06/07/2015   Lab Results  Component Value Date   PROLACTIN 19.9 (H) 05/06/2016   PROLACTIN 84.1 (H) 09/23/2015   Lab Results  Component Value Date   CHOL 143 10/12/2016  TRIG 70 10/12/2016   HDL 41 10/12/2016   CHOLHDL 3.5 10/12/2016   VLDL 14 10/12/2016   LDLCALC 88 10/12/2016   LDLCALC 103 (H) 09/26/2015    Physical Findings: AIMS: Facial and Oral Movements Muscles of  Facial Expression: None, normal Lips and Perioral Area: None, normal Jaw: None, normal Tongue: None, normal,Extremity Movements Upper (arms, wrists, hands, fingers): None, normal Lower (legs, knees, ankles, toes): None, normal, Trunk Movements Neck, shoulders, hips: None, normal, Overall Severity Severity of abnormal movements (highest score from questions above): None, normal Incapacitation due to abnormal movements: None, normal Patient's awareness of abnormal movements (rate only patient's report): No Awareness, Dental Status Current problems with teeth and/or dentures?: No Does patient usually wear dentures?: No  CIWA:  CIWA-Ar Total: 1 COWS:  COWS Total Score: 1  Musculoskeletal: Strength & Muscle Tone: within normal limits Gait & Station: normal Patient leans: N/A  Psychiatric Specialty Exam: Physical Exam  Nursing note and vitals reviewed.   Review of Systems  Psychiatric/Behavioral: Positive for depression, hallucinations, substance abuse and suicidal ideas. The patient is nervous/anxious and has insomnia.   All other systems reviewed and are negative.   Blood pressure 122/77, pulse 79, temperature 98.6 F (37 C), temperature source Oral, resp. rate 20, height 5\' 7"  (1.702 m), weight (!) 175.5 kg (387 lb), SpO2 100 %.Body mass index is 60.61 kg/m.  General Appearance: Guarded  Eye Contact:  Poor  Speech:  Slow  Volume:  Decreased  Mood:  Anxious and Dysphoric  Affect:  Congruent  Thought Process:  Linear and Descriptions of Associations: Circumstantial  Orientation:  Other:  person, place  Thought Content:  Hallucinations: Auditory Command:  kill, kill and Rumination  Suicidal Thoughts:  Yes.  without intent/plan contracts for safety  Homicidal Thoughts:  Yes.  without intent/plan contracts for safety  Memory:  Immediate;   Fair Recent;   Fair Remote;   Fair  Judgement:  Impaired  Insight:  Shallow  Psychomotor Activity:  Normal  Concentration:  Concentration:  Fair and Attention Span: Fair  Recall:  [employer] of Knowledge:  Fair  Language:  Fair  Akathisia:  No  Handed:  Right  AIMS (if indicated):     Assets:  Communication Skills Desire for Improvement  ADL's:  Intact  Cognition:  WNL  Sleep:  Number of Hours: 6    Schizophrenia (HCC) unstable  Will continue today 10/12/16  plan as below except where it is noted.  Treatment Plan Summary:Patient today seen with severe headache , is withdrawn , depressed and has command AH asking him to kill self, others. Pt also with loose stools , headache- will monitor VS, encourage po fluids, will not change the dose of latuda or other psychtropics today since he has diarrhea.will monitor.  Daily contact with patient to assess and evaluate symptoms and progress in treatment and Medication management   For schizophrenia:reviewed  Latuda 40 mg po bid with meals - start with one time dose today and increase to bid tomorrow- psychosis/mood sx.  Insomnia: Continue Doxepin 50 mg po qhs.  For morbid obesity: Discussed diet management, being active.  For substance use disorder; Provided substance abuse counseling.  For headache: Symptomatic treatment, encourage po fluids.  For diarrhea: Will not make any medication readjustments with above medications today . Bland diet, imodium.  CSW will continue to work on disposition.  Labs reviewed - tsh- wnl, lipid panel - wnl , pending hba1c, pl.  EKG reviewed- qtc - wnl.  Jaquawn Saffran,  MD 10/12/2016, 12:35 PM

## 2016-10-12 NOTE — Progress Notes (Signed)
Recreation Therapy Notes  Date: 10/12/16 Time: 1000 Location: 500 Hall Dayroom  Group Topic: Coping Skills  Goal Area(s) Addresses:  Patients will be able to identify positive coping skills. Patients will be able to identify benefits of using coping skills post d/c.  Intervention: Worksheet, pencils, dry erase marker, dry erase board  Activity: Mindmap.  Patients were given a worksheet of a a blank mind map.  As a group, patients filled in the first 8 boxes with triggers that require coping skills.  Patients were to then come up with 3 coping skills for each trigger identified by the group.  Once patients identified their coping skills, the group would fill in the remainder of the map on the board.  Education: PharmacologistCoping Skills, Building control surveyorDischarge Planning.   Education Outcome: Acknowledges understanding/In group clarification offered/Needs additional education.   Clinical Observations/Feedback: Pt did not attend group.    Caroll RancherMarjette Neriyah Cercone, LRT/CTRS         Caroll RancherLindsay, Annalissa Murphey A 10/12/2016 12:40 PM

## 2016-10-13 LAB — HEMOGLOBIN A1C
Hgb A1c MFr Bld: 5.2 % (ref 4.8–5.6)
MEAN PLASMA GLUCOSE: 103

## 2016-10-13 LAB — PROLACTIN: PROLACTIN: 63.9 ng/mL — AB (ref 4.0–15.2)

## 2016-10-13 MED ORDER — ZOLPIDEM TARTRATE 5 MG PO TABS
5.0000 mg | ORAL_TABLET | Freq: Every day | ORAL | Status: DC
  Administered 2016-10-13: 5 mg via ORAL
  Filled 2016-10-13 (×2): qty 1

## 2016-10-13 MED ORDER — ZIPRASIDONE HCL 40 MG PO CAPS
40.0000 mg | ORAL_CAPSULE | Freq: Two times a day (BID) | ORAL | Status: DC
  Administered 2016-10-13 – 2016-10-14 (×3): 40 mg via ORAL
  Filled 2016-10-13 (×2): qty 1
  Filled 2016-10-13: qty 14
  Filled 2016-10-13 (×4): qty 1
  Filled 2016-10-13: qty 14
  Filled 2016-10-13: qty 1

## 2016-10-13 NOTE — Progress Notes (Signed)
Recreation Therapy Notes  Date: 10/13/16 Time: 1000 Location: 500 [address] Dayroom  Group Topic: Self-Esteem  Goal Area(s) Addresses:  Patient will identify positive ways to increase self-esteem. Patient will verbalize benefit of increased self-esteem.  Behavioral Response: Engaged  Intervention: Worksheet, colored pencils  Activity: Crest.  Patients were given a blank crest.  The crest was divided into four sections.  In each section, patients were to highlight something positive about themselves.  Patients could choose from topics such as proudest moment, best quality, biggest achievement, favorite activity, best feature or they could come up with different topics on their own.  Education:  Self-Esteem, Building control surveyorDischarge Planning.   Education Outcome: Acknowledges education/In group clarification offered/Needs additional education  Clinical Observations/Feedback: Pt arrived late but became fully engaged in group.  Pt stated his favorite activity was watching television, his biggest accomplishment was getting his SSI check, something he's good at is talking to his mom and his proudest moment is making his mom smile.  Pt stated focusing on these positive things "allows me to see things more clearly."   Caroll RancherMarjette Aiden Rao, LRT/CTRS      Caroll RancherLindsay, Shanaia Sievers A 10/13/2016 11:18 AM

## 2016-10-13 NOTE — Progress Notes (Signed)
DAR NOTE: Patient presents with anxious affect and depressed mood.  Denies pain,complained auditory  hallucinations.  Rates depression at 7, hopelessness at 8, and anxiety at 6.  Maintained on routine safety checks.  Medications given as prescribed.  Support and encouragement offered as needed.  Attended group and participated.  Patient observed socializing with peers in the dayroom.  Offered no complaint.

## 2016-10-13 NOTE — [facility] Group Notes (Signed)
[facility] Group Notes:  (Counselor/Nursing/MHT/Case Management/Adjunct)  10/13/2016 1:15PM  Type of Therapy:  Group Therapy  Participation Level:  Active  Participation Quality:  Appropriate  Affect:  Flat  Cognitive:  Oriented  Insight:  Improving  Engagement in Group:  Limited  Engagement in Therapy:  Limited  Modes of Intervention:  Discussion, Exploration and Socialization  Summary of Progress/Problems: The topic for group was balance in life.  Pt participated in the discussion about when their life was in balance and out of balance and how this feels.  Pt discussed ways to get back in balance and short term goals they can work on to get where they want to be.  Reluctant attender, participant.  Was assuredly "unbalanced" because he is still experiencing multiple symptoms.  Talked about his mother at an ALF and how it is difficult to get to her to see her.  Stated he watches TV during the day, unable to identify anything else.   Daryel Geraldorth, Janney Priego B 10/13/2016 1:09 PM

## 2016-10-13 NOTE — Progress Notes (Signed)
[facility]  10/13/2016 3:28 PM Bruce OrganDwight [name]  MRN:  [id number] Subjective: Patient states " I am suicidal , I still am not sleeping, I do not think my medications are working."   Objective:Patient seen and chart reviewed.Discussed patient with treatment team.  Pt today seen again with continued sx of command AH asking him kill self or others. Pt has command AH that ask him to kill people in general , but he contracts for safety. Pt also with SI - denies plan, contracts for safety. Pt reports sleep issues - discussed staying awake during daytime , having a good sleep hygiene as well as making use of PRN medications. Pt reports loose stools, but is observed as taking his meals without any problems. Pt also was noted as having some behavioral issues - has been urinating in the trash cans since admission - when asked about this stated he was too dizzy to walk few steps to go to the bathroom. Patient advised to make use of the call bell to call for help if needed and to avoid such behaviors in the future.    Principal Problem: Schizophrenia (HCC) Diagnosis:   Patient Active Problem List   Diagnosis Date Noted  . Cocaine use disorder, mild, abuse [F14.10] 05/03/2016  . Cannabis use disorder, mild, abuse [F12.10] 05/03/2016  . Suicide ideation [R45.851] 04/30/2016  . Paranoid schizophrenia (HCC) [F20.0]   . 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 Alcohol use disorder, moderate, dependence (HCC) [F10.20] 09/22/2015  . Morbid obesity (HCC) [E66.01] 09/22/2015  . Schizophrenia (HCC) [F20.9] 09/21/2015   Total Time spent with patient: 25 minutes  Past Psychiatric History: Please see H&P.   Past Medical History:  Past Medical History:  Diagnosis Date  . Depression   . Obesity   . Schizophrenia (HCC)    History reviewed. No pertinent surgical history. Family History:  Family History  Problem Relation Age of Onset  . Schizophrenia Mother    Family Psychiatric  History:  Please see H&P.  Social History: Please see H&P.  History  Alcohol Use  . Yes     History  Drug Use  . Types: Marijuana, "Crack" cocaine, Cocaine    Social History   Social History  . Marital status: Single    Spouse name: N/A  . Number of children: N/A  . Years of education: N/A   Social History Main Topics  . Smoking status: Never Smoker  . Smokeless tobacco: Never Used  . Alcohol use Yes  . Drug use: Yes    Types: Marijuana, "Crack" cocaine, Cocaine  . Sexual activity: Not Currently   Other Topics Concern  . None   Social History Narrative  . None   Additional Social History:                         Sleep: poor  Appetite:  Fair  Current Medications: Current Facility-Administered Medications  Medication Dose Route Frequency Provider Last Rate Last Dose  . alum & mag hydroxide-simeth (MAALOX/MYLANTA) 200-200-20 MG/5ML suspension 15 mL  15 mL Oral Q6H PRN Jomarie LongsSaramma Sumaiya Arruda, MD      . benztropine (COGENTIN) tablet 0.5 mg  0.5 mg Oral BID WC Kristeen MansFran E Hobson, NP   0.5 mg at 10/13/16 0824  . benztropine (COGENTIN) tablet 1 mg  1 mg Oral Q8H PRN Jomarie LongsSaramma Jameel Quant, MD       Or  . benztropine mesylate (COGENTIN) injection 1 mg  1 mg Intramuscular Q8H PRN  Jomarie Longs, MD      . fluticasone (FLONASE) 50 MCG/ACT nasal spray 2 spray  2 spray Each Nare Daily Jomarie Longs, MD   2 spray at 10/13/16 0824  . haloperidol (HALDOL) tablet 5 mg  5 mg Oral Q8H PRN Jomarie Longs, MD       Or  . haloperidol lactate (HALDOL) injection 5 mg  5 mg Intramuscular Q8H PRN Lachina Salsberry, MD      . hydrOXYzine (ATARAX/VISTARIL) tablet 25 mg  25 mg Oral Q6H PRN Jomarie Longs, MD   25 mg at 10/12/16 1719  . ibuprofen (ADVIL,MOTRIN) tablet 800 mg  800 mg Oral Q6H PRN Jomarie Longs, MD   800 mg at 10/13/16 0824  . loperamide (IMODIUM) capsule 4 mg  4 mg Oral PRN Kerry Hough, PA-C   4 mg at 10/11/16 2157  . loratadine (CLARITIN) tablet 10 mg  10 mg Oral Daily Jomarie Longs, MD    10 mg at 10/13/16 0824  . ziprasidone (GEODON) capsule 40 mg  40 mg Oral BID WC Paxton Binns, MD      . zolpidem (AMBIEN) tablet 5 mg  5 mg Oral QHS Jomarie Longs, MD        Lab Results:  Results for orders placed or performed during the hospital encounter of 10/09/16 (from the past 48 hour(s))  TSH     Status: None   Collection Time: 10/12/16  6:15 AM  Result Value Ref Range   TSH 1.923 0.350 - 4.500 uIU/mL    Comment: Performed by a 3rd Generation assay with a functional sensitivity of <=0.01 uIU/mL. Performed at [facility], [address], [place], [place] [address]   Lipid panel     Status: None   Collection Time: 10/12/16  6:15 AM  Result Value Ref Range   Cholesterol 143 0 - 200 mg/dL   Triglycerides 70 <604 mg/dL   HDL 41 >54 mg/dL   Total CHOL/HDL Ratio 3.5 RATIO   VLDL 14 0 - 40 mg/dL   LDL Cholesterol 88 0 - 99 mg/dL    Comment:        Total Cholesterol/HDL:CHD Risk Coronary Heart Disease Risk Table                     Men   Women  1/2 Average Risk   3.4   3.3  Average Risk       5.0   4.4  2 X Average Risk   9.6   7.1  3 X Average Risk  23.4   11.0        Use the calculated Patient Ratio above and the CHD Risk Table to determine the patient's CHD Risk.        ATP III CLASSIFICATION (LDL):  <100     mg/dL   Optimal  098-119  mg/dL   Near or Above                    Optimal  130-159  mg/dL   Borderline  147-829  mg/dL   High  >562     mg/dL   Very High Performed at [facility], [address], [place], [place] [address]   Hemoglobin A1c     Status: None   Collection Time: 10/12/16  6:15 AM  Result Value Ref Range   Hgb A1c MFr Bld 5.2 4.8 - 5.6 %    Comment: (NOTE)  Pre-diabetes: 5.7 - 6.4         Diabetes: >6.4         Glycemic control for adults with diabetes: <7.0    Mean Plasma Glucose 103     Comment: (NOTE) Performed At: [facility] [address] [place], [place] [id number] Mila Homer MD  WU:[id number] Performed at [facility], [address] [place], [place] [address]   Prolactin     Status: Abnormal   Collection Time: 10/12/16  6:15 AM  Result Value Ref Range   Prolactin 63.9 (H) 4.0 - 15.2 ng/mL    Comment: (NOTE) Performed At: [facility] [address] [place], [place] [id number] Mila Homer MD [id number] Performed at [facility], [address], [place], [place] [address]     Blood Alcohol level:  Lab Results  Component Value Date   [facility] <5 10/08/2016   ETH <5 04/29/2016    Metabolic Disorder Labs: Lab Results  Component Value Date   HGBA1C 5.2 10/12/2016   MPG 103 10/12/2016   MPG 108 09/26/2015   Lab Results  Component Value Date   PROLACTIN 63.9 (H) 10/12/2016   PROLACTIN 19.9 (H) 05/06/2016   Lab Results  Component Value Date   CHOL 143 10/12/2016   TRIG 70 10/12/2016   HDL 41 10/12/2016   CHOLHDL 3.5 10/12/2016   VLDL 14 10/12/2016   LDLCALC 88 10/12/2016   LDLCALC 103 (H) 09/26/2015    Physical Findings: AIMS: Facial and Oral Movements Muscles of Facial Expression: None, normal Lips and Perioral Area: None, normal Jaw: None, normal Tongue: None, normal,Extremity Movements Upper (arms, wrists, hands, fingers): None, normal Lower (legs, knees, ankles, toes): None, normal, Trunk Movements Neck, shoulders, hips: None, normal, Overall Severity Severity of abnormal movements (highest score from questions above): None, normal Incapacitation due to abnormal movements: None, normal Patient's awareness of abnormal movements (rate only patient's report): No Awareness, Dental Status Current problems with teeth and/or dentures?: No Does patient usually wear dentures?: No  CIWA:  CIWA-Ar Total: 1 COWS:  COWS Total Score: 1  Musculoskeletal: Strength & Muscle Tone: within normal limits Gait & Station: normal Patient leans: N/A  Psychiatric Specialty Exam: Physical Exam  Nursing  note and vitals reviewed.   Review of Systems  Psychiatric/Behavioral: Positive for depression, hallucinations, substance abuse and suicidal ideas. The patient is nervous/anxious and has insomnia.   All other systems reviewed and are negative.   Blood pressure 120/65, pulse 85, temperature 98.4 F (36.9 C), resp. rate 18, height 5\' 7"  (1.702 m), weight (!) 175.5 kg (387 lb), SpO2 100 %.Body mass index is 60.61 kg/m.  General Appearance: Guarded  Eye Contact:  Minimal  Speech:  Slow  Volume:  Decreased  Mood:  Anxious, Depressed and Dysphoric  Affect:  Congruent  Thought Process:  Linear and Descriptions of Associations: Circumstantial  Orientation:  Full (Time, Place, and Person)  Thought Content:  Hallucinations: Auditory Command:  kill and Rumination  Suicidal Thoughts:  Yes.  without intent/plan contracts for safety  Homicidal Thoughts:  Yes.  without intent/plan contracts for safety  Memory:  Immediate;   Fair Recent;   Fair Remote;   Fair  Judgement:  Impaired  Insight:  Shallow  Psychomotor Activity:  Normal  Concentration:  Concentration: Fair and Attention Span: Fair  Recall:  [employer] of Knowledge:  Fair  Language:  Fair  Akathisia:  No  Handed:  Right  AIMS (if indicated):  Assets:  Communication Skills Desire for Improvement  ADL's:  Intact  Cognition:  WNL  Sleep:  Number of Hours: 6   Will continue today 10/13/16 plan as below except where it is noted.   Treatment Plan Summary:Patient seen with severe sx of depression, as well as AH and has SI/HI , although contracts for safety. Will continue to readjust medications.  Daily contact with patient to assess and evaluate symptoms and progress in treatment and Medication management   For schizophrenia; Discontinue Latuda for lack of efficacy , patient does not want to be on it anymore. Start Geodon 40 mg po bid with meals.   For insomnia: Discontinue Doxepin for lack of efficacy. Start Ambien 5 mg  po qhs.  For morbid obesity: Discussed diet management, being active.  For substance use disorder; Provided substance abuse counseling.  For headache:reviewed - same as below Symptomatic treatment, encourage po fluids.  For diarrhea:reviewed - same as below. Bland diet, imodium.  CSW will continue to work on disposition.  Labs reviewed - Pl - elevated - will monitor on a regular basis , likely 2/2 antipsychotic therapy, hba1c- wnl.  EKG reviewed- qtc - wnl.  Anecia Nusbaum, MD 10/13/2016, 3:28 PM

## 2016-10-13 NOTE — Progress Notes (Signed)
D: Pt  Passive SI/ AH- contracts for safety denies HI/VH. Pt is pleasant and cooperative. Pt isolated this evening, but pt stated he was having issues with his AH, so pt was given PRN Haldol (5 mg) per MAR.   A: Pt was offered support and encouragement. Pt was given scheduled medications. Pt was encourage to attend groups. Q 15 minute checks were done for safety.   R: Pt has no complaints.Pt receptive to treatment and safety maintained on unit.

## 2016-10-13 NOTE — Plan of Care (Signed)
Problem: Coping: Goal: Ability to interact with others will improve Outcome: Not Progressing Pt continues to isolate the majority of the evening. Pt will engage conversation if prompted.

## 2016-10-14 MED ORDER — TRAZODONE HCL 150 MG PO TABS
150.0000 mg | ORAL_TABLET | Freq: Every evening | ORAL | Status: DC | PRN
  Administered 2016-10-14: 150 mg via ORAL
  Filled 2016-10-14: qty 14
  Filled 2016-10-14 (×2): qty 1
  Filled 2016-10-14: qty 14
  Filled 2016-10-14 (×3): qty 1

## 2016-10-14 MED ORDER — TRAZODONE HCL 100 MG PO TABS
100.0000 mg | ORAL_TABLET | Freq: Every evening | ORAL | Status: DC | PRN
  Administered 2016-10-14: 100 mg via ORAL
  Filled 2016-10-14 (×5): qty 1

## 2016-10-14 NOTE — Progress Notes (Signed)
Recreation Therapy Notes  Date: 10/14/16 Time: 1000 Location: 500 Hall Dayroom  Group Topic: Leisure Education  Goal Area(s) Addresses:  Patient will identify positive leisure activities.  Patient will identify one positive benefit of participation in leisure activities.   Intervention: Various leisure activities, dry erase marker, dry erase board, eraser  Activity: Leisure Pictionary.  Patients were to pull strips of paper from a can with a leisure activity on it.  Patients were to draw the activity they pulled on the board.  The remaining patients were to guess what the activity is.  The person who guesses the activity will then get an opportunity to pull and draw the next activity.  Education:  Leisure Education, Discharge Planning  Education Outcome: Acknowledges education/In group clarification offered/Needs additional education  Clinical Observations/Feedback: Pt did not attend group.   Diamonds Lippard, LRT/CTRS         Bruce [name] A 10/14/2016 11:59 AM 

## 2016-10-14 NOTE — Plan of Care (Signed)
Problem: Activity: Goal: Interest or engagement in leisure activities will improve Outcome: Progressing Pt. remains isolative to room but with encouragement, Pt. proceeds to sit in dayroom this evening.

## 2016-10-14 NOTE — Progress Notes (Signed)
Adult Psychoeducational Group Note  Date:  10/14/2016 Time:  10:15 PM  Group Topic/Focus:  Wrap-Up Group:   The focus of this group is to help patients review their daily goal of treatment and discuss progress on daily workbooks.  Participation Level:  Did Not Attend  Participation Quality:  Did not attend  Affect:  Did not attend  Cognitive:  Did not attend  Insight: None  Engagement in Group:  Did not attend  Modes of Intervention:  Did not attend  Additional Comments:  Patient did not attend wrap up group this evening.   Hamilton Marinello L Kawana Hegel 10/14/2016, 10:15 PM

## 2016-10-14 NOTE — Progress Notes (Signed)
  DATA ACTION RESPONSE  Objective- Pt. is up and visible in the room, laying in bed with eyes closed. Pt. presents with an anxious/depressed affect and mood. Pt. remains isolative to room but with encouragement, Pt. makes an effort to go out into the dayroom. Subjective- Denies having any HI/VH/Pain at this time. Pt. endorses +SI but verbal contracts for safety. Endorses +AH stating "I hear voices constantly telling me to hurt myself and others". Pt. states " I don't know if I can go home; I feel partially ready. I still hear these voices". Pt. continues to be cooperative and remain safe on the unit.  1:1 interaction in private to establish rapport. Encouragement, education, & support given from staff. Meds. ordered and administered. Pt. refused Ambien this evening.   Safety maintained with Q 15 checks. Continues to follow treatment plan and will monitor closely. No additonal questions/concerns noted.

## 2016-10-14 NOTE — Tx Team (Signed)
Interdisciplinary Treatment and Diagnostic Plan Update  10/14/2016 Time of Session: 10:41 AM  Bruce [name] MRN: [id number]  Principal Diagnosis: Schizophrenia [facility])  Secondary Diagnoses: Principal Problem:   Schizophrenia ([place])   Current Medications:  Current Facility-Administered Medications  Medication Dose Route Frequency Provider Last Rate Last Dose  . alum & mag hydroxide-simeth (MAALOX/MYLANTA) 200-200-20 MG/5ML suspension 15 mL  15 mL Oral Q6H PRN Ursula Alert, MD      . benztropine (COGENTIN) tablet 0.5 mg  0.5 mg Oral BID WC Lurena Nida, NP   0.5 mg at 10/14/16 0827  . benztropine (COGENTIN) tablet 1 mg  1 mg Oral Q8H PRN Ursula Alert, MD       Or  . benztropine mesylate (COGENTIN) injection 1 mg  1 mg Intramuscular Q8H PRN Saramma Eappen, MD      . fluticasone (FLONASE) 50 MCG/ACT nasal spray 2 spray  2 spray Each Nare Daily Ursula Alert, MD   2 spray at 10/14/16 0828  . haloperidol (HALDOL) tablet 5 mg  5 mg Oral Q8H PRN Ursula Alert, MD   5 mg at 10/13/16 2132   Or  . haloperidol lactate (HALDOL) injection 5 mg  5 mg Intramuscular Q8H PRN Ursula Alert, MD      . hydrOXYzine (ATARAX/VISTARIL) tablet 25 mg  25 mg Oral Q6H PRN Ursula Alert, MD   25 mg at 10/13/16 2132  . ibuprofen (ADVIL,MOTRIN) tablet 800 mg  800 mg Oral Q6H PRN Ursula Alert, MD   800 mg at 10/13/16 0824  . loperamide (IMODIUM) capsule 4 mg  4 mg Oral PRN Laverle Hobby, PA-C   4 mg at 10/13/16 2134  . loratadine (CLARITIN) tablet 10 mg  10 mg Oral Daily Ursula Alert, MD   10 mg at 10/14/16 0828  . traZODone (DESYREL) tablet 100 mg  100 mg Oral QHS,MR X 1 Spencer E Simon, PA-C   100 mg at 10/14/16 0113  . ziprasidone (GEODON) capsule 40 mg  40 mg Oral BID WC Saramma Eappen, MD   40 mg at 10/14/16 0829  . zolpidem (AMBIEN) tablet 5 mg  5 mg Oral QHS Ursula Alert, MD   5 mg at 10/13/16 2132    PTA Medications: Prescriptions Prior to Admission  Medication Sig Dispense Refill Last Dose   . acetaminophen (TYLENOL) 325 MG tablet Take 650 mg by mouth every 6 (six) hours as needed for mild pain.     . ARIPiprazole ER (ABILIFY MAINTENA) 400 MG SRER Inject 400 mg into the muscle every 28 (twenty-eight) days.     Marland Kitchen ibuprofen (ADVIL,MOTRIN) 200 MG tablet Take 800 mg by mouth every 4 (four) hours as needed for headache or mild pain.   10/07/2016 at Unknown time  . benztropine (COGENTIN) 0.5 MG tablet Take 1 tablet (0.5 mg total) by mouth 2 (two) times daily with a meal. 60 tablet 0 Not Taking at Unknown time  . doxepin (SINEQUAN) 50 MG capsule Take 1 capsule (50 mg total) by mouth at bedtime. (Patient taking differently: Take 50 mg by mouth at bedtime. ) 30 capsule 0 Not Taking at Unknown time  . gabapentin (NEURONTIN) 400 MG capsule Take 1 capsule (400 mg total) by mouth 3 (three) times daily. 90 capsule 0 Not Taking at Unknown time  . metFORMIN (GLUCOPHAGE) 500 MG tablet Take 1 tablet (500 mg total) by mouth daily with breakfast. 30 tablet 0 Not Taking at Unknown time  . OLANZapine (ZYPREXA) 20 MG tablet Take 1 tablet (20 mg  total) by mouth at bedtime. 30 tablet 0   . OLANZapine (ZYPREXA) 5 MG tablet Take 1 tablet (5 mg total) by mouth 2 (two) times daily at 7 am and 12 noon. 60 tablet 0   . perphenazine (TRILAFON) 4 MG tablet Take 1 tablet (4 mg total) by mouth at bedtime. 30 tablet 0 Not Taking at Unknown time  . venlafaxine XR (EFFEXOR-XR) 75 MG 24 hr capsule Take 3 capsules (225 mg total) by mouth daily with breakfast. 90 capsule 0 Not Taking at Unknown time    Treatment Modalities: Medication Management, Group therapy, Case management,  1 to 1 session with clinician, Psychoeducation, Recreational therapy.   Physician Treatment Plan for Primary Diagnosis: Schizophrenia ([place]) Long Term Goal(s): Improvement in symptoms so as ready for discharge  Short Term Goals: Ability to verbalize feelings will improve Ability to disclose and discuss suicidal ideas Ability to maintain clinical  measurements within normal limits will improve Compliance with prescribed medications will improve Ability to verbalize feelings will improve Ability to identify and develop effective coping behaviors will improve Ability to maintain clinical measurements within normal limits will improve  Medication Management: Evaluate patient's response, side effects, and tolerance of medication regimen.  Therapeutic Interventions: 1 to 1 sessions, Unit Group sessions and Medication administration.  Evaluation of Outcomes: Adequate for Discharge  Physician Treatment Plan for Secondary Diagnosis: Principal Problem:   Schizophrenia ([place])   Long Term Goal(s): Improvement in symptoms so as ready for discharge  Short Term Goals: Ability to verbalize feelings will improve Ability to disclose and discuss suicidal ideas Ability to maintain clinical measurements within normal limits will improve Compliance with prescribed medications will improve Ability to verbalize feelings will improve Ability to identify and develop effective coping behaviors will improve Ability to maintain clinical measurements within normal limits will improve  Medication Management: Evaluate patient's response, side effects, and tolerance of medication regimen.  Therapeutic Interventions: 1 to 1 sessions, Unit Group sessions and Medication administration.  Evaluation of Outcomes: Adequate for Discharge   RN Treatment Plan for Primary Diagnosis: Schizophrenia ([place]) Long Term Goal(s): Knowledge of disease and therapeutic regimen to maintain health will improve  Short Term Goals: Ability to identify and develop effective coping behaviors will improve and Compliance with prescribed medications will improve  Medication Management: RN will administer medications as ordered by provider, will assess and evaluate patient's response and provide education to patient for prescribed medication. RN will report any adverse and/or side  effects to prescribing provider.  Therapeutic Interventions: 1 on 1 counseling sessions, Psychoeducation, Medication administration, Evaluate responses to treatment, Monitor vital signs and CBGs as ordered, Perform/monitor CIWA, COWS, AIMS and Fall Risk screenings as ordered, Perform wound care treatments as ordered.  Evaluation of Outcomes: Adequate for Discharge   Recreational Therapy Treatment Plan for Primary Diagnosis: Schizophrenia ([place]) Long Term Goal(s): LTG- Patient will participate in recreation therapy tx in at least 2 group sessions without prompting from LRT.  Short Term Goals: Patient will be able to identify at least 5 coping skills for admitting dx by conclusion of recreation therapy tx.  Treatment Modalities: Group and Pet Therapy  Therapeutic Interventions: Psychoeducation  Evaluation of Outcomes: Progressing   LCSW Treatment Plan for Primary Diagnosis: Schizophrenia ([place]) Long Term Goal(s): Safe transition to appropriate next level of care at discharge, Engage patient in therapeutic group addressing interpersonal concerns.  Short Term Goals: Engage patient in aftercare planning with referrals and resources  Therapeutic Interventions: Assess for all discharge needs, 1 to 1 time  with Social worker, Explore available resources and support systems, Assess for adequacy in community support network, Educate family and significant other(s) on suicide prevention, Complete Psychosocial Assessment, Interpersonal group therapy.  Evaluation of Outcomes: Met     Progress in Treatment: Attending groups: Only under threat of lock out from bedroom Participating in groups: Minimal Taking medication as prescribed: Yes Toleration medication: Yes, no side effects reported at this time Family/Significant other contact made: Yes-[facility] from the [facility] Patient understands diagnosis: Yes AEB asking for help with symptoms Discussing patient identified problems/goals with staff: Yes Medical  problems stabilized or resolved: Yes Denies suicidal/homicidal ideation: Yes Issues/concerns per patient self-inventory: None Other: N/A  New problem(s) identified: None identified at this time.   New Short Term/Long Term Goal(s): None identified at this time.   Discharge Plan or Barriers:   Reason for Continuation of Hospitalization:  Hopes [facility] get into [facility] from here, follow [facility]   Medication stabilization    Estimated Length of Stay: [place] d/c tomorrow  Attendees: Patient: 10/14/2016  10:41 AM  Physician: Ursula Alert, MD 10/14/2016  10:41 AM  Nursing: Hoy Register, RN 10/14/2016  10:41 AM  RN Care Manager: Lars Pinks, RN 10/14/2016  10:41 AM  Social Worker: Ripley Fraise 10/14/2016  10:41 AM  Recreational Therapist: Laretta Bolster  10/14/2016  10:41 AM  Other: Norberto Sorenson 10/14/2016  10:41 AM  Other:  10/14/2016  10:41 AM    Scribe for Treatment Team:  Roque Lias LCSW 10/14/2016 10:41 AM

## 2016-10-14 NOTE — Progress Notes (Signed)
[facility] MD Progress Note  10/14/2016 2:29 PM Timber Lucarelli  MRN:  [id number] Subjective: Patient states " I slept but woke up after 3 hrs." Objective:Patient seen and chart reviewed.Discussed patient with treatment team. Pt was seen today and denied hearing voices of command to kill others.  He states that his mood has improved and was asking when he will be discharged.  Patient states that he may be able to get a bed at [facility], patient is homeless.    Patient is interacting in group.  There is no disruptive behaviors evident.   Denies SI, HI, AVH   Principal Problem: Schizophrenia (HCC) Diagnosis:   Patient Active Problem List   Diagnosis Date Noted  . Cocaine use disorder, mild, abuse [F14.10] 05/03/2016  . Cannabis use disorder, mild, abuse [F12.10] 05/03/2016  . Suicide ideation [R45.851] 04/30/2016  . Paranoid schizophrenia (HCC) [F20.0]   . 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 Alcohol use disorder, moderate, dependence (HCC) [F10.20] 09/22/2015  . Morbid obesity (HCC) [E66.01] 09/22/2015  . Schizophrenia (HCC) [F20.9] 09/21/2015   Total Time spent with patient: 25 minutes  Past Psychiatric History: Please see H&P.   Past Medical History:  Past Medical History:  Diagnosis Date  . Depression   . Obesity   . Schizophrenia (HCC)    History reviewed. No pertinent surgical history. Family History:  Family History  Problem Relation Age of Onset  . Schizophrenia Mother    Family Psychiatric  History: Please see H&P.  Social History: Please see H&P.  History  Alcohol Use  . Yes     History  Drug Use  . Types: Marijuana, "Crack" cocaine, Cocaine    Social History   Social History  . Marital status: Single    Spouse name: N/A  . Number of children: N/A  . Years of education: N/A   Social History Main Topics  . Smoking status: Never Smoker  . Smokeless tobacco: Never Used  . Alcohol use Yes  . Drug use: Yes    Types: Marijuana, "Crack" cocaine,  Cocaine  . Sexual activity: Not Currently   Other Topics Concern  . None   Social History Narrative  . None   Additional Social History:                         Sleep: poor  Appetite:  Fair  Current Medications: Current Facility-Administered Medications  Medication Dose Route Frequency Provider Last Rate Last Dose  . alum & mag hydroxide-simeth (MAALOX/MYLANTA) 200-200-20 MG/5ML suspension 15 mL  15 mL Oral Q6H PRN Jomarie Longs, MD      . benztropine (COGENTIN) tablet 0.5 mg  0.5 mg Oral BID WC Kristeen Mans, NP   0.5 mg at 10/14/16 0827  . benztropine (COGENTIN) tablet 1 mg  1 mg Oral Q8H PRN Jomarie Longs, MD       Or  . benztropine mesylate (COGENTIN) injection 1 mg  1 mg Intramuscular Q8H PRN Saramma Eappen, MD      . fluticasone (FLONASE) 50 MCG/ACT nasal spray 2 spray  2 spray Each Nare Daily Saramma Eappen, MD   2 spray at 10/14/16 0828  . haloperidol (HALDOL) tablet 5 mg  5 mg Oral Q8H PRN Jomarie Longs, MD   5 mg at 10/13/16 2132   Or  . haloperidol lactate (HALDOL) injection 5 mg  5 mg Intramuscular Q8H PRN Jomarie Longs, MD      . hydrOXYzine (ATARAX/VISTARIL) tablet 25 mg  25 mg Oral Q6H PRN Jomarie Longs, MD   25 mg at 10/13/16 2132  . ibuprofen (ADVIL,MOTRIN) tablet 800 mg  800 mg Oral Q6H PRN Jomarie Longs, MD   800 mg at 10/13/16 0824  . loperamide (IMODIUM) capsule 4 mg  4 mg Oral PRN Kerry Hough, PA-C   4 mg at 10/13/16 2134  . loratadine (CLARITIN) tablet 10 mg  10 mg Oral Daily Jomarie Longs, MD   10 mg at 10/14/16 0828  . traZODone (DESYREL) tablet 100 mg  100 mg Oral QHS,MR X 1 Spencer E Simon, PA-C   100 mg at 10/14/16 0113  . ziprasidone (GEODON) capsule 40 mg  40 mg Oral BID WC Saramma Eappen, MD   40 mg at 10/14/16 0829  . zolpidem (AMBIEN) tablet 5 mg  5 mg Oral QHS Jomarie Longs, MD   5 mg at 10/13/16 2132    Lab Results:  No results found for this or any previous visit (from the past 48 hour(s)).  Blood Alcohol level:  Lab  Results  Component Value Date   ETH <5 10/08/2016   ETH <5 04/29/2016    Metabolic Disorder Labs: Lab Results  Component Value Date   HGBA1C 5.2 10/12/2016   MPG 103 10/12/2016   MPG 108 09/26/2015   Lab Results  Component Value Date   PROLACTIN 63.9 (H) 10/12/2016   PROLACTIN 19.9 (H) 05/06/2016   Lab Results  Component Value Date   CHOL 143 10/12/2016   TRIG 70 10/12/2016   HDL 41 10/12/2016   CHOLHDL 3.5 10/12/2016   VLDL 14 10/12/2016   LDLCALC 88 10/12/2016   LDLCALC 103 (H) 09/26/2015    Physical Findings: AIMS: Facial and Oral Movements Muscles of Facial Expression: None, normal Lips and Perioral Area: None, normal Jaw: None, normal Tongue: None, normal,Extremity Movements Upper (arms, wrists, hands, fingers): None, normal Lower (legs, knees, ankles, toes): None, normal, Trunk Movements Neck, shoulders, hips: None, normal, Overall Severity Severity of abnormal movements (highest score from questions above): None, normal Incapacitation due to abnormal movements: None, normal Patient's awareness of abnormal movements (rate only patient's report): No Awareness, Dental Status Current problems with teeth and/or dentures?: No Does patient usually wear dentures?: No  CIWA:  CIWA-Ar Total: 1 COWS:  COWS Total Score: 1  Musculoskeletal: Strength & Muscle Tone: within normal limits Gait & Station: normal Patient leans: N/A  Psychiatric Specialty Exam: Physical Exam  Nursing note and vitals reviewed. Psychiatric: He has a normal mood and affect. His speech is normal and behavior is normal. Judgment and thought content normal. Cognition and memory are normal.    Review of Systems  Psychiatric/Behavioral: Positive for depression, hallucinations, substance abuse and suicidal ideas. The patient is nervous/anxious and has insomnia.   All other systems reviewed and are negative.   Blood pressure 132/73, pulse 88, temperature 99.7 F (37.6 C), temperature source  Oral, resp. rate 20, height 5\' 7"  (1.702 m), weight (!) 175.5 kg (387 lb), SpO2 100 %.Body mass index is 60.61 kg/m.  General Appearance: Guarded  Eye Contact:  Minimal  Speech:  Slow  Volume:  Decreased  Mood:  Anxious, Depressed and Dysphoric  Affect:  Congruent  Thought Process:  Linear and Descriptions of Associations: Circumstantial  Orientation:  Full (Time, Place, and Person)  Thought Content:  Hallucinations: Auditory Command:  kill and Rumination  Suicidal Thoughts:  No contracts for safety  Homicidal Thoughts:  No contracts for safety  Memory:  Immediate;   Fair Recent;  Fair Remote;   Fair  Judgement:  Impaired  Insight:  Shallow  Psychomotor Activity:  Normal  Concentration:  Concentration: Fair and Attention Span: Fair  Recall:  FiservFair  Fund of Knowledge:  Fair  Language:  Fair  Akathisia:  No  Handed:  Right  AIMS (if indicated):     Assets:  Communication Skills Desire for Improvement  ADL's:  Intact  Cognition:  WNL  Sleep:  Number of Hours: 4   Will continue today 10/14/16 plan as below except where it is noted.  Treatment Plan Summary:  No s/s depression evident today, as well as no AH and denies SI/HI , although contracts for safety. Will continue to readjust medications.  Daily contact with patient to assess and evaluate symptoms and progress in treatment and Medication management   For schizophrenia; Discontinue Latuda for lack of efficacy , patient does not want to be on it anymore. Start Geodon 40 mg po bid with meals.  For insomnia: Discontinue Doxepin for lack of efficacy. Start Ambien 5 mg po qhs.  For morbid obesity: Discussed diet management, being active.  For substance use disorder; Provided substance abuse counseling.  For headache:reviewed - same as below Symptomatic treatment, encourage po fluids.  For diarrhea:reviewed - same as below. Bland diet, imodium.  CSW will continue to work on disposition.  Labs reviewed - Pl -  elevated - will monitor on a regular basis , likely 2/2 antipsychotic therapy, hba1c- wnl.  Lindwood QuaSheila May Jullisa Grigoryan, NP [facility] 10/14/2016, 2:29 PM

## 2016-10-14 NOTE — [facility] Group Notes (Signed)
Type of Therapy:  Group Therapy   Participation Level:  Engaged  Participation Quality:  Attentive  Affect:  Appropriate   Cognitive:  Alert   Insight:  Engaged  Engagement in Therapy:  Improving   Mode s of Intervention:  Education, Exploration, Socialization   Summary of Progress/Problems: Bruce [name] came to group about 15 minutes late, however he was engaged throughout his stay.   Tammi from [employer] was here to tell her story of recovery and inform patients about [facility] and their services.

## 2016-10-15 MED ORDER — HYDROXYZINE HCL 25 MG PO TABS: 25.0000 mg | ORAL_TABLET | Freq: Four times a day (QID) | ORAL | 0 refills | Status: DC | PRN

## 2016-10-15 MED ORDER — BENZTROPINE MESYLATE 0.5 MG PO TABS: 0.5000 mg | ORAL_TABLET | Freq: Two times a day (BID) | ORAL | 0 refills | Status: DC

## 2016-10-15 MED ORDER — ZIPRASIDONE HCL 40 MG PO CAPS: 40.0000 mg | ORAL_CAPSULE | Freq: Two times a day (BID) | ORAL | 0 refills | Status: DC

## 2016-10-15 MED ORDER — TRAZODONE HCL 150 MG PO TABS: 150.0000 mg | ORAL_TABLET | Freq: Every evening | ORAL | 0 refills | Status: DC | PRN

## 2016-10-15 NOTE — Progress Notes (Signed)
Patient to discharged to lobby. All discharge paperwork given and signed, valuables and other belongings  returned. Prescriptions given. Patient able to verbalize understanding. Patient stable, denies SI/HI/AVH. Patient given opportunity to express concerns and ask questions.

## 2016-10-15 NOTE — Plan of Care (Signed)
[facility] The[facility] Inter[facility] Goal: STG-Patient will identify at least five coping skills for ** STG: Coping Skills - Patient will be able to identify at least 5 coping skills for hearing voices by conclusion of recreation therapy tx  Outcome: Adequate for Discharge Pt was able to identify a few coping skills at completion of recreation therapy session.  Caroll RancherMarjette Geovanni [place], LRT/CTRS

## 2016-10-15 NOTE — Discharge Summary (Signed)
Physician Discharge Summary Note  Patient:  Bruce [name] is an 41 y.o., male MRN:  [id number] DOB:  [birth year] Patient phone:  [phone] (home)  [place] [id number],  Total Time spent with patient: 30 minutes  Date of Admission:  10/09/2016 Date of Discharge: 10/15/2016  Reason for Admission:  Hearing command voices  Principal Problem: Schizophrenia [facility](HCC) Discharge Diagnoses: Patient Active Problem List   Diagnosis Date Noted  . Cocaine use disorder, mild, abuse [F14.10] 05/03/2016  . Cannabis use disorder, mild, abuse [F12.10] 05/03/2016  . Suicide ideation [R45.851] 04/30/2016  . Paranoid schizophrenia (HCC) [F20.0]   . 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 Alcohol use disorder, moderate, dependence (HCC) [F10.20] 09/22/2015  . Morbid obesity (HCC) [E66.01] 09/22/2015  . Schizophrenia (HCC) [F20.9] 09/21/2015    Past Psychiatric History: see HPI  Past Medical History:  Past Medical History:  Diagnosis Date  . Depression   . Obesity   . Schizophrenia (HCC)    History reviewed. No pertinent surgical history. Family History:  Family History  Problem Relation Age of Onset  . Schizophrenia Mother    Family Psychiatric  History: see HPI Social History:  History  Alcohol Use  . Yes     History  Drug Use  . Types: Marijuana, "Crack" cocaine, Cocaine    Social History   Social History  . Marital status: Single    Spouse name: N/A  . Number of children: N/A  . Years of education: N/A   Social History Main Topics  . Smoking status: Never Smoker  . Smokeless tobacco: Never Used  . Alcohol use Yes  . Drug use: Yes    Types: Marijuana, "Crack" cocaine, Cocaine  . Sexual activity: Not Currently   Other Topics Concern  . None   Social History Narrative  . None    Hospital Course:  Nena AlexanderDwight Salteris a 41 y.o.malewho [facility]  Pt indicated that he has been experiencing command voices  for years, telling him to kill himself and others. Pt has been on several different medications, but says that none of them have helped to mitigate the hallucinations.  Bruce OrganDwight [name] was admitted for Schizophrenia [facility](HCC) and crisis management.  Patient was treated with medications with their indications listed below in detail under Medication List.  Medical problems were identified and treated as needed.  Home medications were restarted as appropriate.  Improvement was monitored by observation and Bruce [name] daily report of symptom reduction.  Emotional and mental status was monitored by daily self inventory reports completed by Bruce [name] and clinical staff.  Patient reported continued improvement, denied any new concerns.  Patient had been compliant on medications and denied side effects.  Support and encouragement was provided.         Bruce [name] was evaluated by the treatment team for stability and plans for continued recovery upon discharge.  Patient was offered further treatment options upon discharge including Residential, Intensive Outpatient and Outpatient treatment. Patient will follow up with agency listed below for medication management and counseling.  Encouraged patient to maintain satisfactory support network and home environment.  Advised to adhere to medication compliance and outpatient treatment follow up.  Prescriptions provided.       Bruce [name] motivation was an integral factor for scheduling further treatment.  Employment, transportation, bed availability, health status, family support, and any pending legal issues were also considered during patient's hospital stay.  Upon completion of this admission the  patient was both mentally and medically stable for discharge denying suicidal/homicidal ideation, auditory/visual/tactile hallucinations, delusional thoughts and paranoia.       Physical Findings: AIMS: Facial and Oral Movements Muscles of Facial Expression: None,  normal Lips and Perioral Area: None, normal Jaw: None, normal Tongue: None, normal,Extremity Movements Upper (arms, wrists, hands, fingers): None, normal Lower (legs, knees, ankles, toes): None, normal, Trunk Movements Neck, shoulders, hips: None, normal, Overall Severity Severity of abnormal movements (highest score from questions above): None, normal Incapacitation due to abnormal movements: None, normal Patient's awareness of abnormal movements (rate only patient's report): No Awareness, Dental Status Current problems with teeth and/or dentures?: No Does patient usually wear dentures?: No  CIWA:  CIWA-Ar Total: 1 COWS:  COWS Total Score: 1  Musculoskeletal: Strength & Muscle Tone: within normal limits Gait & Station: normal Patient leans: N/A  Psychiatric Specialty Exam: Physical Exam  Nursing note and vitals reviewed.   ROS  Blood pressure 128/74, pulse 91, temperature 99.3 F (37.4 C), temperature source Oral, resp. rate 18, height 5\' 7"  (1.702 m), weight (!) 175.5 kg (387 lb), SpO2 100 %.Body mass index is 60.61 kg/m.    Have you used any form of tobacco in the last 30 days? (Cigarettes, Smokeless Tobacco, Cigars, and/or Pipes): No  Has this patient used any form of tobacco in the last 30 days? (Cigarettes, Smokeless Tobacco, Cigars, and/or Pipes) Yes, N/A  Blood Alcohol level:  Lab Results  Component Value Date   ETH <5 10/08/2016   ETH <5 04/29/2016    Metabolic Disorder Labs:  Lab Results  Component Value Date   HGBA1C 5.2 10/12/2016   MPG 103 10/12/2016   MPG 108 09/26/2015   Lab Results  Component Value Date   PROLACTIN 63.9 (H) 10/12/2016   PROLACTIN 19.9 (H) 05/06/2016   Lab Results  Component Value Date   CHOL 143 10/12/2016   TRIG 70 10/12/2016   HDL 41 10/12/2016   CHOLHDL 3.5 10/12/2016   VLDL 14 10/12/2016   LDLCALC 88 10/12/2016   LDLCALC 103 (H) 09/26/2015    See Psychiatric Specialty Exam and Suicide Risk Assessment completed by  Attending Physician prior to discharge.  Discharge destination:  Home  Is patient on multiple antipsychotic therapies at discharge:  No   Has Patient had three or more failed trials of antipsychotic monotherapy by history:  No  Recommended Plan for Multiple Antipsychotic Therapies: NA   Allergies as of 10/15/2016   No Known Allergies     Medication List    STOP taking these medications   ABILIFY MAINTENA 400 MG Srer Generic drug:  ARIPiprazole ER   acetaminophen 325 MG tablet Commonly known as:  TYLENOL   doxepin 50 MG capsule Commonly known as:  SINEQUAN   gabapentin 400 MG capsule Commonly known as:  NEURONTIN   ibuprofen 200 MG tablet Commonly known as:  ADVIL,MOTRIN   metFORMIN 500 MG tablet Commonly known as:  GLUCOPHAGE   OLANZapine 20 MG tablet Commonly known as:  ZYPREXA   OLANZapine 5 MG tablet Commonly known as:  ZYPREXA   perphenazine 4 MG tablet Commonly known as:  TRILAFON   venlafaxine XR 75 MG 24 hr capsule Commonly known as:  EFFEXOR-XR     TAKE these medications     Indication  benztropine 0.5 MG tablet Commonly known as:  COGENTIN Take 1 tablet (0.5 mg total) by mouth 2 (two) times daily with a meal.  Indication:  Extrapyramidal Reaction caused by Medications   hydrOXYzine  25 MG tablet Commonly known as:  ATARAX/VISTARIL Take 1 tablet (25 mg total) by mouth every 6 (six) hours as needed for anxiety.  Indication:  Anxiety Neurosis   traZODone 150 MG tablet Commonly known as:  DESYREL Take 1 tablet (150 mg total) by mouth at bedtime and may repeat dose one time if needed.  Indication:  Trouble Sleeping   ziprasidone 40 MG capsule Commonly known as:  GEODON Take 1 capsule (40 mg total) by mouth 2 (two) times daily with a meal.  Indication:  mood stabilization      Follow-up Information    [facility] Follow up.   Why:  Go to the walk-in clinic within 5 days of d/c-M-F between 8:30 and 2:30 Contact  information: [address] [place], [place]  [address] [phone]          Follow-up recommendations:  Activity:  as tol Diet:  as tol  Comments:  1.  Take all your medications as prescribed.   2.  Report any adverse side effects to outpatient provider. 3.  Patient instructed to not use alcohol or illegal drugs while on prescription medicines. 4.  In the event of worsening symptoms, instructed patient to call 911, the crisis hotline or go to nearest emergency room for evaluation of symptoms.  Signed: Lindwood Qua, NP [facility] 10/15/2016, 1:32 PM

## 2016-10-15 NOTE — B[facility] Suicide Risk Assessment (Signed)
[facility]   Principal Problem: Schizophrenia [facility](HCC) Discharge Diagnoses:  Patient Active Problem List   Diagnosis Date Noted  . Cocaine use disorder, mild, abuse [F14.10] 05/03/2016  . Cannabis use disorder, mild, abuse [F12.10] 05/03/2016  . Suicide ideation [R45.851] 04/30/2016  . Paranoid schizophrenia (HCC) [F20.0]   . 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 Alcohol use disorder, moderate, dependence (HCC) [F10.20] 09/22/2015  . Morbid obesity (HCC) [E66.01] 09/22/2015  . Schizophrenia (HCC) [F20.9] 09/21/2015    Total Time spent with patient: 30 minutes  Musculoskeletal: Strength & Muscle Tone: within normal limits Gait & Station: normal Patient leans: N/A  Psychiatric Specialty Exam: Review of Systems  Psychiatric/Behavioral: Negative for depression and suicidal ideas. The patient is not nervous/anxious.   All other systems reviewed and are negative.   Blood pressure 128/74, pulse 91, temperature 99.3 F (37.4 C), temperature source Oral, resp. rate 18, height 5\' 7"  (1.702 m), weight (!) 175.5 kg (387 lb), SpO2 100 %.Body mass index is 60.61 kg/m.  General Appearance: Casual  Eye Contact::  Fair  Speech:  Clear and Coherent409  Volume:  Normal  Mood:  Euthymic  Affect:  Appropriate  Thought Process:  Goal Directed and Descriptions of Associations: Intact  Orientation:  Full (Time, Place, and Person)  Thought Content:  Logical and Hallucinations: patient has chronic AH that never goes away , pt has command AH , does not elaborate , but states he is able to ignore and cope with it since they are chronic and persistent  Suicidal Thoughts:  No  Homicidal Thoughts:  No  Memory:  Immediate;   Fair Recent;   Fair Remote;   Fair  Judgement:  Fair  Insight:  Shallow  Psychomotor Activity:  Normal  Concentration:  Fair  Recall:  Fis[employer]Fair  Fund of Knowledge:Fair  Language: Fair  Akathisia:  No  Handed:  Right  AIMS (if indicated):    denies any side effects /tremors  Assets:  Desire for Improvement  Sleep:  Number of Hours: 6.25  Cognition: WNL  ADL's:  Intact   Mental Status Per Nursing Assessment::   On Admission:  Self-harm thoughts  Demographic Factors:  Male  Loss Factors: NA  Historical Factors: Impulsivity  Risk Reduction Factors:   Positive social support and Positive therapeutic relationship  Continued Clinical Symptoms:  Previous Psychiatric Diagnoses and Treatments Medical Diagnoses and Treatments/Surgeries  Cognitive Features That Contribute To Risk:  None    Suicide Risk:  Minimal: No identifiable suicidal ideation.  Patients presenting with no risk factors but with morbid ruminations; may be classified as minimal risk based on the severity of the depressive symptoms  Follow-up Information    [facility].   Why:  Go to the walk-in clinic within 5 days of d/c-M-F between 8:30 and 2:30 Contact information: [address] [place], [place]  [id number] [phone]          Plan Of Care/Follow-up recommendations:  Activity:  no restrictions Diet:  low carb, low fat diet  Tests:  follow up on your prolactin level Other:  none  Kanchan Gal, MD 10/15/2016, 9:34 AM

## 2016-10-15 NOTE — Progress Notes (Signed)
Recreation Therapy Notes  Date: 10/15/26 Time: 1000 Location: 500 Hall Dayroom  Group Topic: Communication, Team Building, Problem Solving  Goal Area(s) Addresses:  Patient will effectively work with peer towards shared goal.  Patient will identify skill used to make activity successful.  Patient will identify how skills used during activity can be used to reach post d/c goals.   Intervention: STEM Activity   Activity: Pipe Cleaner Tower. In teams, patients were asked to build the tallest freestanding tower possible out of 15 pipe cleaners. Systematically resources were removed, for example patient ability to use both hands and patient ability to verbally communicate.    Education:Social Skills, Discharge Planning.   Education Outcome: Acknowledges education/In group clarification offered/Needs additional education.   Clinical Observations/Feedback: Pt did not attend group.   Jamarri Vuncannon, LRT/CTRS         Jared Cahn A 10/15/2016 12:10 PM 

## 2016-10-15 NOTE — Progress Notes (Addendum)
  [facility] Discharge [facility] :  Will you be returning to the same living situation after discharge:  No. At discharge, do you have transportation home?: Yes,  bus pass Do you have the ability to pay for your medications: Yes,  mental health  Release of information consent forms completed and in the chart;  [name] needed at discharge.  Patient to Follow up at: Follow-up Information    [facility].   Why:  Go to the walk-in clinic within 5 days of d/c-M-F between 8:30 and 2:30 Contact information: [address] [place], [place]  [id number] [phone]          Next level of care provider has access to [facility] Link:no  Safety Planning and Suicide Prevention discussed: Yes,  yes  Have you used any form of tobacco in the last 30 days? (Cigarettes, Smokeless Tobacco, Cigars, and/or Pipes): No  Has patient been referred to the Quitline?: N/A patient is not a smoker  Patient has been referred for addiction treatment: Refused referral  Ida RogueRodney B Ananda Sitzer 10/15/2016, 10:03 AM

## 2016-10-30 ENCOUNTER — Encounter (HOSPITAL_COMMUNITY): Payer: Self-pay | Admitting: Emergency Medicine

## 2016-10-30 ENCOUNTER — Emergency Department (HOSPITAL_COMMUNITY)
Admission: EM | Admit: 2016-10-30 | Discharge: 2016-11-02 | Disposition: A | Discharge status: Home / Self Care | Payer: Medicaid Other | Attending: Emergency Medicine | Admitting: Emergency Medicine

## 2016-10-30 DIAGNOSIS — F1410 Cocaine abuse, uncomplicated: Secondary | ICD-10-CM | POA: Diagnosis not present

## 2016-10-30 DIAGNOSIS — R45850 Homicidal ideations: Secondary | ICD-10-CM

## 2016-10-30 DIAGNOSIS — F209 Schizophrenia, unspecified: Secondary | ICD-10-CM | POA: Diagnosis present

## 2016-10-30 DIAGNOSIS — Z79899 Other long term (current) drug therapy: Secondary | ICD-10-CM | POA: Diagnosis not present

## 2016-10-30 DIAGNOSIS — R45851 Suicidal ideations: Secondary | ICD-10-CM

## 2016-10-30 DIAGNOSIS — F200 Paranoid schizophrenia: Secondary | ICD-10-CM | POA: Diagnosis not present

## 2016-10-30 DIAGNOSIS — F17210 Nicotine dependence, cigarettes, uncomplicated: Secondary | ICD-10-CM | POA: Diagnosis not present

## 2016-10-30 DIAGNOSIS — R440 Auditory hallucinations: Secondary | ICD-10-CM | POA: Diagnosis not present

## 2016-10-30 DIAGNOSIS — F1290 Cannabis use, unspecified, uncomplicated: Secondary | ICD-10-CM | POA: Diagnosis not present

## 2016-10-30 LAB — RAPID URINE DRUG SCREEN, HOSP PERFORMED
AMPHETAMINES: NOT DETECTED
BARBITURATES: NOT DETECTED
Benzodiazepines: NOT DETECTED
Cocaine: POSITIVE — AB
Opiates: NOT DETECTED
TETRAHYDROCANNABINOL: NOT DETECTED

## 2016-10-30 LAB — COMPREHENSIVE METABOLIC PANEL
ALK PHOS: 60 U/L (ref 38–126)
ALT: 17 U/L (ref 17–63)
AST: 20 U/L (ref 15–41)
Albumin: 3.4 g/dL — ABNORMAL LOW (ref 3.5–5.0)
Anion gap: 7 (ref 5–15)
BUN: 12 mg/dL (ref 6–20)
CALCIUM: 9.2 mg/dL (ref 8.9–10.3)
CHLORIDE: 104 mmol/L (ref 101–111)
CO2: 28 mmol/L (ref 22–32)
CREATININE: 1.14 mg/dL (ref 0.61–1.24)
GFR calc non Af Amer: >60 mL/min (ref >60)
GLUCOSE: 79 mg/dL (ref 65–99)
Potassium: 4.1 mmol/L (ref 3.5–5.1)
SODIUM: 139 mmol/L (ref 135–145)
Total Bilirubin: 0.6 mg/dL (ref 0.3–1.2)
Total Protein: 8 g/dL (ref 6.5–8.1)

## 2016-10-30 LAB — CBC
HEMATOCRIT: 40.7 % (ref 39.0–52.0)
HEMOGLOBIN: 12.7 g/dL — AB (ref 13.0–17.0)
MCH: 28 pg (ref 26.0–34.0)
MCHC: 31.2 g/dL (ref 30.0–36.0)
MCV: 89.8 fL (ref 78.0–100.0)
Platelets: 291 10*3/uL (ref 150–400)
RBC: 4.53 MIL/uL (ref 4.22–5.81)
RDW: 14.6 % (ref 11.5–15.5)
WBC: 6.4 10*3/uL (ref 4.0–10.5)

## 2016-10-30 LAB — ETHANOL: Alcohol, Ethyl (B): <5 mg/dL (ref <5)

## 2016-10-30 LAB — SALICYLATE LEVEL

## 2016-10-30 LAB — ACETAMINOPHEN LEVEL: Acetaminophen (Tylenol), Serum: <10 ug/mL — ABNORMAL LOW (ref 10–30)

## 2016-10-30 MED ORDER — BENZTROPINE MESYLATE 1 MG PO TABS
0.5000 mg | ORAL_TABLET | Freq: Two times a day (BID) | ORAL | Status: DC
  Administered 2016-10-30 – 2016-11-02 (×6): 0.5 mg via ORAL
  Filled 2016-10-30 (×6): qty 1

## 2016-10-30 MED ORDER — ZIPRASIDONE HCL 20 MG PO CAPS
40.0000 mg | ORAL_CAPSULE | Freq: Two times a day (BID) | ORAL | Status: DC
  Administered 2016-10-30 – 2016-11-02 (×6): 40 mg via ORAL
  Filled 2016-10-30: qty 1
  Filled 2016-10-30 (×5): qty 2
  Filled 2016-10-30: qty 1
  Filled 2016-10-30: qty 2

## 2016-10-30 MED ORDER — HYDROXYZINE HCL 25 MG PO TABS
25.0000 mg | ORAL_TABLET | Freq: Four times a day (QID) | ORAL | Status: DC | PRN
  Administered 2016-11-01: 25 mg via ORAL
  Filled 2016-10-30: qty 1

## 2016-10-30 MED ORDER — TRAZODONE HCL 100 MG PO TABS
150.0000 mg | ORAL_TABLET | Freq: Every evening | ORAL | Status: DC | PRN
  Administered 2016-10-31: 150 mg via ORAL
  Filled 2016-10-30: qty 1

## 2016-10-30 NOTE — ED Notes (Signed)
Pt eating dinner at this time
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
## 2016-10-30 NOTE — ED Notes (Signed)
Unable to find scrubs that fit patient. Patient, at this time, in gown.

## 2016-10-30 NOTE — ED Triage Notes (Signed)
Pt. Stated, I feel homicidal and homicidal for 2 days.

## 2016-10-30 NOTE — ED Notes (Signed)
Dinner tray ordered for patient.
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
## 2016-10-30 NOTE — ED Notes (Signed)
TTS at bedside. 

## 2016-10-30 NOTE — ED Notes (Signed)
Sitter at bedside.

## 2016-10-30 NOTE — ED Notes (Signed)
Contacted House coverage, at this time, there is no sitter available so having to use tech from ED.

## 2016-10-31 MED ORDER — LOPERAMIDE HCL 2 MG PO CAPS
2.0000 mg | ORAL_CAPSULE | Freq: Four times a day (QID) | ORAL | Status: DC | PRN
  Administered 2016-10-31 – 2016-11-01 (×3): 2 mg via ORAL
  Filled 2016-10-31 (×3): qty 1

## 2016-10-31 MED ORDER — DOCUSATE SODIUM 100 MG PO CAPS
100.0000 mg | ORAL_CAPSULE | Freq: Two times a day (BID) | ORAL | Status: DC | PRN
  Filled 2016-10-31: qty 1

## 2016-10-31 NOTE — ED Notes (Signed)
Lying on bed watching tv. Aware of re-TTS to be performed.

## 2016-10-31 NOTE — BH Assessment (Signed)
CSW completed reassessment. Pt continues to endorse SI, HI and AH. He reports voices are telling him to harm himself and others. He does not currently have a plan for SI or HI.   Bruce [name] MSW, LCSWA  10/31/2016 12:41 PM

## 2016-10-31 NOTE — ED Notes (Signed)
Pt states that he has had diarrhea x 7 today. Stating that he thought "diarrhea and constipation were the same thing"

## 2016-10-31 NOTE — ED Notes (Signed)
Patient watching TV.

## 2016-10-31 NOTE — ED Notes (Signed)
Patient sleeping

## 2016-10-31 NOTE — Progress Notes (Signed)
Patient has been referred to inpatient treatment at: [facility], [facility], and [facility].  At capacity: [facility], [facility], [facility], [facility], and [facility].  CSW in disposition will continue to seek placement.  Mel[place] Abtsatia Ngoc Detjen, LCSWA Disposition staff 10/31/2016 12:58 PM

## 2016-10-31 NOTE — ED Notes (Signed)
States AH becoming worse. Meds given. Pt appeared calmer when RN advised [facility].

## 2016-10-31 NOTE — ED Notes (Signed)
Pt requesting medication for constipation

## 2016-11-01 MED ORDER — DICYCLOMINE HCL 10 MG PO CAPS
10.0000 mg | ORAL_CAPSULE | Freq: Once | ORAL | Status: AC
  Administered 2016-11-01: 10 mg via ORAL
  Filled 2016-11-01: qty 1

## 2016-11-01 MED ORDER — ACETAMINOPHEN 325 MG PO TABS
650.0000 mg | ORAL_TABLET | Freq: Once | ORAL | Status: AC
  Administered 2016-11-01: 650 mg via ORAL
  Filled 2016-11-01: qty 2

## 2016-11-01 MED ORDER — GI COCKTAIL ~~LOC~~
30.0000 mL | Freq: Once | ORAL | Status: AC
  Administered 2016-11-01: 30 mL via ORAL
  Filled 2016-11-01: qty 30

## 2016-11-01 MED ORDER — DIPHENOXYLATE-ATROPINE 2.5-0.025 MG PO TABS
1.0000 | ORAL_TABLET | Freq: Once | ORAL | Status: AC
  Administered 2016-11-01: 1 via ORAL
  Filled 2016-11-01: qty 1

## 2016-11-01 NOTE — ED Notes (Signed)
Patient ambulated to restroom again c/o continued diarrhea.  Explained it will take some time for the medication to be effective.  Patient voiced understanding.

## 2016-11-01 NOTE — ED Triage Notes (Signed)
Pt. reports he started to ha liquid stools after he was restarted on his meds. Pt reports he did not have liquid stools before this admission.

## 2016-11-01 NOTE — ED Notes (Signed)
Pt sleeping. 

## 2016-11-01 NOTE — ED Notes (Signed)
Regular diet was ordered for breakfast. 

## 2016-11-01 NOTE — ED Notes (Signed)
Patient sitting on side of bed falling asleep with head on bedside table.  Encouraged patient to lay back to prevent falling over.  Patient compliant. Vitals taken and patient helped back to bed.

## 2016-11-01 NOTE — ED Notes (Signed)
Supper order taken. Patient ambulated to restroom without difficultly.  States having stomach pain and diarrhea, Judeth Cornfieldasey Woodruff RN notified.

## 2016-11-01 NOTE — BH Assessment (Signed)
[facility] Assessment Progress Note Reassessment--Pt states that he continues to hear voices, has stomach pain. He continues to want help. Pt was oriented x 4 and his mood and affect were depressed and congruent.  TTS to continue seeking treatment.

## 2016-11-02 DIAGNOSIS — Z818 Family history of other mental and behavioral disorders: Secondary | ICD-10-CM

## 2016-11-02 DIAGNOSIS — F17210 Nicotine dependence, cigarettes, uncomplicated: Secondary | ICD-10-CM | POA: Diagnosis not present

## 2016-11-02 DIAGNOSIS — F1410 Cocaine abuse, uncomplicated: Secondary | ICD-10-CM | POA: Diagnosis not present

## 2016-11-02 DIAGNOSIS — F1290 Cannabis use, unspecified, uncomplicated: Secondary | ICD-10-CM | POA: Diagnosis not present

## 2016-11-02 DIAGNOSIS — F200 Paranoid schizophrenia: Secondary | ICD-10-CM | POA: Diagnosis not present

## 2016-11-02 MED ORDER — ALUM & MAG HYDROXIDE-SIMETH 200-200-20 MG/5ML PO SUSP
30.0000 mL | ORAL | Status: DC | PRN
  Administered 2016-11-02: 30 mL via ORAL
  Filled 2016-11-02: qty 30

## 2016-11-02 MED ORDER — GI COCKTAIL ~~LOC~~
30.0000 mL | Freq: Once | ORAL | Status: AC
  Administered 2016-11-02: 30 mL via ORAL
  Filled 2016-11-02: qty 30

## 2016-11-02 NOTE — ED Notes (Signed)
Declined W/C at D/C and was escorted to lobby by RN. 

## 2016-11-02 NOTE — ED Notes (Signed)
Pt eating breakfast tray 

## 2016-11-02 NOTE — ED Notes (Signed)
Snack given. Pt in bathroom.
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
## 2016-11-02 NOTE — ED Notes (Signed)
C Winthrow, DNP, in w/pt. 

## 2016-11-02 NOTE — ED Notes (Signed)
Pt eating lunch tray  

## 2016-11-02 NOTE — Discharge Instructions (Signed)
Substance Abuse Treatment Programs ° °Intensive Outpatient Programs °[facility]     °[address]      °[place], [place]                   °[phone]      ° °[facility] °[address] °[place], [place] °336-379-7146 ° °[facility]     °(Inpatient and outpatient)     °[address]           °[phone]   ° °[facility] °336-288-1484 (Suboxone and Methadone) ° °[address]      °[place], [place] [address]      °336-882-2125      ° °[address] °[place], [place] °852-3033 ° °[facility] (Outpatient/Inpatient, Chemical)    °(insurance only) [phone]      °       °[facility] (Groups & Residential) °[place], [place] °336-389-1413 ° °   °[facility]     °[address]     °[place], [place]      °[phone]      ° °[facility] (for caregivers and family) °[address] °[place], [place] °336-299-4655 ° ° ° ° ° °Residential Treatment Programs °[facility]      °[address], [place], [place] [address]  °([phone]      ° °[facility] °[address] [place], [place] [address] °[phone] ° °[facility]        °336-248-8914      ° °[facility] °1-800-659-3381 ° °[facility]             °[address]                                         °[place], [place]                                                °877-615-2722 or 336-784-9470                              ° °[facility] [place] °[address] °[place], [address] °1.877.941.8954 ° °[facility]    °[address]      °[place], [place]     °[phone]      ° °[facility] °[address] °[place], [place] °336-285-9073 ° °[facility]   °[address]     °[place], [place] [address]     °336-899-1550      °Admissions: 8am-3pm M-F ° °[facility] °[address] °[place],  [place] °336-227-7417 ° °[facility]: Residential Program (90 Days)   °[place], [place]      °336-725-8389 or 800-758-6077    ° °[facility] °[place], [place] °(Walk in Hours over the weekend or by referral) ° °[facility] °[address], [place], [place] [address] °(336) [phone] ° °Crisis Mobile: [facility]:  1-877-626-1772 (for crisis response 24 hours a day) °[facility] Hotline:      1-800-256-2452 °Outpatient Psychiatry and Counseling ° °[facility]: Mobile Crisis   Management 24 hours:  1-877-626-1772 ° °[facility] sliding scale fee and walk in schedule: M-F 8am-12pm/1pm-3pm °[address]  °[place], [place] [address] °[phone] ° °[facility] °[address] °[place], [place] [address] °336-703-9650 ° °[facility] (Former[facility] known [facility])- new patient walk-in appointments available Monday - Friday 8am -3pm.          °[address] °[place], [place] [address] °336-676-6840 or crisis line- 336-676-6905 ° °[facility] Intensive Outpatient Therapy Program °[address] °[place], [place] [address] °336-832-9804 ° °[facility]                  °Crisis Services      °336.641.4993      °[address]     °[place], [place] [address]                ° °[facility]   °[facility] °[phone] °[address] °[place], [place] [address] ° ° °[facility]          °[address],  °[place], [place] [address]       °([phone] [phone] ° °[facility] °[address] °[place], [place] [address] °336-292-1510 ° °[facility]    °[address]    °[place], [place] [address]     °336-632-3505      ° °Parish McKinney, MD     °[address]     °[place] [address]     °336-282-1251     °  °[facility] °[address] °[place] [address] ° °[facility]     °[address]     °[place], [place]      °336-542-2076      ° °[facility] °Shamsher Ahluwalia, MD °[address] °[place], [place] [address] °336-420-9558 ° °[facility]     °[address]     °[place], [place] [address]     °336-274-1237      ° °[facility] °[address] °[place], [place] [address] °336-854-4450 °Resources for Temporary Residential Assistance/Crisis Centers ° °DAY CENTERS °[facility] °M-F 8am-3pm   °[address] [place], [place] [address]   336-332-0824 °Services include: laundry, barbering, support groups, case management, phone  & computer access, showers, AA/NA mtgs, mental health/substance abuse nurse, job skills class, disability information, VA assistance, spiritual classes, etc.  ° °HOMELESS SHELTERS ° °[facility]     °[facility]   °[address], [place]     °336.271.5959       °       °[facility] (women and children)       °[address] °[place], [place] [address] °336-275-0820 °[email or web address] for application and process °Application Required ° °[facility]   °[address]    °[place] [address]     °336.886.4922       °             °[facility] °[address] °[place], [place] [address] °336.273.5572 °336-235-0363(schedule application appt.) °Application Required ° °[facility] (women only)    °[address]     °[place],  [address]     °336-884-1039      °  Intake starts 6pm daily °Need valid ID, SSC, & Police report °[facility] [place] °[address] °[place], [place] °336-881-5420 °Application Required ° °[facility] (men only)     °[address]      °[place], [place]     °[phone]      ° °[facility] °(Pregnant women only) °[address] °[place], [place] °336-275-0206 ° °[facility]      °[address]      °[place], [place] [address]     °336-722-9951      °       °[facility] °[address] °[place], [place] °336-723-1848 °90 day commitment/SA/Application process ° °[facility](men only)     °[address]     °[place], [place]     °336-748-1962       °Check-in at 7pm     °       °[facility] °[address] °[place], [place] [address] °336-248-6684 °Men/Women/Women and Children must be there by 7 pm ° °[facility] °[place],  °336-722-8721                ° °

## 2016-11-02 NOTE — ED Notes (Signed)
Snack given. Lunch order taken. 

## 2017-01-04 ENCOUNTER — Encounter (HOSPITAL_COMMUNITY): Payer: Self-pay | Admitting: Emergency Medicine

## 2017-01-04 ENCOUNTER — Emergency Department (HOSPITAL_COMMUNITY)
Admission: EM | Admit: 2017-01-04 | Discharge: 2017-01-05 | Disposition: A | Discharge status: Home / Self Care | Payer: Medicaid Other | Attending: Emergency Medicine | Admitting: Emergency Medicine

## 2017-01-04 DIAGNOSIS — R45850 Homicidal ideations: Secondary | ICD-10-CM | POA: Diagnosis not present

## 2017-01-04 DIAGNOSIS — F29 Unspecified psychosis not due to a substance or known physiological condition: Secondary | ICD-10-CM | POA: Diagnosis not present

## 2017-01-04 DIAGNOSIS — R45851 Suicidal ideations: Secondary | ICD-10-CM | POA: Diagnosis present

## 2017-01-04 DIAGNOSIS — Z79899 Other long term (current) drug therapy: Secondary | ICD-10-CM | POA: Insufficient documentation

## 2017-01-04 DIAGNOSIS — F1420 Cocaine dependence, uncomplicated: Secondary | ICD-10-CM | POA: Diagnosis present

## 2017-01-04 DIAGNOSIS — F1494 Cocaine use, unspecified with cocaine-induced mood disorder: Secondary | ICD-10-CM | POA: Diagnosis present

## 2017-01-04 LAB — COMPREHENSIVE METABOLIC PANEL
ALBUMIN: 3.4 g/dL — AB (ref 3.5–5.0)
ALT: 18 U/L (ref 17–63)
AST: 24 U/L (ref 15–41)
Alkaline Phosphatase: 60 U/L (ref 38–126)
Anion gap: 7 (ref 5–15)
BUN: 13 mg/dL (ref 6–20)
CHLORIDE: 104 mmol/L (ref 101–111)
CO2: 27 mmol/L (ref 22–32)
Calcium: 8.8 mg/dL — ABNORMAL LOW (ref 8.9–10.3)
Creatinine, Ser: 0.97 mg/dL (ref 0.61–1.24)
GFR calc Af Amer: >60 mL/min (ref >60)
GLUCOSE: 121 mg/dL — AB (ref 65–99)
POTASSIUM: 3.5 mmol/L (ref 3.5–5.1)
SODIUM: 138 mmol/L (ref 135–145)
Total Bilirubin: 0.4 mg/dL (ref 0.3–1.2)
Total Protein: 7.6 g/dL (ref 6.5–8.1)

## 2017-01-04 LAB — ETHANOL

## 2017-01-04 LAB — ACETAMINOPHEN LEVEL: Acetaminophen (Tylenol), Serum: <10 ug/mL — ABNORMAL LOW (ref 10–30)

## 2017-01-04 LAB — CBC
HCT: 37.4 % — ABNORMAL LOW (ref 39.0–52.0)
HEMOGLOBIN: 12.2 g/dL — AB (ref 13.0–17.0)
MCH: 28.8 pg (ref 26.0–34.0)
MCHC: 32.6 g/dL (ref 30.0–36.0)
MCV: 88.4 fL (ref 78.0–100.0)
PLATELETS: 208 10*3/uL (ref 150–400)
RBC: 4.23 MIL/uL (ref 4.22–5.81)
RDW: 14.8 % (ref 11.5–15.5)
WBC: 6.9 10*3/uL (ref 4.0–10.5)

## 2017-01-04 LAB — RAPID URINE DRUG SCREEN, HOSP PERFORMED
AMPHETAMINES: NOT DETECTED
BENZODIAZEPINES: POSITIVE — AB
Barbiturates: NOT DETECTED
Cocaine: POSITIVE — AB
Opiates: NOT DETECTED
TETRAHYDROCANNABINOL: NOT DETECTED

## 2017-01-04 LAB — SALICYLATE LEVEL: Salicylate Lvl: <7 mg/dL (ref 2.8–30.0)

## 2017-01-04 MED ORDER — BENZTROPINE MESYLATE 0.5 MG PO TABS
0.5000 mg | ORAL_TABLET | Freq: Two times a day (BID) | ORAL | Status: DC
  Administered 2017-01-04 – 2017-01-05 (×2): 0.5 mg via ORAL
  Filled 2017-01-04 (×2): qty 1

## 2017-01-04 MED ORDER — ZOLPIDEM TARTRATE 5 MG PO TABS: 5.0000 mg | ORAL_TABLET | Freq: Every evening | ORAL | Status: DC | PRN

## 2017-01-04 MED ORDER — ONDANSETRON HCL 4 MG PO TABS: 4.0000 mg | ORAL_TABLET | Freq: Three times a day (TID) | ORAL | Status: DC | PRN

## 2017-01-04 MED ORDER — ACETAMINOPHEN 325 MG PO TABS
650.0000 mg | ORAL_TABLET | ORAL | Status: DC | PRN
  Administered 2017-01-04: 650 mg via ORAL
  Filled 2017-01-04: qty 2

## 2017-01-04 MED ORDER — HYDROXYZINE HCL 25 MG PO TABS: 25.0000 mg | ORAL_TABLET | Freq: Four times a day (QID) | ORAL | Status: DC | PRN

## 2017-01-04 MED ORDER — TRAZODONE HCL 50 MG PO TABS
150.0000 mg | ORAL_TABLET | Freq: Every evening | ORAL | Status: DC | PRN
  Filled 2017-01-04: qty 1

## 2017-01-04 MED ORDER — ZIPRASIDONE HCL 20 MG PO CAPS
40.0000 mg | ORAL_CAPSULE | Freq: Two times a day (BID) | ORAL | Status: DC
  Administered 2017-01-04 – 2017-01-05 (×2): 40 mg via ORAL
  Filled 2017-01-04 (×2): qty 2

## 2017-01-04 NOTE — ED Notes (Signed)
Bed: [facility]PT4 Expected date:  Expected time:  Means of arrival:  Comments: 
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
## 2017-01-04 NOTE — ED Notes (Signed)
Pt admitted to room #40. Pt behavior cooperative, pleasant on approach. Pt endorsing SI/HI/AVH. Pt verbally contracts for safety. Pt reports he was released from jail last night after spending 48 days for larceny. Pt reports while in jail he was off his medication. Encouragement and support provided. Special checks q 15 mins in place for safety, Video monitoring in place. Will continue to monitor.
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
## 2017-01-05 DIAGNOSIS — F1420 Cocaine dependence, uncomplicated: Secondary | ICD-10-CM | POA: Diagnosis present

## 2017-01-05 DIAGNOSIS — F1494 Cocaine use, unspecified with cocaine-induced mood disorder: Secondary | ICD-10-CM | POA: Diagnosis present

## 2017-01-05 MED ORDER — HYDROXYZINE HCL 25 MG PO TABS: 25.0000 mg | ORAL_TABLET | Freq: Four times a day (QID) | ORAL | 0 refills | Status: DC | PRN

## 2017-01-05 MED ORDER — TRAZODONE HCL 150 MG PO TABS: 150.0000 mg | ORAL_TABLET | Freq: Every evening | ORAL | 0 refills | Status: DC | PRN

## 2017-01-05 MED ORDER — BENZTROPINE MESYLATE 0.5 MG PO TABS: 0.5000 mg | ORAL_TABLET | Freq: Two times a day (BID) | ORAL | 0 refills | Status: DC

## 2017-01-05 MED ORDER — ZIPRASIDONE HCL 40 MG PO CAPS: 40.0000 mg | ORAL_CAPSULE | Freq: Two times a day (BID) | ORAL | 0 refills | Status: DC

## 2017-01-05 NOTE — BH Assessment (Signed)
[facility] Assessment Progress Note  Per Thedore MinsMojeed Akintayo, MD, this pt does not require psychiatric hospitalization at this time.  [facility] with [facility].  This has been included in pt's discharge instructions.  Pt's nurse, Morrie Sheldonshley, has been notified.  Doylene Canninghomas Ka Flammer, MA Triage Specialist [phone]
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
## 2017-01-05 NOTE — ED Notes (Addendum)
Pt d/c home per MD order. Discharge summary reviewed with pt. Pt verbalizes understanding of discharge summary. RX provided. [place] city bus pass provided and [employer] bus provided, per pt request. [place]. Pt voicing no concerns at this time and verbalizes understanding of outpatient resources. Pt signed for personal property and property returned. Pt signed e-signature. No s/s of distress noted.  Ambulatory off unit with [facility].

## 2017-01-05 NOTE — Progress Notes (Signed)
Patient currently lives in [place] and is unable to find ride home. CSW provided patient with [employer] Bus pass for transportation.   Stacy GardnerErin Jia Dottavio, LCSWA Clinical Social Worker [phone]

## 2017-01-05 NOTE — Discharge Instructions (Signed)
For your ongoing behavioral health needs you are advised to follow up with [facility].  New patients are seen at their walk-in clinic.  Walk-in hours are Monday - Friday from 8:00 am - 12:00 pm, and from 1:00 pm - 3:00 pm.  Walk-in patients are seen on a first come, first served basis, so try to arrive as early as possible for the best chance of being seen the same day.  There is an initial fee of $22.50: ° °     [facility] °     [address] °     [place], [place] [address] °     [phone] °

## 2017-01-05 NOTE — ED Notes (Signed)
Pt noted in bed sleeping majority of the shift. Pt compliant with morning medication regimen. Special checks q 15 mins in place for safety, Video monitoring in place. Will continue to monitor.

## 2017-01-08 ENCOUNTER — Emergency Department (HOSPITAL_COMMUNITY)
Admission: EM | Admit: 2017-01-08 | Discharge: 2017-01-11 | Disposition: A | Discharge status: Other Acute Inpatient Hospital | Payer: Medicaid Other | Attending: Emergency Medicine | Admitting: Emergency Medicine

## 2017-01-08 DIAGNOSIS — Z818 Family history of other mental and behavioral disorders: Secondary | ICD-10-CM | POA: Diagnosis not present

## 2017-01-08 DIAGNOSIS — R45851 Suicidal ideations: Secondary | ICD-10-CM | POA: Insufficient documentation

## 2017-01-08 DIAGNOSIS — Z79899 Other long term (current) drug therapy: Secondary | ICD-10-CM | POA: Diagnosis not present

## 2017-01-08 DIAGNOSIS — R440 Auditory hallucinations: Secondary | ICD-10-CM | POA: Diagnosis not present

## 2017-01-08 DIAGNOSIS — F1290 Cannabis use, unspecified, uncomplicated: Secondary | ICD-10-CM | POA: Diagnosis not present

## 2017-01-08 DIAGNOSIS — R45850 Homicidal ideations: Secondary | ICD-10-CM | POA: Insufficient documentation

## 2017-01-08 DIAGNOSIS — R443 Hallucinations, unspecified: Secondary | ICD-10-CM

## 2017-01-08 DIAGNOSIS — F1494 Cocaine use, unspecified with cocaine-induced mood disorder: Secondary | ICD-10-CM | POA: Diagnosis not present

## 2017-01-08 LAB — COMPREHENSIVE METABOLIC PANEL
ALT: 14 U/L — ABNORMAL LOW (ref 17–63)
ANION GAP: 6 (ref 5–15)
AST: 18 U/L (ref 15–41)
Albumin: 3.2 g/dL — ABNORMAL LOW (ref 3.5–5.0)
Alkaline Phosphatase: 60 U/L (ref 38–126)
BUN: 7 mg/dL (ref 6–20)
CALCIUM: 9 mg/dL (ref 8.9–10.3)
CHLORIDE: 105 mmol/L (ref 101–111)
CO2: 27 mmol/L (ref 22–32)
Creatinine, Ser: 0.85 mg/dL (ref 0.61–1.24)
GFR calc non Af Amer: >60 mL/min (ref >60)
Glucose, Bld: 85 mg/dL (ref 65–99)
Potassium: 3.8 mmol/L (ref 3.5–5.1)
SODIUM: 138 mmol/L (ref 135–145)
Total Bilirubin: 0.3 mg/dL (ref 0.3–1.2)
Total Protein: 7.2 g/dL (ref 6.5–8.1)

## 2017-01-08 LAB — CBC
HCT: 38.9 % — ABNORMAL LOW (ref 39.0–52.0)
Hemoglobin: 12.3 g/dL — ABNORMAL LOW (ref 13.0–17.0)
MCH: 28.3 pg (ref 26.0–34.0)
MCHC: 31.6 g/dL (ref 30.0–36.0)
MCV: 89.6 fL (ref 78.0–100.0)
Platelets: 214 10*3/uL (ref 150–400)
RBC: 4.34 MIL/uL (ref 4.22–5.81)
RDW: 14.7 % (ref 11.5–15.5)
WBC: 6.5 10*3/uL (ref 4.0–10.5)

## 2017-01-08 LAB — RAPID URINE DRUG SCREEN, HOSP PERFORMED
Amphetamines: NOT DETECTED
Barbiturates: NOT DETECTED
Benzodiazepines: NOT DETECTED
Cocaine: NOT DETECTED
OPIATES: NOT DETECTED
Tetrahydrocannabinol: NOT DETECTED

## 2017-01-08 LAB — ETHANOL: Alcohol, Ethyl (B): <5 mg/dL (ref <5)

## 2017-01-08 LAB — ACETAMINOPHEN LEVEL

## 2017-01-08 LAB — SALICYLATE LEVEL

## 2017-01-08 NOTE — ED Triage Notes (Signed)
States hearing voicies in his head to kill hiim self and others
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
## 2017-01-08 NOTE — ED Provider Notes (Signed)
[facility]-EMERGENCY DEPT Provider Note   CSN: [id number] Arrival date & time: 01/08/17  1347     History   Chief Complaint Chief Complaint  Patient presents with  . Hallucinations    HPI Bruce [name] is a 41 y.o. male.  The history is provided by the patient.  Mental Health Problem  Presenting symptoms: hallucinations, homicidal ideas and suicidal thoughts   Presenting symptoms: no aggressive behavior, no agitation, no bizarre behavior, no delusions, no depression, no paranoid behavior, no self-mutilation, no suicidal threats and no suicide attempt   Degree of incapacity (severity):  Moderate Onset quality:  Gradual Duration:  2 days Timing:  Constant Progression:  Unchanged Chronicity:  Recurrent Context: not alcohol use and not noncompliant   Treatment compliance:  All of the time Time since last psychoactive medication taken:  1 day Relieved by:  Nothing Worsened by:  Lack of sleep Associated symptoms: decreased need for sleep, insomnia, irritability and poor judgment   Associated symptoms: no abdominal pain and no chest pain   Risk factors: hx of mental illness (hx schizophrenia)     Past Medical History:  Diagnosis Date  . Depression   . Obesity   . Schizophrenia [facility](HCC)     Patient Active Problem List   Diagnosis Date Noted  . Cocaine use disorder, severe, dependence (HCC) 01/05/2017  . 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 Cocaine use disorder, mild, abuse 05/03/2016  . Cannabis use disorder, mild, abuse 05/03/2016  . Suicide ideation 04/30/2016  . Paranoid schizophrenia (HCC)   . Hyperprolactinemia (HCC) 09/24/2015  . Alcohol use disorder, moderate, dependence (HCC) 09/22/2015  . Morbid obesity (HCC) 09/22/2015    No past surgical history on file.     Home Medications    Prior to Admission medications   Medication Sig Start Date End Date Taking? Authorizing Provider  benztropine (COGENTIN) 0.5 MG tablet Take 1 tablet (0.5 mg total) by  mouth 2 (two) times daily with a meal. 10/15/16   Adonis BrookAgustin, Sheila, NP  benztropine (COGENTIN) 0.5 MG tablet Take 1 tablet (0.5 mg total) by mouth 2 (two) times daily with a meal. 01/05/17   Adonis BrookAgustin, Sheila, NP  hydrOXYzine (ATARAX/VISTARIL) 25 MG tablet Take 1 tablet (25 mg total) by mouth every 6 (six) hours as needed for anxiety. 10/15/16   Adonis BrookAgustin, Sheila, NP  hydrOXYzine (ATARAX/VISTARIL) 25 MG tablet Take 1 tablet (25 mg total) by mouth every 6 (six) hours as needed for anxiety. 01/05/17   Adonis BrookAgustin, Sheila, NP  traZODone (DESYREL) 150 MG tablet Take 1 tablet (150 mg total) by mouth at bedtime and may repeat dose one time if needed. 10/15/16   Adonis BrookAgustin, Sheila, NP  traZODone (DESYREL) 150 MG tablet Take 1 tablet (150 mg total) by mouth at bedtime and may repeat dose one time if needed. 01/05/17   Adonis BrookAgustin, Sheila, NP  ziprasidone (GEODON) 40 MG capsule Take 1 capsule (40 mg total) by mouth 2 (two) times daily with a meal. 10/15/16   Adonis BrookAgustin, Sheila, NP  ziprasidone (GEODON) 40 MG capsule Take 1 capsule (40 mg total) by mouth 2 (two) times daily with a meal. 01/05/17   Adonis BrookAgustin, Sheila, NP    Family History Family History  Problem Relation Age of Onset  . Schizophrenia Mother     Social History Social History  Substance Use Topics  . Smoking status: Never Smoker  . Smokeless tobacco: Never Used  . Alcohol use Yes     Allergies   Patient has no known allergies.  Review of Systems Review of Systems  Constitutional: Positive for irritability. Negative for chills and fever.  HENT: Negative for ear pain and sore throat.   Eyes: Negative for pain and visual disturbance.  Respiratory: Negative for cough and shortness of breath.   Cardiovascular: Negative for chest pain and palpitations.  Gastrointestinal: Negative for abdominal pain and vomiting.  Genitourinary: Negative for dysuria and hematuria.  Musculoskeletal: Negative for arthralgias and back pain.  Skin: Negative for color change and  rash.  Neurological: Negative for seizures and syncope.  Psychiatric/Behavioral: Positive for hallucinations, homicidal ideas and suicidal ideas. Negative for agitation, paranoia and self-injury. The patient has insomnia.   All other systems reviewed and are negative.    Physical Exam Updated Vital Signs BP 130/70   Pulse 82   Temp 98.5 F (36.9 C) (Oral)   Resp 16   SpO2 99%   Physical Exam  Constitutional: He is oriented to person, place, and time. He appears well-developed and well-nourished.  HENT:  Head: Normocephalic and atraumatic.  Eyes: Conjunctivae are normal. Pupils are equal, round, and reactive to light.  Neck: Normal range of motion. Neck supple.  Cardiovascular: Normal rate, regular rhythm, normal heart sounds and intact distal pulses.   No murmur heard. Pulmonary/Chest: Effort normal and breath sounds normal. No respiratory distress.  Abdominal: Soft. Bowel sounds are normal. There is no tenderness.  Musculoskeletal: Normal range of motion. He exhibits no edema.  Neurological: He is alert and oriented to person, place, and time.  Skin: Skin is warm and dry.  Psychiatric: He has a normal mood and affect. His speech is normal and behavior is normal. He is actively hallucinating (hearing voices telling him to kills himself and others ). Cognition and memory are normal. He expresses inappropriate judgment. He expresses homicidal and suicidal ideation. He is attentive.  Nursing note and vitals reviewed.    ED Treatments / Results  Labs (all labs ordered are listed, but only abnormal results are displayed) Labs Reviewed  COMPREHENSIVE METABOLIC PANEL - Abnormal; Notable for the following:       Result Value   Albumin 3.2 (*)    ALT 14 (*)    All other components within normal limits  ACETAMINOPHEN LEVEL - Abnormal; Notable for the following:    Acetaminophen (Tylenol), Serum <10 (*)    All other components within normal limits  CBC - Abnormal; Notable for the  following:    Hemoglobin 12.3 (*)    HCT 38.9 (*)    All other components within normal limits  ETHANOL  SALICYLATE LEVEL  RAPID URINE DRUG SCREEN, HOSP PERFORMED    EKG  EKG Interpretation None       Radiology No results found.  Procedures Procedures (including critical care time)  Medications Ordered in ED Medications - No data to display   Initial Impression / Assessment and Plan / ED Course  I have reviewed the triage vital signs and the nursing notes.  Pertinent labs & imaging results that were available during my care of the patient were reviewed by me and considered in my medical decision making (see chart for details).     Bruce [name] is a 41 year old male with history of schizophrenia who presents to the ED with homicidal and suicidal ideations and hallucinations. Patient's vitals at time of arrival to the ED are unremarkable and patient is without fever. Patient states that over the last 2 days he has been hearing voices and seeing dead bodies. Patient states that voices  are telling him to kill others and he states that he does have access to 9 mm gun and the thoughts are becoming more intrusive and he feels like he may act on that. Patient denies chest pain, headache, fever, neck stiffness. Patient states that he is compliant with his medications. Patient otherwise with no physical complaints. Patient has been not sleeping well. Labs ordered prior to my evaluation and were unremarkable including negative alcohol, Tylenol, salicylate level. Patient appears clinically sober. Exam is unremarkable. Psych evaluation ordered and pending. Patient medically cleared. Concern for decompensated schizophrenia.   Patient to be placed for psych reasons and transferred to pysch holding.   Final Clinical Impressions(s) / ED Diagnoses   Final diagnoses:  Hallucinations    New Prescriptions New Prescriptions   No medications on file     Virgina Norfolk, DO 01/08/17 2048     Mesner, Barbara Cower, MD 01/10/17 0001

## 2017-01-08 NOTE — ED Notes (Signed)
Pt given new yellow socks

## 2017-01-09 DIAGNOSIS — F1494 Cocaine use, unspecified with cocaine-induced mood disorder: Secondary | ICD-10-CM | POA: Diagnosis not present

## 2017-01-09 DIAGNOSIS — Z818 Family history of other mental and behavioral disorders: Secondary | ICD-10-CM | POA: Diagnosis not present

## 2017-01-09 DIAGNOSIS — R45851 Suicidal ideations: Secondary | ICD-10-CM

## 2017-01-09 DIAGNOSIS — R45850 Homicidal ideations: Secondary | ICD-10-CM

## 2017-01-09 DIAGNOSIS — F1290 Cannabis use, unspecified, uncomplicated: Secondary | ICD-10-CM

## 2017-01-09 MED ORDER — ONDANSETRON HCL 4 MG PO TABS: 4.0000 mg | ORAL_TABLET | Freq: Three times a day (TID) | ORAL | Status: DC | PRN

## 2017-01-09 MED ORDER — ZIPRASIDONE HCL 20 MG PO CAPS
40.0000 mg | ORAL_CAPSULE | Freq: Two times a day (BID) | ORAL | Status: DC
  Administered 2017-01-09 – 2017-01-10 (×3): 40 mg via ORAL
  Filled 2017-01-09 (×3): qty 2

## 2017-01-09 MED ORDER — LORAZEPAM 1 MG PO TABS: 1.0000 mg | ORAL_TABLET | Freq: Three times a day (TID) | ORAL | Status: DC | PRN

## 2017-01-09 MED ORDER — ACETAMINOPHEN 325 MG PO TABS: 650.0000 mg | ORAL_TABLET | ORAL | Status: DC | PRN

## 2017-01-09 MED ORDER — ALUM & MAG HYDROXIDE-SIMETH 200-200-20 MG/5ML PO SUSP: 30.0000 mL | ORAL | Status: DC | PRN

## 2017-01-09 MED ORDER — TRAZODONE HCL 50 MG PO TABS
150.0000 mg | ORAL_TABLET | Freq: Every evening | ORAL | Status: DC | PRN
  Administered 2017-01-09 – 2017-01-10 (×2): 150 mg via ORAL
  Filled 2017-01-09 (×2): qty 1

## 2017-01-09 MED ORDER — HYDROXYZINE HCL 25 MG PO TABS
25.0000 mg | ORAL_TABLET | Freq: Four times a day (QID) | ORAL | Status: DC | PRN
  Administered 2017-01-09: 25 mg via ORAL
  Filled 2017-01-09: qty 1

## 2017-01-09 MED ORDER — IBUPROFEN 400 MG PO TABS
600.0000 mg | ORAL_TABLET | Freq: Three times a day (TID) | ORAL | Status: DC | PRN
  Administered 2017-01-09: 600 mg via ORAL
  Filled 2017-01-09: qty 1

## 2017-01-09 NOTE — ED Notes (Signed)
Pt ambulatory to shower w/Sitter. 
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
## 2017-01-09 NOTE — ED Notes (Signed)
Larose HiresShaleeta, AC, [facility], aware requesting med rec for pt d/t pt reports not taking meds.

## 2017-01-09 NOTE — ED Notes (Addendum)
Pt reports hearing voices "real bad" to this RN and is asking for medications to help. This RN will discuss this with MD.

## 2017-01-09 NOTE — ED Notes (Signed)
Telepsych being performed. 

## 2017-01-09 NOTE — ED Notes (Signed)
Woke pt - received pt's dinner order request - pt then returned to sleeping.

## 2017-01-09 NOTE — Progress Notes (Signed)
Patient was recommended inpatient treatment and was referred to the following facilities that have bed availability today: [facility], [facility] [address], [facility].  At [facility]: [facility], [facility], [facility], [facility], [address], [facility], [facility], [address] [facility], [facility].  Patient has adult medicaid and could not be referred to [facility], [facility], and [facility].  CSW in disposition will continue to seek placement.  Bruce [name], LCSWA Disposition staff 01/09/2017 9:36 AM

## 2017-01-10 NOTE — BH Assessment (Signed)
[facility] Assessment Progress Note  At the request of Kateri PlummerKristin Cheshire, TS, this writer contacted the following facilities to seek placement for this patient, with results as indicated:  Beds available, information sent, decision pending:  [facility]   Declined:  [facility] (due to chronicity)   At capacity:  [facility]    Doylene Canninghomas Ion Gonnella, [place] Triage Specialist [phone]

## 2017-01-10 NOTE — [facility] Counselor (Signed)
Pt meets inpatient criteria and has been faxed to the following facilities with inpatient beds:   [facility], [facility], [facility], [facility], [facility] [address] [facility], [facility], [facility], [address] [place]   [facility], [facility]

## 2017-01-10 NOTE — ED Notes (Signed)
Spoke briefly with [place] [facility]. Requested further information for referral. Had [facility], but had been unable to reach. This RN did not recognize the phone number. Provided representative with [facility] for [facility].

## 2017-01-10 NOTE — BH Assessment (Signed)
The patient was calm and cooperative, had blunted affect and depressed mood. States his A/V became so bad last night he needed additional medication to try and calm down. Reports seeing dead people in his room, with gun shots to the head. States he remains SI with a plan to shoot himself. Reports his friend has a 9mm gun that he has access to anytime. The patient is HI, reports these thoughts get stronger as the voices get louder. States he takes an Abilify shot once a month. [facility] [place] He [facility] and at the [place] in [place] at the beginning of May. Reports its not safe to continue living where he is currently. States [facility] and hopes to get into [facility].

## 2017-01-10 NOTE — ED Triage Notes (Signed)
PT reports he  Hears voices that are telling him to kill  Others and kill himself

## 2017-01-10 NOTE — ED Triage Notes (Signed)
TTS today @ 1411

## 2017-01-10 NOTE — ED Notes (Signed)
Blood pressure recheck is better. [id number] pressure was recorded when patient was somnolent.

## 2017-01-11 NOTE — ED Notes (Signed)
Received additional call from [facility]. At this time Silva BandyKristi states that they have a bed which can be given to this patient, but she has still be [facility] to reach [facility]. Spoke with Bunnie Pionori, AC at [facility] and provided her with contact information for [facility]: Silva BandyKristi can be reached @ [phone]

## 2017-01-11 NOTE — ED Notes (Signed)
MD Wickline advised of need for EMTALA documentation.

## 2017-01-11 NOTE — Progress Notes (Signed)
[facility] has been [facility].  Patient assigned to room 901 Accepting physician is Dr. Dewaine CongerBarker.  Call report to [phone].  Bruce [name] K. Sherlon HandingHarris, LCAS-A, LPC-A, [facility]  Counselor 01/11/2017 12:43 AM

## 2017-01-11 NOTE — ED Notes (Signed)
Patient ambulated easily from department with [facility]. NAD.

## 2017-01-11 NOTE — ED Notes (Signed)
Informed patient of acceptance to [facility]. Patient agreeable to transfer at this time.

## 2017-01-11 NOTE — ED Provider Notes (Signed)
The patient appears reasonably stabilized for transfer considering the current resources, flow, and capabilities available in the ED at this time, and I doubt any other [facility] requiring further screening and/or treatment in the ED prior to transfer. Pt awake/alert, no distress, resting comfortably BP (!) 104/53 (BP Location: Right Arm)   Pulse 65   Temp 98.4 F (36.9 C) (Oral)   Resp 18   SpO2 100%     Zadie RhineWickline, Swara Donze, MD 01/11/17 0246

## 2017-01-11 NOTE — ED Notes (Signed)
Patient currently wearing only 2 hospital gowns because of body size. Provided patient with pair of sweatpants and tshirt from personal effects. All other belongings retained by care personnel until transfer.

## 2017-01-11 NOTE — ED Notes (Signed)
[facility] contacted for transportation 

## 2017-02-14 ENCOUNTER — Emergency Department (HOSPITAL_COMMUNITY)
Admission: EM | Admit: 2017-02-14 | Discharge: 2017-02-15 | Disposition: A | Discharge status: Other Acute Inpatient Hospital | Payer: Medicaid Other | Attending: Emergency Medicine | Admitting: Emergency Medicine

## 2017-02-14 ENCOUNTER — Emergency Department (HOSPITAL_COMMUNITY): Payer: Medicaid Other

## 2017-02-14 ENCOUNTER — Encounter (HOSPITAL_COMMUNITY): Payer: Self-pay

## 2017-02-14 DIAGNOSIS — Z79899 Other long term (current) drug therapy: Secondary | ICD-10-CM | POA: Insufficient documentation

## 2017-02-14 DIAGNOSIS — S99911A Unspecified injury of right ankle, initial encounter: Secondary | ICD-10-CM | POA: Diagnosis present

## 2017-02-14 DIAGNOSIS — F259 Schizoaffective disorder, unspecified: Secondary | ICD-10-CM | POA: Insufficient documentation

## 2017-02-14 DIAGNOSIS — R45851 Suicidal ideations: Secondary | ICD-10-CM | POA: Diagnosis not present

## 2017-02-14 DIAGNOSIS — Y929 Unspecified place or not applicable: Secondary | ICD-10-CM | POA: Insufficient documentation

## 2017-02-14 DIAGNOSIS — Y999 Unspecified external cause status: Secondary | ICD-10-CM | POA: Diagnosis not present

## 2017-02-14 DIAGNOSIS — W109XXA Fall (on) (from) unspecified stairs and steps, initial encounter: Secondary | ICD-10-CM | POA: Diagnosis not present

## 2017-02-14 DIAGNOSIS — Y939 Activity, unspecified: Secondary | ICD-10-CM | POA: Diagnosis not present

## 2017-02-14 DIAGNOSIS — R45850 Homicidal ideations: Secondary | ICD-10-CM | POA: Insufficient documentation

## 2017-02-14 LAB — RAPID URINE DRUG SCREEN, HOSP PERFORMED
Amphetamines: NOT DETECTED
Barbiturates: NOT DETECTED
Benzodiazepines: NOT DETECTED
Cocaine: NOT DETECTED
OPIATES: NOT DETECTED
TETRAHYDROCANNABINOL: NOT DETECTED

## 2017-02-14 LAB — CBC
HCT: 36 % — ABNORMAL LOW (ref 39.0–52.0)
HEMOGLOBIN: 11.2 g/dL — AB (ref 13.0–17.0)
MCH: 28 pg (ref 26.0–34.0)
MCHC: 31.1 g/dL (ref 30.0–36.0)
MCV: 90 fL (ref 78.0–100.0)
PLATELETS: 264 10*3/uL (ref 150–400)
RBC: 4 MIL/uL — ABNORMAL LOW (ref 4.22–5.81)
RDW: 14.2 % (ref 11.5–15.5)
WBC: 7.9 10*3/uL (ref 4.0–10.5)

## 2017-02-14 LAB — COMPREHENSIVE METABOLIC PANEL
ALK PHOS: 54 U/L (ref 38–126)
ALT: 18 U/L (ref 17–63)
AST: 19 U/L (ref 15–41)
Albumin: 3.4 g/dL — ABNORMAL LOW (ref 3.5–5.0)
Anion gap: 5 (ref 5–15)
BUN: 16 mg/dL (ref 6–20)
CALCIUM: 9.2 mg/dL (ref 8.9–10.3)
CO2: 30 mmol/L (ref 22–32)
CREATININE: 1 mg/dL (ref 0.61–1.24)
Chloride: 104 mmol/L (ref 101–111)
Glucose, Bld: 93 mg/dL (ref 65–99)
Potassium: 4.5 mmol/L (ref 3.5–5.1)
Sodium: 139 mmol/L (ref 135–145)
Total Bilirubin: 0.3 mg/dL (ref 0.3–1.2)
Total Protein: 7 g/dL (ref 6.5–8.1)

## 2017-02-14 LAB — ACETAMINOPHEN LEVEL: Acetaminophen (Tylenol), Serum: <10 ug/mL — ABNORMAL LOW (ref 10–30)

## 2017-02-14 LAB — SALICYLATE LEVEL

## 2017-02-14 LAB — ETHANOL

## 2017-02-14 MED ORDER — BENZTROPINE MESYLATE 1 MG PO TABS
0.5000 mg | ORAL_TABLET | Freq: Two times a day (BID) | ORAL | Status: DC
  Administered 2017-02-15: 0.5 mg via ORAL
  Filled 2017-02-14: qty 1

## 2017-02-14 MED ORDER — IBUPROFEN 400 MG PO TABS: 600.0000 mg | ORAL_TABLET | Freq: Three times a day (TID) | ORAL | Status: DC | PRN

## 2017-02-14 MED ORDER — ONDANSETRON HCL 4 MG PO TABS: 4.0000 mg | ORAL_TABLET | Freq: Three times a day (TID) | ORAL | Status: DC | PRN

## 2017-02-14 MED ORDER — HYDROXYZINE HCL 25 MG PO TABS: 25.0000 mg | ORAL_TABLET | Freq: Four times a day (QID) | ORAL | Status: DC | PRN

## 2017-02-14 MED ORDER — ACETAMINOPHEN 325 MG PO TABS: 650.0000 mg | ORAL_TABLET | ORAL | Status: DC | PRN

## 2017-02-14 MED ORDER — ZIPRASIDONE HCL 20 MG PO CAPS
40.0000 mg | ORAL_CAPSULE | Freq: Two times a day (BID) | ORAL | Status: DC
  Administered 2017-02-15: 40 mg via ORAL
  Filled 2017-02-14: qty 2

## 2017-02-14 MED ORDER — TRAZODONE HCL 50 MG PO TABS
150.0000 mg | ORAL_TABLET | Freq: Every evening | ORAL | Status: DC | PRN
  Administered 2017-02-14: 23:00:00 150 mg via ORAL
  Filled 2017-02-14: qty 1

## 2017-02-14 NOTE — ED Notes (Signed)
Patient [facility], Pt. Has 2 bags.

## 2017-02-14 NOTE — ED Notes (Signed)
Patient transported to x-ray. ?

## 2017-02-14 NOTE — ED Notes (Signed)
TTS machine at bedside and patient currently speaking to them.
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
## 2017-02-14 NOTE — Progress Notes (Signed)
Per Donell SievertSpencer, Simon NP meets inpatient criteria Elsie LincolnShean K. Sherlon HandingHarris, LCAS-A, LPC-A, NCC  Counselor 02/14/2017 9:00 PM

## 2017-02-14 NOTE — ED Notes (Signed)
Patient back from x-ray 

## 2017-02-14 NOTE — ED Triage Notes (Signed)
PT arrives to ED stating "voiced ar telling me to go on a big killing spree". Pt states he would plan to use his friends 9mm pistol to shoot people. PT also reports thoughts of wanting to harm himself. PT states he would jump off of bridge. HE endorses having visual hallucinations of people shadows on walls when no one is standing there.

## 2017-02-14 NOTE — ED Provider Notes (Signed)
[facility]-EMERGENCY DEPT Provider Note   CSN: [id number] Arrival date & time: 02/14/17  1442   By signing my name below, I, Clarisse GougeXavier Herndon, attest that this documentation has been prepared under the direction and in the presence of Verdie MosherLiu, Neysa Bonitoana Duo, MD. Electronically signed, Clarisse GougeXavier Herndon, ED Scribe. 02/14/17. 6:17 PM.   History   Chief Complaint Chief Complaint  Patient presents with  . Homicidal  . Suicidal   The history is provided by the patient and medical records. No language interpreter was used.    Bruce OrganDwight Matsuoka is a 41 y.o. male with h/o polysubstance abuse, paranoid schizophrenia and suicidal ideations presenting to the Emergency Department concerning SI/HI with plan x 2 weeks. Auditory and visual hallucinations reported. Pt stated in triage that he would kill himself by jumping from a bridge. He elaborates that he visualized an all red clad figure telling him that his auditory hallucinations would end if he went on a killing spree at an undisclosed local school. Pt reports that he would have gone on a killing spree with a friend's pistol had he not been interrupted by a call from his mother at the aforementioned friend's house. Pt hospitalized 12/2016 for psychiatric care; he states this was not helpful. No new medications; pt compliant with all prescribed medications at home. No ETOH or illicit drug use noted.  He reports a recent R ankle injury, stating he fell down stairs and twisted it ~1 week ago. He notes swelling and discoloration to the area. Numbness, weakness reported to the affected ankle. No wounds or active bleeding. No abdominal pain or N/V/D. No recent illnesses. No other complaints at this time.   Past Medical History:  Diagnosis Date  . Depression   . Obesity   . Schizophrenia [facility](HCC)     Patient Active Problem List   Diagnosis Date Noted  . Cocaine use disorder, severe, dependence (HCC) 01/05/2017  . 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 Cocaine use  disorder, mild, abuse 05/03/2016  . Cannabis use disorder, mild, abuse 05/03/2016  . Suicide ideation 04/30/2016  . Paranoid schizophrenia (HCC)   . Hyperprolactinemia (HCC) 09/24/2015  . Alcohol use disorder, moderate, dependence (HCC) 09/22/2015  . Morbid obesity (HCC) 09/22/2015    History reviewed. No pertinent surgical history.     Home Medications    Prior to Admission medications   Medication Sig Start Date End Date Taking? Authorizing Provider  acetaminophen (TYLENOL) 500 MG tablet Take 500-1,000 mg by mouth every 6 (six) hours as needed (for pain or headaches).    [provider]  ARIPiprazole (ABILIFY IM) Inject into the muscle every 30 (thirty) days.    [provider]  benztropine (COGENTIN) 0.5 MG tablet Take 1 tablet (0.5 mg total) by mouth 2 (two) times daily with a meal. 10/15/16   Adonis BrookAgustin, Sheila, NP  benztropine (COGENTIN) 0.5 MG tablet Take 1 tablet (0.5 mg total) by mouth 2 (two) times daily with a meal. Patient not taking: Reported on 01/08/2017 01/05/17   Adonis BrookAgustin, Sheila, NP  hydrOXYzine (ATARAX/VISTARIL) 25 MG tablet Take 1 tablet (25 mg total) by mouth every 6 (six) hours as needed for anxiety. Patient not taking: Reported on 01/08/2017 10/15/16   Adonis BrookAgustin, Sheila, NP  hydrOXYzine (ATARAX/VISTARIL) 25 MG tablet Take 1 tablet (25 mg total) by mouth every 6 (six) hours as needed for anxiety. Patient not taking: Reported on 01/08/2017 01/05/17   Adonis BrookAgustin, Sheila, NP  traZODone (DESYREL) 150 MG tablet Take 1 tablet (150 mg total) by mouth  at bedtime and may repeat dose one time if needed. Patient not taking: Reported on 01/08/2017 10/15/16   Adonis Brook, NP  traZODone (DESYREL) 150 MG tablet Take 1 tablet (150 mg total) by mouth at bedtime and may repeat dose one time if needed. Patient not taking: Reported on 01/08/2017 01/05/17   Adonis Brook, NP  ziprasidone (GEODON) 40 MG capsule Take 1 capsule (40 mg total) by mouth 2 (two) times daily with a  meal. Patient not taking: Reported on 01/08/2017 10/15/16   Adonis Brook, NP  ziprasidone (GEODON) 40 MG capsule Take 1 capsule (40 mg total) by mouth 2 (two) times daily with a meal. Patient not taking: Reported on 01/08/2017 01/05/17   Adonis Brook, NP    Family History Family History  Problem Relation Age of Onset  . Schizophrenia Mother     Social History Social History  Substance Use Topics  . Smoking status: Never Smoker  . Smokeless tobacco: Never Used  . Alcohol use No     Allergies   Patient has no known allergies.   Review of Systems Review of Systems  Gastrointestinal: Negative for abdominal pain, diarrhea, nausea and vomiting.  Musculoskeletal: Positive for arthralgias, gait problem and joint swelling.  Skin: Positive for color change. Negative for wound.  Psychiatric/Behavioral: Positive for suicidal ideas.  All other systems reviewed and are negative.    Physical Exam Updated Vital Signs There were no vitals taken for this visit.  Physical Exam Physical Exam  Nursing note and vitals reviewed. Constitutional: Well developed, well nourished, non-toxic, and in no acute distress Head: Normocephalic and atraumatic.  Mouth/Throat: Oropharynx is clear and moist.  Neck: Normal range of motion. Neck supple.  Cardiovascular: Normal rate and regular rhythm.   Pulmonary/Chest: Effort normal and breath sounds normal.  Abdominal: Soft. There is no tenderness. There is no rebound and no guarding.  Musculoskeletal: Normal range of motion. Soft tissue swelling around R ankle, FROM intact.  Neurological: Alert, no facial droop, fluent speech, moves all extremities symmetrically Skin: Skin is warm and dry.  Psychiatric: Cooperative   ED Treatments / Results  DIAGNOSTIC STUDIES: Oxygen Saturation is 97% on room air, normal by my interpretation.    COORDINATION OF CARE: 6:05 PM-Discussed next steps with pt. Pt verbalized understanding and is agreeable with the  plan. Will order imaging. Pt prepared for behavioral health evaluation.    Labs (all labs ordered are listed, but only abnormal results are displayed) Labs Reviewed  COMPREHENSIVE METABOLIC PANEL - Abnormal; Notable for the following:       Result Value   Albumin 3.4 (*)    All other components within normal limits  ACETAMINOPHEN LEVEL - Abnormal; Notable for the following:    Acetaminophen (Tylenol), Serum <10 (*)    All other components within normal limits  CBC - Abnormal; Notable for the following:    RBC 4.00 (*)    Hemoglobin 11.2 (*)    HCT 36.0 (*)    All other components within normal limits  ETHANOL  SALICYLATE LEVEL  RAPID URINE DRUG SCREEN, HOSP PERFORMED    EKG  EKG Interpretation None       Radiology No results found.  Procedures Procedures (including critical care time)  Medications Ordered in ED Medications  acetaminophen (TYLENOL) tablet 650 mg (not administered)  ibuprofen (ADVIL,MOTRIN) tablet 600 mg (not administered)  ondansetron (ZOFRAN) tablet 4 mg (not administered)     Initial Impression / Assessment and Plan / ED Course  I have  reviewed the triage vital signs and the nursing notes.  Pertinent labs & imaging results that were available during my care of the patient were reviewed by me and considered in my medical decision making (see chart for details).     41 year old male with history of paranoid schizophrenia and polysubstance abuse who presents with auditory and visual hallucinations, telling him to hurt other people. He is otherwise well-appearing and in no acute distress. Recent fall with right ankle swelling and pain, but with full range of motion and suspect sprain. I will obtain x-ray of the ankle. Blood work overall reassuring. He is felt to be medically cleared for TTS consult.  Final Clinical Impressions(s) / ED Diagnoses   Final diagnoses:  None    New Prescriptions New Prescriptions   No medications on file   I  personally performed the services described in this documentation, which was scribed in my presence. The recorded information has been reviewed and is accurate.    Lavera Guise, MD 02/14/17 Norberta Keens

## 2017-02-14 NOTE — ED Notes (Signed)
Dr. Liu at bedside 

## 2017-02-14 NOTE — ED Notes (Signed)
Chaplain has been notified that the pt would like to talk to a chaplain.

## 2017-02-15 NOTE — ED Notes (Signed)
PT ambulated to the bathroom with a steady gait

## 2017-02-15 NOTE — ED Notes (Signed)
TTS eval being done at bedside 

## 2017-02-15 NOTE — Progress Notes (Signed)
Patient meets criteria for inpatient treatment. CSW faxed referrals to the following inpatient facilities for review:   [facility], [facility], [facility], [facility], [facility], [facility], [facility], [facility], [address], [facility], [facility], [facility], [facility] [place]   TTS will continue to seek bed placement.

## 2017-02-15 NOTE — Progress Notes (Signed)
Responded to Chaplain referral and  consult to provide support to patient who had requested a visit. Spoke with patient's nurse and she indicated that patient is sleeping and we agreed that it would be better to let patient rest. If patient still want visit upon wakening Chaplain is available as needed. Venida Jarvis[place], Jolonda Gomm, [facility], [facility], Pager [phone]

## 2017-02-15 NOTE — ED Notes (Signed)
[facility] called for transport. 

## 2017-02-15 NOTE — Progress Notes (Signed)
[facility]. Accepting is Dr. Catha GosselinMikhail.  Number for report is [phone].  [place], RN notified.   Bruce DaubJolan Mohd [name] MSW, LCSWA CSW Disposition [phone]

## 2017-02-15 NOTE — ED Notes (Signed)
Notified PT [facility]. Accepting physician- Dr. Catha GosselinMikhail  Report #- [phone]
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
## 2017-02-15 NOTE — ED Notes (Signed)
Regular Diet was ordered for Lunch. 

## 2017-02-26 ENCOUNTER — Emergency Department (HOSPITAL_COMMUNITY)
Admission: EM | Admit: 2017-02-26 | Discharge: 2017-02-28 | Disposition: A | Discharge status: Home / Self Care | Payer: Medicaid Other | Attending: Emergency Medicine | Admitting: Emergency Medicine

## 2017-02-26 DIAGNOSIS — R45851 Suicidal ideations: Secondary | ICD-10-CM | POA: Diagnosis present

## 2017-02-26 DIAGNOSIS — F14959 Cocaine use, unspecified with cocaine-induced psychotic disorder, unspecified: Secondary | ICD-10-CM

## 2017-02-26 DIAGNOSIS — R441 Visual hallucinations: Secondary | ICD-10-CM | POA: Insufficient documentation

## 2017-02-26 DIAGNOSIS — R45850 Homicidal ideations: Secondary | ICD-10-CM

## 2017-02-26 DIAGNOSIS — F29 Unspecified psychosis not due to a substance or known physiological condition: Secondary | ICD-10-CM

## 2017-02-26 DIAGNOSIS — F1410 Cocaine abuse, uncomplicated: Secondary | ICD-10-CM | POA: Diagnosis not present

## 2017-02-26 DIAGNOSIS — F1910 Other psychoactive substance abuse, uncomplicated: Secondary | ICD-10-CM | POA: Diagnosis not present

## 2017-02-26 DIAGNOSIS — F1994 Other psychoactive substance use, unspecified with psychoactive substance-induced mood disorder: Secondary | ICD-10-CM | POA: Diagnosis not present

## 2017-02-26 DIAGNOSIS — R440 Auditory hallucinations: Secondary | ICD-10-CM | POA: Insufficient documentation

## 2017-02-26 DIAGNOSIS — F1420 Cocaine dependence, uncomplicated: Secondary | ICD-10-CM

## 2017-02-26 DIAGNOSIS — F1290 Cannabis use, unspecified, uncomplicated: Secondary | ICD-10-CM | POA: Diagnosis not present

## 2017-02-26 DIAGNOSIS — Z818 Family history of other mental and behavioral disorders: Secondary | ICD-10-CM | POA: Diagnosis not present

## 2017-02-26 LAB — RAPID URINE DRUG SCREEN, HOSP PERFORMED
Amphetamines: NOT DETECTED
BARBITURATES: NOT DETECTED
Benzodiazepines: NOT DETECTED
Cocaine: POSITIVE — AB
Opiates: NOT DETECTED
Tetrahydrocannabinol: POSITIVE — AB

## 2017-02-26 LAB — COMPREHENSIVE METABOLIC PANEL
ALBUMIN: 3.5 g/dL (ref 3.5–5.0)
ALK PHOS: 62 U/L (ref 38–126)
ALT: 38 U/L (ref 17–63)
ANION GAP: 7 (ref 5–15)
AST: 37 U/L (ref 15–41)
BILIRUBIN TOTAL: 0.3 mg/dL (ref 0.3–1.2)
BUN: 17 mg/dL (ref 6–20)
CALCIUM: 8.7 mg/dL — AB (ref 8.9–10.3)
CO2: 26 mmol/L (ref 22–32)
Chloride: 106 mmol/L (ref 101–111)
Creatinine, Ser: 0.95 mg/dL (ref 0.61–1.24)
GFR calc Af Amer: >60 mL/min (ref >60)
GFR calc non Af Amer: >60 mL/min (ref >60)
GLUCOSE: 93 mg/dL (ref 65–99)
Potassium: 3.9 mmol/L (ref 3.5–5.1)
Sodium: 139 mmol/L (ref 135–145)
TOTAL PROTEIN: 7.5 g/dL (ref 6.5–8.1)

## 2017-02-26 LAB — CBC
HEMATOCRIT: 34 % — AB (ref 39.0–52.0)
Hemoglobin: 11 g/dL — ABNORMAL LOW (ref 13.0–17.0)
MCH: 28.5 pg (ref 26.0–34.0)
MCHC: 32.4 g/dL (ref 30.0–36.0)
MCV: 88.1 fL (ref 78.0–100.0)
Platelets: 240 10*3/uL (ref 150–400)
RBC: 3.86 MIL/uL — ABNORMAL LOW (ref 4.22–5.81)
RDW: 14.8 % (ref 11.5–15.5)
WBC: 7 10*3/uL (ref 4.0–10.5)

## 2017-02-26 LAB — SALICYLATE LEVEL: Salicylate Lvl: <7 mg/dL (ref 2.8–30.0)

## 2017-02-26 LAB — ACETAMINOPHEN LEVEL: Acetaminophen (Tylenol), Serum: <10 ug/mL — ABNORMAL LOW (ref 10–30)

## 2017-02-26 LAB — ETHANOL: Alcohol, Ethyl (B): <5 mg/dL (ref <5)

## 2017-02-26 MED ORDER — BENZTROPINE MESYLATE 0.5 MG PO TABS
0.5000 mg | ORAL_TABLET | Freq: Two times a day (BID) | ORAL | Status: DC
  Administered 2017-02-26 – 2017-02-28 (×3): 0.5 mg via ORAL
  Filled 2017-02-26 (×2): qty 1

## 2017-02-26 MED ORDER — HYDROXYZINE HCL 25 MG PO TABS: 25.0000 mg | ORAL_TABLET | Freq: Four times a day (QID) | ORAL | Status: DC | PRN

## 2017-02-26 MED ORDER — TRAZODONE HCL 50 MG PO TABS
150.0000 mg | ORAL_TABLET | Freq: Every evening | ORAL | Status: DC | PRN
  Administered 2017-02-26: 23:00:00 150 mg via ORAL
  Filled 2017-02-26: qty 1

## 2017-02-26 MED ORDER — BENZTROPINE MESYLATE 0.5 MG PO TABS
0.5000 mg | ORAL_TABLET | Freq: Two times a day (BID) | ORAL | Status: DC
  Administered 2017-02-27 (×2): 0.5 mg via ORAL
  Filled 2017-02-26 (×4): qty 1

## 2017-02-26 MED ORDER — ONDANSETRON HCL 4 MG PO TABS: 4.0000 mg | ORAL_TABLET | Freq: Three times a day (TID) | ORAL | Status: DC | PRN

## 2017-02-26 MED ORDER — ZIPRASIDONE HCL 20 MG PO CAPS
40.0000 mg | ORAL_CAPSULE | Freq: Two times a day (BID) | ORAL | Status: DC
  Administered 2017-02-26 – 2017-02-28 (×5): 40 mg via ORAL
  Filled 2017-02-26 (×5): qty 2

## 2017-02-26 MED ORDER — ACETAMINOPHEN 325 MG PO TABS
650.0000 mg | ORAL_TABLET | ORAL | Status: DC | PRN
  Administered 2017-02-27: 650 mg via ORAL
  Filled 2017-02-26: qty 2

## 2017-02-26 NOTE — ED Notes (Signed)
Pt stated HI, SI, hallucinations, and oral ingestion of crack cocaine and unknown pills.
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
## 2017-02-26 NOTE — ED Notes (Signed)
Patient has been dressed out and wanded

## 2017-02-26 NOTE — ED Notes (Signed)
Hourly rounding reveals patient sleeping in room. No complaints, stable, in no acute distress. Q15 minute rounds and monitoring via Security Cameras to continue. 

## 2017-02-26 NOTE — ED Notes (Signed)
Hourly rounding reveals patient in room. No complaints, stable, in no acute distress. Q15 minute rounds and monitoring via Security Cameras to continue. 

## 2017-02-26 NOTE — ED Notes (Signed)
Pt oriented to room and unit.  Pt is pleasant and cooperative.  He does not appear to be  Agitated or anxious in any way.  He contracts for safety.  15 minute checks and video monitoring in place.

## 2017-02-26 NOTE — BH Assessment (Signed)
BHH Assessment Progress Note Case was staffed with Rene KocherEksir MD who recommended patient be re-evaluated in the a.m.

## 2017-02-26 NOTE — ED Notes (Signed)
Report to include situation, background, assessment and recommendations from Edie RN. Patient sleeping, respirations regular and unlabored. Q15 minute rounds and security camera observation to continue.   

## 2017-02-26 NOTE — ED Notes (Signed)
Bed: [facility] Expected date:  Expected time:  Means of arrival:  Comments: 

## 2017-02-26 NOTE — ED Triage Notes (Addendum)
Per EMS- Pt c/o SI and HI, hx of schizophrenia. Pt is feeling homicidal towards uncle. Pt is having hallucinations. Took 4-5 pills from bottle at 0100, uncle found bottle of tylenol ,one episode of diarrhea and vomiting.
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
## 2017-02-27 ENCOUNTER — Encounter (HOSPITAL_COMMUNITY): Payer: Self-pay | Admitting: Registered Nurse

## 2017-02-27 DIAGNOSIS — F1420 Cocaine dependence, uncomplicated: Secondary | ICD-10-CM

## 2017-02-27 DIAGNOSIS — F1994 Other psychoactive substance use, unspecified with psychoactive substance-induced mood disorder: Secondary | ICD-10-CM

## 2017-02-27 DIAGNOSIS — F39 Unspecified mood [affective] disorder: Secondary | ICD-10-CM

## 2017-02-27 DIAGNOSIS — F1910 Other psychoactive substance abuse, uncomplicated: Secondary | ICD-10-CM

## 2017-02-27 DIAGNOSIS — G4700 Insomnia, unspecified: Secondary | ICD-10-CM | POA: Diagnosis not present

## 2017-02-27 DIAGNOSIS — R45851 Suicidal ideations: Secondary | ICD-10-CM | POA: Diagnosis not present

## 2017-02-27 DIAGNOSIS — F1290 Cannabis use, unspecified, uncomplicated: Secondary | ICD-10-CM

## 2017-02-27 DIAGNOSIS — R440 Auditory hallucinations: Secondary | ICD-10-CM

## 2017-02-27 MED ORDER — ACETAMINOPHEN 325 MG PO TABS: 650.0000 mg | ORAL_TABLET | Freq: Four times a day (QID) | ORAL | Status: DC | PRN

## 2017-02-27 NOTE — ED Notes (Signed)
Hourly rounding reveals patient sleeping in room. No complaints, stable, in no acute distress. Q15 minute rounds and monitoring via Security Cameras to continue. 

## 2017-02-27 NOTE — ED Notes (Signed)
Report to include situation, background, assessment and recommendations from Latrisha RN. Patient sleeping, respirations regular and unlabored. Q15 minute rounds and security camera observation to continue.   

## 2017-02-27 NOTE — ED Notes (Signed)
Pt. Up to restroom. 

## 2017-02-27 NOTE — BH Assessment (Signed)
B[facility] Assessment Progress Note Pt's information faxed to [facility], [facility], [facility], [facility], [facility], [facility] and [facility]        

## 2017-02-27 NOTE — Consult Note (Signed)
[facility] Consult   Reason for Consult:  Psychosis Referring Physician:  EDP Patient Identification: Bruce [name] MRN:  [id number] Principal Diagnosis: Substance induced mood disorder ([place]) Diagnosis:   Patient Active Problem List   Diagnosis Date Noted  . Polysubstance abuse [F19.10] 02/27/2017  . Auditory hallucinations [R44.0] 02/27/2017  . Substance induced mood disorder ([place]) [F19.94] 02/27/2017  . Cocaine use disorder, severe, dependence ([place]) [F14.20] 01/05/2017  . Cocaine-induced mood disorder ([place]) [F14.94] 01/05/2017  . Cocaine use disorder, mild, abuse [F14.10] 05/03/2016  . Cannabis use disorder, mild, abuse [F12.10] 05/03/2016  . Suicide ideation [R45.851] 04/30/2016  . Paranoid schizophrenia ([place]) [F20.0]   . Hyperprolactinemia ([place]) [E22.1] 09/24/2015  . Alcohol use disorder, moderate, dependence ([place]) [F10.20] 09/22/2015  . Morbid obesity ([place]) [E66.01] 09/22/2015    Total Time spent with patient: 45 minutes  Subjective:   Bruce [name] is a 41 y.o. male patient presents to [facility] with complaints auditory hallucinations and  .  HPI:  41 y.o., male presented to [facility] voluntary with complaints of polysubstance abuse and wanting assistance to get off of drugs.  States that he has worsening of depression, hearing voices "Telling me to hurt my self and other' also suicidal thoughts.  Today patient is alert/oriented, calm/cooperative.  Medications will be started for patient stabilized.    Past Psychiatric History: Polysubstance abuse dependence (cocaine/THC);  Prior inpatient/outpatient psychiatric treatment  Risk to Self: Suicidal Ideation: Yes-Currently Present Suicidal Intent: Yes-Currently Present Is patient at risk for suicide?: Yes Suicidal Plan?: Yes-Currently Present Specify Current Suicidal Plan: Pt ate a large amount of crack cocaine Access to Means: Yes Specify Access to Suicidal Means: Pt had drugs What has been your use of drugs/alcohol  within the last 12 months?: Current use How many times?:  (Multiple) Other Self Harm Risks: NA Triggers for Past Attempts: Unknown Intentional Self Injurious Behavior: None Comment - Self Injurious Behavior: NA Risk to Others: Homicidal Ideation: No Thoughts of Harm to Others: No Comment - Thoughts of Harm to Others: NA Current Homicidal Intent: No Current Homicidal Plan: No Describe Current Homicidal Plan: NA Access to Homicidal Means: No Describe Access to Homicidal Means: NA Identified Victim: NA History of harm to others?: No Assessment of Violence: None Noted Violent Behavior Description: NA Does patient have access to weapons?: No Criminal Charges Pending?: No Does patient have a court date: No Prior Inpatient Therapy: Prior Inpatient Therapy: Yes Prior Therapy Dates: 2018 Prior Therapy Facilty/Provider(s): [facility] Reason for Treatment: MH issues Prior Outpatient Therapy: Prior Outpatient Therapy: Yes Prior Therapy Dates: 2018 Prior Therapy Facilty/Provider(s): [facility] Reason for Treatment: MH issues Does patient have an ACCT team?: No Does patient have Intensive In-House Services?  : No Does patient have [facility] services? : Yes Does patient have P4CC services?: No  Past Medical History:  Past Medical History:  Diagnosis Date  . Depression   . Obesity   . Schizophrenia ([place])    History reviewed. No pertinent surgical history. Family History:  Family History  Problem Relation Age of Onset  . Schizophrenia Mother    Family Psychiatric  History: See Fm history listed above Social History:  History  Alcohol Use No     History  Drug Use  . Types: Marijuana, "Crack" cocaine, Cocaine    Comment: "in the past"    Social History   Social History  . Marital status: Single    Spouse name: N/A  . Number of children: N/A  . Years of education: N/A   Social  History Main Topics  . Smoking status: Never Smoker  . Smokeless tobacco: Never Used  . Alcohol use No  .  Drug use: Yes    Types: Marijuana, "Crack" cocaine, Cocaine     Comment: "in the past"  . Sexual activity: Not Currently   Other Topics Concern  . None   Social History Narrative  . None   Additional Social History:    Allergies:  No Known Allergies  Labs:  Results for orders placed or performed during the hospital encounter of 02/26/17 (from the past 48 hour(s))  Comprehensive metabolic panel     Status: Abnormal   Collection Time: 02/26/17  5:22 AM  Result Value Ref Range   Sodium 139 135 - 145 mmol/L   Potassium 3.9 3.5 - 5.1 mmol/L   Chloride 106 101 - 111 mmol/L   CO2 26 22 - 32 mmol/L   Glucose, Bld 93 65 - 99 mg/dL   BUN 17 6 - 20 mg/dL   Creatinine, Ser 0.95 0.61 - 1.24 mg/dL   Calcium 8.7 (L) 8.9 - 10.3 mg/dL   Total Protein 7.5 6.5 - 8.1 g/dL   Albumin 3.5 3.5 - 5.0 g/dL   AST 37 15 - 41 U/L   ALT 38 17 - 63 U/L   Alkaline Phosphatase 62 38 - 126 U/L   Total Bilirubin 0.3 0.3 - 1.2 mg/dL   GFR calc non Af Amer >60 >60 mL/min   GFR calc Af Amer >60 >60 mL/min    Comment: (NOTE) The eGFR has been calculated using the CKD EPI equation. This calculation has not been validated in all clinical situations. eGFR's persistently <60 mL/min signify possible Chronic Kidney Disease.    Anion gap 7 5 - 15  Ethanol     Status: None   Collection Time: 02/26/17  5:22 AM  Result Value Ref Range   Alcohol, Ethyl (B) <5 <5 mg/dL    Comment:        LOWEST DETECTABLE LIMIT FOR SERUM ALCOHOL IS 5 mg/dL FOR MEDICAL PURPOSES ONLY   Salicylate level     Status: None   Collection Time: 02/26/17  5:22 AM  Result Value Ref Range   Salicylate Lvl <4.1 2.8 - 30.0 mg/dL  Acetaminophen level     Status: Abnormal   Collection Time: 02/26/17  5:22 AM  Result Value Ref Range   Acetaminophen (Tylenol), Serum <10 (L) 10 - 30 ug/mL    Comment:        THERAPEUTIC CONCENTRATIONS VARY SIGNIFICANTLY. A RANGE OF 10-30 ug/mL MAY BE AN EFFECTIVE CONCENTRATION FOR MANY PATIENTS. HOWEVER,  SOME ARE BEST TREATED AT CONCENTRATIONS OUTSIDE THIS RANGE. ACETAMINOPHEN CONCENTRATIONS >150 ug/mL AT 4 HOURS AFTER INGESTION AND >50 ug/mL AT 12 HOURS AFTER INGESTION ARE OFTEN ASSOCIATED WITH TOXIC REACTIONS.   cbc     Status: Abnormal   Collection Time: 02/26/17  5:22 AM  Result Value Ref Range   WBC 7.0 4.0 - 10.5 K/uL   RBC 3.86 (L) 4.22 - 5.81 MIL/uL   Hemoglobin 11.0 (L) 13.0 - 17.0 g/dL   HCT 34.0 (L) 39.0 - 52.0 %   MCV 88.1 78.0 - 100.0 fL   MCH 28.5 26.0 - 34.0 pg   MCHC 32.4 30.0 - 36.0 g/dL   RDW 14.8 11.5 - 15.5 %   Platelets 240 150 - 400 K/uL  Rapid urine drug screen (hospital performed)     Status: Abnormal   Collection Time: 02/26/17  5:30 AM  Result  Value Ref Range   Opiates NONE DETECTED NONE DETECTED   Cocaine POSITIVE (A) NONE DETECTED   Benzodiazepines NONE DETECTED NONE DETECTED   Amphetamines NONE DETECTED NONE DETECTED   Tetrahydrocannabinol POSITIVE (A) NONE DETECTED   Barbiturates NONE DETECTED NONE DETECTED    Comment:        DRUG SCREEN FOR MEDICAL PURPOSES ONLY.  IF CONFIRMATION IS NEEDED FOR ANY PURPOSE, NOTIFY LAB WITHIN 5 DAYS.        LOWEST DETECTABLE LIMITS FOR URINE DRUG SCREEN Drug Class       Cutoff (ng/mL) Amphetamine      1000 Barbiturate      200 Benzodiazepine   536 Tricyclics       144 Opiates          300 Cocaine          300 THC              50     Current Facility-Administered Medications  Medication Dose Route Frequency Provider Last Rate Last Dose  . acetaminophen (TYLENOL) tablet 650 mg  650 mg Oral Q4H PRN Kathrynn Humble, Ankit, MD      . acetaminophen (TYLENOL) tablet 650 mg  650 mg Oral Q6H PRN Aundra Dubin, MD      . benztropine (COGENTIN) tablet 0.5 mg  0.5 mg Oral BID WC Nanavati, Ankit, MD   0.5 mg at 02/27/17 0826  . benztropine (COGENTIN) tablet 0.5 mg  0.5 mg Oral BID WC Nanavati, Ankit, MD   0.5 mg at 02/26/17 1707  . hydrOXYzine (ATARAX/VISTARIL) tablet 25 mg  25 mg Oral Q6H PRN Nanavati, Ankit,  MD      . hydrOXYzine (ATARAX/VISTARIL) tablet 25 mg  25 mg Oral Q6H PRN Nanavati, Ankit, MD      . ondansetron (ZOFRAN) tablet 4 mg  4 mg Oral Q8H PRN Nanavati, Ankit, MD      . traZODone (DESYREL) tablet 150 mg  150 mg Oral QHS,MR X 1 Nanavati, Ankit, MD   Stopped at 02/26/17 2315  . ziprasidone (GEODON) capsule 40 mg  40 mg Oral BID WC Kathrynn Humble, Ankit, MD   40 mg at 02/27/17 0825   Current Outpatient Prescriptions  Medication Sig Dispense Refill  . acetaminophen (TYLENOL) 500 MG tablet Take 500-1,000 mg by mouth every 6 (six) hours as needed for moderate pain (for pain or headaches).     Marland Kitchen amLODipine (NORVASC) 5 MG tablet Take 5 mg by mouth daily.     . Menthol-Methyl Salicylate (MUSCLE RUB) 10-15 % CREA Apply 1 application topically 3 (three) times daily as needed for muscle pain.    . Paliperidone Palmitate (INVEGA SUSTENNA IM) Inject 150 mg into the muscle every 30 (thirty) days. Pt [facility] beginning [facility] June [facility].    . traZODone (DESYREL) 150 MG tablet Take 1 tablet (150 mg total) by mouth at bedtime and may repeat dose one time if needed. (Patient taking differently: Take 150 mg by mouth at bedtime and may repeat dose one time if needed. Sleep) 30 tablet 0  . benztropine (COGENTIN) 0.5 MG tablet Take 1 tablet (0.5 mg total) by mouth 2 (two) times daily with a meal. (Patient not taking: Reported on 02/26/2017) 60 tablet 0  . benztropine (COGENTIN) 0.5 MG tablet Take 1 tablet (0.5 mg total) by mouth 2 (two) times daily with a meal. (Patient not taking: Reported on 02/14/2017) 60 tablet 0  . hydrOXYzine (ATARAX/VISTARIL) 25 MG tablet Take 1 tablet (25  mg total) by mouth every 6 (six) hours as needed for anxiety. (Patient not taking: Reported on 01/08/2017) 30 tablet 0  . hydrOXYzine (ATARAX/VISTARIL) 25 MG tablet Take 1 tablet (25 mg total) by mouth every 6 (six) hours as needed for anxiety. (Patient not taking: Reported on 02/14/2017) 30 tablet 0  . traZODone (DESYREL) 150 MG tablet  Take 1 tablet (150 mg total) by mouth at bedtime and may repeat dose one time if needed. (Patient not taking: Reported on 02/14/2017) 30 tablet 0  . ziprasidone (GEODON) 40 MG capsule Take 1 capsule (40 mg total) by mouth 2 (two) times daily with a meal. (Patient not taking: Reported on 01/08/2017) 60 capsule 0    Musculoskeletal: Strength & Muscle Tone: within normal limits Gait & Station: normal Patient leans: N/A  Psychiatric Specialty Exam: Physical Exam  ROS  Blood pressure (!) 153/90, pulse 60, temperature 98.6 F (37 C), temperature source Oral, resp. rate 18, height 5' 8" (1.727 m), weight (!) 163.3 kg (360 lb), SpO2 99 %.Body mass index is 54.74 kg/m.  General Appearance: Fairly Groomed  Eye Contact:  Good  Speech:  Clear and Coherent and Normal Rate  Volume:  Normal  Mood:  Depressed  Affect:  Depressed  Thought Process:  Goal Directed  Orientation:  Full (Time, Place, and Person)  Thought Content:  Hallucinations: Auditory  Suicidal Thoughts:  Yes.  without intent/plan  Homicidal Thoughts:  No  Memory:  Immediate;   Good Recent;   Good Remote;   Good  Judgement:  Poor  Insight:  Lacking  Psychomotor Activity:  Tremor  Concentration:  Concentration: Good and Attention Span: Good  Recall:  Good  Fund of Knowledge:  Fair  Language:  Good  Akathisia:  No  Handed:  Right  AIMS (if indicated):     Assets:  Communication Skills Desire for Improvement  ADL's:  Intact  Cognition:  WNL  Sleep:        Treatment Plan Summary: Daily contact with patient to assess and evaluate symptoms and progress in treatment, Medication management and Plan Inpatient psychiatric treatment   Medication management: Cogentin detox protocol:  Opiate withdrawal Vistaril 25 mg Q 6 hr for anxiety Geodon:  Mood stabilization (psychosis) Trazodone for insomnia  Disposition: Recommend psychiatric Inpatient admission when medically cleared.  Lashanda Storlie, NP 02/27/2017 1:30 PM

## 2017-02-27 NOTE — ED Notes (Signed)
Hourly rounding reveals patient in rest room. No complaints, stable, in no acute distress. Q15 minute rounds and monitoring via [employer] to continue. 

## 2017-02-27 NOTE — ED Notes (Signed)
Hourly rounding reveals patient in room. No complaints, stable, in no acute distress. Q15 minute rounds and monitoring via Security Cameras to continue. 

## 2017-02-27 NOTE — ED Notes (Signed)
Pt sleeping at present, no distress noted, calm & cooperative.  Monitoring for safety, Q 15 min checks in effect. 

## 2017-02-28 DIAGNOSIS — R440 Auditory hallucinations: Secondary | ICD-10-CM | POA: Diagnosis not present

## 2017-02-28 DIAGNOSIS — F419 Anxiety disorder, unspecified: Secondary | ICD-10-CM | POA: Diagnosis not present

## 2017-02-28 DIAGNOSIS — F1290 Cannabis use, unspecified, uncomplicated: Secondary | ICD-10-CM | POA: Diagnosis not present

## 2017-02-28 DIAGNOSIS — Z818 Family history of other mental and behavioral disorders: Secondary | ICD-10-CM

## 2017-02-28 DIAGNOSIS — F14959 Cocaine use, unspecified with cocaine-induced psychotic disorder, unspecified: Secondary | ICD-10-CM

## 2017-02-28 DIAGNOSIS — F1910 Other psychoactive substance abuse, uncomplicated: Secondary | ICD-10-CM

## 2017-02-28 DIAGNOSIS — F39 Unspecified mood [affective] disorder: Secondary | ICD-10-CM | POA: Diagnosis not present

## 2017-02-28 MED ORDER — ZIPRASIDONE HCL 40 MG PO CAPS: 40.0000 mg | ORAL_CAPSULE | Freq: Two times a day (BID) | ORAL | 0 refills | Status: DC

## 2017-02-28 MED ORDER — BENZTROPINE MESYLATE 0.5 MG PO TABS: 0.5000 mg | ORAL_TABLET | Freq: Two times a day (BID) | ORAL | 0 refills | Status: DC

## 2017-02-28 MED ORDER — TRAZODONE HCL 150 MG PO TABS: 150.0000 mg | ORAL_TABLET | Freq: Every evening | ORAL | 0 refills | Status: DC | PRN

## 2017-02-28 MED ORDER — HYDROXYZINE HCL 25 MG PO TABS: 25.0000 mg | ORAL_TABLET | Freq: Four times a day (QID) | ORAL | 0 refills | Status: DC | PRN

## 2017-02-28 NOTE — ED Notes (Signed)
Hourly rounding reveals patient sleeping in room. No complaints, stable, in no acute distress. Q15 minute rounds and monitoring via Security Cameras to continue. 

## 2017-02-28 NOTE — Consult Note (Signed)
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 Cocaine use disorder, severe, dependence ([facility]) [F14.20] 01/05/2017    Priority: High  . Polysubstance abuse [F19.10] 02/27/2017  . Auditory hallucinations [R44.0] 02/27/2017  . Substance induced mood disorder ([facility]) [F19.94] 02/27/2017  . Cocaine-induced mood disorder ([facility]) [F14.94] 01/05/2017  . Cocaine use disorder, mild, abuse [F14.10] 05/03/2016  . Cannabis use disorder, mild, abuse [F12.10] 05/03/2016  . Suicide ideation [R45.851] 04/30/2016  . Paranoid schizophrenia ([facility]) [F20.0]   . 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 Alcohol use disorder, moderate, dependence ([facility]) [F10.20] 09/22/2015  . Morbid obesity ([facility]) [E66.01] 09/22/2015    Total Time spent with patient: 30 minutes  Subjective:   Bruce [name] is a 41 y.o. male patient does not warrant admission.  HPI:  41 yo male who presented to the ED with auditory hallucinations after using cocaine on Saturday.  Today, he has no psychosis or suicidal/homicidal ideations, hallucinations, or withdrawal symptoms.  Patient encouraged to use his outpatient resources.  STable for discharge.  Past Psychiatric History: substance abuse  Risk to Self: None Risk to Others: Homicidal Ideation: No Thoughts of Harm to Others: No Comment - Thoughts of Harm to Others: NA Current Homicidal Intent: No Current Homicidal Plan: No Describe Current Homicidal Plan: NA Access to Homicidal Means: No Describe Access to Homicidal Means: NA Identified Victim: NA History of harm to others?: No Assessment of Violence: None Noted Violent Behavior Description:  NA Does patient have access to weapons?: No Criminal Charges Pending?: No Does patient have a court date: No Prior Inpatient Therapy: Prior Inpatient Therapy: Yes Prior Therapy Dates: 2018 Prior Therapy Facilty/Provider(s): [facility] Reason for Treatment: MH issues Prior Outpatient Therapy: Prior Outpatient Therapy: Yes Prior Therapy Dates: 2018 Prior Therapy Facilty/Provider(s): [facility] Reason for Treatment: MH issues Does patient have an ACCT team?: No Does patient have Intensive In-House Services?  : No Does patient have [facility] services? : Yes Does patient have P4CC services?: No  Past Medical History:  Past Medical History:  Diagnosis Date  . Depression   . Obesity   . Schizophrenia ([facility])    History reviewed. No pertinent surgical history. Family History:  Family History  Problem Relation Age of Onset  . Schizophrenia Mother    Family Psychiatric  History: substance abuse Social History:  History  Alcohol Use No     History  Drug Use  . Types: Marijuana, "Crack" cocaine, Cocaine    Comment: "in the past"    Social History   Social History  . Marital status: Single    Spouse name: N/A  . Number of children: N/A  . Years of education: N/A   Social History Main Topics  . Smoking status: Never Smoker  . Smokeless tobacco: Never Used  . Alcohol use No  . Drug use: Yes    Types: Marijuana, "Crack" cocaine, Cocaine     Comment: "in the past"  . Sexual activity: Not Currently   Other Topics Concern  . None   Social History Narrative  . None   Additional Social History:    Allergies:  No Known Allergies  Labs: No results found for this or any previous visit (from the past 48 hour(s)).  Current  Facility-Administered Medications  Medication Dose Route Frequency Provider Last Rate Last Dose  . acetaminophen (TYLENOL) tablet 650 mg  650 mg Oral Q4H PRN Derwood Kaplan, MD   650 mg at 02/27/17 1807  . benztropine (COGENTIN) tablet 0.5 mg  0.5 mg Oral BID WC  Nanavati, Ankit, MD   0.5 mg at 02/27/17 1804  . hydrOXYzine (ATARAX/VISTARIL) tablet 25 mg  25 mg Oral Q6H PRN Nanavati, Ankit, MD      . ondansetron (ZOFRAN) tablet 4 mg  4 mg Oral Q8H PRN Nanavati, Ankit, MD      . traZODone (DESYREL) tablet 150 mg  150 mg Oral QHS,MR X 1 Nanavati, Ankit, MD   Stopped at 02/26/17 2315  . ziprasidone (GEODON) capsule 40 mg  40 mg Oral BID WC Derwood Kaplan, MD   40 mg at 02/28/17 0805   Current Outpatient Prescriptions  Medication Sig Dispense Refill  . acetaminophen (TYLENOL) 500 MG tablet Take 500-1,000 mg by mouth every 6 (six) hours as needed for moderate pain (for pain or headaches).     Marland Kitchen amLODipine (NORVASC) 5 MG tablet Take 5 mg by mouth daily.     . Menthol-Methyl Salicylate (MUSCLE RUB) 10-15 % CREA Apply 1 application topically 3 (three) times daily as needed for muscle pain.    . Paliperidone Palmitate (INVEGA SUSTENNA IM) Inject 150 mg into the muscle every 30 (thirty) days. Pt got last [facility] beginning of June at [facility].    . traZODone (DESYREL) 150 MG tablet Take 1 tablet (150 mg total) by mouth at bedtime and may repeat dose one time if needed. (Patient taking differently: Take 150 mg by mouth at bedtime and may repeat dose one time if needed. Sleep) 30 tablet 0  . benztropine (COGENTIN) 0.5 MG tablet Take 1 tablet (0.5 mg total) by mouth 2 (two) times daily with a meal. (Patient not taking: Reported on 02/26/2017) 60 tablet 0  . benztropine (COGENTIN) 0.5 MG tablet Take 1 tablet (0.5 mg total) by mouth 2 (two) times daily with a meal. (Patient not taking: Reported on 02/14/2017) 60 tablet 0  . hydrOXYzine (ATARAX/VISTARIL) 25 MG tablet Take 1 tablet (25 mg total) by mouth every 6 (six) hours as needed for anxiety. (Patient not taking: Reported on 01/08/2017) 30 tablet 0  . hydrOXYzine (ATARAX/VISTARIL) 25 MG tablet Take 1 tablet (25 mg total) by mouth every 6 (six) hours as needed for anxiety. (Patient not taking: Reported on 02/14/2017) 30 tablet 0   . traZODone (DESYREL) 150 MG tablet Take 1 tablet (150 mg total) by mouth at bedtime and may repeat dose one time if needed. (Patient not taking: Reported on 02/14/2017) 30 tablet 0  . ziprasidone (GEODON) 40 MG capsule Take 1 capsule (40 mg total) by mouth 2 (two) times daily with a meal. (Patient not taking: Reported on 01/08/2017) 60 capsule 0    Musculoskeletal: Strength & Muscle Tone: within normal limits Gait & Station: normal Patient leans: N/A  Psychiatric Specialty Exam: Physical Exam  Constitutional: He is oriented to person, place, and time. He appears well-developed and well-nourished.  HENT:  Head: Normocephalic.  Neck: Normal range of motion.  Respiratory: Effort normal.  Musculoskeletal: Normal range of motion.  Neurological: He is alert and oriented to person, place, and time.  Psychiatric: He has a normal mood and affect. His speech is normal and behavior is normal. Judgment and thought content normal. Cognition and memory are normal.    Review of Systems  Psychiatric/Behavioral: Positive for  substance abuse.  All other systems reviewed and are negative.   Blood pressure (!) 144/79, pulse 60, temperature 98.1 F (36.7 C), temperature source Oral, resp. rate 20, height 5\' 8"  (1.727 m), weight (!) 163.3 kg (360 lb), SpO2 98 %.Body mass index is 54.74 kg/m.  General Appearance: Casual  Eye Contact:  Good  Speech:  Normal Rate  Volume:  Normal  Mood:  Irritable, mild  Affect:  Congruent  Thought Process:  Coherent and Descriptions of Associations: Intact  Orientation:  Full (Time, Place, and Person)  Thought Content:  WDL and Logical  Suicidal Thoughts:  No  Homicidal Thoughts:  No  Memory:  Immediate;   Good Recent;   Good Remote;   Good  Judgement:  Fair  Insight:  Fair  Psychomotor Activity:  Normal  Concentration:  Concentration: Good and Attention Span: Good  Recall:  Good  Fund of Knowledge:  Fair  Language:  Good  Akathisia:  No  Handed:  Right   AIMS (if indicated):     Assets:  Leisure Time Physical Health Resilience  ADL's:  Intact  Cognition:  WNL  Sleep:        Treatment Plan Summary: Daily contact with patient to assess and evaluate symptoms and progress in treatment, Medication management and Plan cocaine abuse with cocaine induced mood disorder:  -Crisis stabilization -Medication management:  Continued Geodon 40 mg BID for mood, Vistaril 25 mg every six hours PRN anxiety, Trazodone 150 mg at bedtime for sleep, and Cogentin 0.5 mg BID for EPS -Individual and substance abuse counseling -Rx resources  Disposition: No evidence of imminent risk to self or others at present.    Nanine MeansLORD, JAMISON, NP 02/28/2017 9:31 AM  Patient seen face-to-face for psychiatric evaluation, chart reviewed and case discussed with the physician extender and developed treatment plan. Reviewed the information documented and agree with the treatment plan. Thedore MinsMojeed Laurali Goddard, MD

## 2017-02-28 NOTE — BH Assessment (Signed)
[facility] Assessment Progress Note  Per Mojeed Akintayo, MD, this pt does not require psychiatric hospitalization at this time.  Pt is to be discharged from [facility] with recommendation to follow up with [facility].  This has been included in pt's discharge instructions.  Pt's nurse, Edie, has been notified.  Bruce Segal, MA Triage Specialist [phone]     

## 2017-02-28 NOTE — ED Notes (Signed)
Hourly rounding reveals patient in room with snack. No complaints, stable, in no acute distress. Q15 minute rounds and monitoring via [employer] to continue.

## 2017-02-28 NOTE — ED Notes (Signed)
Hourly rounding reveals patient in rest room. No complaints, stable, in no acute distress. Q15 minute rounds and monitoring via [employer] to continue. 

## 2017-02-28 NOTE — ED Notes (Signed)
Pt discharged safely with discharge instructions and RX  All belongings were returned to pt.  Bus pass was given.

## 2017-02-28 NOTE — Discharge Instructions (Signed)
For your ongoing mental health needs, you are advised to follow up with [facility].  New and returning patients are seen at their walk-in clinic.  Walk-in hours are Monday - Friday from 8:00 am - 3:00 pm.  Walk-in patients are seen on a first come, first served basis.  Try to arrive as early as possible for he best chance of being seen the same day: ° °     [facility] °     [address] °     [place], [place] [address] °     [phone] °

## 2017-02-28 NOTE — [facility] Suicide Risk Assessment (Signed)
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 Cocaine use disorder, severe, dependence (HCC) [F14.20] 01/05/2017    Priority: High  . Polysubstance abuse [F19.10] 02/27/2017  . Auditory hallucinations [R44.0] 02/27/2017  . Substance induced mood disorder (HCC) [F19.94] 02/27/2017  . Cocaine-induced mood disorder (HCC) [F14.94] 01/05/2017  . Cocaine use disorder, mild, abuse [F14.10] 05/03/2016  . Cannabis use disorder, mild, abuse [F12.10] 05/03/2016  . Suicide ideation [R45.851] 04/30/2016  . Paranoid schizophrenia (HCC) [F20.0]   . 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 Alcohol use disorder, moderate, dependence (HCC) [F10.20] 09/22/2015  . Morbid obesity (HCC) [E66.01] 09/22/2015    Total Time spent with patient: 30 minutes  Musculoskeletal: Strength & Muscle Tone: within normal limits Gait & Station: normal Patient leans: N/A  Psychiatric Specialty Exam: Physical Exam  Constitutional: He is oriented to person, place, and time. He appears well-developed and well-nourished.  HENT:  Head: Normocephalic.  Neck: Normal range of motion.  Respiratory: Effort normal.  Musculoskeletal: Normal range of motion.  Neurological: He is alert and oriented to person, place, and time.  Psychiatric: He has a normal mood and affect. His speech is normal and behavior is normal. Judgment and thought content normal. Cognition and memory are normal.    Review of Systems  Psychiatric/Behavioral: Positive for substance abuse.  All other systems reviewed and are negative.   Blood pressure (!) 144/79, pulse 60, temperature 98.1 F (36.7 C), temperature source Oral, resp. rate 20, height 5\' 8"  (1.727 m), weight (!) 163.3 kg (360  lb), SpO2 98 %.Body mass index is 54.74 kg/m.  General Appearance: Casual  Eye Contact:  Good  Speech:  Normal Rate  Volume:  Normal  Mood:  Irritable, mild  Affect:  Congruent  Thought Process:  Coherent and Descriptions of Associations: Intact  Orientation:  Full (Time, Place, and Person)  Thought Content:  WDL and Logical  Suicidal Thoughts:  No  Homicidal Thoughts:  No  Memory:  Immediate;   Good Recent;   Good Remote;   Good  Judgement:  Fair  Insight:  Fair  Psychomotor Activity:  Normal  Concentration:  Concentration: Good and Attention Span: Good  Recall:  Good  Fund of Knowledge:  Fair  Language:  Good  Akathisia:  No  Handed:  Right  AIMS (if indicated):     Assets:  Leisure Time Physical Health Resilience  ADL's:  Intact  Cognition:  WNL  Sleep:      Mental Status Per Nursing Assessment::   On Admission:   cocaine abuse with psychosis  Demographic Factors:  Male  Loss Factors: NA  Historical Factors: NA  Risk Reduction Factors:   Sense of responsibility to family and Positive therapeutic relationship  Continued Clinical Symptoms:  None  Cognitive Features That Contribute To Risk:  None    Suicide Risk:  Minimal: No identifiable suicidal ideation.  Patients presenting with no risk factors but with morbid ruminations; may be classified as minimal risk based on the severity of the depressive symptoms    Plan Of Care/Follow-up recommendations:  Activity:  as tolerated Diet:  heart healthy diet  Allenmichael Mcpartlin, NP 02/28/2017, 3:15 PM

## 2017-03-16 ENCOUNTER — Encounter (HOSPITAL_COMMUNITY): Payer: Self-pay | Admitting: *Deleted

## 2017-03-16 ENCOUNTER — Emergency Department (HOSPITAL_COMMUNITY)
Admission: EM | Admit: 2017-03-16 | Discharge: 2017-03-17 | Disposition: A | Discharge status: Home / Self Care | Payer: Medicaid Other | Attending: Emergency Medicine | Admitting: Emergency Medicine

## 2017-03-16 DIAGNOSIS — R443 Hallucinations, unspecified: Secondary | ICD-10-CM | POA: Diagnosis not present

## 2017-03-16 DIAGNOSIS — F200 Paranoid schizophrenia: Secondary | ICD-10-CM | POA: Diagnosis not present

## 2017-03-16 DIAGNOSIS — R45850 Homicidal ideations: Secondary | ICD-10-CM | POA: Insufficient documentation

## 2017-03-16 DIAGNOSIS — Z79899 Other long term (current) drug therapy: Secondary | ICD-10-CM | POA: Insufficient documentation

## 2017-03-16 LAB — CBC
HCT: 37 % — ABNORMAL LOW (ref 39.0–52.0)
HEMOGLOBIN: 11.9 g/dL — AB (ref 13.0–17.0)
MCH: 28.1 pg (ref 26.0–34.0)
MCHC: 32.2 g/dL (ref 30.0–36.0)
MCV: 87.5 fL (ref 78.0–100.0)
Platelets: 203 10*3/uL (ref 150–400)
RBC: 4.23 MIL/uL (ref 4.22–5.81)
RDW: 13.9 % (ref 11.5–15.5)
WBC: 6.1 10*3/uL (ref 4.0–10.5)

## 2017-03-16 LAB — RAPID URINE DRUG SCREEN, HOSP PERFORMED
AMPHETAMINES: NOT DETECTED
BARBITURATES: NOT DETECTED
Benzodiazepines: NOT DETECTED
Cocaine: NOT DETECTED
OPIATES: NOT DETECTED
TETRAHYDROCANNABINOL: NOT DETECTED

## 2017-03-16 LAB — ACETAMINOPHEN LEVEL: Acetaminophen (Tylenol), Serum: <10 ug/mL — ABNORMAL LOW (ref 10–30)

## 2017-03-16 LAB — COMPREHENSIVE METABOLIC PANEL
ALBUMIN: 3.3 g/dL — AB (ref 3.5–5.0)
ALT: 14 U/L — ABNORMAL LOW (ref 17–63)
AST: 19 U/L (ref 15–41)
Alkaline Phosphatase: 59 U/L (ref 38–126)
Anion gap: 8 (ref 5–15)
BUN: 10 mg/dL (ref 6–20)
CHLORIDE: 103 mmol/L (ref 101–111)
CO2: 28 mmol/L (ref 22–32)
Calcium: 9 mg/dL (ref 8.9–10.3)
Creatinine, Ser: 0.87 mg/dL (ref 0.61–1.24)
GFR calc Af Amer: >60 mL/min (ref >60)
GFR calc non Af Amer: >60 mL/min (ref >60)
GLUCOSE: 101 mg/dL — AB (ref 65–99)
POTASSIUM: 3.7 mmol/L (ref 3.5–5.1)
Sodium: 139 mmol/L (ref 135–145)
Total Bilirubin: 0.4 mg/dL (ref 0.3–1.2)
Total Protein: 7.2 g/dL (ref 6.5–8.1)

## 2017-03-16 LAB — ETHANOL: Alcohol, Ethyl (B): <5 mg/dL (ref <5)

## 2017-03-16 LAB — SALICYLATE LEVEL: Salicylate Lvl: <7 mg/dL (ref 2.8–30.0)

## 2017-03-16 MED ORDER — HYDROXYZINE HCL 25 MG PO TABS: 25.0000 mg | ORAL_TABLET | Freq: Four times a day (QID) | ORAL | Status: DC | PRN

## 2017-03-16 MED ORDER — TRAZODONE HCL 50 MG PO TABS: 150.0000 mg | ORAL_TABLET | Freq: Every evening | ORAL | Status: DC | PRN

## 2017-03-16 MED ORDER — ACETAMINOPHEN 325 MG PO TABS: 650.0000 mg | ORAL_TABLET | ORAL | Status: DC | PRN

## 2017-03-16 MED ORDER — ZIPRASIDONE HCL 40 MG PO CAPS
40.0000 mg | ORAL_CAPSULE | Freq: Two times a day (BID) | ORAL | Status: DC
  Administered 2017-03-17: 40 mg via ORAL
  Filled 2017-03-16 (×3): qty 1

## 2017-03-16 MED ORDER — AMLODIPINE BESYLATE 5 MG PO TABS
5.0000 mg | ORAL_TABLET | Freq: Every day | ORAL | Status: DC
  Administered 2017-03-17: 5 mg via ORAL
  Filled 2017-03-16: qty 1

## 2017-03-16 NOTE — ED Notes (Signed)
Have ordered pt a diet tray.

## 2017-03-16 NOTE — ED Notes (Signed)
Pt given meal with sprite.

## 2017-03-16 NOTE — ED Notes (Signed)
ED Provider at bedside. 

## 2017-03-16 NOTE — ED Triage Notes (Signed)
Pt reports hearing voices and having visual hallucinations of seeing the devil. Reports that the voices are telling him to go to a high school and shoot kids. Calm and cooperative at triage.

## 2017-03-16 NOTE — ED Provider Notes (Signed)
[facility]-EMERGENCY DEPT Provider Note   CSN: [id number] Arrival date & time: 03/16/17  1621     History   Chief Complaint Chief Complaint  Patient presents with  . Suicidal    HPI Arlan OrganDwight Mezo is a 41 y.o. male.  HPI Patient states is having increasing problems of visual hallucinations and hearing voices. He reports once his roommate leaves, it seems like the whole room gets really hot and then a demon that is a white guy with horns comes on the wall. He reports this demon tells him to do bad things like get his friends automatic assault rifle and go to school issue children. The patient reports that this demon is coming more frequently and when he comes he has worn more other demons with him. Patient reports he gets his monthly and Invega shot but is not helping with these visual hallucinations that are very disturbing. The patient reports he doesn't want to shoot anybody but he keeps getting told to do it. Past Medical History:  Diagnosis Date  . Depression   . Obesity   . Schizophrenia [facility](HCC)     Patient Active Problem List   Diagnosis Date Noted  . 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 Polysubstance abuse 02/27/2017  . Auditory hallucinations 02/27/2017  . Substance induced mood disorder (HCC) 02/27/2017  . Cocaine use disorder, severe, dependence (HCC) 01/05/2017  . 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 Cocaine use disorder, mild, abuse 05/03/2016  . Cannabis use disorder, mild, abuse 05/03/2016  . Suicide ideation 04/30/2016  . Paranoid schizophrenia (HCC)   . Hyperprolactinemia (HCC) 09/24/2015  . Alcohol use disorder, moderate, dependence (HCC) 09/22/2015  . Morbid obesity (HCC) 09/22/2015    History reviewed. No pertinent surgical history.     Home Medications    Prior to Admission medications   Medication Sig Start Date End Date Taking? Authorizing Provider  traZODone (DESYREL) 150 MG tablet Take 1 tablet (150 mg total) by mouth  at bedtime and may repeat dose one time if needed. 02/28/17  Yes Charm RingsLord, Jamison Y, NP  acetaminophen (TYLENOL) 500 MG tablet Take 500-1,000 mg by mouth every 6 (six) hours as needed for moderate pain (for pain or headaches).     [provider]  amLODipine (NORVASC) 5 MG tablet Take 5 mg by mouth daily.  01/08/17 01/08/18  [provider]  benztropine (COGENTIN) 0.5 MG tablet Take 1 tablet (0.5 mg total) by mouth 2 (two) times daily with a meal. Patient not taking: Reported on 02/14/2017 01/05/17   Adonis BrookAgustin, Sheila, NP  benztropine (COGENTIN) 0.5 MG tablet Take 1 tablet (0.5 mg total) by mouth 2 (two) times daily with a meal. 02/28/17   Charm RingsLord, Jamison Y, NP  hydrOXYzine (ATARAX/VISTARIL) 25 MG tablet Take 1 tablet (25 mg total) by mouth every 6 (six) hours as needed for anxiety. Patient not taking: Reported on 02/14/2017 01/05/17   Adonis BrookAgustin, Sheila, NP  hydrOXYzine (ATARAX/VISTARIL) 25 MG tablet Take 1 tablet (25 mg total) by mouth every 6 (six) hours as needed for anxiety. 02/28/17   Charm RingsLord, Jamison Y, NP  Menthol-Methyl Salicylate (MUSCLE RUB) 10-15 % CREA Apply 1 application topically 3 (three) times daily as needed for muscle pain.    [provider]  Paliperidone Palmitate (INVEGA SUSTENNA IM) Inject 150 mg into the muscle every 30 (thirty) days. [facility]    [provider]  traZODone (DESYREL) 150 MG tablet Take 1 tablet (150 mg total)  by mouth at bedtime and may repeat dose one time if needed. Patient taking differently: Take 150 mg by mouth at bedtime and may repeat dose one time if needed. Sleep 10/15/16   Adonis Brook, NP  ziprasidone (GEODON) 40 MG capsule Take 1 capsule (40 mg total) by mouth 2 (two) times daily with a meal. 02/28/17   Charm Rings, NP    Family History Family History  Problem Relation Age of Onset  . Schizophrenia Mother     Social History Social History  Substance Use Topics  . Smoking status: Never  Smoker  . Smokeless tobacco: Never Used  . Alcohol use No     Allergies   Patient has no known allergies.   Review of Systems Review of Systems 10 Systems reviewed and are negative for acute change except as noted in the HPI.   Physical Exam Updated Vital Signs BP (!) 154/97 (BP Location: Left Arm)   Pulse 88   Temp 99.2 F (37.3 C) (Oral)   Resp 20   SpO2 100%   Physical Exam  Constitutional: He is oriented to person, place, and time.  Patient is alert and nontoxic. He shows no respiratory distress. He has good eye contact. Morbid obesity.  HENT:  Head: Normocephalic and atraumatic.  Mouth/Throat: Oropharynx is clear and moist.  Eyes: Conjunctivae are normal.  Neck: Neck supple.  Cardiovascular: Normal rate and regular rhythm.   No murmur heard. Pulmonary/Chest: Effort normal and breath sounds normal. No respiratory distress.  Abdominal: Soft. There is no tenderness.  Musculoskeletal: Normal range of motion. He exhibits no edema or tenderness.  Neurological: He is alert and oriented to person, place, and time. He exhibits normal muscle tone. Coordination normal.  Skin: Skin is warm and dry.  Psychiatric: He has a normal mood and affect.  Nursing note and vitals reviewed.    ED Treatments / Results  Labs (all labs ordered are listed, but only abnormal results are displayed) Labs Reviewed  COMPREHENSIVE METABOLIC PANEL - Abnormal; Notable for the following:       Result Value   Glucose, Bld 101 (*)    Albumin 3.3 (*)    ALT 14 (*)    All other components within normal limits  ACETAMINOPHEN LEVEL - Abnormal; Notable for the following:    Acetaminophen (Tylenol), Serum <10 (*)    All other components within normal limits  CBC - Abnormal; Notable for the following:    Hemoglobin 11.9 (*)    HCT 37.0 (*)    All other components within normal limits  ETHANOL  SALICYLATE LEVEL  RAPID URINE DRUG SCREEN, HOSP PERFORMED    EKG  EKG Interpretation None        Radiology No results found.  Procedures Procedures (including critical care time)  Medications Ordered in ED Medications  acetaminophen (TYLENOL) tablet 650 mg (not administered)  amLODipine (NORVASC) tablet 5 mg (not administered)  hydrOXYzine (ATARAX/VISTARIL) tablet 25 mg (not administered)  traZODone (DESYREL) tablet 150 mg (not administered)  ziprasidone (GEODON) capsule 40 mg (not administered)     Initial Impression / Assessment and Plan / ED Course  I have reviewed the triage vital signs and the nursing notes.  Pertinent labs & imaging results that were available during my care of the patient were reviewed by me and considered in my medical decision making (see chart for details).     Final Clinical Impressions(s) / ED Diagnoses   Final diagnoses:  Paranoid schizophrenia (HCC)  Hallucinations  Homicidal ideation   Patient reports increasing thoughts of harming someone that are being instilled by visual hallucinations of a demon that appears with multiple others. The patient is nontoxic and alert. He does not appear to have any acute medical illnesses. Patient is stable and medically cleared for psychiatric evaluation and treatment. New Prescriptions New Prescriptions   No medications on file     Arby BarrettePfeiffer, Boyce Keltner, MD 03/16/17 2209

## 2017-03-16 NOTE — ED Notes (Signed)
TTS in progress 
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
## 2017-03-17 NOTE — ED Notes (Signed)
Pt left with ACT team member before this RN could go over D/C papers.

## 2017-03-17 NOTE — ED Notes (Signed)
ACT Team member here to pick up pt.

## 2017-03-17 NOTE — ED Notes (Signed)
Message sent to pharmacy requesting geodon.

## 2017-03-17 NOTE — Discharge Instructions (Signed)
Please follow-up closely with your mental health provider.

## 2017-03-17 NOTE — Progress Notes (Signed)
Per Elta GuadeloupeLaurie Parks, NP patient does not meet criteria for inpatient treatment.Patient is recommended for discharge and [facility].   Per [facility]legario MessierKathy at [facility], [facility].   CSW spoke with [place] [facility].   Per Wandra Mannanameka, Ms. Albin FellingCarla [facility] will call CSW with a time when she is en route to pick patient up.   Enriqueta ShutterLynnze Bishop, RN notified.     Baldo DaubJolan Colm Lyford MSW, LCSWA CSW Disposition [phone]

## 2017-03-17 NOTE — ED Notes (Signed)
Pts belongings returned to pt. Pt signed valuables envelope and inventory sheet.

## 2017-03-17 NOTE — Progress Notes (Signed)
CSW received a phone call from RoystonGina with [facility] ACTT. Per Almira CoasterGina, she and Ms. Albin FellingCarla will pick patient up around 11:30am - 12:00pm.   Enriqueta ShutterLynnze Bishop, RN notified.    Baldo DaubJolan Keiva Dina MSW, LCSWA CSW Disposition [phone]

## 2017-03-17 NOTE — Progress Notes (Signed)
Patient meets criteria for inpatient treatment. CSW faxed referrals to the following [facility] facilities for review:  [facility], [facility], [facility], [facility], [facility], [facility]   TTS will continue to seek bed placement.  Baldo DaubJolan Leston Schueller MSW, LCSWA CSW Disposition [phone]

## 2017-03-17 NOTE — ED Notes (Signed)
Pt currently endorses SI and HI, states that he is currently having visual and auditory hallucinations. Pt states that he is hearing voices telling him to hurt himself and others, states that he is seeing "shadow balls moving around".

## 2017-03-17 NOTE — ED Notes (Signed)
Pt belongings located in [facility] locker [facility]6. Valuables located with security.

## 2017-03-31 ENCOUNTER — Emergency Department (HOSPITAL_COMMUNITY)
Admission: EM | Admit: 2017-03-31 | Discharge: 2017-04-02 | Disposition: A | Discharge status: Home / Self Care | Payer: Medicaid Other | Attending: Emergency Medicine | Admitting: Emergency Medicine

## 2017-03-31 ENCOUNTER — Encounter (HOSPITAL_COMMUNITY): Payer: Self-pay | Admitting: Emergency Medicine

## 2017-03-31 DIAGNOSIS — Z79899 Other long term (current) drug therapy: Secondary | ICD-10-CM | POA: Insufficient documentation

## 2017-03-31 DIAGNOSIS — R45850 Homicidal ideations: Secondary | ICD-10-CM | POA: Diagnosis not present

## 2017-03-31 DIAGNOSIS — R440 Auditory hallucinations: Secondary | ICD-10-CM | POA: Insufficient documentation

## 2017-03-31 DIAGNOSIS — R45851 Suicidal ideations: Secondary | ICD-10-CM | POA: Diagnosis not present

## 2017-03-31 LAB — RAPID URINE DRUG SCREEN, HOSP PERFORMED
AMPHETAMINES: NOT DETECTED
BENZODIAZEPINES: NOT DETECTED
Barbiturates: NOT DETECTED
Cocaine: POSITIVE — AB
OPIATES: NOT DETECTED
TETRAHYDROCANNABINOL: NOT DETECTED

## 2017-03-31 LAB — CBC
HCT: 36 % — ABNORMAL LOW (ref 39.0–52.0)
HEMOGLOBIN: 11.6 g/dL — AB (ref 13.0–17.0)
MCH: 28.2 pg (ref 26.0–34.0)
MCHC: 32.2 g/dL (ref 30.0–36.0)
MCV: 87.6 fL (ref 78.0–100.0)
Platelets: 279 10*3/uL (ref 150–400)
RBC: 4.11 MIL/uL — AB (ref 4.22–5.81)
RDW: 14.3 % (ref 11.5–15.5)
WBC: 7.3 10*3/uL (ref 4.0–10.5)

## 2017-03-31 LAB — COMPREHENSIVE METABOLIC PANEL
ALBUMIN: 3.4 g/dL — AB (ref 3.5–5.0)
ALT: 18 U/L (ref 17–63)
AST: 26 U/L (ref 15–41)
Alkaline Phosphatase: 61 U/L (ref 38–126)
Anion gap: 6 (ref 5–15)
BUN: 15 mg/dL (ref 6–20)
CHLORIDE: 103 mmol/L (ref 101–111)
CO2: 30 mmol/L (ref 22–32)
CREATININE: 0.99 mg/dL (ref 0.61–1.24)
Calcium: 9 mg/dL (ref 8.9–10.3)
GFR calc Af Amer: >60 mL/min (ref >60)
GFR calc non Af Amer: >60 mL/min (ref >60)
Glucose, Bld: 91 mg/dL (ref 65–99)
POTASSIUM: 4.5 mmol/L (ref 3.5–5.1)
SODIUM: 139 mmol/L (ref 135–145)
Total Bilirubin: 0.6 mg/dL (ref 0.3–1.2)
Total Protein: 7.7 g/dL (ref 6.5–8.1)

## 2017-03-31 LAB — ACETAMINOPHEN LEVEL: Acetaminophen (Tylenol), Serum: <10 ug/mL — ABNORMAL LOW (ref 10–30)

## 2017-03-31 LAB — SALICYLATE LEVEL: Salicylate Lvl: <7 mg/dL (ref 2.8–30.0)

## 2017-03-31 LAB — ETHANOL: Alcohol, Ethyl (B): <5 mg/dL (ref <5)

## 2017-03-31 MED ORDER — AMLODIPINE BESYLATE 5 MG PO TABS
5.0000 mg | ORAL_TABLET | Freq: Every day | ORAL | Status: DC
  Administered 2017-03-31 – 2017-04-02 (×2): 5 mg via ORAL
  Filled 2017-03-31 (×2): qty 1

## 2017-03-31 MED ORDER — BENZTROPINE MESYLATE 1 MG PO TABS
0.5000 mg | ORAL_TABLET | Freq: Two times a day (BID) | ORAL | Status: DC
  Administered 2017-04-01 – 2017-04-02 (×2): 0.5 mg via ORAL
  Filled 2017-03-31 (×2): qty 1

## 2017-03-31 MED ORDER — TRAZODONE HCL 50 MG PO TABS
150.0000 mg | ORAL_TABLET | Freq: Every evening | ORAL | Status: DC | PRN
  Administered 2017-03-31 – 2017-04-01 (×2): 150 mg via ORAL
  Filled 2017-03-31 (×2): qty 1

## 2017-03-31 MED ORDER — HYDROXYZINE HCL 25 MG PO TABS: 25.0000 mg | ORAL_TABLET | Freq: Four times a day (QID) | ORAL | Status: DC | PRN

## 2017-03-31 MED ORDER — ACETAMINOPHEN 500 MG PO TABS
500.0000 mg | ORAL_TABLET | Freq: Four times a day (QID) | ORAL | Status: DC | PRN
  Administered 2017-03-31: 500 mg via ORAL
  Administered 2017-04-01: 1000 mg via ORAL
  Filled 2017-03-31: qty 1
  Filled 2017-03-31: qty 2

## 2017-03-31 NOTE — ED Notes (Signed)
Belongings placed in locker #2. Sheet was signed and dated by staff and pt.
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
## 2017-03-31 NOTE — ED Notes (Signed)
Sitter called for patient. Pt states he is a 5X size, and is unable to fit in the purple scrubs. Given 2 Large gowns to change into.

## 2017-03-31 NOTE — ED Provider Notes (Signed)
[facility]-EMERGENCY DEPT Provider Note   CSN: [id number] Arrival date & time: 03/31/17  1401     History   Chief Complaint Chief Complaint  Patient presents with  . Suicidal  . Homicidal  . Hallucinations    HPI Bruce [name] is a 41 y.o. male.  HPI Patient presents to the emergency room for evaluation of suicidal ideation. Patient has a history of schizophrenia. He was recently in the emergency room back on July 25 and was admitted to a psychiatric hospital after that visit. Patient states he improved during his hospitalization but in the last week or so his symptoms have returned. He states the medications are no longer working. He is hearing voices. The voices are telling him to kill himself as well as other people. Patient denies any other complaints. No fevers or chills. No vomiting or diarrhea. Past Medical History:  Diagnosis Date  . Depression   . Obesity   . Schizophrenia [facility](HCC)     Patient Active Problem List   Diagnosis Date Noted  . 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 Polysubstance abuse 02/27/2017  . Auditory hallucinations 02/27/2017  . Substance induced mood disorder (HCC) 02/27/2017  . Cocaine use disorder, severe, dependence (HCC) 01/05/2017  . 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 Cocaine use disorder, mild, abuse 05/03/2016  . Cannabis use disorder, mild, abuse 05/03/2016  . Suicide ideation 04/30/2016  . Paranoid schizophrenia (HCC)   . Hyperprolactinemia (HCC) 09/24/2015  . Alcohol use disorder, moderate, dependence (HCC) 09/22/2015  . Morbid obesity (HCC) 09/22/2015    No past surgical history on file.     Home Medications    Prior to Admission medications   Medication Sig Start Date End Date Taking? Authorizing Provider  acetaminophen (TYLENOL) 500 MG tablet Take 500-1,000 mg by mouth every 6 (six) hours as needed for moderate pain (for pain or headaches).     [provider]  amLODipine (NORVASC) 5 MG  tablet Take 5 mg by mouth daily.  01/08/17 01/08/18  [provider]  benztropine (COGENTIN) 0.5 MG tablet Take 1 tablet (0.5 mg total) by mouth 2 (two) times daily with a meal. Patient not taking: Reported on 03/16/2017 01/05/17   Adonis BrookAgustin, Sheila, NP  benztropine (COGENTIN) 0.5 MG tablet Take 1 tablet (0.5 mg total) by mouth 2 (two) times daily with a meal. 02/28/17   Charm RingsLord, Jamison Y, NP  hydrOXYzine (ATARAX/VISTARIL) 25 MG tablet Take 1 tablet (25 mg total) by mouth every 6 (six) hours as needed for anxiety. Patient not taking: Reported on 02/14/2017 01/05/17   Adonis BrookAgustin, Sheila, NP  hydrOXYzine (ATARAX/VISTARIL) 25 MG tablet Take 1 tablet (25 mg total) by mouth every 6 (six) hours as needed for anxiety. 02/28/17   Charm RingsLord, Jamison Y, NP  Menthol-Methyl Salicylate (MUSCLE RUB) 10-15 % CREA Apply 1 application topically 3 (three) times daily as needed for muscle pain.    [provider]  Paliperidone Palmitate (INVEGA SUSTENNA IM) Inject 150 mg into the muscle every 30 (thirty) days. [facility]    [provider]  traZODone (DESYREL) 150 MG tablet Take 1 tablet (150 mg total) by mouth at bedtime and may repeat dose one time if needed. 02/28/17   Charm RingsLord, Jamison Y, NP    Family History Family History  Problem Relation Age of Onset  . Schizophrenia Mother     Social History Social History  Substance Use Topics  . Smoking status: Never Smoker  .  Smokeless tobacco: Never Used  . Alcohol use No     Allergies   Patient has no known allergies.   Review of Systems Review of Systems  All other systems reviewed and are negative.    Physical Exam Updated Vital Signs There were no vitals taken for this visit.  Physical Exam  Constitutional: No distress.  Morbidly obese  HENT:  Head: Normocephalic and atraumatic.  Right Ear: External ear normal.  Left Ear: External ear normal.  Eyes: Conjunctivae are normal. Right eye exhibits no  discharge. Left eye exhibits no discharge. No scleral icterus.  Neck: Neck supple. No tracheal deviation present.  Cardiovascular: Normal rate, regular rhythm and intact distal pulses.   Pulmonary/Chest: Effort normal and breath sounds normal. No stridor. No respiratory distress. He has no wheezes. He has no rales.  Abdominal: Soft. Bowel sounds are normal. He exhibits no distension. There is no tenderness. There is no rebound and no guarding.  Musculoskeletal: He exhibits no edema or tenderness.  Neurological: He is alert. He has normal strength. No cranial nerve deficit (no facial droop, extraocular movements intact, no slurred speech) or sensory deficit. He exhibits normal muscle tone. He displays no seizure activity. Coordination normal.  Skin: Skin is warm and dry. No rash noted.  Psychiatric: He has a normal mood and affect.  Nursing note and vitals reviewed.    ED Treatments / Results  Labs (all labs ordered are listed, but only abnormal results are displayed) Labs Reviewed  COMPREHENSIVE METABOLIC PANEL - Abnormal; Notable for the following:       Result Value   Albumin 3.4 (*)    All other components within normal limits  ACETAMINOPHEN LEVEL - Abnormal; Notable for the following:    Acetaminophen (Tylenol), Serum <10 (*)    All other components within normal limits  CBC - Abnormal; Notable for the following:    RBC 4.11 (*)    Hemoglobin 11.6 (*)    HCT 36.0 (*)    All other components within normal limits  RAPID URINE DRUG SCREEN, HOSP PERFORMED - Abnormal; Notable for the following:    Cocaine POSITIVE (*)    All other components within normal limits  ETHANOL  SALICYLATE LEVEL    Procedures Procedures (including critical care time)  Medications Ordered in ED Medications  acetaminophen (TYLENOL) tablet 500-1,000 mg (not administered)  amLODipine (NORVASC) tablet 5 mg (not administered)  benztropine (COGENTIN) tablet 0.5 mg (not administered)  hydrOXYzine  (ATARAX/VISTARIL) tablet 25 mg (not administered)  traZODone (DESYREL) tablet 150 mg (not administered)     Initial Impression / Assessment and Plan / ED Course  I have reviewed the triage vital signs and the nursing notes.  Pertinent labs & imaging results that were available during my care of the patient were reviewed by me and considered in my medical decision making (see chart for details).   patient's laboratory tests obtained and reviewed. He has a mild anemia but this is stable compared to previous labs. His urine drug screen is positive for cocaine.  The patient appears medically stable. He is cleared for psychiatric evaluation. I will consult the psychiatric service  Final Clinical Impressions(s) / ED Diagnoses   Final diagnoses:  None    New Prescriptions New Prescriptions   No medications on file     Linwood Dibbles, MD 03/31/17 2348

## 2017-03-31 NOTE — ED Triage Notes (Addendum)
Pt reports 2 weeks of hallucinations telling him to kill other people and himself. wanded by security, belongings in plastic bags X2 with pt label.

## 2017-04-01 DIAGNOSIS — Z818 Family history of other mental and behavioral disorders: Secondary | ICD-10-CM

## 2017-04-01 DIAGNOSIS — R45851 Suicidal ideations: Secondary | ICD-10-CM

## 2017-04-01 DIAGNOSIS — G4700 Insomnia, unspecified: Secondary | ICD-10-CM

## 2017-04-01 DIAGNOSIS — R45850 Homicidal ideations: Secondary | ICD-10-CM

## 2017-04-01 DIAGNOSIS — F1490 Cocaine use, unspecified, uncomplicated: Secondary | ICD-10-CM

## 2017-04-01 DIAGNOSIS — F209 Schizophrenia, unspecified: Secondary | ICD-10-CM

## 2017-04-01 DIAGNOSIS — F1910 Other psychoactive substance abuse, uncomplicated: Secondary | ICD-10-CM

## 2017-04-01 DIAGNOSIS — F1290 Cannabis use, unspecified, uncomplicated: Secondary | ICD-10-CM

## 2017-04-01 DIAGNOSIS — R440 Auditory hallucinations: Secondary | ICD-10-CM

## 2017-04-01 MED ORDER — HALOPERIDOL 2 MG PO TABS
2.0000 mg | ORAL_TABLET | Freq: Two times a day (BID) | ORAL | Status: DC
  Administered 2017-04-01 – 2017-04-02 (×2): 2 mg via ORAL
  Filled 2017-04-01 (×2): qty 1

## 2017-04-01 NOTE — ED Notes (Signed)
Staff sitter came to RN to notify that it is her time to leave. Sitter stated that she called staffing office and they did not have another staff member to replace her. RN Physiological scientistnotified Charge Nurse and patients primary nurse about situation.

## 2017-04-01 NOTE — ED Notes (Signed)
Pt rcvd snack
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
## 2017-04-01 NOTE — ED Notes (Signed)
Snack given.

## 2017-04-01 NOTE — ED Notes (Signed)
TTS in process 

## 2017-04-01 NOTE — ED Notes (Signed)
Lunch tray ordered 

## 2017-04-01 NOTE — ED Notes (Signed)
Patient in shower 

## 2017-04-02 ENCOUNTER — Encounter (HOSPITAL_COMMUNITY): Payer: Self-pay | Admitting: Nurse Practitioner

## 2017-04-02 ENCOUNTER — Emergency Department (HOSPITAL_COMMUNITY)
Admission: EM | Admit: 2017-04-02 | Discharge: 2017-04-02 | Disposition: A | Discharge status: Other Acute Inpatient Hospital | Payer: Medicaid Other | Source: Home / Self Care | Attending: Emergency Medicine | Admitting: Emergency Medicine

## 2017-04-02 DIAGNOSIS — R440 Auditory hallucinations: Secondary | ICD-10-CM

## 2017-04-02 LAB — COMPREHENSIVE METABOLIC PANEL
ALK PHOS: 66 U/L (ref 38–126)
ALT: 18 U/L (ref 17–63)
ANION GAP: 9 (ref 5–15)
AST: 22 U/L (ref 15–41)
Albumin: 3.7 g/dL (ref 3.5–5.0)
BILIRUBIN TOTAL: 0.3 mg/dL (ref 0.3–1.2)
BUN: 12 mg/dL (ref 6–20)
CALCIUM: 9.4 mg/dL (ref 8.9–10.3)
CO2: 29 mmol/L (ref 22–32)
Chloride: 101 mmol/L (ref 101–111)
Creatinine, Ser: 1.05 mg/dL (ref 0.61–1.24)
Glucose, Bld: 81 mg/dL (ref 65–99)
POTASSIUM: 3.6 mmol/L (ref 3.5–5.1)
Sodium: 139 mmol/L (ref 135–145)
TOTAL PROTEIN: 8.6 g/dL — AB (ref 6.5–8.1)

## 2017-04-02 LAB — RAPID URINE DRUG SCREEN, HOSP PERFORMED
Amphetamines: NOT DETECTED
BARBITURATES: NOT DETECTED
BENZODIAZEPINES: NOT DETECTED
COCAINE: POSITIVE — AB
Opiates: NOT DETECTED
Tetrahydrocannabinol: NOT DETECTED

## 2017-04-02 LAB — CBC
HEMATOCRIT: 39.5 % (ref 39.0–52.0)
Hemoglobin: 12.8 g/dL — ABNORMAL LOW (ref 13.0–17.0)
MCH: 28.5 pg (ref 26.0–34.0)
MCHC: 32.4 g/dL (ref 30.0–36.0)
MCV: 88 fL (ref 78.0–100.0)
PLATELETS: 283 10*3/uL (ref 150–400)
RBC: 4.49 MIL/uL (ref 4.22–5.81)
RDW: 14.4 % (ref 11.5–15.5)
WBC: 7.1 10*3/uL (ref 4.0–10.5)

## 2017-04-02 LAB — ETHANOL

## 2017-04-02 LAB — TROPONIN I: Troponin I: <0.03 ng/mL (ref <0.03)

## 2017-04-02 LAB — ACETAMINOPHEN LEVEL

## 2017-04-02 LAB — SALICYLATE LEVEL

## 2017-04-02 NOTE — Progress Notes (Signed)
TTS consult ordered at 19:10. Pt was assessed by TTS and NP on 04/02/17 and was [facility]. Case discussed with EDP Dr. Adriana Simasook, MD to advise pt has already been assessed by TTS on 04/02/17 and currently needs medical clearance and to [facility] medically cleared. EDP to remove consult request.  Princess BruinsAquicha Kasmira Cacioppo, MSW, LCSWA TTS Specialist [phone]

## 2017-04-02 NOTE — Progress Notes (Signed)
Patient meets criteria for inpatient treatment. CSW faxed referrals to the following inpatient facilities for review:   [facility], [facility], [facility] [address] [facility], [facility] [address] [place], [facility], [facility], [facility]  TTS will continue to seek bed placement.   Baldo DaubJolan Natania Finigan MSW, LCSWA CSW Disposition [phone]

## 2017-04-02 NOTE — ED Notes (Signed)
Sandwich and Sprite given to pt as requested for snack.

## 2017-04-02 NOTE — Progress Notes (Signed)
Per Ferne ReusJustina Okonkwo, NP, patient does not meet criteria for inpatient treatment. Patient is recommended for discharge when medically clear.   It is recommended for the patient to continue to follow up with [facility] at discharge for medication management and therapy appointments.   Jerrol Bananaebecca Berman, RN notified.   Baldo DaubJolan Mackey Varricchio MSW, LCSWA CSW Disposition [phone]

## 2017-04-02 NOTE — Discharge Instructions (Signed)
[facility] and to get help with your drug problem
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
## 2017-04-02 NOTE — ED Triage Notes (Signed)
[facility] with [facility] request for medical clearance including ekg and cardiac work up per sending paper work. Pt states states he went there seeking help because he is hearing voices.

## 2017-04-02 NOTE — ED Notes (Addendum)
ALL belongings returned to pt - 2 labeled belongings bags - Pt signed verifying all present. Pt given bus pass and outpt referrals w/[facility] included. Offered pt to see SW d/t states has no where to go - declined.

## 2017-04-02 NOTE — ED Provider Notes (Signed)
[facility]-EMERGENCY DEPT Provider Note   CSN: [id number] Arrival date & time: 04/02/17  1735     History   Chief Complaint Chief Complaint  Patient presents with  . Hallucinations  . Medical Clearance    HPI Arlan OrganDwight Vantrease is a 41 y.o. male.  [facility]. The patient claims to be hearing voices. He has been smoking crack lately. He has had previous psychiatric admissions in the past.  Past medical history includes polysubstance abuse, paranoid schizophrenia, alcohol abuse, morbid obesity.  No suicidal or homicidal ideation.      Past Medical History:  Diagnosis Date  . Depression   . Obesity   . Schizophrenia [facility](HCC)     Patient Active Problem List   Diagnosis Date Noted  . 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 Polysubstance abuse 02/27/2017  . Auditory hallucinations 02/27/2017  . Substance induced mood disorder (HCC) 02/27/2017  . Cocaine use disorder, severe, dependence (HCC) 01/05/2017  . 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 Cocaine use disorder, mild, abuse 05/03/2016  . Cannabis use disorder, mild, abuse 05/03/2016  . Suicide ideation 04/30/2016  . Paranoid schizophrenia (HCC)   . Hyperprolactinemia (HCC) 09/24/2015  . Alcohol use disorder, moderate, dependence (HCC) 09/22/2015  . Morbid obesity (HCC) 09/22/2015    History reviewed. No pertinent surgical history.     Home Medications    Prior to Admission medications   Medication Sig Start Date End Date Taking? Authorizing Provider  acetaminophen (TYLENOL) 500 MG tablet Take 500-1,000 mg by mouth every 6 (six) hours as needed for moderate pain (for pain or headaches).     [provider]  amLODipine (NORVASC) 5 MG tablet Take 5 mg by mouth daily.  01/08/17 01/08/18  [provider]  benztropine (COGENTIN) 0.5 MG tablet Take 1 tablet (0.5 mg total) by mouth 2 (two) times daily with a meal. Patient not taking: Reported  on 03/16/2017 01/05/17   Adonis BrookAgustin, Sheila, NP  benztropine (COGENTIN) 0.5 MG tablet Take 1 tablet (0.5 mg total) by mouth 2 (two) times daily with a meal. 02/28/17   Charm RingsLord, Jamison Y, NP  hydrOXYzine (ATARAX/VISTARIL) 25 MG tablet Take 1 tablet (25 mg total) by mouth every 6 (six) hours as needed for anxiety. 01/05/17   Adonis BrookAgustin, Sheila, NP  hydrOXYzine (ATARAX/VISTARIL) 25 MG tablet Take 1 tablet (25 mg total) by mouth every 6 (six) hours as needed for anxiety. Patient not taking: Reported on 03/31/2017 02/28/17   Charm RingsLord, Jamison Y, NP  Paliperidone Palmitate (INVEGA SUSTENNA IM) Inject 150 mg into the muscle every 30 (thirty) days.     [provider]  traZODone (DESYREL) 150 MG tablet Take 1 tablet (150 mg total) by mouth at bedtime and may repeat dose one time if needed. 02/28/17   Charm RingsLord, Jamison Y, NP    Family History Family History  Problem Relation Age of Onset  . Schizophrenia Mother     Social History Social History  Substance Use Topics  . Smoking status: Never Smoker  . Smokeless tobacco: Never Used  . Alcohol use No     Allergies   Patient has no known allergies.   Review of Systems Review of Systems  All other systems reviewed and are negative.    Physical Exam Updated Vital Signs BP (!) 165/94 (BP Location: Left Wrist)   Pulse 68   Temp 97.7 F (36.5 C) (Oral)   Resp 16   SpO2 98%   Physical Exam  Constitutional:  He is oriented to person, place, and time. He appears well-developed and well-nourished.  obese  HENT:  Head: Normocephalic and atraumatic.  Eyes: Conjunctivae are normal.  Neck: Neck supple.  Cardiovascular: Normal rate and regular rhythm.   Pulmonary/Chest: Effort normal and breath sounds normal.  Abdominal: Soft. Bowel sounds are normal.  Musculoskeletal: Normal range of motion.  Neurological: He is alert and oriented to person, place, and time.  Skin: Skin is warm and dry.  Psychiatric: His behavior is normal.  Flat affect, no obvious  psychosis.  Nursing note and vitals reviewed.    ED Treatments / Results  Labs (all labs ordered are listed, but only abnormal results are displayed) Labs Reviewed  COMPREHENSIVE METABOLIC PANEL - Abnormal; Notable for the following:       Result Value   Total Protein 8.6 (*)    All other components within normal limits  ACETAMINOPHEN LEVEL - Abnormal; Notable for the following:    Acetaminophen (Tylenol), Serum <10 (*)    All other components within normal limits  CBC - Abnormal; Notable for the following:    Hemoglobin 12.8 (*)    All other components within normal limits  RAPID URINE DRUG SCREEN, HOSP PERFORMED - Abnormal; Notable for the following:    Cocaine POSITIVE (*)    All other components within normal limits  ETHANOL  SALICYLATE LEVEL  TROPONIN I    EKG  EKG Interpretation  Date/Time:  Saturday April 02 2017 18:47:53 EDT Ventricular Rate:  70 PR Interval:    QRS Duration: 92 QT Interval:  405 QTC Calculation: 437 R Axis:   63 Text Interpretation:  Sinus rhythm Confirmed by Donnetta Hutching ([id number]) on 04/02/2017 7:50:38 PM       Radiology No results found.  Procedures Procedures (including critical care time)  Medications Ordered in ED Medications - No data to display   Initial Impression / Assessment and Plan / ED Course  I have reviewed the triage vital signs and the nursing notes.  Pertinent labs & imaging results that were available during my care of the patient were reviewed by me and considered in my medical decision making (see chart for details).     Patient is medically cleared. He can be no evaluated at [facility].  Final Clinical Impressions(s) / ED Diagnoses   Final diagnoses:  Auditory hallucinations    New Prescriptions New Prescriptions   No medications on file     Donnetta Hutching, MD 04/02/17 2056

## 2017-04-02 NOTE — ED Provider Notes (Addendum)
Sleeping comfortably. Stable   Bruce [name], Shawnese Magner, MD 04/02/17 [phone] 1 12:20 PM patient is alert Glasgow Coma Score 15 ambulatory. Stable for discharge. [facility].. He has remote feelings of wine to harm himself but would not act out on it Results for orders placed or performed during the hospital encounter of 03/31/17  Comprehensive metabolic panel  Result Value Ref Range   Sodium 139 135 - 145 mmol/L   Potassium 4.5 3.5 - 5.1 mmol/L   Chloride 103 101 - 111 mmol/L   CO2 30 22 - 32 mmol/L   Glucose, Bld 91 65 - 99 mg/dL   BUN 15 6 - 20 mg/dL   Creatinine, Ser 1.470.99 0.61 - 1.24 mg/dL   Calcium 9.0 8.9 - 82.910.3 mg/dL   Total Protein 7.7 6.5 - 8.1 g/dL   Albumin 3.4 (L) 3.5 - 5.0 g/dL   AST 26 15 - 41 U/L   ALT 18 17 - 63 U/L   Alkaline Phosphatase 61 38 - 126 U/L   Total Bilirubin 0.6 0.3 - 1.2 mg/dL   GFR calc non Af Amer >60 >60 mL/min   GFR calc Af Amer >60 >60 mL/min   Anion gap 6 5 - 15  Ethanol  Result Value Ref Range   Alcohol, Ethyl (B) <5 <5 mg/dL  Salicylate level  Result Value Ref Range   Salicylate Lvl <7.0 2.8 - 30.0 mg/dL  Acetaminophen level  Result Value Ref Range   Acetaminophen (Tylenol), Serum <10 (L) 10 - 30 ug/mL  cbc  Result Value Ref Range   WBC 7.3 4.0 - 10.5 K/uL   RBC 4.11 (L) 4.22 - 5.81 MIL/uL   Hemoglobin 11.6 (L) 13.0 - 17.0 g/dL   HCT 56.236.0 (L) 13.039.0 - 86.552.0 %   MCV 87.6 78.0 - 100.0 fL   MCH 28.2 26.0 - 34.0 pg   MCHC 32.2 30.0 - 36.0 g/dL   RDW 78.414.3 69.611.5 - 29.515.5 %   Platelets 279 150 - 400 K/uL  Rapid urine drug screen (hospital performed)  Result Value Ref Range   Opiates NONE DETECTED NONE DETECTED   Cocaine POSITIVE (A) NONE DETECTED   Benzodiazepines NONE DETECTED NONE DETECTED   Amphetamines NONE DETECTED NONE DETECTED   Tetrahydrocannabinol NONE DETECTED NONE DETECTED   Barbiturates NONE DETECTED NONE DETECTED   No results found.   Bruce [name], Neesa Knapik, MD 04/02/17 [phone]

## 2017-04-02 NOTE — Discharge Instructions (Signed)
Patient has been medically cleared.Follow-up with [facility].

## 2017-04-02 NOTE — ED Notes (Signed)
Telepsych being performed. 

## 2017-08-02 ENCOUNTER — Emergency Department (HOSPITAL_COMMUNITY)
Admission: EM | Admit: 2017-08-02 | Discharge: 2017-08-03 | Disposition: A | Discharge status: Other Acute Inpatient Hospital | Payer: Medicaid Other | Attending: Emergency Medicine | Admitting: Emergency Medicine

## 2017-08-02 ENCOUNTER — Other Ambulatory Visit: Payer: Self-pay

## 2017-08-02 ENCOUNTER — Encounter (HOSPITAL_COMMUNITY): Payer: Self-pay

## 2017-08-02 DIAGNOSIS — F209 Schizophrenia, unspecified: Secondary | ICD-10-CM | POA: Diagnosis not present

## 2017-08-02 DIAGNOSIS — R441 Visual hallucinations: Secondary | ICD-10-CM | POA: Insufficient documentation

## 2017-08-02 DIAGNOSIS — R440 Auditory hallucinations: Secondary | ICD-10-CM | POA: Diagnosis not present

## 2017-08-02 DIAGNOSIS — R443 Hallucinations, unspecified: Secondary | ICD-10-CM

## 2017-08-02 DIAGNOSIS — F329 Major depressive disorder, single episode, unspecified: Secondary | ICD-10-CM | POA: Insufficient documentation

## 2017-08-02 DIAGNOSIS — Z79899 Other long term (current) drug therapy: Secondary | ICD-10-CM | POA: Insufficient documentation

## 2017-08-02 DIAGNOSIS — R45851 Suicidal ideations: Secondary | ICD-10-CM | POA: Insufficient documentation

## 2017-08-02 LAB — CBC
HCT: 39.3 % (ref 39.0–52.0)
HEMOGLOBIN: 12.6 g/dL — AB (ref 13.0–17.0)
MCH: 28.7 pg (ref 26.0–34.0)
MCHC: 32.1 g/dL (ref 30.0–36.0)
MCV: 89.5 fL (ref 78.0–100.0)
PLATELETS: 232 10*3/uL (ref 150–400)
RBC: 4.39 MIL/uL (ref 4.22–5.81)
RDW: 14.8 % (ref 11.5–15.5)
WBC: 7.8 10*3/uL (ref 4.0–10.5)

## 2017-08-02 LAB — COMPREHENSIVE METABOLIC PANEL
ALBUMIN: 3 g/dL — AB (ref 3.5–5.0)
ALT: 14 U/L — AB (ref 17–63)
AST: 16 U/L (ref 15–41)
Alkaline Phosphatase: 61 U/L (ref 38–126)
Anion gap: 7 (ref 5–15)
BUN: 11 mg/dL (ref 6–20)
CHLORIDE: 105 mmol/L (ref 101–111)
CO2: 26 mmol/L (ref 22–32)
CREATININE: 1.07 mg/dL (ref 0.61–1.24)
Calcium: 9 mg/dL (ref 8.9–10.3)
GFR calc Af Amer: >60 mL/min (ref >60)
GLUCOSE: 91 mg/dL (ref 65–99)
POTASSIUM: 3.9 mmol/L (ref 3.5–5.1)
SODIUM: 138 mmol/L (ref 135–145)
Total Bilirubin: 0.4 mg/dL (ref 0.3–1.2)
Total Protein: 7.2 g/dL (ref 6.5–8.1)

## 2017-08-02 LAB — RAPID URINE DRUG SCREEN, HOSP PERFORMED
AMPHETAMINES: NOT DETECTED
BENZODIAZEPINES: NOT DETECTED
Barbiturates: NOT DETECTED
Cocaine: POSITIVE — AB
Opiates: NOT DETECTED
TETRAHYDROCANNABINOL: NOT DETECTED

## 2017-08-02 LAB — SALICYLATE LEVEL: Salicylate Lvl: <7 mg/dL (ref 2.8–30.0)

## 2017-08-02 LAB — ETHANOL

## 2017-08-02 LAB — ACETAMINOPHEN LEVEL: Acetaminophen (Tylenol), Serum: <10 ug/mL — ABNORMAL LOW (ref 10–30)

## 2017-08-02 MED ORDER — BENZTROPINE MESYLATE 1 MG PO TABS
0.5000 mg | ORAL_TABLET | Freq: Two times a day (BID) | ORAL | Status: DC
  Filled 2017-08-02 (×2): qty 1

## 2017-08-02 MED ORDER — ACETAMINOPHEN 500 MG PO TABS: 500.0000 mg | ORAL_TABLET | Freq: Four times a day (QID) | ORAL | Status: DC | PRN

## 2017-08-02 MED ORDER — TRAZODONE HCL 50 MG PO TABS
150.0000 mg | ORAL_TABLET | Freq: Every evening | ORAL | Status: DC | PRN
  Administered 2017-08-02: 23:00:00 150 mg via ORAL
  Filled 2017-08-02: qty 1

## 2017-08-02 MED ORDER — BENZTROPINE MESYLATE 1 MG PO TABS
0.5000 mg | ORAL_TABLET | Freq: Two times a day (BID) | ORAL | Status: DC
  Administered 2017-08-03 (×2): 0.5 mg via ORAL

## 2017-08-02 MED ORDER — HYDROXYZINE HCL 25 MG PO TABS: 25.0000 mg | ORAL_TABLET | Freq: Four times a day (QID) | ORAL | Status: DC | PRN

## 2017-08-02 NOTE — ED Notes (Signed)
Pt wanded by security. 

## 2017-08-02 NOTE — ED Notes (Signed)
Pt changed into gown (pt too big for paper scrubs); Pt's belongings placed on locker #4 in [place] [facility]

## 2017-08-02 NOTE — ED Provider Notes (Signed)
[facility] Provider Note   CSN: [id number] Arrival date & time: 08/02/17  1129     History   Chief Complaint No chief complaint on file.   HPI Bruce [name] is a 41 y.o. male.  HPI    41 year old male presents today with suicidal, homicidal ideations and hallucinations.  Patient notes a significant past medical history of the same.  He reports that he hears demonic voices and sees demons.  He notes these demons tell him to kill himself and kill others.  Patient notes he has attempted suicide in the past by cutting his right wrist.  He notes he was attempting to jump off a bridge today when 1 of his friends stopped him.  Patient notes that last week he chewed up a bunch of crack because the edema and told him to "break your heart".  Patient reports that he receives Haldol monthly, but this does not improve his symptoms.  When asked to be wanted to hurt, he reports people that tease him. Patient denies any physical complaints here today.   Past Medical History:  Diagnosis Date  . Depression   . Obesity   . Schizophrenia [facility](HCC)     Patient Active Problem List   Diagnosis Date Noted  . 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 Polysubstance abuse (HCC) 02/27/2017  . Auditory hallucinations 02/27/2017  . Substance induced mood disorder (HCC) 02/27/2017  . Cocaine use disorder, severe, dependence (HCC) 01/05/2017  . 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 Cocaine use disorder, mild, abuse (HCC) 05/03/2016  . Cannabis use disorder, mild, abuse 05/03/2016  . Suicide ideation 04/30/2016  . Paranoid schizophrenia (HCC)   . Hyperprolactinemia (HCC) 09/24/2015  . Alcohol use disorder, moderate, dependence (HCC) 09/22/2015  . Morbid obesity (HCC) 09/22/2015    History reviewed. No pertinent surgical history.     Home Medications    Prior to Admission medications   Medication Sig Start Date End Date Taking?  Authorizing Provider  acetaminophen (TYLENOL) 500 MG tablet Take 500-1,000 mg by mouth every 6 (six) hours as needed for moderate pain (for pain or headaches).     [provider]  amLODipine (NORVASC) 5 MG tablet Take 5 mg by mouth daily.  01/08/17 01/08/18  [provider]  benztropine (COGENTIN) 0.5 MG tablet Take 1 tablet (0.5 mg total) by mouth 2 (two) times daily with a meal. Patient not taking: Reported on 03/16/2017 01/05/17   Adonis BrookAgustin, Sheila, NP  benztropine (COGENTIN) 0.5 MG tablet Take 1 tablet (0.5 mg total) by mouth 2 (two) times daily with a meal. 02/28/17   Charm RingsLord, Jamison Y, NP  hydrOXYzine (ATARAX/VISTARIL) 25 MG tablet Take 1 tablet (25 mg total) by mouth every 6 (six) hours as needed for anxiety. 01/05/17   Adonis BrookAgustin, Sheila, NP  hydrOXYzine (ATARAX/VISTARIL) 25 MG tablet Take 1 tablet (25 mg total) by mouth every 6 (six) hours as needed for anxiety. Patient not taking: Reported on 03/31/2017 02/28/17   Charm RingsLord, Jamison Y, NP  Paliperidone Palmitate (INVEGA SUSTENNA IM) Inject 150 mg into the muscle every 30 (thirty) days.     [provider]  traZODone (DESYREL) 150 MG tablet Take 1 tablet (150 mg total) by mouth at bedtime and may repeat dose one time if needed. 02/28/17   Charm RingsLord, Jamison Y, NP    Family History Family History  Problem Relation Age of Onset  . Schizophrenia Mother     Social History Social History   Tobacco Use  .  Smoking status: Never Smoker  . Smokeless tobacco: Never Used  Substance Use Topics  . Alcohol use: No  . Drug use: Yes    Types: Marijuana, "Crack" cocaine, Cocaine    Comment: "in the past"     Allergies   Patient has no known allergies.   Review of Systems Review of Systems  All other systems reviewed and are negative.  Physical Exam Updated Vital Signs BP 130/86   Pulse 90   Temp 98.2 F (36.8 C) (Oral)   Resp 18   SpO2 99%   Physical Exam  Constitutional: He is oriented to person, place, and time. He appears  well-developed and well-nourished.  HENT:  Head: Normocephalic and atraumatic.  Eyes: Conjunctivae are normal. Pupils are equal, round, and reactive to light. Right eye exhibits no discharge. Left eye exhibits no discharge. No scleral icterus.  Neck: Normal range of motion. No JVD present. No tracheal deviation present.  Cardiovascular: Normal rate and regular rhythm.  Pulmonary/Chest: Effort normal and breath sounds normal. No stridor. No respiratory distress. He has no wheezes. He has no rales. He exhibits no tenderness.  Neurological: He is alert and oriented to person, place, and time. Coordination normal.  Psychiatric: He has a normal mood and affect. His behavior is normal. Judgment and thought content normal.  Nursing note and vitals reviewed.    ED Treatments / Results  Labs (all labs ordered are listed, but only abnormal results are displayed) Labs Reviewed  COMPREHENSIVE METABOLIC PANEL - Abnormal; Notable for the following components:      Result Value   Albumin 3.0 (*)    ALT 14 (*)    All other components within normal limits  ACETAMINOPHEN LEVEL - Abnormal; Notable for the following components:   Acetaminophen (Tylenol), Serum <10 (*)    All other components within normal limits  CBC - Abnormal; Notable for the following components:   Hemoglobin 12.6 (*)    All other components within normal limits  RAPID URINE DRUG SCREEN, HOSP PERFORMED - Abnormal; Notable for the following components:   Cocaine POSITIVE (*)    All other components within normal limits  ETHANOL  SALICYLATE LEVEL    EKG  EKG Interpretation None       Radiology No results found.  Procedures Procedures (including critical care time)  Medications Ordered in ED Medications - No data to display   Initial Impression / Assessment and Plan / ED Course  I have reviewed the triage vital signs and the nursing notes.  Pertinent labs & imaging results that were available during my care of the  patient were reviewed by me and considered in my medical decision making (see chart for details).      Final Clinical Impressions(s) / ED Diagnoses   Final diagnoses:  Suicidal ideation  Hallucination   Labs: Rapid urine drug screen, CMP CBC, ethanol, acetaminophen, salicylate  Imaging:  Consults:  Therapeutics:  Discharge Meds:   Assessment/Plan: 41 year old male presents today with suicidal and homicidal ideations, also having visual and auditory hallucinations.  Patient in no acute distress, no significant physical complaints here today.  Patient is medically cleared awaiting TTS evaluation.    ED Discharge Orders    None       Rosalio LoudHedges, Verle Brillhart, PA-C 08/02/17 1302    Benjiman CorePickering, Nathan, MD 08/02/17 [phone]

## 2017-08-02 NOTE — BH Assessment (Signed)
Tele Assessment Note   Patient Name: Bruce [name] MRN: [id number] Referring Physician: Eyvonne MechanicJeffrey Hedges, PA-C Location of Patient: [facility] Location of Provider: Behavioral Health TTS Department  Bruce [name] is a 41 y.o. male who presents voluntarily to the ED with complaints of SI, HI, and AVH. Pt reports hearing demonic voices telling him to kill himself and others. Pt has no specific intended victim. Pt also reports seeing demons. Pt indicates that the demonic voices woke him up this morning and told him to jump off of a bridge and he went to do this. He reports having one foot over the bridge when a friend came out of his apartment and stopped pt. Pt [facility] but he says they don't believe him when he reports this to them. No other complaints.    Diagnosis: Schizoaffective d/o, depressed type  Past Medical History:  Past Medical History:  Diagnosis Date  . Depression   . Obesity   . Schizophrenia (HCC)     History reviewed. No pertinent surgical history.  Family History:  Family History  Problem Relation Age of Onset  . Schizophrenia Mother     Social History:  reports that  has never smoked. he has never used smokeless tobacco. He reports that he uses drugs. Drugs: Marijuana, "Crack" cocaine, and Cocaine. He reports that he does not drink alcohol.  Additional Social History:  Alcohol / Drug Use Pain Medications: see PTA meds  Prescriptions: see PTA meds Over the Counter: see PTA meds History of alcohol / drug use?: Yes(Pt tested positive for cocaine but minimizes his use. )  CIWA: CIWA-Ar BP: 130/86 Pulse Rate: 90 COWS:    PATIENT STRENGTHS: (choose at least two) Capable of independent living Communication skills Motivation for treatment/growth  Allergies: No Known Allergies  Home Medications:  (Not in a hospital admission)  OB/GYN Status:  No LMP for male patient.  General Assessment Data [facility] Assessment: In  system Is this a Tele or Face-to-Face Assessment?: Tele Assessment Is this an Initial Assessment or a Re-assessment for this encounter?: Initial Assessment Marital status: Single Living Arrangements: Non-relatives/Friends Can pt return to current living arrangement?: Yes Admission Status: Voluntary Is patient capable of signing voluntary admission?: Yes Referral Source: Self/Family/Friend Insurance type: Medicaid     Crisis Care Plan Living Arrangements: Non-relatives/Friends Name of Psychiatrist: [facility] Name of Therapist: [facility]  Education Status Is patient currently in school?: No  Risk to self with the past 6 months Suicidal Ideation: Yes-Currently Present Has patient been a risk to self within the past 6 months prior to admission? : Yes Suicidal Intent: Yes-Currently Present Has patient had any suicidal intent within the past 6 months prior to admission? : Yes Is patient at risk for suicide?: Yes Suicidal Plan?: Yes-Currently Present Has patient had any suicidal plan within the past 6 months prior to admission? : Yes Specify Current Suicidal Plan: jump off of bridge Access to Means: Yes Specify Access to Suicidal Means: bridges Previous Attempts/Gestures: Yes Triggers for Past Attempts: Hallucinations Intentional Self Injurious Behavior: None Family Suicide History: Unknown Persecutory voices/beliefs?: Yes Depression: Yes Substance abuse history and/or treatment for substance abuse?: Yes Suicide prevention information given to non-admitted patients: Not applicable  Risk to Others within the past 6 months Homicidal Ideation: No Does patient have any lifetime risk of violence toward others beyond the six months prior to admission? : No Thoughts of Harm to Others: No Current Homicidal Intent: No Current Homicidal  Plan: No Access to Homicidal Means: No History of harm to others?: No Assessment of Violence: None Noted Does patient have access to weapons?:  No Criminal Charges Pending?: Yes Describe Pending Criminal Charges: Misdemeanor larceny/conspiracy Does patient have a court date: Yes Court Date: 09/15/17 Is patient on probation?: Unknown  Psychosis Hallucinations: Auditory, With command, Visual Delusions: None noted  Mental Status Report Appearance/Hygiene: Unremarkable, In hospital gown Eye Contact: Good Motor Activity: Unremarkable Speech: Logical/coherent Level of Consciousness: Alert Mood: Pleasant, Euthymic Affect: Appropriate to circumstance Anxiety Level: Minimal Thought Processes: Coherent, Relevant Judgement: Impaired Orientation: Person, Place, Time, Situation Obsessive Compulsive Thoughts/Behaviors: None  Cognitive Functioning Concentration: Normal Memory: Recent Intact, Remote Intact IQ: Average Insight: Poor Impulse Control: Poor Appetite: Good Sleep: No Change Vegetative Symptoms: None  ADLScreening [facility] Assessment Services) Patient's cognitive ability adequate to safely complete daily activities?: Yes Patient able to express need for assistance with ADLs?: Yes Independently performs ADLs?: Yes (appropriate for developmental age)  Prior Inpatient Therapy Prior Inpatient Therapy: Yes  Prior Outpatient Therapy Prior Outpatient Therapy: Yes Does patient have an ACCT team?: Yes Does patient have Intensive In-House Services?  : No Does patient have [facility] services? : Yes Does patient have P4CC services?: No  ADL Screening (condition at time of admission) Patient's cognitive ability adequate to safely complete daily activities?: Yes Is the patient deaf or have difficulty hearing?: No Does the patient have difficulty seeing, even when wearing glasses/contacts?: No Does the patient have difficulty concentrating, remembering, or making decisions?: No Patient able to express need for assistance with ADLs?: Yes Does the patient have difficulty dressing or bathing?: No Independently performs ADLs?: Yes  (appropriate for developmental age) Does the patient have difficulty walking or climbing stairs?: No Weakness of Legs: None Weakness of Arms/Hands: None  Home Assistive Devices/Equipment Home Assistive Devices/Equipment: None    Abuse/Neglect Assessment (Assessment to be complete while patient is alone) Abuse/Neglect Assessment Can Be Completed: Yes Physical Abuse: Denies Verbal Abuse: Denies Sexual Abuse: Denies Exploitation of patient/patient's resources: Denies Self-Neglect: Denies Values / Beliefs Cultural Requests During Hospitalization: None Spiritual Requests During Hospitalization: None   Advance Directives (For Healthcare) Does Patient Have a Medical Advance Directive?: No Would patient like information on creating a medical advance directive?: No - Patient declined    Additional Information 1:1 In Past 12 Months?: No CIRT Risk: No Elopement Risk: No Does patient have medical clearance?: Yes     Disposition:  Disposition Initial Assessment Completed for this Encounter: Yes(consulted with Ferne ReusJustina Okonkwo, NP) Disposition of Patient: Inpatient treatment program Type of inpatient treatment program: Adult  This service was provided via telemedicine using a 2-way, interactive audio and video technology.  Names of all persons participating in this telemedicine service and their role in this encounter.   Laddie AquasSamantha M Keeanna Villafranca 08/02/2017 2:39 PM

## 2017-08-02 NOTE — ED Notes (Signed)
Dinner tray ordered.

## 2017-08-02 NOTE — ED Triage Notes (Signed)
Patient reports that he has suicidal ideations x 1 week. States that he takes haldol shot every month and hasn't missed one. [place] but friend stopped him, hx of same

## 2017-08-03 ENCOUNTER — Inpatient Hospital Stay (HOSPITAL_COMMUNITY)
Admission: AD | Admit: 2017-08-03 | Discharge: 2017-08-11 | DRG: 885 | Disposition: A | Discharge status: Home / Self Care | Payer: Medicaid Other | Source: Intra-hospital | Attending: Psychiatry | Admitting: Psychiatry

## 2017-08-03 ENCOUNTER — Other Ambulatory Visit: Payer: Self-pay

## 2017-08-03 ENCOUNTER — Encounter (HOSPITAL_COMMUNITY): Payer: Self-pay | Admitting: *Deleted

## 2017-08-03 DIAGNOSIS — F1410 Cocaine abuse, uncomplicated: Secondary | ICD-10-CM | POA: Diagnosis not present

## 2017-08-03 DIAGNOSIS — Z818 Family history of other mental and behavioral disorders: Secondary | ICD-10-CM | POA: Diagnosis not present

## 2017-08-03 DIAGNOSIS — Z6841 Body Mass Index (BMI) 40.0 and over, adult: Secondary | ICD-10-CM | POA: Diagnosis not present

## 2017-08-03 DIAGNOSIS — F251 Schizoaffective disorder, depressive type: Secondary | ICD-10-CM | POA: Diagnosis present

## 2017-08-03 DIAGNOSIS — E221 Hyperprolactinemia: Secondary | ICD-10-CM | POA: Diagnosis present

## 2017-08-03 DIAGNOSIS — Z79899 Other long term (current) drug therapy: Secondary | ICD-10-CM

## 2017-08-03 DIAGNOSIS — F1420 Cocaine dependence, uncomplicated: Secondary | ICD-10-CM | POA: Diagnosis present

## 2017-08-03 DIAGNOSIS — Z79891 Long term (current) use of opiate analgesic: Secondary | ICD-10-CM

## 2017-08-03 DIAGNOSIS — Z23 Encounter for immunization: Secondary | ICD-10-CM | POA: Diagnosis not present

## 2017-08-03 DIAGNOSIS — G4700 Insomnia, unspecified: Secondary | ICD-10-CM | POA: Diagnosis present

## 2017-08-03 DIAGNOSIS — R443 Hallucinations, unspecified: Secondary | ICD-10-CM | POA: Diagnosis not present

## 2017-08-03 DIAGNOSIS — I10 Essential (primary) hypertension: Secondary | ICD-10-CM | POA: Diagnosis present

## 2017-08-03 DIAGNOSIS — R45851 Suicidal ideations: Secondary | ICD-10-CM | POA: Diagnosis present

## 2017-08-03 DIAGNOSIS — R45850 Homicidal ideations: Secondary | ICD-10-CM | POA: Diagnosis present

## 2017-08-03 DIAGNOSIS — F1210 Cannabis abuse, uncomplicated: Secondary | ICD-10-CM | POA: Diagnosis not present

## 2017-08-03 DIAGNOSIS — R440 Auditory hallucinations: Secondary | ICD-10-CM | POA: Diagnosis not present

## 2017-08-03 DIAGNOSIS — Z915 Personal history of self-harm: Secondary | ICD-10-CM

## 2017-08-03 DIAGNOSIS — F1910 Other psychoactive substance abuse, uncomplicated: Secondary | ICD-10-CM | POA: Diagnosis present

## 2017-08-03 DIAGNOSIS — F419 Anxiety disorder, unspecified: Secondary | ICD-10-CM | POA: Diagnosis not present

## 2017-08-03 DIAGNOSIS — Z590 Homelessness: Secondary | ICD-10-CM | POA: Diagnosis not present

## 2017-08-03 DIAGNOSIS — R450 Nervousness: Secondary | ICD-10-CM | POA: Diagnosis not present

## 2017-08-03 MED ORDER — INFLUENZA VAC SPLIT QUAD 0.5 ML IM SUSY
0.5000 mL | PREFILLED_SYRINGE | INTRAMUSCULAR | Status: DC
  Filled 2017-08-03: qty 0.5

## 2017-08-03 NOTE — Progress Notes (Signed)
[name] is a 41 year old male pt admitted on voluntary basis. On admission, he reports that he has been hearing a demonic voice telling him to kill himself. He denies any SI on admission and is able to contract for safety while in the hospital. He reports that he has been taking his medications as prescribed and reports that [facility] and has [facility] team. He does endorse crack cocaine but denies any other substance abuse issue. He reports that the voices are bothering him and would like help with them. He reports that he is unsure of where he can go once he is discharged. He was oriented to the unit and safety maintained.

## 2017-08-03 NOTE — Progress Notes (Signed)
Per Bruce [name] Bruce [name] , Bruce [name] & [name], [facility] ; Accepting provider is [name], NP; Attending provider is Dr. [name]  Number for report is [phone].  Patient is tentatively accepted due to the bed not being ready, pending D/C (bed is still occupied at this time).   Bruce [name] Bruce [name], [facility] will coordinate [facility] once the bed is ready.    [name], RN notified and aware of bed pending.    [name] MSW, LCSWA CSW Disposition [phone]

## 2017-08-03 NOTE — Discharge Planning (Signed)
[facility] ; Accepting provider is [name], NP; Attending provider is Dr. [name].

## 2017-08-03 NOTE — Progress Notes (Signed)
[facility]   Please have [facility].   Carmie Kannerallie, RN notified.    Baldo DaubJolan Tonnette Zwiebel MSW, LCSWA CSW Disposition [phone]

## 2017-08-03 NOTE — ED Notes (Signed)
Pt up to shower, calm & cooperative. Sitter present.

## 2017-08-03 NOTE — ED Notes (Signed)
Pt provided with stuff to go shower with.

## 2017-08-03 NOTE — Tx Team (Signed)
Initial Treatment Plan 08/03/2017 10:45 PM Bruce Organwight [place] [id number]    PATIENT STRESSORS: Medication change or noncompliance Substance abuse   PATIENT STRENGTHS: Ability for insight Average or above average intelligence General fund of knowledge Motivation for treatment/growth   PATIENT IDENTIFIED PROBLEMS: Depression Suicidal thoughts Auditory hallucinations "Voices are bothering me"                     DISCHARGE CRITERIA:  Ability to meet basic life and health needs Improved stabilization in mood, thinking, and/or behavior Reduction of life-threatening or endangering symptoms to within safe limits Verbal commitment to aftercare and medication compliance  PRELIMINARY DISCHARGE PLAN: Attend aftercare/continuing care group Placement in alternative living arrangements  PATIENT/FAMILY INVOLVEMENT: This treatment plan has been presented to and reviewed with the patient, Bruce OrganDwight [name], and/or family member, .  The patient and family have been given the opportunity to ask questions and make suggestions.  Tovia Kisner, [place], [place] 08/03/2017, 10:45 PM

## 2017-08-04 DIAGNOSIS — Z818 Family history of other mental and behavioral disorders: Secondary | ICD-10-CM

## 2017-08-04 DIAGNOSIS — F1410 Cocaine abuse, uncomplicated: Secondary | ICD-10-CM

## 2017-08-04 DIAGNOSIS — R45850 Homicidal ideations: Secondary | ICD-10-CM

## 2017-08-04 DIAGNOSIS — G4700 Insomnia, unspecified: Secondary | ICD-10-CM

## 2017-08-04 DIAGNOSIS — R440 Auditory hallucinations: Secondary | ICD-10-CM

## 2017-08-04 DIAGNOSIS — F1210 Cannabis abuse, uncomplicated: Secondary | ICD-10-CM

## 2017-08-04 DIAGNOSIS — R45851 Suicidal ideations: Secondary | ICD-10-CM

## 2017-08-04 DIAGNOSIS — F251 Schizoaffective disorder, depressive type: Principal | ICD-10-CM

## 2017-08-04 MED ORDER — HYDROXYZINE HCL 25 MG PO TABS
25.0000 mg | ORAL_TABLET | Freq: Four times a day (QID) | ORAL | Status: DC | PRN
  Administered 2017-08-05: 25 mg via ORAL

## 2017-08-04 MED ORDER — PALIPERIDONE PALMITATE 234 MG/1.5ML IM SUSP
234.0000 mg | Freq: Once | INTRAMUSCULAR | Status: AC
  Administered 2017-08-05: 234 mg via INTRAMUSCULAR
  Filled 2017-08-04: qty 1.5

## 2017-08-04 MED ORDER — PALIPERIDONE PALMITATE 156 MG/ML IM SUSP: 156.0000 mg | INTRAMUSCULAR | Status: DC

## 2017-08-04 MED ORDER — ALUM & MAG HYDROXIDE-SIMETH 200-200-20 MG/5ML PO SUSP
30.0000 mL | ORAL | Status: DC | PRN
  Administered 2017-08-05 – 2017-08-06 (×3): 30 mL via ORAL
  Filled 2017-08-04 (×3): qty 30

## 2017-08-04 MED ORDER — AMLODIPINE BESYLATE 5 MG PO TABS
5.0000 mg | ORAL_TABLET | Freq: Every day | ORAL | Status: DC
  Administered 2017-08-04 – 2017-08-11 (×8): 5 mg via ORAL
  Filled 2017-08-04 (×11): qty 1

## 2017-08-04 MED ORDER — PALIPERIDONE ER 6 MG PO TB24
6.0000 mg | ORAL_TABLET | Freq: Every day | ORAL | Status: DC
  Administered 2017-08-04 – 2017-08-07 (×4): 6 mg via ORAL
  Filled 2017-08-04 (×6): qty 1

## 2017-08-04 MED ORDER — ACETAMINOPHEN 325 MG PO TABS
650.0000 mg | ORAL_TABLET | Freq: Four times a day (QID) | ORAL | Status: DC | PRN
  Administered 2017-08-07 – 2017-08-08 (×2): 650 mg via ORAL
  Filled 2017-08-04 (×2): qty 2

## 2017-08-04 MED ORDER — TRAZODONE HCL 150 MG PO TABS
150.0000 mg | ORAL_TABLET | Freq: Every evening | ORAL | Status: DC | PRN
  Administered 2017-08-04 (×2): 150 mg via ORAL
  Filled 2017-08-04 (×5): qty 1

## 2017-08-04 MED ORDER — HALOPERIDOL 5 MG PO TABS
5.0000 mg | ORAL_TABLET | Freq: Two times a day (BID) | ORAL | Status: DC
  Filled 2017-08-04 (×2): qty 1

## 2017-08-04 MED ORDER — BENZTROPINE MESYLATE 0.5 MG PO TABS
0.5000 mg | ORAL_TABLET | Freq: Two times a day (BID) | ORAL | Status: DC
  Administered 2017-08-04 – 2017-08-11 (×15): 0.5 mg via ORAL
  Filled 2017-08-04 (×21): qty 1

## 2017-08-04 NOTE — Progress Notes (Signed)
Patient has been isolative to room this shift.  Patient has been compliant with meds.  Patient was noted to be flat and forwarded little during conversation.  Patient reports SI and AVH.  Patient has had no incidents of behavioral dyscontrol.   Assess patient for safety, offer medications as prescribed, engage patient in 1:1 staff talks.   Patient able to contract for safety. Continue to monitor as prescribed.    

## 2017-08-04 NOTE — Progress Notes (Signed)
Patient rated his day a 3. Patient's goal for today was to be less depressed. Patient stated he did not meet his goal. Staff advised him to come up with 10 coping skills for depression.

## 2017-08-04 NOTE — [facility] Suicide Risk Assessment (Signed)
[facility]   Nursing information obtained from:    Demographic factors:    Current Mental Status:    Loss Factors:    Historical Factors:    Risk Reduction Factors:     Total Time spent with patient: 45 minutes Principal Problem: Schizoaffective disorder, depressive type (HCC) Diagnosis:   Patient Active Problem List   Diagnosis Date Noted  . Schizoaffective disorder, depressive type (HCC) [F25.1] 08/04/2017  . 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 Polysubstance abuse (HCC) [F19.10] 02/27/2017  . Auditory hallucinations [R44.0] 02/27/2017  . Substance induced mood disorder (HCC) [F19.94] 02/27/2017  . Cocaine use disorder, severe, dependence (HCC) [F14.20] 01/05/2017  . Cocaine-induced mood disorder (HCC) [F14.94] 01/05/2017  . Cocaine use disorder, mild, abuse (HCC) [F14.10] 05/03/2016  . Cannabis use disorder, mild, abuse [F12.10] 05/03/2016  . Suicide ideation [R45.851] 04/30/2016  . Paranoid schizophrenia (HCC) [F20.0]   . 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 Alcohol use disorder, moderate, dependence (HCC) [F10.20] 09/22/2015  . Morbid obesity (HCC) [E66.01] 09/22/2015   Subjective Data:  Bruce [name] is a 41 y/o M with history of schizoaffective depressive type who was admitted voluntarily with worsening depression, AH of demonic voice commanding him to hurt himself and others, SI with plan to jump from a bridge, poor medication adherence, and illicit substance use of cocaine.  Upon evaluation, pt shares his reasons for presenting to the ED, stating, "I have been hearing voices and seeing demons since my 20's, and recently I've been feeling like the whole world is against me. I was thinking about jumping off a bridge, but a buddy stepped out of the car and helped me." Pt endorses worsening mood symptoms for several months which coincides with worsening depression, anhedonia, guilty feelings, low energy, and poor  concentration. He denies HI. He endorses AH and VH of a "demonic voice" which commands him to kill himself and/or to hurt others. Pt denies other symptoms of mania, OCD, and PTSD. He cites stressor of homelessness and struggling with ongoing substance use. Pt shares that he has been staying in "a trap house" and despite trying to resist, he recently "ate a bunch of crack." Pt was unable to specify his recent substance use further.  Discussed with patient about treatment options. [facility] notes [facility] but he has not followed with them for at least 2 months. He previously was trialed on [place]vega Sustenna (initially helpful but efficacy waned after a few months) and haldol decanoate (pt reports it was never effective). Pt is unable to name other previous medications that he has tried. Discussed with patient about option of restarting on Invega oral with plan to transition to long-acting injectable form and he was in agreement with that plan. Pt had no further questions, comments, or concerns.    Continued Clinical Symptoms:  Alcohol Use Disorder Identification Test Final Score (AUDIT): 1 The "Alcohol Use Disorders Identification Test", Guidelines for Use in Primary Care, Second Edition.  World Science writerHealth Organization [facility](WHO). Score between 0-7:  no or low risk or alcohol related problems. Score between 8-15:  moderate risk of alcohol related problems. Score between 16-19:  high risk of alcohol related problems. Score 20 or above:  warrants further diagnostic evaluation for alcohol dependence and treatment.   CLINICAL FACTORS:   Severe Anxiety and/or Agitation Alcohol/Substance Abuse/Dependencies Schizophrenia:   Command hallucinatons Paranoid or undifferentiated type More than one psychiatric diagnosis Unstable or Poor Therapeutic Relationship Previous Psychiatric  Diagnoses and Treatments   Musculoskeletal: Strength & Muscle Tone: within normal limits Gait &  Station: normal Patient leans: N/A  Psychiatric Specialty Exam: Physical Exam  Nursing note and vitals reviewed.   Review of Systems  Constitutional: Negative for chills and fever.  Respiratory: Negative for cough and shortness of breath.   Cardiovascular: Negative for chest pain.  Gastrointestinal: Negative for abdominal pain, heartburn, nausea and vomiting.  Psychiatric/Behavioral: Positive for depression, hallucinations, substance abuse and suicidal ideas. The patient is nervous/anxious.     Blood pressure 121/87, pulse 72, temperature 98.8 F (37.1 C), temperature source Oral, resp. rate 20, height 5\' 8"  (1.727 m), weight (!) 172.8 kg (381 lb).Body mass index is 57.93 kg/m.  General Appearance: Casual and Disheveled  Eye Contact:  Good  Speech:  Clear and Coherent and Normal Rate  Volume:  Normal  Mood:  Anxious  Affect:  Congruent and Flat  Thought Process:  Coherent, Goal Directed and Descriptions of Associations: Loose  Orientation:  Full (Time, Place, and Person)  Thought Content:  Logical and Hallucinations: Auditory Visual  Suicidal Thoughts:  Yes.  with intent/plan  Homicidal Thoughts:  No  Memory:  Immediate;   Good Recent;   Good Remote;   Good  Judgement:  Impaired  Insight:  Lacking  Psychomotor Activity:  Normal  Concentration:  Concentration: Good  Recall:  Good  Fund of Knowledge:  Fair  Language:  Fair  Akathisia:  No  Handed:    AIMS (if indicated):     Assets:  Manufacturing systems engineerCommunication Skills Physical Health Resilience Social Support  ADL's:  Intact  Cognition:  WNL  Sleep:  Number of Hours: 6      COGNITIVE FEATURES THAT CONTRIBUTE TO RISK:  None    SUICIDE RISK:   Mild:  Suicidal ideation of limited frequency, intensity, duration, and specificity.  There are no identifiable plans, no associated intent, mild dysphoria and related symptoms, good self-control (both objective and subjective assessment), few other risk factors, and identifiable  protective factors, including available and accessible social support.  PLAN OF CARE:   -Admit to inpatient level of care  -Schizophrenia  - Start invega 6mg  po qDay  - Start [place]vega Sustenna 234mg  IM once (tomorrow 08/05/17, and plan to give booster injection in 7 days)  - HTN  - Restart norvasc 5mg  qDay -EPS  - Start cogentin 0.5mg  BID - Anxiety  - Start atarax 25mg  po q6h prn anxiety -Insomnia  - Start trazodone 150mg  po qhs (may repeat x1 PRN)  -Encourage participation in groups and the therapeutic milieu -Discharge planning will be ongoing   I certify that inpatient services furnished can reasonably be expected to improve the patient's condition.   Micheal Likenshristopher T Leyani Gargus, MD 08/04/2017, 5:10 PM

## 2017-08-04 NOTE — H&P (Signed)
Psychiatric Admission Assessment Adult  Patient Identification: Bruce [name]  MRN:  [id number]  Date of Evaluation:  08/04/2017  Chief Complaint:  "I am hearing demonic voices telling me to kill myself and others."   Principal Diagnosis: Schizophrenia, paranoia-type.  Diagnosis:   Patient Active Problem List   Diagnosis Date Noted  . Paranoid schizophrenia ([place]) [F20.0]     Priority: High  . Schizoaffective disorder, depressive type ([place]) [F25.1] 08/04/2017  . 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 Polysubstance abuse ([place]) [F19.10] 02/27/2017  . Auditory hallucinations [R44.0] 02/27/2017  . Substance induced mood disorder ([place]) [F19.94] 02/27/2017  . Cocaine use disorder, severe, dependence ([place]) [F14.20] 01/05/2017  . 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 Cocaine use disorder, mild, abuse ([place]) [F14.10] 05/03/2016  . Cannabis use disorder, mild, abuse [F12.10] 05/03/2016  . Suicide ideation [R45.851] 04/30/2016  . Hyperprolactinemia ([place]) [E22.1] 09/24/2015  . Alcohol use disorder, moderate, dependence ([place]) [F10.20] 09/22/2015  . Morbid obesity ([place]) [E66.01] 09/22/2015   History of Present Illness: This is one of several admission assessment in this hospital alone for Bruce [name],  a 41 year old African-American male with hx of chronic mental illness. He is known in this hospital from previous admissions for mood stabilization treatments. Admitted to [facility] this time around from the [facility] with complaints of auditory hallucinations (demonic voices) telling him to kill himself & others. He has hx of multiple suicide attempts by different means. He has an Act Team who is over seeing his mental health care on an outpatient basis.    During this assessment, Bruce [name] reports, "I went to the [facility] ED on Tuesday, 2 days ago because I was feeling suicidal & attempted to jump off a bridge to kill myself. I have been feeling  suicidal since my 20's, but it worsened in the last 2 weeks. I have been taking my medicines. I think the medicines are no longer working because my body is immune to them. I keep hearing demonic voices telling me to kill myself & other people. I have attempted suicide x 10; by overdose, cutting my writs & jumping off a bridge.  I have been stressed out, feeling worthless for a while. Things have been working badly against me. I have trouble finding a job & a place to live. I have been homeless x 7 years. I have been depressed for a long time. The [place] system have tried to treat my depression, but it was not working. I have been on Sertraline, Risperdal & haldol, but they did not help my symptoms. Today, my depression at #10 & anxiety #9.  Associated Signs/Symptoms:  Depression Symptoms:  depressed mood, anhedonia, insomnia, suicidal thoughts without plan, loss of energy/fatigue, low self esteem  (Hypo) Manic Symptoms:   Denies   Anxiety Symptoms:  States he has " a lot of anxiety, I  worry a lot "  Psychotic Symptoms:   Auditory hallucinations.  States he has also been seeing " shadows " at times .   PTSD Symptoms: None reported  Total Time spent with patient: 1 hour  Past Psychiatric History: States he has had several psychiatric admissions, states that has been at [facility]. States he has a long history of psychotic symptoms, which come and go.  States he has hallucinations even at times when he is not feeling particularly depressed. States he has attempted suicide by overdosing in the past. Denies history of violence. At this time does not endorse  any clear history of mania or hypomania. Has been diagnosed with Schizophrenia in the past. Had been following up with his Act Team.   Risk to Self: Is patient at risk for suicide?: Yes Risk to Others: Yes, but without plan or intent Prior Inpatient Therapy: Yes Prior Outpatient Therapy: Yes  Alcohol Screening: 1. How often do you  have a drink containing alcohol?: Monthly or less 2. How many drinks containing alcohol do you have on a typical day when you are drinking?: 1 or 2 3. How often do you have six or more drinks on one occasion?: Never AUDIT-C Score: 1 4. How often during the last year have you found that you were not able to stop drinking once you had started?: Never 5. How often during the last year have you failed to do what was normally expected from you becasue of drinking?: Never 6. How often during the last year have you needed a first drink in the morning to get yourself going after a heavy drinking session?: Never 7. How often during the last year have you had a feeling of guilt of remorse after drinking?: Never 8. How often during the last year have you been unable to remember what happened the night before because you had been drinking?: Never 9. Have you or someone else been injured as a result of your drinking?: No 10. Has a relative or friend or a doctor or another health worker been concerned about your drinking or suggested you cut down?: No Alcohol Use Disorder Identification Test Final Score (AUDIT): 1 Intervention/Follow-up: AUDIT Score <7 follow-up not indicated  Substance Abuse History in the last 12 months:   Has a history of alcohol dependence & had been drinking more than a case of beer daily, but has not drank in several months.  History of cannabis abuse.   Consequences of Substance Abuse: Reports history of alcohol related black out.  Previous Psychotropic Medications:  Patient remembers having been on Seroquel, Risperidone, Prozac, Trazodone- as noted.  Psychological Evaluations:  No  Past Medical History: Denies any medical illnesses, NKDA, does not smoke   Past Medical History:  Diagnosis Date  . Depression   . Obesity   . Schizophrenia ([place])    History reviewed. No pertinent surgical history.  Family History:  Mother has schizophrenia and is now in an ALF , no contact with  father since he was a child, no siblings.  Family History  Problem Relation Age of Onset  . Schizophrenia Mother    Family Psychiatric  History: Schizophrenia: Mother & maternal aunt.  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 Usually stays at shelters  Social History   Substance and Sexual Activity  Alcohol Use No     Social History   Substance and Sexual Activity  Drug Use Yes  . Types: Marijuana, "Crack" cocaine, Cocaine   Comment: "in the past"    Social History   Socioeconomic History  . Marital status: Single    Spouse name: None  . Number of children: None  . Years of education: None  . Highest education level: None  Social Needs  . Financial resource strain: None  . Food insecurity - worry: None  . Food insecurity - inability: None  . Transportation needs - medical: None  . Transportation needs - non-medical: None  Occupational History  . None  Tobacco Use  . Smoking status: Never  Smoker  . Smokeless tobacco: Never Used  Substance and Sexual Activity  . Alcohol use: No  . Drug use: Yes    Types: Marijuana, "Crack" cocaine, Cocaine    Comment: "in the past"  . Sexual activity: Not Currently  Other Topics Concern  . None  Social History Narrative  . None   Additional Social History: Single, no children, unemployed, homeless.   Allergies:  No Known Allergies  Lab Results:  Results for orders placed or performed during the hospital encounter of 08/02/17 (from the past 48 hour(s))  Comprehensive metabolic panel     Status: Abnormal   Collection Time: 08/02/17 11:42 AM  Result Value Ref Range   Sodium 138 135 - 145 mmol/L   Potassium 3.9 3.5 - 5.1 mmol/L   Chloride 105 101 - 111 mmol/L   CO2 26 22 - 32 mmol/L   Glucose, Bld 91 65 - 99 mg/dL   BUN 11 6 - 20 mg/dL   Creatinine, Ser 1.07 0.61 - 1.24 mg/dL   Calcium 9.0 8.9 - 10.3 mg/dL   Total Protein  7.2 6.5 - 8.1 g/dL   Albumin 3.0 (L) 3.5 - 5.0 g/dL   AST 16 15 - 41 U/L   ALT 14 (L) 17 - 63 U/L   Alkaline Phosphatase 61 38 - 126 U/L   Total Bilirubin 0.4 0.3 - 1.2 mg/dL   GFR calc non Af Amer >60 >60 mL/min   GFR calc Af Amer >60 >60 mL/min    Comment: (NOTE) The eGFR has been calculated using the CKD EPI equation. This calculation has not been validated in all clinical situations. eGFR's persistently <60 mL/min signify possible Chronic Kidney Disease.    Anion gap 7 5 - 15  Ethanol     Status: None   Collection Time: 08/02/17 11:42 AM  Result Value Ref Range   Alcohol, Ethyl (B) <10 <10 mg/dL    Comment:        LOWEST DETECTABLE LIMIT FOR SERUM ALCOHOL IS 10 mg/dL FOR MEDICAL PURPOSES ONLY   Salicylate level     Status: None   Collection Time: 08/02/17 11:42 AM  Result Value Ref Range   Salicylate Lvl <6.9 2.8 - 30.0 mg/dL  Acetaminophen level     Status: Abnormal   Collection Time: 08/02/17 11:42 AM  Result Value Ref Range   Acetaminophen (Tylenol), Serum <10 (L) 10 - 30 ug/mL    Comment:        THERAPEUTIC CONCENTRATIONS VARY SIGNIFICANTLY. A RANGE OF 10-30 ug/mL MAY BE AN EFFECTIVE CONCENTRATION FOR MANY PATIENTS. HOWEVER, SOME ARE BEST TREATED AT CONCENTRATIONS OUTSIDE THIS RANGE. ACETAMINOPHEN CONCENTRATIONS >150 ug/mL AT 4 HOURS AFTER INGESTION AND >50 ug/mL AT 12 HOURS AFTER INGESTION ARE OFTEN ASSOCIATED WITH TOXIC REACTIONS.   cbc     Status: Abnormal   Collection Time: 08/02/17 11:42 AM  Result Value Ref Range   WBC 7.8 4.0 - 10.5 K/uL   RBC 4.39 4.22 - 5.81 MIL/uL   Hemoglobin 12.6 (L) 13.0 - 17.0 g/dL   HCT 39.3 39.0 - 52.0 %   MCV 89.5 78.0 - 100.0 fL   MCH 28.7 26.0 - 34.0 pg   MCHC 32.1 30.0 - 36.0 g/dL   RDW 14.8 11.5 - 15.5 %   Platelets 232 150 - 400 K/uL  Rapid urine drug screen (hospital performed)     Status: Abnormal   Collection Time: 08/02/17 12:05 PM  Result Value Ref Range   Opiates NONE DETECTED NONE  DETECTED   Cocaine  POSITIVE (A) NONE DETECTED   Benzodiazepines NONE DETECTED NONE DETECTED   Amphetamines NONE DETECTED NONE DETECTED   Tetrahydrocannabinol NONE DETECTED NONE DETECTED   Barbiturates NONE DETECTED NONE DETECTED    Comment:        DRUG SCREEN FOR MEDICAL PURPOSES ONLY.  IF CONFIRMATION IS NEEDED FOR ANY PURPOSE, NOTIFY LAB WITHIN 5 DAYS.        LOWEST DETECTABLE LIMITS FOR URINE DRUG SCREEN Drug Class       Cutoff (ng/mL) Amphetamine      1000 Barbiturate      200 Benzodiazepine   620 Tricyclics       355 Opiates          300 Cocaine          300 THC              50    Metabolic Disorder Labs:  Lab Results  Component Value Date   HGBA1C 5.2 10/12/2016   MPG 103 10/12/2016   MPG 108 09/26/2015   Lab Results  Component Value Date   PROLACTIN 63.9 (H) 10/12/2016   PROLACTIN 19.9 (H) 05/06/2016   Lab Results  Component Value Date   CHOL 143 10/12/2016   TRIG 70 10/12/2016   HDL 41 10/12/2016   CHOLHDL 3.5 10/12/2016   VLDL 14 10/12/2016   LDLCALC 88 10/12/2016   LDLCALC 103 (H) 09/26/2015   Current Medications: Current Facility-Administered Medications  Medication Dose Route Frequency Provider Last Rate Last Dose  . acetaminophen (TYLENOL) tablet 650 mg  650 mg Oral Q6H PRN Rankin, Shuvon B, NP      . alum & mag hydroxide-simeth (MAALOX/MYLANTA) 200-200-20 MG/5ML suspension 30 mL  30 mL Oral Q4H PRN Rankin, Shuvon B, NP      . benztropine (COGENTIN) tablet 0.5 mg  0.5 mg Oral BID WC Rankin, Shuvon B, NP      . hydrOXYzine (ATARAX/VISTARIL) tablet 25 mg  25 mg Oral Q6H PRN Rankin, Shuvon B, NP      . Influenza vac split quadrivalent PF (FLUARIX) injection 0.5 mL  0.5 mL Intramuscular Tomorrow-1000 Pennelope Bracken, MD      . traZODone (DESYREL) tablet 150 mg  150 mg Oral QHS,MR X 1 Rankin, Shuvon B, NP   150 mg at 08/04/17 0039   PTA Medications: Medications Prior to Admission  Medication Sig Dispense Refill Last Dose  . acetaminophen (TYLENOL) 500 MG tablet  Take 500-1,000 mg by mouth every 6 (six) hours as needed for moderate pain (for pain or headaches).    Not Taking at Unknown time  . amLODipine (NORVASC) 5 MG tablet Take 5 mg by mouth daily.    Not Taking at Unknown time  . benztropine (COGENTIN) 0.5 MG tablet Take 1 tablet (0.5 mg total) by mouth 2 (two) times daily with a meal. (Patient not taking: Reported on 08/02/2017) 60 tablet 0 Not Taking at Unknown time  . hydrOXYzine (ATARAX/VISTARIL) 25 MG tablet Take 1 tablet (25 mg total) by mouth every 6 (six) hours as needed for anxiety. (Patient not taking: Reported on 03/31/2017) 30 tablet 0 Not Taking at Unknown time  . Paliperidone Palmitate (INVEGA SUSTENNA IM) Inject 150 mg into the muscle every 30 (thirty) days.    Not Taking at Unknown time  . traZODone (DESYREL) 150 MG tablet Take 1 tablet (150 mg total) by mouth at bedtime and may repeat dose one time if needed. (Patient not taking: Reported on 08/02/2017)  30 tablet 0 Not Taking at Unknown time   Musculoskeletal: Strength & Muscle Tone: within normal limits Gait & Station: normal Patient leans: N/A  Psychiatric Specialty Exam: Physical Exam  Constitutional: He is oriented to person, place, and time. He appears well-developed and well-nourished.  HENT:  Head: Normocephalic.  Eyes: Pupils are equal, round, and reactive to light.  Neck: Normal range of motion.  Cardiovascular: Normal rate.  Respiratory: Effort normal.  GI: Soft.  Genitourinary:  Genitourinary Comments: Denies any issues in this area  Musculoskeletal: Normal range of motion.  Neurological: He is alert and oriented to person, place, and time.  Skin: Skin is warm and dry.  Psychiatric: His speech is normal. His mood appears anxious. His affect is not angry, not blunt, not labile and not inappropriate. He is actively hallucinating. Thought content is paranoid. He expresses impulsivity. He exhibits a depressed mood.    Review of Systems  Constitutional: Negative.    HENT: Negative.   Eyes: Negative.   Respiratory: Negative.   Cardiovascular: Negative.   Gastrointestinal: Negative.   Genitourinary: Negative.   Musculoskeletal: Negative.   Skin: Negative.   Neurological: Negative.   Endo/Heme/Allergies: Negative.   Psychiatric/Behavioral: Positive for depression, hallucinations (Auditory), substance abuse (Hx Alcohol dependence) and suicidal ideas. Negative for memory loss. The patient is nervous/anxious and has insomnia.   All other systems reviewed and are negative.   Blood pressure 121/87, pulse 72, temperature 98.8 F (37.1 C), temperature source Oral, resp. rate 20, height _0  (1.727 m), weight (!) 172.8 kg (381 lb).Body mass index is 57.93 kg/m.  General Appearance: Fairly Groomed, Obese, calm  Eye Contact::  Good  Speech:  Normal Rate  Volume:  Decreased  Mood:  Depressed  Affect:  Non-Congruent  Thought Process:  Coherent, Goal Directed and Descriptions of Associations: Intact  Orientation:  Other:  fully alert and attentive   Thought Content:  Reports auditory hallucinations, no delusions expressed, does not appear internally preoccupied at this time  Suicidal Thoughts:  Yes.  without intent/plan, able to contract for safety.  Homicidal Thoughts:  Yes.  without intent/plan states auditory hallucinations have told him to " hurt people"  , but denies any plans or intentions of HI or violence .   Memory:  Grossly intact  Judgement:  Fair  Insight:  Fair  Psychomotor Activity:  Decreased- No agitation or restlessness, calm   Concentration:  Good  Recall:  [place] of Knowledge:Good  Language: Good  Akathisia:  Negative  Handed:  Right  AIMS (if indicated):     Assets:  Desire for Improvement Resilience  ADL's:   Fair   Cognition: WNL  Sleep:  Number of Hours: 6   Treatment Plan/Recommendations: 1. Admit for crisis management and stabilization, estimated length of stay 5-7 days.   2. Medication management to reduce current  symptoms to base line and improve the patient's overall level of functioning: See MAR. Md's SRA & treatment plan.  Observation Level/Precautions:  15 minute checks  Laboratory:  Per ED  Psychotherapy:  Milieu, support , groups   Medications: Prozac 20 mg for depression, Prolixin 5 mg for mood control, Cogentin 0.5 mg for EPS, Trazodone 100 mg for insomnia.  Consultations:  As needed   Discharge Concerns:  Homelessness, chronic mental illness  Estimated LOS: 5-7 days  Other: Patient will receive treatment for mood control   Physician Treatment Plan for Primary Diagnosis: Schizophrenia, paranoia-type.  Long Term Goal(s): Improvement in symptoms so as ready for  discharge  Short Term Goals: Ability to identify changes in lifestyle to reduce recurrence of condition will improve, Ability to verbalize feelings will improve and Ability to demonstrate self-control will improve  Physician Treatment Plan for Secondary Diagnosis: Active Problems:   Schizophrenia, Paranoia-type ([place])  Long Term Goal(s): Improvement in symptoms so as ready for discharge  Short Term Goals: Ability to identify and develop effective coping behaviors will improve, Compliance with prescribed medications will improve and Ability to identify triggers associated with substance abuse/mental health issues will improve  I certify that inpatient services furnished can reasonably be expected to improve the patient's condition.    Encarnacion Slates, PMHNP, FNP-C 12/13/201811:04 AM  I have reviewed NP's Note, assessement, diagnosis and plan, and agree. I have also met with patient and completed suicide risk assessment.   Bruce [name] is a 41 y/o M with history of schizoaffective depressive type who was admitted voluntarily with worsening depression, AH of demonic voice commanding him to hurt himself and others, SI with plan to jump from a bridge, poor medication adherence, and illicit substance use of cocaine.  Upon evaluation,  pt shares his reasons for presenting to the ED, stating, "I have been hearing voices and seeing demons since my 20's, and recently I've been feeling like the whole world is against me. I was thinking about jumping off a bridge, but a buddy stepped out of the car and helped me." Pt endorses worsening mood symptoms for several months which coincides with worsening depression, anhedonia, guilty feelings, low energy, and poor concentration. He denies HI. He endorses AH and VH of a "demonic voice" which commands him to kill himself and/or to hurt others. Pt denies other symptoms of mania, OCD, and PTSD. He cites stressor of homelessness and struggling with ongoing substance use. Pt shares that he has been staying in "a trap house" and despite trying to resist, he recently "ate a bunch of crack." Pt was unable to specify his recent substance use further.  Discussed with patient about treatment options. He notes that he previously followed up with [facility] ACT team, but he has not followed with them for at least 2 months. He previously was trialed on [place] (initially helpful but efficacy waned after a few months) and haldol decanoate (pt reports it was never effective). Pt is unable to name other previous medications that he has tried. Discussed with patient about option of restarting on Invega oral with plan to transition to long-acting injectable form and he was in agreement with that plan. Pt had no further questions, comments, or concerns.   PLAN OF CARE:   -Admit to inpatient level of care  -Schizophrenia             - Start invega 37m po qDay             - Start ILorayne BenderSustenna 2311mIM once (tomorrow 08/05/17, and plan to give booster injection in 7 days)  - HTN             - Restart norvasc 70m57mDay -EPS             - Start cogentin 0.70mg68mD - Anxiety             - Start atarax 270mg56mq6h prn anxiety -Insomnia             - Start trazodone 150mg 41mhs (may repeat x1  PRN)  -Encourage participation in groups and the therapeutic milieu -Discharge planning  will be ongoing     Maris Berger, MD
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
## 2017-08-04 NOTE — Progress Notes (Signed)
Patient ID: Bruce OrganDwight [name], male   DOB: 05-12-1976, 41 y.o.   MRN: [id number]  Pt currently presents with a flat affect and behavior. Pt interacts minimally with peers and staff. Affect brightens during interaction. Pt states "I'm okay." Pt reports good sleep with current medication regimen. Repeat refused.   Pt provided with medications per providers orders. Pt's labs and vitals were monitored throughout the night. Pt given a 1:1 about emotional and mental status. Pt supported and encouraged to express concerns and questions. Pt educated on medications.  Pt's safety ensured with 15 minute and environmental checks. Pt endorses passive SI and HI. Denies any plan to harm himself or others [facility]. Also endorses ongoing auditory hallucinations with mild ability to frighten. Reports he uses distraction during periods of hallucinations. Expresses hope that the scheduled injection for tomorrow "will help" lessen his AH. Will continue POC.

## 2017-08-04 NOTE — [facility] Suicide Risk Assessment (Signed)
[facility] INPATIENT:  Family/Significant Other Suicide Prevention Education  Suicide Prevention Education:  Patient Refusal for Family/Significant Other Suicide Prevention Education: The patient Bruce [name] has refused to provide written consent for family/significant other to be provided Family/Significant Other Suicide Prevention Education during admission and/or prior to discharge.  Physician notified.  Bruce [name] 08/04/2017, 5:25 PM

## 2017-08-04 NOTE — Progress Notes (Signed)
Recreation Therapy Notes  Date: 08/04/17 Time: 1000 Location: 500 Hall  Group Topic: Teambuilding  Goal Area(s) Addresses:  Patient will effectively work with peers in group.  Patient will verbalize benefit of healthy teambuilding. Patient will verbalize positive effect of healthy team building post d/c.  Patient will identify teambuilding techniques that made activity effective for group.   Intervention: Interior and spatial designerubber disks  Activity: [place].  Each patient was given a rubber disk and one extra disk for the group.  Patient were to maneuver each patient from one end of the hallway to the other using only the disks provided.  If any person stepped off their disk, the group would have to start over.  Education: Communication, Discharge Planning  Education Outcome: Acknowledges understanding/In group clarification offered/Needs additional education.   Clinical Observations/Feedback:  Pt did not attend group.    Caroll RancherMarjette Adrian Dinovo, LRT/CTRS         Caroll RancherLindsay, Cas Tracz A 08/04/2017 11:44 AM

## 2017-08-04 NOTE — Progress Notes (Signed)
Recreation Therapy Notes  INPATIENT RECREATION THERAPY ASSESSMENT  Patient Details Name: Bruce [name] MRN: [id number] DOB: [birth year] Today's Date: 08/04/2017  Patient Stressors: Other (Comment)(People)  Pt stated he was here for hearing voices and seeing demons telling him to kill himself and others.  Coping Skills:   Arguments, Substance Abuse, Avoidance, Self-Injury, Sports  Personal Challenges: Concentration, Decision-Making, Expressing Yourself, Problem-Solving, Self-Esteem/Confidence, Social Interaction, Stress Management, Substance Abuse, Trusting Others, Work Nutritional therapisterformance  Leisure Interests (2+):  Individual - TV  Awareness of Community Resources:  No  Patient Strengths:  Get along with people  Patient Identified Areas of Improvement:  Be less stressful  Current Recreation Participation:  Everyday  Patient Goal for Hospitalization:  "Get voices to where I can function"  [place]  [place]  Current SI (including self-harm):  Yes(Rated 10 out of 10; contracts for safety)  Current HI:  Yes(Rated 10 out of 10; contracts for [employer])  Consent to Intern Participation: N/A    Bruce [name], LRT/CTRS  Bruce RancherLindsay, Brittanny Levenhagen A 08/04/2017, 12:59 PM

## 2017-08-04 NOTE — Tx Team (Signed)
Interdisciplinary Treatment and Diagnostic Plan Update  08/04/2017 Time of Session: 3:19 PM  Bruce [name] MRN: [id number]  Principal Diagnosis: Schizoaffective disorder, depressive type ([place])  Secondary Diagnoses: Active Problems:   Paranoid schizophrenia ([place])   Schizoaffective disorder, depressive type ([place])   Current Medications:  Current Facility-Administered Medications  Medication Dose Route Frequency Provider Last Rate Last Dose  . acetaminophen (TYLENOL) tablet 650 mg  650 mg Oral Q6H PRN Rankin, Shuvon B, NP      . alum & mag hydroxide-simeth (MAALOX/MYLANTA) 200-200-20 MG/5ML suspension 30 mL  30 mL Oral Q4H PRN Rankin, Shuvon B, NP      . amLODipine (NORVASC) tablet 5 mg  5 mg Oral Daily Nwoko, Agnes I, NP   5 mg at 08/04/17 1153  . benztropine (COGENTIN) tablet 0.5 mg  0.5 mg Oral BID WC Rankin, Shuvon B, NP   0.5 mg at 08/04/17 1153  . hydrOXYzine (ATARAX/VISTARIL) tablet 25 mg  25 mg Oral Q6H PRN Rankin, Shuvon B, NP      . Influenza vac split quadrivalent PF (FLUARIX) injection 0.5 mL  0.5 mL Intramuscular Tomorrow-1000 Pennelope Bracken, MD      . Derrill Memo ON 08/05/2017] paliperidone (INVEGA SUSTENNA) injection 234 mg  234 mg Intramuscular Once Pennelope Bracken, MD       And  . Derrill Memo ON 08/12/2017] paliperidone (INVEGA SUSTENNA) injection 156 mg  156 mg Intramuscular Q30 days Maris Berger T, MD      . paliperidone (INVEGA) 24 hr tablet 6 mg  6 mg Oral Daily Pennelope Bracken, MD      . traZODone (DESYREL) tablet 150 mg  150 mg Oral QHS,MR X 1 Rankin, Shuvon B, NP   150 mg at 08/04/17 0039    PTA Medications: Medications Prior to Admission  Medication Sig Dispense Refill Last Dose  . acetaminophen (TYLENOL) 500 MG tablet Take 500-1,000 mg by mouth every 6 (six) hours as needed for moderate pain (for pain or headaches).    Not Taking at Unknown time  . amLODipine (NORVASC) 5 MG tablet Take 5 mg by mouth daily.    Not Taking at Unknown  time  . benztropine (COGENTIN) 0.5 MG tablet Take 1 tablet (0.5 mg total) by mouth 2 (two) times daily with a meal. (Patient not taking: Reported on 08/02/2017) 60 tablet 0 Not Taking at Unknown time  . hydrOXYzine (ATARAX/VISTARIL) 25 MG tablet Take 1 tablet (25 mg total) by mouth every 6 (six) hours as needed for anxiety. (Patient not taking: Reported on 03/31/2017) 30 tablet 0 Not Taking at Unknown time  . Paliperidone Palmitate (INVEGA SUSTENNA IM) Inject 150 mg into the muscle every 30 (thirty) days.    Not Taking at Unknown time  . traZODone (DESYREL) 150 MG tablet Take 1 tablet (150 mg total) by mouth at bedtime and may repeat dose one time if needed. (Patient not taking: Reported on 08/02/2017) 30 tablet 0 Not Taking at Unknown time    Patient Stressors: Medication change or noncompliance Substance abuse  Patient Strengths: Ability for insight Average or above average intelligence General fund of knowledge Motivation for treatment/growth  Treatment Modalities: Medication Management, Group therapy, Case management,  1 to 1 session with clinician, Psychoeducation, Recreational therapy.   Physician Treatment Plan for Primary Diagnosis: Schizoaffective disorder, depressive type ([place]) Long Term Goal(s): Improvement in symptoms so as ready for discharge  Short Term Goals:    Medication Management: Evaluate patient's response, side effects, and tolerance of medication regimen.  Therapeutic  Interventions: 1 to 1 sessions, Unit Group sessions and Medication administration.  Evaluation of Outcomes: Progressing  Physician Treatment Plan for Secondary Diagnosis: Active Problems:   Paranoid schizophrenia ([place])   Schizoaffective disorder, depressive type ([place])   Long Term Goal(s): Improvement in symptoms so as ready for discharge  Short Term Goals:    Medication Management: Evaluate patient's response, side effects, and tolerance of medication regimen.  Therapeutic Interventions: 1  to 1 sessions, Unit Group sessions and Medication administration.  Evaluation of Outcomes: Progressing   RN Treatment Plan for Primary Diagnosis: Schizoaffective disorder, depressive type ([place]) Long Term Goal(s): Knowledge of disease and therapeutic regimen to maintain health will improve  Short Term Goals: Ability to identify and develop effective coping behaviors will improve and Compliance with prescribed medications will improve  Medication Management: RN will administer medications as ordered by provider, will assess and evaluate patient's response and provide education to patient for prescribed medication. RN will report any adverse and/or side effects to prescribing provider.  Therapeutic Interventions: 1 on 1 counseling sessions, Psychoeducation, Medication administration, Evaluate responses to treatment, Monitor vital signs and CBGs as ordered, Perform/monitor CIWA, COWS, AIMS and Fall Risk screenings as ordered, Perform wound care treatments as ordered.  Evaluation of Outcomes: Progressing   LCSW Treatment Plan for Primary Diagnosis: Schizoaffective disorder, depressive type ([place]) Long Term Goal(s): Safe transition to appropriate next level of care at discharge, Engage patient in therapeutic group addressing interpersonal concerns.  Short Term Goals: Engage patient in aftercare planning with referrals and resources  Therapeutic Interventions: Assess for all discharge needs, 1 to 1 time with Social worker, Explore available resources and support systems, Assess for adequacy in community support network, Educate family and significant other(s) on suicide prevention, Complete Psychosocial Assessment, Interpersonal group therapy.  Evaluation of Outcomes: Met  Get into [facility], follow up at [facility] to TCT   Progress in Treatment: Attending groups: Yes Participating in groups: Yes Taking medication as prescribed: Yes Toleration medication: Yes, no side effects  reported at this time Family/Significant other contact made: No Patient understands diagnosis: Yes AEB asking for help with mental health symptoms Discussing patient identified problems/goals with staff: Yes Medical problems stabilized or resolved: Yes Denies suicidal/homicidal ideation: Yes Issues/concerns per patient self-inventory: None Other: N/A  New problem(s) identified: None identified at this time.   New Short Term/Long Term Goal(s):"Voices are bothering me"   Discharge Plan or Barriers:   Reason for Continuation of Hospitalization:  Depression Hallucinations  Medication stabilization Suicidal ideation   Estimated Length of Stay: 12/18  Attendees: Patient: Bruce [name] 08/04/2017  3:19 PM  Physician: Maris Berger, MD 08/04/2017  3:19 PM  Nursing: Sena Hitch, RN 08/04/2017  3:19 PM  RN Care Manager: Lars Pinks, RN 08/04/2017  3:19 PM  Social Worker: Ripley Fraise 08/04/2017  3:19 PM  Recreational Therapist: Winfield Cunas 08/04/2017  3:19 PM  Other: Norberto Sorenson 08/04/2017  3:19 PM  Other:  08/04/2017  3:19 PM    Scribe for Treatment Team:  Roque Lias LCSW 08/04/2017 3:19 PM

## 2017-08-05 LAB — LIPID PANEL
CHOL/HDL RATIO: 3.7 ratio
CHOLESTEROL: 148 mg/dL (ref 0–200)
HDL: 40 mg/dL — AB (ref >40)
LDL Cholesterol: 93 mg/dL (ref 0–99)
TRIGLYCERIDES: 73 mg/dL (ref <150)
VLDL: 15 mg/dL (ref 0–40)

## 2017-08-05 MED ORDER — HYDROXYZINE HCL 50 MG PO TABS
50.0000 mg | ORAL_TABLET | Freq: Four times a day (QID) | ORAL | Status: DC | PRN
  Administered 2017-08-05 – 2017-08-10 (×6): 50 mg via ORAL
  Filled 2017-08-05 (×5): qty 1

## 2017-08-05 MED ORDER — RISPERIDONE 2 MG PO TBDP
2.0000 mg | ORAL_TABLET | Freq: Three times a day (TID) | ORAL | Status: DC | PRN
  Administered 2017-08-05 – 2017-08-06 (×3): 2 mg via ORAL
  Filled 2017-08-05: qty 2
  Filled 2017-08-05: qty 4
  Filled 2017-08-05: qty 2

## 2017-08-05 NOTE — Plan of Care (Signed)
  Activity: Interest or engagement in leisure activities will improve 08/05/2017 2122 - Not Progressing by Delos HaringPhillips, Dason Mosley A, RN Note Pt stayed in room majority of evening   Safety: Periods of time without injury will increase 08/05/2017 2122 - Progressing by Delos HaringPhillips, Alecea Trego A, RN Note Pt safe on the unit at this time

## 2017-08-05 NOTE — Progress Notes (Signed)
[facility]  08/05/2017 1:34 PM Bruce OrganDwight Profit  MRN:  [id number] Subjective:    Bruce [name] is a 41 y/o M with history of schizoaffective depressive type who was admitted voluntarily with worsening depression, AH of demonic voice commanding him to hurt himself and others, SI with plan to jump from a bridge, poor medication adherence, and illicit substance use of cocaine. Pt was restarted on Invega oral formulation with plan to transition to [place]ust[place].  Upon evaluation today, pt reports that he is still struggling with depression, hallucinations, and SI. He shares, "The voices are real bad. They are telling me to kill others." Pt is able to contract for safety while on the unit. He endorses VH of "seeing demons on the lady with meds." He endorses some thoughts of SI without plan, explaining, "It's just thoughts because of the voices but I don't think I'll do anything in here." He denies HI. He reports that his sleep was poor as he had difficulty staying asleep last night. He is tolerating his medication without difficulty or side effects, and he is in agreement to continue the plan to start on [place] Sustenna today.    Principal Problem: Schizoaffective disorder, depressive type (HCC) Diagnosis:   Patient Active Problem List   Diagnosis Date Noted  . Schizoaffective disorder, depressive type (HCC) [F25.1] 08/04/2017  . 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 Polysubstance abuse (HCC) [F19.10] 02/27/2017  . Auditory hallucinations [R44.0] 02/27/2017  . Substance induced mood disorder (HCC) [F19.94] 02/27/2017  . Cocaine use disorder, severe, dependence (HCC) [F14.20] 01/05/2017  . Cocaine-induced mood disorder (HCC) [F14.94] 01/05/2017  . Cocaine use disorder, mild, abuse (HCC) [F14.10] 05/03/2016  . Cannabis use disorder, mild, abuse [F12.10] 05/03/2016  . Suicide ideation [R45.851] 04/30/2016  . Paranoid schizophrenia (HCC) [F20.0]   . Hyperprolactinemia  (HCC) [E22.1] 09/24/2015  . Alcohol use disorder, moderate, dependence (HCC) [F10.20] 09/22/2015  . Morbid obesity (HCC) [E66.01] 09/22/2015   Total Time spent with patient: 30 minutes  Past Psychiatric History: see H&P  Past Medical History:  Past Medical History:  Diagnosis Date  . Depression   . Obesity   . Schizophrenia (HCC)    History reviewed. No pertinent surgical history. Family History:  Family History  Problem Relation Age of Onset  . Schizophrenia Mother    Family Psychiatric  History: see H&P Social History:  Social History   Substance and Sexual Activity  Alcohol Use No     Social History   Substance and Sexual Activity  Drug Use Yes  . Types: Marijuana, "Crack" cocaine, Cocaine   Comment: "in the past"    Social History   Socioeconomic History  . Marital status: Single    Spouse name: None  . Number of children: None  . Years of education: None  . Highest education level: None  Social Needs  . Financial resource strain: None  . Food insecurity - worry: None  . Food insecurity - inability: None  . Transportation needs - medical: None  . Transportation needs - non-medical: None  Occupational History  . None  Tobacco Use  . Smoking status: Never Smoker  . Smokeless tobacco: Never Used  Substance and Sexual Activity  . Alcohol use: No  . Drug use: Yes    Types: Marijuana, "Crack" cocaine, Cocaine    Comment: "in the past"  . Sexual activity: Not Currently  Other Topics Concern  . None  Social History Narrative  . None   Additional Social History:  Sleep: Poor  Appetite:  Good  Current Medications: Current Facility-Administered Medications  Medication Dose Route Frequency Provider Last Rate Last Dose  . acetaminophen (TYLENOL) tablet 650 mg  650 mg Oral Q6H PRN Rankin, Shuvon B, NP      . alum & mag hydroxide-simeth (MAALOX/MYLANTA) 200-200-20 MG/5ML suspension 30 mL  30 mL Oral Q4H PRN Rankin,  Shuvon B, NP   30 mL at 08/05/17 1159  . amLODipine (NORVASC) tablet 5 mg  5 mg Oral Daily Armandina Stammer I, NP   5 mg at 08/05/17 0750  . benztropine (COGENTIN) tablet 0.5 mg  0.5 mg Oral BID WC Rankin, Shuvon B, NP   0.5 mg at 08/05/17 0753  . hydrOXYzine (ATARAX/VISTARIL) tablet 50 mg  50 mg Oral Q6H PRN Micheal Likens, MD      . Influenza vac split quadrivalent PF (FLUARIX) injection 0.5 mL  0.5 mL Intramuscular Tomorrow-1000 Micheal Likens, MD      . Melene Muller ON 08/12/2017] paliperidone (INVEGA SUSTENNA) injection 156 mg  156 mg Intramuscular Q30 days Jolyne Loa T, MD      . paliperidone (INVEGA) 24 hr tablet 6 mg  6 mg Oral Daily Micheal Likens, MD   6 mg at 08/05/17 0751  . risperiDONE (RISPERDAL M-TABS) disintegrating tablet 2 mg  2 mg Oral Q8H PRN Micheal Likens, MD   2 mg at 08/05/17 1100    Lab Results:  Results for orders placed or performed during the hospital encounter of 08/03/17 (from the past 48 hour(s))  Lipid panel     Status: Abnormal   Collection Time: 08/05/17  6:44 AM  Result Value Ref Range   Cholesterol 148 0 - 200 mg/dL   Triglycerides 73 <161 mg/dL   HDL 40 (L) >09 mg/dL   Total CHOL/HDL Ratio 3.7 RATIO   VLDL 15 0 - 40 mg/dL   LDL Cholesterol 93 0 - 99 mg/dL    Comment:        Total Cholesterol/HDL:CHD Risk Coronary Heart Disease Risk Table                     Men   Women  1/2 Average Risk   3.4   3.3  Average Risk       5.0   4.4  2 X Average Risk   9.6   7.1  3 X Average Risk  23.4   11.0        Use the calculated Patient Ratio above and the CHD Risk Table to determine the patient's CHD Risk.        ATP III CLASSIFICATION (LDL):  <100     mg/dL   Optimal  604-540  mg/dL   Near or Above                    Optimal  130-159  mg/dL   Borderline  981-191  mg/dL   High  >478     mg/dL   Very High Performed at [facility], [address], [place], [place] [address]     Blood Alcohol level:  Lab  Results  Component Value Date   [facility] <10 08/02/2017   ETH <5 04/02/2017    Metabolic Disorder Labs: Lab Results  Component Value Date   HGBA1C 5.2 10/12/2016   MPG 103 10/12/2016   MPG 108 09/26/2015   Lab Results  Component Value Date   PROLACTIN 63.9 (H) 10/12/2016   PROLACTIN 19.9 (  H) 05/06/2016   Lab Results  Component Value Date   CHOL 148 08/05/2017   TRIG 73 08/05/2017   HDL 40 (L) 08/05/2017   CHOLHDL 3.7 08/05/2017   VLDL 15 08/05/2017   LDLCALC 93 08/05/2017   LDLCALC 88 10/12/2016    Physical Findings: AIMS: Facial and Oral Movements Muscles of Facial Expression: None, normal Lips and Perioral Area: None, normal Jaw: None, normal Tongue: None, normal,Extremity Movements Upper (arms, wrists, hands, fingers): None, normal Lower (legs, knees, ankles, toes): None, normal, Trunk Movements Neck, shoulders, hips: None, normal, Overall Severity Severity of abnormal movements (highest score from questions above): None, normal Incapacitation due to abnormal movements: None, normal Patient's awareness of abnormal movements (rate only patient's report): No Awareness, Dental Status Current problems with teeth and/or dentures?: No Does patient usually wear dentures?: No  CIWA:    COWS:     Musculoskeletal: Strength & Muscle Tone: within normal limits Gait & Station: normal Patient leans: N/A  Psychiatric Specialty Exam: Physical Exam  Nursing note and vitals reviewed.   Review of Systems  Constitutional: Negative for chills and fever.  HENT: Negative for hearing loss.   Respiratory: Negative for cough and hemoptysis.   Cardiovascular: Negative for chest pain.  Gastrointestinal: Negative for abdominal pain, heartburn, nausea and vomiting.  Psychiatric/Behavioral: Positive for depression, hallucinations and suicidal ideas. The patient is nervous/anxious and has insomnia.     Blood pressure 117/62, pulse 83, temperature 98.6 F (37 C), temperature source Oral,  resp. rate 18, height 5\' 8"  (1.727 m), weight (!) 172.8 kg (381 lb).Body mass index is 57.93 kg/m.  General Appearance: Casual and Fairly Groomed  Eye Contact:  Good  Speech:  Clear and Coherent and Slow  Volume:  Normal  Mood:  Anxious and Depressed  Affect:  Appropriate, Congruent and Flat  Thought Process:  Coherent, Goal Directed and Descriptions of Associations: Loose  Orientation:  Full (Time, Place, and Person)  Thought Content:  Hallucinations: Auditory Command:  to hurt others Visual and Paranoid Ideation  Suicidal Thoughts:  Yes.  without intent/plan  Homicidal Thoughts:  No  Memory:  Immediate;   Good Recent;   Good Remote;   Good  Judgement:  Fair  Insight:  Fair  Psychomotor Activity:  Normal  Concentration:  Concentration: Fair  Recall:  Good  Fund of Knowledge:  Good  Language:  Good  Akathisia:  No  Handed:    AIMS (if indicated):     Assets:  Communication Skills Leisure Time Physical Health Resilience  ADL's:  Intact  Cognition:  WNL  Sleep:  Number of Hours: 4.25     Treatment Plan Summary: Daily contact with patient to assess and evaluate symptoms and progress in treatment and Medication management. Pt reports ongoing AH, VH, SI, anxiety, and insomnia. He agrees to start on [place] today and we will increase dose of atarax to address insomnia as he feels trazodone is not helpful. He would also like a  PRN medication for as needed treatment of AH and anxiety, and he agrees to trial of riserpdal M-Tab in addition to starting [place] sustenna today.   -Continue inpatient hospitalization  -Schizophrenia             - Continue invega 6mg  po qDay             - Start Hinda Glatter Sustenna 234mg  IM once (give today 08/05/17, and plan to give booster injection in 7 days +/- 4 days)    - Start Risperdal  M-Tab 2mg  po q8h prn psychosis/agitation  - HTN             - Continue norvasc 5mg  qDay -EPS             - Continue cogentin 0.5mg  BID - Anxiety              - Change atarax 25mg  po q6h prn anxiety to atarax 50mg  po q6h prn anxiety or insomnia -Insomnia             - discontinue trazodone (pt will use vistaril)   -Encourage participation in groups and the therapeutic milieu -Discharge planning will be ongoing   Micheal Likenshristopher T Makinsley Schiavi, MD 08/05/2017, 1:34 PM

## 2017-08-05 NOTE — Progress Notes (Signed)
Bruce [name] is OOB UAL on the 500 hall today...tolerated fair. HE is quiet and isolative to his room. HE avoids making eye contact and is shy and guarded. HE admits tothis Clinical research associatewriter that his auditory hallucinations are " hard to deal with" when he first got OOB this morning. Per his request, he received 25 mg po vistaril ( " for the voices" and then was given 2 mg Risperdal m tab at 1100 " fot the voices". He received his Invega IM sustena 234mg  per MD order, without adverse reaction. A He completed his daily assessment and on this he wrote he has experienced SI today but he contracted with nursing to not hurt himself. HE rated his depression, hopelessness and anxeity " 8/9/7". He has attended his meals in the cafe. HE remains quiet, guarded, suspicious and contracting for safety. R Safety is maintained.

## 2017-08-05 NOTE — Progress Notes (Signed)
D: Pt SI/ AH. Pt is pleasant and cooperative. Pt kept to himself most of the evening, pt came out for snack and complained of hearing voices and was given PRN Risperdal per MAR .   A: Pt was offered support and encouragement. Pt was given scheduled medications. Pt was encourage to attend groups. Q 15 minute checks were done for safety.   R: Pt is taking medication. Pt has no complaints.Pt receptive to treatment and safety maintained on unit.

## 2017-08-05 NOTE — Progress Notes (Signed)
Recreation Therapy Notes  Date: 08/05/14 Time: 1000 Location: 500 Hall Dayroom  Group Topic: Wellness  Goal Area(s) Addresses:  Patient will define components of whole wellness. Patient will verbalize benefit of whole wellness.  Behavioral Response: Engaged  Intervention:  Chairs, small beach ball  Activity: Keep It ContractorGoing Volleyball.  Patients were seated in a circle in the dayroom.  Patients were to pass the ball back and forth to each other.  Patients could bounce the balls off the floor but the ball could not come to a complete stop.  LRT counted the number of hits the patients made on the ball.  If the ball came to a stop, the count would start over.    Education: Wellness, Building control surveyorDischarge Planning.   Education Outcome: Acknowledges education/In group clarification offered/Needs additional education.   Clinical Observations/Feedback: Pt sat outside the circle but participated in activity.  Pt was quiet but active.    Caroll RancherMarjette Mikell Camp, LRT/CTRS     Lillia AbedLindsay, Lai Hendriks A 08/05/2017 11:14 AM

## 2017-08-06 DIAGNOSIS — R450 Nervousness: Secondary | ICD-10-CM

## 2017-08-06 DIAGNOSIS — F419 Anxiety disorder, unspecified: Secondary | ICD-10-CM

## 2017-08-06 DIAGNOSIS — I10 Essential (primary) hypertension: Secondary | ICD-10-CM

## 2017-08-06 DIAGNOSIS — R443 Hallucinations, unspecified: Secondary | ICD-10-CM

## 2017-08-06 NOTE — Progress Notes (Signed)
[facility]  08/06/2017 2:48 PM Arlan OrganDwight Selway  MRN:  [id number]  Subjective:  [name] reports " I am not doing so good today, I want my own room because I am feeling homicidal toward my roommate."  Objective: Arlan OrganDwight Herrington is awake, alert. Seen resting in dayroom.Denies suicidal ideation however reports homicidal ideation toward roommate. Denies auditory or visual hallucination and does not appear to be responding to internal stimuli. Discussed case with charge nurse and nursing staff.  Keedan to stay in dayroom during the day and to sleep in quite room for resting and sleep. Patient is able to contract for safely and reports " I am okay I will not do anything to him, I don't want to change room, I just want my own room." Patient reports he is medication compliant and states he is tolerating medications well. Support, encouragement and reassurance was provided.    Principal Problem: Schizoaffective disorder, depressive type (HCC) Diagnosis:   Patient Active Problem List   Diagnosis Date Noted  . Schizoaffective disorder, depressive type (HCC) [F25.1] 08/04/2017  . 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 Polysubstance abuse (HCC) [F19.10] 02/27/2017  . Auditory hallucinations [R44.0] 02/27/2017  . Substance induced mood disorder (HCC) [F19.94] 02/27/2017  . Cocaine use disorder, severe, dependence (HCC) [F14.20] 01/05/2017  . Cocaine-induced mood disorder (HCC) [F14.94] 01/05/2017  . Cocaine use disorder, mild, abuse (HCC) [F14.10] 05/03/2016  . Cannabis use disorder, mild, abuse [F12.10] 05/03/2016  . Suicide ideation [R45.851] 04/30/2016  . Paranoid schizophrenia (HCC) [F20.0]   . 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 Alcohol use disorder, moderate, dependence (HCC) [F10.20] 09/22/2015  . Morbid obesity (HCC) [E66.01] 09/22/2015   Total Time spent with patient: 30 minutes  Past Psychiatric History: see H&P  Past Medical History:  Past  Medical History:  Diagnosis Date  . Depression   . Obesity   . Schizophrenia (HCC)    History reviewed. No pertinent surgical history. Family History:  Family History  Problem Relation Age of Onset  . Schizophrenia Mother    Family Psychiatric  History: see H&P Social History:  Social History   Substance and Sexual Activity  Alcohol Use No     Social History   Substance and Sexual Activity  Drug Use Yes  . Types: Marijuana, "Crack" cocaine, Cocaine   Comment: "in the past"    Social History   Socioeconomic History  . Marital status: Single    Spouse name: None  . Number of children: None  . Years of education: None  . Highest education level: None  Social Needs  . Financial resource strain: None  . Food insecurity - worry: None  . Food insecurity - inability: None  . Transportation needs - medical: None  . Transportation needs - non-medical: None  Occupational History  . None  Tobacco Use  . Smoking status: Never Smoker  . Smokeless tobacco: Never Used  Substance and Sexual Activity  . Alcohol use: No  . Drug use: Yes    Types: Marijuana, "Crack" cocaine, Cocaine    Comment: "in the past"  . Sexual activity: Not Currently  Other Topics Concern  . None  Social History Narrative  . None   Additional Social History:                         Sleep: Poor  Appetite:  Good  Current Medications: Current Facility-Administered Medications  Medication Dose Route Frequency Provider Last Rate Last Dose  .  acetaminophen (TYLENOL) tablet 650 mg  650 mg Oral Q6H PRN Rankin, Shuvon B, NP      . alum & mag hydroxide-simeth (MAALOX/MYLANTA) 200-200-20 MG/5ML suspension 30 mL  30 mL Oral Q4H PRN Rankin, Shuvon B, NP   30 mL at 08/06/17 0349  . amLODipine (NORVASC) tablet 5 mg  5 mg Oral Daily Armandina Stammer I, NP   5 mg at 08/06/17 0933  . benztropine (COGENTIN) tablet 0.5 mg  0.5 mg Oral BID WC Rankin, Shuvon B, NP   0.5 mg at 08/06/17 0933  . hydrOXYzine  (ATARAX/VISTARIL) tablet 50 mg  50 mg Oral Q6H PRN Micheal Likens, MD   50 mg at 08/05/17 2104  . Influenza vac split quadrivalent PF (FLUARIX) injection 0.5 mL  0.5 mL Intramuscular Tomorrow-1000 Micheal Likens, MD      . Melene Muller ON 08/12/2017] paliperidone (INVEGA SUSTENNA) injection 156 mg  156 mg Intramuscular Q30 days Jolyne Loa T, MD      . paliperidone (INVEGA) 24 hr tablet 6 mg  6 mg Oral Daily Micheal Likens, MD   6 mg at 08/06/17 0933  . risperiDONE (RISPERDAL M-TABS) disintegrating tablet 2 mg  2 mg Oral Q8H PRN Micheal Likens, MD   2 mg at 08/06/17 4098    Lab Results:  Results for orders placed or performed during the hospital encounter of 08/03/17 (from the past 48 hour(s))  Lipid panel     Status: Abnormal   Collection Time: 08/05/17  6:44 AM  Result Value Ref Range   Cholesterol 148 0 - 200 mg/dL   Triglycerides 73 <119 mg/dL   HDL 40 (L) >14 mg/dL   Total CHOL/HDL Ratio 3.7 RATIO   VLDL 15 0 - 40 mg/dL   LDL Cholesterol 93 0 - 99 mg/dL    Comment:        Total Cholesterol/HDL:CHD Risk Coronary Heart Disease Risk Table                     Men   Women  1/2 Average Risk   3.4   3.3  Average Risk       5.0   4.4  2 X Average Risk   9.6   7.1  3 X Average Risk  23.4   11.0        Use the calculated Patient Ratio above and the CHD Risk Table to determine the patient's CHD Risk.        ATP III CLASSIFICATION (LDL):  <100     mg/dL   Optimal  782-956  mg/dL   Near or Above                    Optimal  130-159  mg/dL   Borderline  213-086  mg/dL   High  >578     mg/dL   Very High Performed at [facility], [address], [place], [place] [address]     Blood Alcohol level:  Lab Results  Component Value Date   [facility] <10 08/02/2017   ETH <5 04/02/2017    Metabolic Disorder Labs: Lab Results  Component Value Date   HGBA1C 5.2 10/12/2016   MPG 103 10/12/2016   MPG 108 09/26/2015   Lab Results  Component  Value Date   PROLACTIN 63.9 (H) 10/12/2016   PROLACTIN 19.9 (H) 05/06/2016   Lab Results  Component Value Date   CHOL 148 08/05/2017   TRIG 73 08/05/2017  HDL 40 (L) 08/05/2017   CHOLHDL 3.7 08/05/2017   VLDL 15 08/05/2017   LDLCALC 93 08/05/2017   LDLCALC 88 10/12/2016    Physical Findings: AIMS: Facial and Oral Movements Muscles of Facial Expression: None, normal Lips and Perioral Area: None, normal Jaw: None, normal Tongue: None, normal,Extremity Movements Upper (arms, wrists, hands, fingers): None, normal Lower (legs, knees, ankles, toes): None, normal, Trunk Movements Neck, shoulders, hips: None, normal, Overall Severity Severity of abnormal movements (highest score from questions above): None, normal Incapacitation due to abnormal movements: None, normal Patient's awareness of abnormal movements (rate only patient's report): No Awareness, Dental Status Current problems with teeth and/or dentures?: No Does patient usually wear dentures?: No  CIWA:    COWS:     Musculoskeletal: Strength & Muscle Tone: within normal limits Gait & Station: normal Patient leans: N/A  Psychiatric Specialty Exam: Physical Exam  Nursing note and vitals reviewed. Constitutional: He appears well-developed.  HENT:  Head: Normocephalic.  Neurological: He is alert.  Psychiatric: He has a normal mood and affect. His behavior is normal.    Review of Systems  Psychiatric/Behavioral: Positive for depression, hallucinations and suicidal ideas. The patient is nervous/anxious and has insomnia.     Blood pressure 129/74, pulse 90, temperature 98.3 F (36.8 C), temperature source Oral, resp. rate 20, height 5\' 8"  (1.727 m), weight (!) 172.8 kg (381 lb).Body mass index is 57.93 kg/m.  General Appearance: Casual hospital gown  Eye Contact:  Good  Speech:  Clear and Coherent and Slow  Volume:  Normal  Mood:  Anxious and Depressed  Affect:  Appropriate, Congruent and Flat  Thought Process:   Coherent, Goal Directed and Descriptions of Associations: Loose  Orientation:  Full (Time, Place, and Person)  Thought Content:  Hallucinations: Auditory Command:  to hurt others Visual and Paranoid Ideation  Will consider unit and room restriction   Suicidal Thoughts:  Yes.  without intent/plan  Homicidal Thoughts:  No  Memory:  Immediate;   Good Recent;   Good Remote;   Good  Judgement:  Fair  Insight:  Fair  Psychomotor Activity:  Normal  Concentration:  Concentration: Fair  Recall:  Good  Fund of Knowledge:  Good  Language:  Good  Akathisia:  No  Handed:    AIMS (if indicated):     Assets:  Communication Skills Leisure Time Physical Health Resilience  ADL's:  Intact  Cognition:  WNL  Sleep:  Number of Hours: 4.75    Treatment Plan Summary: Daily contact with patient to assess and evaluate symptoms and progress in treatment and Medication management.  -Continue with current treatment on 08/06/2017 except with noted  -Schizophrenia             - Continue invega 6mg  po qDay             - Invega Sustenna 234mg  IM once (give today 08/05/17, and plan to give booster injection in 7 days +/- 4 days)    - Continue Risperdal M-Tab 2mg  po q8h prn psychosis/agitation  - HTN             - Continue norvasc 5mg  qDay -EPS             - Continue cogentin 0.5mg  BID - Anxiety             - Cotinue atarax 50mg  po q6h prn anxiety or insomnia -Insomnia               -Discontinue trazodone (pt will  use vistaril)   -Encourage participation in groups and the therapeutic milieu -Discharge planning will be ongoing   Oneta Rackanika N Lewis, NP 08/06/2017, 2:48 PM Agree with NP Progress Note

## 2017-08-06 NOTE — Progress Notes (Signed)
Nursing Progress Note: 7p-7a D: Pt currently presents with a depressed affect and behavior. Pt states "I'm fine. I'm just down. I just feel like I could hurt myself if I had the means, But I don't so here I am." Interacting appropriately with the milieu. Pt reports good sleep during the previous night with current medication regimen. Pt did not attend wrap-up group.  A: Pt provided with medications per providers orders. Pt's labs and vitals were monitored throughout the night. Pt supported emotionally and encouraged to express concerns and questions. Pt educated on medications.  R: Pt's safety ensured with 15 minute and environmental checks. Pt currently denies HI and AVH and endorses passive SI. Pt verbally contracts to seek staff if SI,HI, or AVH occurs and to consult with staff before acting on any harmful thoughts. Will continue to monitor.

## 2017-08-06 NOTE — [facility] Group Notes (Signed)
  [facility] LCSW Group Therapy Note  Date/Time:  08/06/2017 11:15AM-12:00PM  Type of Therapy and Topic:  Group Therapy:  Feelings About Hospitalization  Participation Level:  Active  is Description of Group This process group involved patients discussing their feelings related to being hospitalized, as well as the benefits they see to being in the hospital.  These feelings and benefits were itemized.  The group then brainstormed specific ways in which they could seek those same benefits when they discharge and return home.  Therapeutic Goals 1. Patient will identify and describe positive and negative feelings related to hospitalization 2. Patient will verbalize benefits of hospitalization to themselves personally 3. Patients will brainstorm together ways they can obtain similar benefits in the outpatient setting, identify barriers to wellness and possible solutions  Summary of Patient Progress:  The patient expressed his primary feelings about being hospitalized are good because he needs to have his medications worked on and they are not yet working.  Therapeutic Modalities Cognitive Behavioral Therapy Motivational Interviewing    Ambrose MantleMareida Grossman-Orr, LCSW 08/06/2017, 1:28 PM

## 2017-08-06 NOTE — [facility] Group Notes (Signed)
Adult Psychoeducational Group Note  Date:  08/06/2017 Time:  8:57 PM  Group Topic/Focus:  Wrap-Up Group:   The focus of this group is to help patients review their daily goal of treatment and discuss progress on daily workbooks.  Participation Level:  Did Not Attend     April MansonScott, Cathline Dowen D 08/06/2017, 8:57 PM

## 2017-08-06 NOTE — Progress Notes (Signed)
DAR NOTE: Patient presents with flat affect and depressed mood.  Reports hearing voices to hurt other people and asking to be placed on constant supervision.  Prn Risperdal 2 mg given with good effect.  Described energy level as low and concentration as poor.  Denies pain, auditory and visual hallucinations.  Rates depression at 9, hopelessness at 9, and anxiety at 7.  Maintained on routine safety checks.  Medications given as prescribed.  Support and encouragement offered as needed.  Attended group and participated.  States goal for today is "to be less depressed."  Patient encouraged to be visible in the dayroom for safety.

## 2017-08-07 MED ORDER — RISPERIDONE 2 MG PO TBDP: 3.0000 mg | ORAL_TABLET | Freq: Three times a day (TID) | ORAL | Status: DC | PRN

## 2017-08-07 MED ORDER — PALIPERIDONE ER 6 MG PO TB24
6.0000 mg | ORAL_TABLET | Freq: Every day | ORAL | Status: DC
  Administered 2017-08-08 – 2017-08-11 (×4): 6 mg via ORAL
  Filled 2017-08-07 (×7): qty 1

## 2017-08-07 MED ORDER — RISPERIDONE 2 MG PO TBDP
3.0000 mg | ORAL_TABLET | Freq: Two times a day (BID) | ORAL | Status: DC
  Administered 2017-08-07 – 2017-08-08 (×2): 3 mg via ORAL
  Filled 2017-08-07 (×6): qty 1

## 2017-08-07 MED ORDER — RISPERIDONE 1 MG PO TBDP
ORAL_TABLET | ORAL | Status: AC
  Filled 2017-08-07: qty 1

## 2017-08-07 MED ORDER — PALIPERIDONE ER 6 MG PO TB24
9.0000 mg | ORAL_TABLET | Freq: Every day | ORAL | Status: DC
  Filled 2017-08-07: qty 1

## 2017-08-07 NOTE — Plan of Care (Signed)
D: Until about 1600, Bruce [name] remained in bed in the quiet room, where he was sleeping because he had told night shift staff that his roommate kept the room too hot. Bruce [name] endorsed passive SI as well as AH to harm himself and others, though he did contract for safety while on the unit. He did not attend group or participate in any unit activities. Patient was pleasant and cooperative. He did not appear to be responding to internal stimuli. He kept mostly too himself.   A: Meds given as ordered. No PRNs requested or required. Q15 safety checks maintained. Support/encouragement offered.  R: Pt remains free from harm and continues with treatment. Will continue to monitor for needs/safety.

## 2017-08-07 NOTE — Progress Notes (Signed)
[facility] MD Progress Note  08/07/2017 1:45 PM Bruce [name]  MRN:  [id number]  Subjective:  Bruce [name] reports " I am hearing voice really bad."  Objective: Arlan Organ seen resting in quite room. .Denies suicidal ideation or homicidal ideation today.  Reports auditory hallucinations that are worst at night. Patient is taken Risperdal for agitation and psychosis- reports mild improvement. Discussed medication adjustment with Risperdal  patient was agreeable. patient reports he is compliant with medication without side effects. Support, encouragement and reassurance was provided.    Principal Problem: Schizoaffective disorder, depressive type (HCC) Diagnosis:   Patient Active Problem List   Diagnosis Date Noted  . Schizoaffective disorder, depressive type (HCC) [F25.1] 08/04/2017  . 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 Polysubstance abuse (HCC) [F19.10] 02/27/2017  . Auditory hallucinations [R44.0] 02/27/2017  . Substance induced mood disorder (HCC) [F19.94] 02/27/2017  . Cocaine use disorder, severe, dependence (HCC) [F14.20] 01/05/2017  . Cocaine-induced mood disorder (HCC) [F14.94] 01/05/2017  . Cocaine use disorder, mild, abuse (HCC) [F14.10] 05/03/2016  . Cannabis use disorder, mild, abuse [F12.10] 05/03/2016  . Suicide ideation [R45.851] 04/30/2016  . Paranoid schizophrenia (HCC) [F20.0]   . 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 Alcohol use disorder, moderate, dependence (HCC) [F10.20] 09/22/2015  . Morbid obesity (HCC) [E66.01] 09/22/2015   Total Time spent with patient: 30 minutes  Past Psychiatric History: see H&P  Past Medical History:  Past Medical History:  Diagnosis Date  . Depression   . Obesity   . Schizophrenia (HCC)    History reviewed. No pertinent surgical history. Family History:  Family History  Problem Relation Age of Onset  . Schizophrenia Mother    Family Psychiatric  History: see H&P Social History:  Social History    Substance and Sexual Activity  Alcohol Use No     Social History   Substance and Sexual Activity  Drug Use Yes  . Types: Marijuana, "Crack" cocaine, Cocaine   Comment: "in the past"    Social History   Socioeconomic History  . Marital status: Single    Spouse name: None  . Number of children: None  . Years of education: None  . Highest education level: None  Social Needs  . Financial resource strain: None  . Food insecurity - worry: None  . Food insecurity - inability: None  . Transportation needs - medical: None  . Transportation needs - non-medical: None  Occupational History  . None  Tobacco Use  . Smoking status: Never Smoker  . Smokeless tobacco: Never Used  Substance and Sexual Activity  . Alcohol use: No  . Drug use: Yes    Types: Marijuana, "Crack" cocaine, Cocaine    Comment: "in the past"  . Sexual activity: Not Currently  Other Topics Concern  . None  Social History Narrative  . None   Additional Social History:                         Sleep: Poor  Appetite:  Good  Current Medications: Current Facility-Administered Medications  Medication Dose Route Frequency Provider Last Rate Last Dose  . acetaminophen (TYLENOL) tablet 650 mg  650 mg Oral Q6H PRN Rankin, Shuvon B, NP      . alum & mag hydroxide-simeth (MAALOX/MYLANTA) 200-200-20 MG/5ML suspension 30 mL  30 mL Oral Q4H PRN Rankin, Shuvon B, NP   30 mL at 08/06/17 0349  . amLODipine (NORVASC) tablet 5 mg  5 mg Oral Daily Sanjuana Kava, NP  5 mg at 08/07/17 1215  . benztropine (COGENTIN) tablet 0.5 mg  0.5 mg Oral BID WC Rankin, Shuvon B, NP   0.5 mg at 08/07/17 1214  . hydrOXYzine (ATARAX/VISTARIL) tablet 50 mg  50 mg Oral Q6H PRN Micheal Likensainville, Christopher T, MD   50 mg at 08/07/17 0222  . Influenza vac split quadrivalent PF (FLUARIX) injection 0.5 mL  0.5 mL Intramuscular Tomorrow-1000 Micheal Likensainville, Christopher T, MD      . Melene Muller[START ON 08/12/2017] paliperidone (INVEGA SUSTENNA) injection 156  mg  156 mg Intramuscular Q30 days Jolyne Loaainville, Christopher T, MD      . paliperidone (INVEGA) 24 hr tablet 6 mg  6 mg Oral Daily Micheal Likensainville, Christopher T, MD   6 mg at 08/07/17 1215  . risperiDONE (RISPERDAL M-TABS) disintegrating tablet 2 mg  2 mg Oral Q8H PRN Micheal Likensainville, Christopher T, MD   2 mg at 08/06/17 [id number]    Lab Results:  No results found for this or any previous visit (from the past 48 hour(s)).  Blood Alcohol level:  Lab Results  Component Value Date   ETH <10 08/02/2017   ETH <5 04/02/2017    Metabolic Disorder Labs: Lab Results  Component Value Date   HGBA1C 5.2 10/12/2016   MPG 103 10/12/2016   MPG 108 09/26/2015   Lab Results  Component Value Date   PROLACTIN 63.9 (H) 10/12/2016   PROLACTIN 19.9 (H) 05/06/2016   Lab Results  Component Value Date   CHOL 148 08/05/2017   TRIG 73 08/05/2017   HDL 40 (L) 08/05/2017   CHOLHDL 3.7 08/05/2017   VLDL 15 08/05/2017   LDLCALC 93 08/05/2017   LDLCALC 88 10/12/2016    Physical Findings: AIMS: Facial and Oral Movements Muscles of Facial Expression: None, normal Lips and Perioral Area: None, normal Jaw: None, normal Tongue: None, normal,Extremity Movements Upper (arms, wrists, hands, fingers): None, normal Lower (legs, knees, ankles, toes): None, normal, Trunk Movements Neck, shoulders, hips: None, normal, Overall Severity Severity of abnormal movements (highest score from questions above): None, normal Incapacitation due to abnormal movements: None, normal Patient's awareness of abnormal movements (rate only patient's report): No Awareness, Dental Status Current problems with teeth and/or dentures?: No Does patient usually wear dentures?: No  CIWA:    COWS:     Musculoskeletal: Strength & Muscle Tone: within normal limits Gait & Station: normal Patient leans: N/A  Psychiatric Specialty Exam: Physical Exam  Nursing note and vitals reviewed. Constitutional: He appears well-developed.  HENT:  Head:  Normocephalic.  Neurological: He is alert.  Psychiatric: He has a normal mood and affect. His behavior is normal.    Review of Systems  Psychiatric/Behavioral: Positive for depression, hallucinations and suicidal ideas. The patient is nervous/anxious and has insomnia.     Blood pressure 122/71, pulse 94, temperature 98.1 F (36.7 C), resp. rate 20, height 5\' 8"  (1.727 m), weight (!) 172.8 kg (381 lb).Body mass index is 57.93 kg/m.  General Appearance: Casual hospital gown  Eye Contact:  Good  Speech:  Clear and Coherent and Slow  Volume:  Normal  Mood:  Anxious and Depressed  Affect:  Appropriate, Congruent and Flat  Thought Process:  Coherent, Goal Directed and Descriptions of Associations: Loose  Orientation:  Full (Time, Place, and Person)  Thought Content:  Hallucinations: Auditory Command:  to hurt others Visual and Paranoid Ideation  Will consider unit and room restriction   Suicidal Thoughts:  Yes.  without intent/plan  Homicidal Thoughts:  No  Memory:  Immediate;  Good Recent;   Good Remote;   Good  Judgement:  Fair  Insight:  Fair  Psychomotor Activity:  Normal  Concentration:  Concentration: Fair  Recall:  Good  Fund of Knowledge:  Good  Language:  Good  Akathisia:  No  Handed:    AIMS (if indicated):     Assets:  Communication Skills Leisure Time Physical Health Resilience  ADL's:  Intact  Cognition:  WNL  Sleep:  Number of Hours: 2.25    Treatment Plan Summary: Daily contact with patient to assess and evaluate symptoms and progress in treatment and Medication management.  -Continue with current treatment on 08/07/2017 except with noted  -Schizophrenia             - Continue invega 6mg  po qDay             - Invega Sustenna 234mg  IM once (give today 08/05/17, and plan to give booster injection in 7 days +/- 4 days)    - Adjusted Risperdal M-Tab 2 mg to 3 mg po q12h prn psychosis/agitation  - HTN             - Continue norvasc 5mg  qDay -EPS              - Continue cogentin 0.5mg  BID - Anxiety             - Cotinue atarax 50mg  po q6h prn anxiety or insomnia -Insomnia               -Discontinue trazodone (pt will use vistaril)   -Encourage participation in groups and the therapeutic milieu -Discharge planning will be ongoing   Oneta Rackanika N Lewis, NP 08/07/2017, 1:45 PM  Agree with NP Progress Note

## 2017-08-07 NOTE — [facility] Group Notes (Signed)
[facility] Group Notes: (Clinical Social Work)   08/07/2017      Type of Therapy:  Group Therapy   Participation Level:  Did Not Attend despite MHT prompting   Ambrose MantleMareida Grossman-Orr, LCSW 08/07/2017, 12:28 PM

## 2017-08-08 MED ORDER — PALIPERIDONE PALMITATE 156 MG/ML IM SUSP
156.0000 mg | INTRAMUSCULAR | Status: DC
  Filled 2017-08-08: qty 1

## 2017-08-08 MED ORDER — IBUPROFEN 600 MG PO TABS
600.0000 mg | ORAL_TABLET | Freq: Four times a day (QID) | ORAL | Status: DC | PRN
  Administered 2017-08-08 – 2017-08-10 (×5): 600 mg via ORAL
  Filled 2017-08-08 (×5): qty 1

## 2017-08-08 MED ORDER — OLANZAPINE 5 MG PO TBDP: 5.0000 mg | ORAL_TABLET | Freq: Three times a day (TID) | ORAL | Status: DC | PRN

## 2017-08-08 NOTE — Progress Notes (Signed)
Adult Psychoeducational Group Note  Date:  08/08/2017 Time:  2:48 AM  Group Topic/Focus:  Wrap-Up Group:   The focus of this group is to help patients review their daily goal of treatment and discuss progress on daily workbooks.  Participation Level:  Minimal  Participation Quality:  Appropriate  Affect:  Flat  Cognitive:  Oriented  Insight: Appropriate  Engagement in Group:  Engaged  Modes of Intervention:  Socialization and Support  Additional Comments:  Patient attended and participated in group tonight. He reports that he slept most of the day today because he did not get much rest last night. He did went for his meals.  Lita MainsFrancis, Gibson Lad [facility] 08/08/2017, 2:48 AM

## 2017-08-08 NOTE — [facility] Group Notes (Signed)
LCSW Group Therapy Note   08/08/2017 1:15pm   Type of Therapy and Topic:  Group Therapy:  Overcoming Obstacles   Participation Level:  Minimal   Description of Group:    In this group patients will be encouraged to explore what they see as obstacles to their own wellness and recovery. They will be guided to discuss their thoughts, feelings, and behaviors related to these obstacles. The group will process together ways to cope with barriers, with attention given to specific choices patients can make. Each patient will be challenged to identify changes they are motivated to make in order to overcome their obstacles. This group will be process-oriented, with patients participating in exploration of their own experiences as well as giving and receiving support and challenge from other group members.   Therapeutic Goals: 1. Patient will identify personal and current obstacles as they relate to admission. 2. Patient will identify barriers that currently interfere with their wellness or overcoming obstacles.  3. Patient will identify feelings, thought process and behaviors related to these barriers. 4. Patient will identify two changes they are willing to make to overcome these obstacles:      Summary of Patient Progress   Stayed the entire time, engaged throughout.  Talked about being homeless for 12 years, and what a challenge that is.  "Everytime I am about to get housing, something gets in the way of it happening.  While stating that he generally has no control over this, he also admitted that there are times that he has not followed through on his end.  "Sometimes I was supposed to make a call, and I didn't have a phone.  But I didn't ask anyone else for their phone either."   Therapeutic Modalities:   Cognitive Behavioral Therapy Solution Focused Therapy Motivational Interviewing Relapse Prevention Therapy  Bruce RogueRodney B [name], LCSW 08/08/2017 4:25 PM

## 2017-08-08 NOTE — Progress Notes (Signed)
[facility]  08/08/2017 5:36 PM Bruce OrganDwight Bily  MRN:  [id number] Subjective:   Bruce [name] is a 41 y/o M with history of schizoaffective depressive type who was admitted voluntarily with worsening depression, AH of demonic voice commanding him to hurt himself and others, SI with plan to jump from a bridge, poor medication adherence, and illicit substance use of cocaine. Pt was restarted on Invega oral formulation and transitioned to [place]. He continued to report ongoing AH over the weekend and PRN dose of risperdal was increased.  Upon evaluation today, pt reports he is still struggling with AH. He explains, "The voices are still bad - it hasn't changed." He denies VH/SI/HI. He notes that he has been tolerating injection of [place] Sustenna well - he thinks that the injection helped, but his PRN of risperdal M-Tabs feels as though it is not helpful. We discussed option of changing to PRN of zyprexa and pt was in agreement. He agrees to continue his other medications without changes. We discussed planning to schedule the next [place] Sustenna injection (the 1 week booster) for tomorrow and pt was in agreement. He had no further questions, comments, or concerns.   Principal Problem: Schizoaffective disorder, depressive type (HCC) Diagnosis:   Patient Active Problem List   Diagnosis Date Noted  . Schizoaffective disorder, depressive type (HCC) [F25.1] 08/04/2017  . 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 Polysubstance abuse (HCC) [F19.10] 02/27/2017  . Auditory hallucinations [R44.0] 02/27/2017  . Substance induced mood disorder (HCC) [F19.94] 02/27/2017  . Cocaine use disorder, severe, dependence (HCC) [F14.20] 01/05/2017  . Cocaine-induced mood disorder (HCC) [F14.94] 01/05/2017  . Cocaine use disorder, mild, abuse (HCC) [F14.10] 05/03/2016  . Cannabis use disorder, mild, abuse [F12.10] 05/03/2016  . Suicide ideation [R45.851] 04/30/2016  . Paranoid  schizophrenia (HCC) [F20.0]   . 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 Alcohol use disorder, moderate, dependence (HCC) [F10.20] 09/22/2015  . Morbid obesity (HCC) [E66.01] 09/22/2015   Total Time spent with patient: 30 minutes  Past Psychiatric History: see H&P  Past Medical History:  Past Medical History:  Diagnosis Date  . Depression   . Obesity   . Schizophrenia (HCC)    History reviewed. No pertinent surgical history. Family History:  Family History  Problem Relation Age of Onset  . Schizophrenia Mother    Family Psychiatric  History: see H&P Social History:  Social History   Substance and Sexual Activity  Alcohol Use No     Social History   Substance and Sexual Activity  Drug Use Yes  . Types: Marijuana, "Crack" cocaine, Cocaine   Comment: "in the past"    Social History   Socioeconomic History  . Marital status: Single    Spouse name: None  . Number of children: None  . Years of education: None  . Highest education level: None  Social Needs  . Financial resource strain: None  . Food insecurity - worry: None  . Food insecurity - inability: None  . Transportation needs - medical: None  . Transportation needs - non-medical: None  Occupational History  . None  Tobacco Use  . Smoking status: Never Smoker  . Smokeless tobacco: Never Used  Substance and Sexual Activity  . Alcohol use: No  . Drug use: Yes    Types: Marijuana, "Crack" cocaine, Cocaine    Comment: "in the past"  . Sexual activity: Not Currently  Other Topics Concern  . None  Social History Narrative  . None   Additional  Social History:                         Sleep: Fair  Appetite:  Fair  Current Medications: Current Facility-Administered Medications  Medication Dose Route Frequency Provider Last Rate Last Dose  . alum & mag hydroxide-simeth (MAALOX/MYLANTA) 200-200-20 MG/5ML suspension 30 mL  30 mL Oral Q4H PRN Rankin, Shuvon B, NP   30 mL at 08/06/17 0349  .  amLODipine (NORVASC) tablet 5 mg  5 mg Oral Daily Armandina StammerNwoko, Agnes I, NP   5 mg at 08/08/17 0820  . benztropine (COGENTIN) tablet 0.5 mg  0.5 mg Oral BID WC Rankin, Shuvon B, NP   0.5 mg at 08/08/17 1713  . hydrOXYzine (ATARAX/VISTARIL) tablet 50 mg  50 mg Oral Q6H PRN Micheal Likensainville, Murray Guzzetta T, MD   50 mg at 08/07/17 2146  . ibuprofen (ADVIL,MOTRIN) tablet 600 mg  600 mg Oral Q6H PRN Micheal Likensainville, Mushka Laconte T, MD   600 mg at 08/08/17 1502  . Influenza vac split quadrivalent PF (FLUARIX) injection 0.5 mL  0.5 mL Intramuscular Tomorrow-1000 Benancio Osmundson T, MD      . OLANZapine zydis (ZYPREXA) disintegrating tablet 5 mg  5 mg Oral Q8H PRN Micheal Likensainville, Ahja Martello T, MD      . Melene Muller[START ON 08/12/2017] paliperidone (INVEGA SUSTENNA) injection 156 mg  156 mg Intramuscular Q30 days Jolyne Loaainville, Carmine Youngberg T, MD      . paliperidone (INVEGA) 24 hr tablet 6 mg  6 mg Oral Daily Oneta RackLewis, Tanika N, NP   6 mg at 08/08/17 0820    Lab Results: No results found for this or any previous visit (from the past 48 hour(s)).  Blood Alcohol level:  Lab Results  Component Value Date   ETH <10 08/02/2017   ETH <5 04/02/2017    Metabolic Disorder Labs: Lab Results  Component Value Date   HGBA1C 5.2 10/12/2016   MPG 103 10/12/2016   MPG 108 09/26/2015   Lab Results  Component Value Date   PROLACTIN 63.9 (H) 10/12/2016   PROLACTIN 19.9 (H) 05/06/2016   Lab Results  Component Value Date   CHOL 148 08/05/2017   TRIG 73 08/05/2017   HDL 40 (L) 08/05/2017   CHOLHDL 3.7 08/05/2017   VLDL 15 08/05/2017   LDLCALC 93 08/05/2017   LDLCALC 88 10/12/2016    Physical Findings: AIMS: Facial and Oral Movements Muscles of Facial Expression: None, normal Lips and Perioral Area: None, normal Jaw: None, normal Tongue: None, normal,Extremity Movements Upper (arms, wrists, hands, fingers): None, normal Lower (legs, knees, ankles, toes): None, normal, Trunk Movements Neck, shoulders, hips: None, normal, Overall  Severity Severity of abnormal movements (highest score from questions above): None, normal Incapacitation due to abnormal movements: None, normal Patient's awareness of abnormal movements (rate only patient's report): No Awareness, Dental Status Current problems with teeth and/or dentures?: No Does patient usually wear dentures?: No  CIWA:    COWS:     Musculoskeletal: Strength & Muscle Tone: within normal limits Gait & Station: normal Patient leans: N/A  Psychiatric Specialty Exam: Physical Exam  Nursing note and vitals reviewed.   Review of Systems  Constitutional: Negative for chills and fever.  Respiratory: Negative for cough and shortness of breath.   Cardiovascular: Negative for chest pain.  Gastrointestinal: Negative for abdominal pain, heartburn, nausea and vomiting.  Psychiatric/Behavioral: Positive for depression and hallucinations. Negative for suicidal ideas. The patient is nervous/anxious.     Blood pressure 123/73, pulse 90, temperature 97.9 F (  36.6 C), temperature source Oral, resp. rate 20, height 5\' 8"  (1.727 m), weight (!) 172.8 kg (381 lb).Body mass index is 57.93 kg/m.  General Appearance: Casual and Fairly Groomed  Eye Contact:  Good  Speech:  Clear and Coherent and Normal Rate  Volume:  Normal  Mood:  Anxious  Affect:  Appropriate, Congruent and Flat  Thought Process:  Coherent and Goal Directed  Orientation:  Full (Time, Place, and Person)  Thought Content:  Hallucinations: Auditory  Suicidal Thoughts:  No  Homicidal Thoughts:  No  Memory:  Immediate;   Good Recent;   Good Remote;   Good  Judgement:  Fair  Insight:  Fair  Psychomotor Activity:  Normal  Concentration:  Concentration: Good  Recall:  Good  Fund of Knowledge:  Fair  Language:  Fair  Akathisia:  No  Handed:    AIMS (if indicated):     Assets:  Manufacturing systems engineer Physical Health Resilience Social Support  ADL's:  Intact  Cognition:  WNL  Sleep:  Number of Hours: 6.5      Treatment Plan Summary: Daily contact with patient to assess and evaluate symptoms and progress in treatment and Medication management. Pt reports ongoing AH and anxiety, and he notes that PRN of risperdal is not helping, so we will change to PRN of zyprexa and we will schedule booster injection for Invega for tomorrow.  -Schizophrenia - Continue invega 6mg  po qDay Hinda Glatter Sustenna 234mg  IM once given 08/05/17, and plan to give booster injection tomorrow 08/09/17 of Sustenna 156mg  IM q28 days)                         -Discontinue Risperdal    - Start zydis 5mg  po q8h prn psychosis  - HTN - Continue norvasc 5mg  qDay -EPS - Continue cogentin 0.5mg  BID - Anxiety - Cotinue atarax 50mg  po q6h prn anxiety or insomnia -Insomnia   -continue atarax 50mg  po q6h ptn anxiety/insomnia  -Encourage participation in groups and the therapeutic milieu -Discharge planning will be ongoing     Micheal Likens, MD 08/08/2017, 5:36 PM

## 2017-08-08 NOTE — Progress Notes (Signed)
D: Writer observed pt in the dayroom. When asked during the assessment if he was still experiencing auditory or visual hallucinations pt replied, "yeah". When asked if he could make anything out, pt stated, "demonic". Pt has no questions or concerns.    A:  Support and encouragement was offered. 15 min checks continued for safety.  R: Pt remains safe.

## 2017-08-08 NOTE — Progress Notes (Signed)
Adult Psychoeducational Group Note  Date:  08/08/2017 Time:  9:38 PM  Group Topic/Focus:  Wrap-Up Group:   The focus of this group is to help patients review their daily goal of treatment and discuss progress on daily workbooks.  Participation Level:  Minimal  Participation Quality:  none  Affect:  Flat  Cognitive:  Oriented  Insight: None  Engagement in Group:  Poor  Modes of Intervention:  Socialization and Support  Additional Comments:  Patient attended group, however did not participate.  Lita MainsFrancis, Nithya Meriweather [facility] 08/08/2017, 9:38 PM

## 2017-08-08 NOTE — Progress Notes (Signed)
D: Pt doesn't engage the Clinical research associatewriter in lengthy conversation. Mainly answers yes or no questions and mentions that he's still hearing and seeing demons. Pt has no questions or concerns.    A:  Support and encouragement was offered. 15 min checks continued for safety.  R: Pt remains safe.

## 2017-08-08 NOTE — Progress Notes (Signed)
Recreation Therapy Notes  Date: 08/08/17 Time: 1000 Location: 500 Hall Dayroom  Group Topic: Coping Skills  Goal Area(s) Addresses:  Patient will be able to identify positive coping skills. Patient will be able to identify benefits of using coping skills. Patient will be able to identify benefits of using coping skills post d/c.  Intervention: Mind map, pencils, white board, marker, eraser  Activity: Mind map.  Patients and LRT filled in the first eight boxes of the mind map together with depression, anxiety, hygiene, wellness, family, withdrawal, work and social interaction.  Patients were to then identify three coping skills for each of the situations identified.  The group would then reconvene and coping skills would be written on the board.   Education: PharmacologistCoping Skills, Building control surveyorDischarge Planning.   Education Outcome: Acknowledges understanding/In group clarification offered/Needs additional education.   Clinical Observations/Feedback: Pt did not attend group.    Caroll RancherMarjette Vedant Shehadeh, LRT/CTRS         Caroll RancherLindsay, Danyal Whitenack A 08/08/2017 12:31 PM

## 2017-08-08 NOTE — Plan of Care (Signed)
D: Bruce [name] has been more visible in the milieu today. He has complained of tooth pain and bilateral hand swelling, and he continues to endorse seeing and hearing demons that telling him to harm himself and others. He contracts for safety on the unit, however, and he does not appear to be responding to internal stimuli. He has been compliant with medications and presented on behavioral management issues. He said he does not feel he is improving. On his self inventory, he reported fair sleep, good appetite, low energy level, and poor concentration. He rated his depression 9/10, hopelessness 8/10, and anxiety 7/10.   A: Meds given as ordered. Acetaminophen 650 mg PO given for toothache. Q15 safety checks maintained. Support/encouragement offered.  R: Pt remains free from harm and continues with treatment. Will continue to monitor for needs/safety.

## 2017-08-09 MED ORDER — PALIPERIDONE PALMITATE 156 MG/ML IM SUSP
156.0000 mg | Freq: Once | INTRAMUSCULAR | Status: AC
Start: 2017-08-09 — End: 2017-08-09
  Administered 2017-08-09: 156 mg via INTRAMUSCULAR

## 2017-08-09 NOTE — Progress Notes (Signed)
Patient has been isolative to room this shift.  Patient has been compliant with meds.  Patient was noted to be flat and forwarded little during conversation.  Patient reports SI and AVH.  Patient has had no incidents of behavioral dyscontrol.   Assess patient for safety, offer medications as prescribed, engage patient in 1:1 staff talks.   Patient able to contract for safety. Continue to monitor as prescribed.

## 2017-08-09 NOTE — Tx Team (Signed)
Interdisciplinary Treatment and Diagnostic Plan Update  08/09/2017 Time of Session: 10:09 AM  Bruce [name] MRN: [id number]  Principal Diagnosis: Schizoaffective disorder, depressive type ([place])  Secondary Diagnoses: Principal Problem:   Schizoaffective disorder, depressive type ([place]) Active Problems:   Cocaine use disorder, mild, abuse ([place])   Current Medications:  Current Facility-Administered Medications  Medication Dose Route Frequency Provider Last Rate Last Dose  . alum & mag hydroxide-simeth (MAALOX/MYLANTA) 200-200-20 MG/5ML suspension 30 mL  30 mL Oral Q4H PRN Rankin, Shuvon B, NP   30 mL at 08/06/17 0349  . amLODipine (NORVASC) tablet 5 mg  5 mg Oral Daily Lindell Spar I, NP   5 mg at 08/09/17 0805  . benztropine (COGENTIN) tablet 0.5 mg  0.5 mg Oral BID WC Rankin, Shuvon B, NP   0.5 mg at 08/09/17 0805  . hydrOXYzine (ATARAX/VISTARIL) tablet 50 mg  50 mg Oral Q6H PRN Pennelope Bracken, MD   50 mg at 08/08/17 2127  . ibuprofen (ADVIL,MOTRIN) tablet 600 mg  600 mg Oral Q6H PRN Pennelope Bracken, MD   600 mg at 08/09/17 0805  . Influenza vac split quadrivalent PF (FLUARIX) injection 0.5 mL  0.5 mL Intramuscular Tomorrow-1000 [place], Christopher T, MD      . OLANZapine zydis (ZYPREXA) disintegrating tablet 5 mg  5 mg Oral Q8H PRN Pennelope Bracken, MD      . paliperidone (INVEGA SUSTENNA) injection 156 mg  156 mg Intramuscular Once Maris Berger T, MD      . paliperidone (INVEGA) 24 hr tablet 6 mg  6 mg Oral Daily Derrill Center, NP   6 mg at 08/09/17 0805    PTA Medications: Medications Prior to Admission  Medication Sig Dispense Refill Last Dose  . acetaminophen (TYLENOL) 500 MG tablet Take 500-1,000 mg by mouth every 6 (six) hours as needed for moderate pain (for pain or headaches).    Not Taking at Unknown time  . amLODipine (NORVASC) 5 MG tablet Take 5 mg by mouth daily.    Not Taking at Unknown time  . benztropine (COGENTIN) 0.5 MG tablet  Take 1 tablet (0.5 mg total) by mouth 2 (two) times daily with a meal. (Patient not taking: Reported on 08/02/2017) 60 tablet 0 Not Taking at Unknown time  . hydrOXYzine (ATARAX/VISTARIL) 25 MG tablet Take 1 tablet (25 mg total) by mouth every 6 (six) hours as needed for anxiety. (Patient not taking: Reported on 03/31/2017) 30 tablet 0 Not Taking at Unknown time  . Paliperidone Palmitate (INVEGA SUSTENNA IM) Inject 150 mg into the muscle every 30 (thirty) days.    Not Taking at Unknown time  . traZODone (DESYREL) 150 MG tablet Take 1 tablet (150 mg total) by mouth at bedtime and may repeat dose one time if needed. (Patient not taking: Reported on 08/02/2017) 30 tablet 0 Not Taking at Unknown time    Patient Stressors: Medication change or noncompliance Substance abuse  Patient Strengths: Ability for insight Average or above average intelligence General fund of knowledge Motivation for treatment/growth  Treatment Modalities: Medication Management, Group therapy, Case management,  1 to 1 session with clinician, Psychoeducation, Recreational therapy.   Physician Treatment Plan for Primary Diagnosis: Schizoaffective disorder, depressive type ([place]) Long Term Goal(s): Improvement in symptoms so as ready for discharge  Short Term Goals:    Medication Management: Evaluate patient's response, side effects, and tolerance of medication regimen.  Therapeutic Interventions: 1 to 1 sessions, Unit Group sessions and Medication administration.  Evaluation of Outcomes: Progressing  Physician Treatment Plan for Secondary Diagnosis: Principal Problem:   Schizoaffective disorder, depressive type ([place]) Active Problems:   Cocaine use disorder, mild, abuse ([place])   Long Term Goal(s): Improvement in symptoms so as ready for discharge  Short Term Goals:    Medication Management: Evaluate patient's response, side effects, and tolerance of medication regimen.  Therapeutic Interventions: 1 to 1 sessions,  Unit Group sessions and Medication administration.  Evaluation of Outcomes: Progressing   12/21:  Pt reports ongoing AH and anxiety, and he notes that PRN of risperdal is not helping, so we will change to PRN of zyprexa and we will schedule booster injection for Invega for tomorrow.  -Schizophrenia - Continue invega 59m po qDay -Lorayne BenderSustenna 2374mIM once given 08/05/17, and plan to give booster injection tomorrow 08/09/17 of Sustenna 15637mM q28 days) -DiscontinueRisperdal              - Start zydis 5mg47m q8h prn psychosis  - HTN - Continue norvasc 5mg 15my -EPS - Continue cogentin 0.5mg B67m- Anxiety -Cotinue atarax 50mg p65mh prn anxiety or insomnia -Insomnia   -continue atarax 50mg po59m ptn anxiety/insomnia     RN Treatment Plan for Primary Diagnosis: Schizoaffective disorder, depressive type (HCC) Lon[place]iffsideerm Goal(s): Knowledge of disease and therapeutic regimen to maintain health will improve  Short Term Goals: Ability to identify and develop effective coping behaviors will improve and Compliance with prescribed medications will improve  Medication Management: RN will administer medications as ordered by provider, will assess and evaluate patient's response and provide education to patient for prescribed medication. RN will report any adverse and/or side effects to prescribing provider.  Therapeutic Interventions: 1 on 1 counseling sessions, Psychoeducation, Medication administration, Evaluate responses to treatment, Monitor vital signs and CBGs as ordered, Perform/monitor CIWA, COWS, AIMS and Fall Risk screenings as ordered, Perform wound care treatments as ordered.  Evaluation of Outcomes: Progressing   LCSW Treatment Plan for Primary Diagnosis: Schizoaffective disorder, depressive type (HCC) Lon[place]erm Goal(s): Safe transition to appropriate next level of care at  discharge, Engage patient in therapeutic group addressing interpersonal concerns.  Short Term Goals: Engage patient in aftercare planning with referrals and resources  Therapeutic Interventions: Assess for all discharge needs, 1 to 1 time with Social worker, Explore available resources and support systems, Assess for adequacy in community support network, Educate family and significant other(s) on suicide prevention, Complete Psychosocial Assessment, Interpersonal group therapy.  Evaluation of Outcomes: Met  [facility]  Progress in Treatment: Attending groups: Yes Participating in groups: Yes Taking medication as prescribed: Yes Toleration medication: Yes, no side effects reported at this time Family/Significant other contact made: No Patient understands diagnosis: Yes AEB asking for help with mental health symptoms Discussing patient identified problems/goals with staff: Yes Medical problems stabilized or resolved: Yes Denies suicidal/homicidal ideation: Yes Issues/concerns per patient self-inventory: None Other: N/A  New problem(s) identified: None identified at this time.   New Short Term/Long Term Goal(s):"Voices are bothering me"   Discharge Plan or Barriers:   Reason for Continuation of Hospitalization:  Depression Hallucinations  Medication stabilization Suicidal ideation   Estimated Length of Stay: 12/21  Attendees: Patient: Bruce SCalieb [id number]  10:09 AM  Physician: ChristopMaris Berger18/2018  10:09 AM  Nursing: ElizabetSena Hitch18/2018  10:09 AM  RN Care Manager: JenniferLars Pinks18/2018  10:09 AM  Social Worker: Rod NortRipley [email or web address]  10:09 AM  Recreational  Therapist: Winfield Cunas 08/09/2017  10:09 AM  Other: Norberto Sorenson 08/09/2017  10:09 AM  Other:  08/09/2017  10:09 AM    Scribe for Treatment Team:  Roque Lias LCSW 08/09/2017 10:09 AM

## 2017-08-09 NOTE — [facility] Group Notes (Signed)
LCSW Group Therapy Note   08/09/2017 1:15pm   Type of Therapy and Topic:  Group Therapy:  Positive Affirmations   Participation Level:  Minimal  Description of Group: This group addressed positive affirmation toward self and others. Patients went around the room and identified two positive things about themselves and two positive things about a peer in the room. Patients reflected on how it felt to share something positive with others, to identify positive things about themselves, and to hear positive things from others. Patients were encouraged to have a daily reflection of positive characteristics or circumstances.  Therapeutic Goals 1. Patient will verbalize two of their positive qualities 2. Patient will demonstrate empathy for others by stating two positive qualities about a peer in the group 3. Patient will verbalize their feelings when voicing positive self affirmations and when voicing positive affirmations of others 4. Patients will discuss the potential positive impact on their wellness/recovery of focusing on positive traits of self and others. Summary of Patient Progress:  Stayed the entire time, engaged throughout.  "I lost my grandfather a number of years ago, and then my grandmother just a couple of years ago.  That's been hard.  I don't think I'm over it yet. But I try to be positive, and I call my mother when I am not doing well."  Admitted he does not know where his mother currently is.  "[facility] [place]."  Therapeutic Modalities Cognitive Behavioral Therapy Motivational Interviewing  Ida RogueRodney B Bobetta Korf, LCSW 08/09/2017 2:28 PM

## 2017-08-09 NOTE — Progress Notes (Signed)
[facility] MD Progress Note  08/09/2017 1:04 PM Dj Senteno  MRN:  [id number] Subjective:   Bruce [name] is a 41 y/o M with history of schizoaffective depressive type who was admitted voluntarily with worsening depression, AH of demonic voice commanding him to hurt himself and others, SI with plan to jump from a bridge, poor medication adherence, and illicit substance use of cocaine.Pt was restarted on Invega oral formulation and transitioned to [place] which he received first injection of 234mg  on 08/05/17 and booster injection of 156mg  scheduled for today 08/09/17. He has continued to report ongoing AH, but overall his symptoms have improved during this stay.   Upon evaluationtoday, pt reports he continues to struggle with symptoms of psychosis, particularly in the evening. He explains, "I woke up last night and felt like I was in a dark tunnel; I kept seeing and hearing demons." Pt denies current SI/HI/AH/VH this morning. He is generally pleasant and does not appear to have any distress. Discussed with patient that he previously has presentations to inpatient psychiatry where he has been discharged with ongoing AH as long as he is able to keep himself safe, and pt verbalized good understanding. We discussed potential discharge in the coming days to a shelter or substance use treatment, and pt expressed interest in either [facility] or [facility]. Discussed with patient that we will continue his current medication regimen and look into referrals for substance treatment with plan to discharge in the next few days if pt continues to have some overall improvement of his symptoms. Pt was in agreement with above plan and he had no further questions, comments, or concerns.     Principal Problem: Schizoaffective disorder, depressive type (HCC) Diagnosis:   Patient Active Problem List   Diagnosis Date Noted  . Schizoaffective disorder, depressive type (HCC) [F25.1] 08/04/2017  .  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 Polysubstance abuse (HCC) [F19.10] 02/27/2017  . Auditory hallucinations [R44.0] 02/27/2017  . Substance induced mood disorder (HCC) [F19.94] 02/27/2017  . Cocaine use disorder, severe, dependence (HCC) [F14.20] 01/05/2017  . Cocaine-induced mood disorder (HCC) [F14.94] 01/05/2017  . Cocaine use disorder, mild, abuse (HCC) [F14.10] 05/03/2016  . Cannabis use disorder, mild, abuse [F12.10] 05/03/2016  . Suicide ideation [R45.851] 04/30/2016  . Paranoid schizophrenia (HCC) [F20.0]   . 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 Alcohol use disorder, moderate, dependence (HCC) [F10.20] 09/22/2015  . Morbid obesity (HCC) [E66.01] 09/22/2015   Total Time spent with patient: 30 minutes  Past Psychiatric History: see H&P  Past Medical History:  Past Medical History:  Diagnosis Date  . Depression   . Obesity   . Schizophrenia (HCC)    History reviewed. No pertinent surgical history. Family History:  Family History  Problem Relation Age of Onset  . Schizophrenia Mother    Family Psychiatric  History: see H&P Social History:  Social History   Substance and Sexual Activity  Alcohol Use No     Social History   Substance and Sexual Activity  Drug Use Yes  . Types: Marijuana, "Crack" cocaine, Cocaine   Comment: "in the past"    Social History   Socioeconomic History  . Marital status: Single    Spouse name: None  . Number of children: None  . Years of education: None  . Highest education level: None  Social Needs  . Financial resource strain: None  . Food insecurity - worry: None  . Food insecurity - inability: None  . Transportation needs -  medical: None  . Transportation needs - non-medical: None  Occupational History  . None  Tobacco Use  . Smoking status: Never Smoker  . Smokeless tobacco: Never Used  Substance and Sexual Activity  . Alcohol use: No  . Drug use: Yes    Types: Marijuana, "Crack"  cocaine, Cocaine    Comment: "in the past"  . Sexual activity: Not Currently  Other Topics Concern  . None  Social History Narrative  . None   Additional Social History:                         Sleep: Fair  Appetite:  Good  Current Medications: Current Facility-Administered Medications  Medication Dose Route Frequency Provider Last Rate Last Dose  . alum & mag hydroxide-simeth (MAALOX/MYLANTA) 200-200-20 MG/5ML suspension 30 mL  30 mL Oral Q4H PRN Rankin, Shuvon B, NP   30 mL at 08/06/17 0349  . amLODipine (NORVASC) tablet 5 mg  5 mg Oral Daily Armandina StammerNwoko, Agnes I, NP   5 mg at 08/09/17 0805  . benztropine (COGENTIN) tablet 0.5 mg  0.5 mg Oral BID WC Rankin, Shuvon B, NP   0.5 mg at 08/09/17 0805  . hydrOXYzine (ATARAX/VISTARIL) tablet 50 mg  50 mg Oral Q6H PRN Micheal Likensainville, Gaddiel Cullens T, MD   50 mg at 08/08/17 2127  . ibuprofen (ADVIL,MOTRIN) tablet 600 mg  600 mg Oral Q6H PRN Micheal Likensainville, Rashelle Ireland T, MD   600 mg at 08/09/17 0805  . Influenza vac split quadrivalent PF (FLUARIX) injection 0.5 mL  0.5 mL Intramuscular Tomorrow-1000 Brylinn Teaney T, MD      . OLANZapine zydis (ZYPREXA) disintegrating tablet 5 mg  5 mg Oral Q8H PRN Micheal Likensainville, Chaske Paskett T, MD      . paliperidone (INVEGA SUSTENNA) injection 156 mg  156 mg Intramuscular Once Jolyne Loaainville, Daven Pinckney T, MD      . paliperidone (INVEGA) 24 hr tablet 6 mg  6 mg Oral Daily Oneta RackLewis, Tanika N, NP   6 mg at 08/09/17 [id number]    Lab Results: No results found for this or any previous visit (from the past 48 hour(s)).  Blood Alcohol level:  Lab Results  Component Value Date   ETH <10 08/02/2017   ETH <5 04/02/2017    Metabolic Disorder Labs: Lab Results  Component Value Date   HGBA1C 5.2 10/12/2016   MPG 103 10/12/2016   MPG 108 09/26/2015   Lab Results  Component Value Date   PROLACTIN 63.9 (H) 10/12/2016   PROLACTIN 19.9 (H) 05/06/2016   Lab Results  Component Value Date   CHOL 148 08/05/2017   TRIG 73  08/05/2017   HDL 40 (L) 08/05/2017   CHOLHDL 3.7 08/05/2017   VLDL 15 08/05/2017   LDLCALC 93 08/05/2017   LDLCALC 88 10/12/2016    Physical Findings: AIMS: Facial and Oral Movements Muscles of Facial Expression: None, normal Lips and Perioral Area: None, normal Jaw: None, normal Tongue: None, normal,Extremity Movements Upper (arms, wrists, hands, fingers): None, normal Lower (legs, knees, ankles, toes): None, normal, Trunk Movements Neck, shoulders, hips: None, normal, Overall Severity Severity of abnormal movements (highest score from questions above): None, normal Incapacitation due to abnormal movements: None, normal Patient's awareness of abnormal movements (rate only patient's report): No Awareness, Dental Status Current problems with teeth and/or dentures?: No Does patient usually wear dentures?: No  CIWA:    COWS:     Musculoskeletal: Strength & Muscle Tone: within normal limits Gait & Station: normal Patient  leans: N/A  Psychiatric Specialty Exam: Physical Exam  Nursing note and vitals reviewed.   Review of Systems  Constitutional: Negative for chills and fever.  Respiratory: Negative for cough and shortness of breath.   Cardiovascular: Negative for chest pain.  Gastrointestinal: Negative for abdominal pain, heartburn, nausea and vomiting.  Psychiatric/Behavioral: Positive for hallucinations. Negative for depression and suicidal ideas.    Blood pressure 115/67, pulse 74, temperature 98.6 F (37 C), temperature source Oral, resp. rate 16, height 5\' 8"  (1.727 m), weight (!) 172.8 kg (381 lb).Body mass index is 57.93 kg/m.  General Appearance: Casual and Fairly Groomed  Eye Contact:  Good  Speech:  Clear and Coherent and Normal Rate  Volume:  Normal  Mood:  Anxious  Affect:  Appropriate, Congruent and Flat  Thought Process:  Coherent and Goal Directed  Orientation:  Full (Time, Place, and Person)  Thought Content:  Hallucinations: Auditory  Suicidal  Thoughts:  No  Homicidal Thoughts:  No  Memory:  Immediate;   Good Recent;   Good Remote;   Good  Judgement:  Fair  Insight:  Fair  Psychomotor Activity:  Normal  Concentration:  Concentration: Good  Recall:  Good  Fund of Knowledge:  Good  Language:  Good  Akathisia:  No  Handed:    AIMS (if indicated):     Assets:  Manufacturing systems engineerCommunication Skills Physical Health Resilience Social Support  ADL's:  Intact  Cognition:  WNL  Sleep:  Number of Hours: 5.5     Treatment Plan Summary: Daily contact with patient to assess and evaluate symptoms and progress in treatment and Medication management. Pt reports overall improvement of his presenting symptoms, but he continues to have some episodes of AH. He is scheduled for next invega booster injection today. He will look into both shelter and substance use treatment options for likely discharge in the next few days.  -Schizophrenia - Continue invega 6mg  po qDay - Hinda GlatterInvega Sustenna 234mg  IM once given 08/05/17, and plan to give booster injection today 08/09/17 of Sustenna 156mg  IM q28day             - Continue zydis 5mg  po q8h prn psychosis  - HTN - Continue norvasc 5mg  qDay -EPS - Continue cogentin 0.5mg  BID - Anxiety -Cotinue atarax 50mg  po q6h prn anxiety or insomnia -Insomnia   -continue atarax 50mg  po q6h ptn anxiety/insomnia  -Encourage participation in groups and the therapeutic milieu -Discharge planning will be ongoing   Micheal Likenshristopher T Samera Macy, MD 08/09/2017, 1:04 PM

## 2017-08-09 NOTE — Progress Notes (Signed)
Adult Psychoeducational Group Note  Date:  08/09/2017 Time:  9:24 PM  Group Topic/Focus:  Wrap-Up Group:   The focus of this group is to help patients review their daily goal of treatment and discuss progress on daily workbooks.  Participation Level:  Active  Participation Quality:  Appropriate  Affect:  Appropriate  Cognitive:  Appropriate  Insight: Appropriate  Engagement in Group:  Engaged  Modes of Intervention:  Discussion  Additional Comments: The patient expressed that he rates today a 5.The patient also said that he attended all groups.  Octavio Mannshigpen, Sukhman Martine Lee 08/09/2017, 9:24 PM

## 2017-08-09 NOTE — Progress Notes (Signed)
Recreation Therapy Notes  Date: 08/09/17 Time: 0950 Location:  500 Hall Dayroom   Group Topic: Self-Esteem  Goal Area(s) Addresses:  Patient will successfully identify positive attributes about themselves.  Patient will successfully identify benefit of improved self-esteem.   Behavioral Response: Engaged  Intervention: Magazines, scissors, glue sticks, Holiday representativeconstruction paper  Activity: Collage.  Patients were to create a collage that highlighted things about them such as what they like, new things they would like to try, things that mean something to them, etc.  Education:  Self-Esteem, Discharge Planning.   Education Outcome: Acknowledges education/In group clarification offered/Needs additional education  Clinical Observations/Feedback: Pt was quiet but active.  Pt stated he wants his own home and his own room fixed up real nice.  Pt also stated he wanted [employer] money, drinks whiskey and likes frosted mini wheats cereal.    Caroll RancherMarjette Adelma Bow[place]in, LRT/CTRS      Caroll RancherLindsay, Didi Ganaway A 08/09/2017 11:42 AM

## 2017-08-10 NOTE — Progress Notes (Signed)
Recreation Therapy Notes  Date: 08/10/17 Time: 1000 Location: 500 Hall Dayroom  Group Topic: Leisure Education  Goal Area(s) Addresses:  Patient will identify positive leisure activities.  Patient will identify one positive benefit of participation in leisure activities.   Intervention: Various words, dry erase marker, white board, eraser  Activity: Pictionary.  Patients were divided into two teams.  One person from a team would pick a word from the container and draw it on the board.  That team gets one minute to guess the picture drawn on the board.  If the team doesn't guess the picture, the other team can steal the point.  If they don't guess the picture, no one gets the point.  The other team would then go.  Education:  Leisure Education, Building control surveyorDischarge Planning  Education Outcome: Acknowledges education/In group clarification offered/Needs additional education  Clinical Observations/Feedback: Pt did not attend group.   Caroll RancherMarjette Myndi Wamble, LRT/CTRS         Lillia AbedLindsay, Noha Milberger A 08/10/2017 1:10 PM

## 2017-08-10 NOTE — Progress Notes (Signed)
[facility] MD Progress Note  08/10/2017 12:27 PM Bruce [name]  MRN:  [id number] Subjective:   Bruce [name] is a 41 y/o M with history of schizoaffective depressive type who was admitted voluntarily with worsening depression, AH of demonic voice commanding him to hurt himself and others, SI with plan to jump from a bridge, poor medication adherence, and illicit substance use of cocaine.Pt was restarted on Invega oral formulationandtransitionedto [place] which he received first injection of 234mg  on 08/05/17 and booster injection of 156mg  scheduled on 08/09/17. He has continued to report ongoing AH, but overall his symptoms have improved during this stay.   Upon evaluationtoday, pt reports that he has struggled with AH this morning so he decided to rest in bed. He explains, "I started hearing voices real bad so I thought I'd lay down for a bit." She notes that overall AH have improved during his stay. He has some background SI without plan which he characterizes as at baseline and he has no intent of acting on these thoughts. He denies VH and HI. He thinks that his medications have been helpful. Pt is still in agreement with plan to discharge tomorrow, and pt shares that his plan is to stay at [facility]. He agrees to continue his current medication regimen without changes at this time. He had no further questions, comments, or concerns.  Principal Problem: Schizoaffective disorder, depressive type (HCC) Diagnosis:   Patient Active Problem List   Diagnosis Date Noted  . Schizoaffective disorder, depressive type (HCC) [F25.1] 08/04/2017  . 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 Polysubstance abuse (HCC) [F19.10] 02/27/2017  . Auditory hallucinations [R44.0] 02/27/2017  . Substance induced mood disorder (HCC) [F19.94] 02/27/2017  . Cocaine use disorder, severe, dependence (HCC) [F14.20] 01/05/2017  . Cocaine-induced mood disorder (HCC) [F14.94] 01/05/2017  .  Cocaine use disorder, mild, abuse (HCC) [F14.10] 05/03/2016  . Cannabis use disorder, mild, abuse [F12.10] 05/03/2016  . Suicide ideation [R45.851] 04/30/2016  . Paranoid schizophrenia (HCC) [F20.0]   . 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 Alcohol use disorder, moderate, dependence (HCC) [F10.20] 09/22/2015  . Morbid obesity (HCC) [E66.01] 09/22/2015   Total Time spent with patient: 30 minutes  Past Psychiatric History: see H&P  Past Medical History:  Past Medical History:  Diagnosis Date  . Depression   . Obesity   . Schizophrenia (HCC)    History reviewed. No pertinent surgical history. Family History:  Family History  Problem Relation Age of Onset  . Schizophrenia Mother    Family Psychiatric  History: see H&P Social History:  Social History   Substance and Sexual Activity  Alcohol Use No     Social History   Substance and Sexual Activity  Drug Use Yes  . Types: Marijuana, "Crack" cocaine, Cocaine   Comment: "in the past"    Social History   Socioeconomic History  . Marital status: Single    Spouse name: None  . Number of children: None  . Years of education: None  . Highest education level: None  Social Needs  . Financial resource strain: None  . Food insecurity - worry: None  . Food insecurity - inability: None  . Transportation needs - medical: None  . Transportation needs - non-medical: None  Occupational History  . None  Tobacco Use  . Smoking status: Never Smoker  . Smokeless tobacco: Never Used  Substance and Sexual Activity  . Alcohol use: No  . Drug use: Yes    Types: Marijuana, "Crack" cocaine, Cocaine  Comment: "in the past"  . Sexual activity: Not Currently  Other Topics Concern  . None  Social History Narrative  . None   Additional Social History:                         Sleep: Good  Appetite:  Good  Current Medications: Current Facility-Administered Medications  Medication Dose Route Frequency Provider  Last Rate Last Dose  . alum & mag hydroxide-simeth (MAALOX/MYLANTA) 200-200-20 MG/5ML suspension 30 mL  30 mL Oral Q4H PRN Rankin, Shuvon B, NP   30 mL at 08/06/17 0349  . amLODipine (NORVASC) tablet 5 mg  5 mg Oral Daily Armandina StammerNwoko, Agnes I, NP   5 mg at 08/10/17 0956  . benztropine (COGENTIN) tablet 0.5 mg  0.5 mg Oral BID WC Rankin, Shuvon B, NP   0.5 mg at 08/10/17 0957  . hydrOXYzine (ATARAX/VISTARIL) tablet 50 mg  50 mg Oral Q6H PRN Micheal Likensainville, Laakea Pereira T, MD   50 mg at 08/09/17 2116  . ibuprofen (ADVIL,MOTRIN) tablet 600 mg  600 mg Oral Q6H PRN Micheal Likensainville, Augustus Zurawski T, MD   600 mg at 08/09/17 2116  . Influenza vac split quadrivalent PF (FLUARIX) injection 0.5 mL  0.5 mL Intramuscular Tomorrow-1000 Madinah Quarry T, MD      . OLANZapine zydis (ZYPREXA) disintegrating tablet 5 mg  5 mg Oral Q8H PRN Micheal Likensainville, Eamonn Sermeno T, MD      . paliperidone (INVEGA) 24 hr tablet 6 mg  6 mg Oral Daily Oneta RackLewis, Tanika N, NP   6 mg at 08/10/17 [id number]    Lab Results: No results found for this or any previous visit (from the past 48 hour(s)).  Blood Alcohol level:  Lab Results  Component Value Date   ETH <10 08/02/2017   ETH <5 04/02/2017    Metabolic Disorder Labs: Lab Results  Component Value Date   HGBA1C 5.2 10/12/2016   MPG 103 10/12/2016   MPG 108 09/26/2015   Lab Results  Component Value Date   PROLACTIN 63.9 (H) 10/12/2016   PROLACTIN 19.9 (H) 05/06/2016   Lab Results  Component Value Date   CHOL 148 08/05/2017   TRIG 73 08/05/2017   HDL 40 (L) 08/05/2017   CHOLHDL 3.7 08/05/2017   VLDL 15 08/05/2017   LDLCALC 93 08/05/2017   LDLCALC 88 10/12/2016    Physical Findings: AIMS: Facial and Oral Movements Muscles of Facial Expression: None, normal Lips and Perioral Area: None, normal Jaw: None, normal Tongue: None, normal,Extremity Movements Upper (arms, wrists, hands, fingers): None, normal Lower (legs, knees, ankles, toes): None, normal, Trunk Movements Neck, shoulders,  hips: None, normal, Overall Severity Severity of abnormal movements (highest score from questions above): None, normal Incapacitation due to abnormal movements: None, normal Patient's awareness of abnormal movements (rate only patient's report): No Awareness, Dental Status Current problems with teeth and/or dentures?: No Does patient usually wear dentures?: No  CIWA:    COWS:     Musculoskeletal: Strength & Muscle Tone: within normal limits Gait & Station: normal Patient leans: N/A  Psychiatric Specialty Exam: Physical Exam  Nursing note and vitals reviewed.   Review of Systems  Constitutional: Negative for chills and fever.  Respiratory: Negative for cough.   Cardiovascular: Negative for chest pain.  Gastrointestinal: Negative for abdominal pain, heartburn, nausea and vomiting.  Psychiatric/Behavioral: Positive for depression, hallucinations and suicidal ideas. Negative for substance abuse. The patient is nervous/anxious.     Blood pressure 130/82, pulse 74, temperature 98 F (  36.7 C), resp. rate 16, height 5\' 8"  (1.727 m), weight (!) 172.8 kg (381 lb).Body mass index is 57.93 kg/m.  General Appearance: Casual and Fairly Groomed  Eye Contact:  Good  Speech:  Clear and Coherent and Normal Rate  Volume:  Normal  Mood:  Euthymic  Affect:  Appropriate, Congruent and Restricted  Thought Process:  Coherent and Goal Directed  Orientation:  Full (Time, Place, and Person)  Thought Content:  Logical  Suicidal Thoughts:  No  Homicidal Thoughts:  No  Memory:  Immediate;   Fair Recent;   Fair Remote;   Fair  Judgement:  Fair  Insight:  Fair  Psychomotor Activity:  Normal  Concentration:  Concentration: Fair  Recall:  FiservFair  Fund of Knowledge:  Fair  Language:  Good  Akathisia:  No  Handed:    AIMS (if indicated):     Assets:  Communication Skills Leisure Time Physical Health Resilience  ADL's:  Intact  Cognition:  WNL  Sleep:  Number of Hours: 5.5     Treatment Plan  Summary: Daily contact with patient to assess and evaluate symptoms and progress in treatment and Medication management. Pt reports overall improvement of his symptoms but he still has some episodic worsening of AH. He reports SI are at baseline and he has no plan or intent. He is anticipating discharge to outpatient level of care and shelter tomorrow.   -Schizophrenia - Continue invega 6mg  po qDay - Continue Hinda GlatterInvega Sustenna 156mg  q28 days (last given 08/09/17, and initial injection of [place] Sust[place] 234mg  IM once given12/14/18) - Continue zydis 5mg  po q8h prn psychosis  - HTN - Continue norvasc 5mg  qDay -EPS - Continue cogentin 0.5mg  BID - Anxiety -Cotinue atarax 50mg  po q6h prn anxiety or insomnia -Insomnia   -continue atarax 50mg  po q6h ptn anxiety/insomnia  -Encourage participation in groups and the therapeutic milieu -Discharge planning will be ongoing   Micheal Likenshristopher T Jolan Upchurch, MD 08/10/2017, 12:27 PM

## 2017-08-10 NOTE — Progress Notes (Signed)
Nursing Progress Note: 7p-7a D: Pt currently presents with a depressed/flat affect and behavior. Pt states "I hope I get into that rehab facility [facility] told me about. I don't know where to go from here." Interacting appropriately with the milieu. Pt reports good sleep during the previous night with current medication regimen. Pt did attend wrap-up group.  A: Pt provided with medications per providers orders. Pt's labs and vitals were monitored throughout the night. Pt supported emotionally and encouraged to express concerns and questions. Pt educated on medications.  R: Pt's safety ensured with 15 minute and environmental checks. Pt currently denies SI, HI, and VH and endorsed intermittent AH. Pt verbally contracts to seek staff if SI,HI, or AVH occurs and to consult with staff before acting on any harmful thoughts. Will continue to monitor.

## 2017-08-10 NOTE — Progress Notes (Signed)
Patient ID: Bruce [name], male   DOB: [birth year], 41 y.o.   MRN: [id number]  DAR Note: Pt at the time of assessment endorses moderate anxiety with command auditory hallucinations; states, "the voices are telling me to harm myself and others." Pt contracted for safety. Pt is also isolative and withdrawn to self even when in the dayroom. Pt remained calm and cooperative. Medications offered as prescribed.  Support, encouragement, and safe environment provided.  Pt remained med compliant. 15-minute safety checks continue. Pt attended wrap-up group.

## 2017-08-10 NOTE — [facility] Group Notes (Signed)
LCSW Group Therapy Note  08/10/2017 1:15pm  Type of Therapy/Topic:  Group Therapy:  Balance in Life  Participation Level:  Minimal  Description of Group:    This group will address the concept of balance and how it feels and looks when one is unbalanced. Patients will be encouraged to process areas in their lives that are out of balance and identify reasons for remaining unbalanced. Facilitators will guide patients in utilizing problem-solving interventions to address and correct the stressor making their life unbalanced. Understanding and applying boundaries will be explored and addressed for obtaining and maintaining a balanced life. Patients will be encouraged to explore ways to assertively make their unbalanced needs known to significant others in their lives, using other group members and facilitator for support and feedback.  Therapeutic Goals: 1. Patient will identify two or more emotions or situations they have that consume much of in their lives. 2. Patient will identify signs/triggers that life has become out of balance:  3. Patient will identify two ways to set boundaries in order to achieve balance in their lives:  4. Patient will demonstrate ability to communicate their needs through discussion and/or role plays  Summary of Patient Progress:  Stayed the entire time, engaged throughout.  "I feel unbalanced today.  I know this because I am isolating."  I pointed out that he is in group.  He laughed and admitted that maybe he was not as bad off as he was making it out to be.    Therapeutic Modalities:   Cognitive Behavioral Therapy Solution-Focused Therapy Assertiveness Training  Ida RogueRodney B Kiyona Mcnall, KentuckyLCSW 08/10/2017 4:18 PM

## 2017-08-10 NOTE — Progress Notes (Signed)
[place], [place]. Writer introduced self to Pt. Pt is seen in room, resting in bed with eyes open. Pt appears calm and cooperative. Pt had already taken bedtime medications and is preparing to go to sleep. No new c/o.

## 2017-08-11 MED ORDER — PALIPERIDONE ER 6 MG PO TB24
6.0000 mg | ORAL_TABLET | Freq: Every day | ORAL | 0 refills | Status: DC
Start: 2017-08-12 — End: 2018-06-16

## 2017-08-11 MED ORDER — AMLODIPINE BESYLATE 5 MG PO TABS: 5.0000 mg | ORAL_TABLET | Freq: Every day | ORAL | 0 refills | Status: DC

## 2017-08-11 MED ORDER — HYDROXYZINE HCL 50 MG PO TABS: 50.0000 mg | ORAL_TABLET | Freq: Four times a day (QID) | ORAL | 0 refills | Status: DC | PRN

## 2017-08-11 MED ORDER — BENZTROPINE MESYLATE 0.5 MG PO TABS: 0.5000 mg | ORAL_TABLET | Freq: Two times a day (BID) | ORAL | 0 refills | Status: DC

## 2017-08-11 NOTE — Progress Notes (Signed)
Recreation Therapy Notes  Date: 08/11/17 Time: 1000 Location: 500 Hall Dayroom   Group Topic: Communication, Team Building, Problem Solving  Goal Area(s) Addresses:  Patient will effectively work with peer towards shared goal.  Patient will identify skill used to make activity successful.  Patient will identify how skills used during activity can be used to reach post d/c goals.   Behavioral Response: None  Intervention: STEM Activity   Activity: [place]. Patients were provided the following materials: 5 drinking straws, 5 rubber bands, 5 paper clips, 2 index cards, 2 drinking cups, and 2 toilet paper rolls. Using the provided materials patients were asked to build [name] launching mechanisms to launch [name] ping pong ball approximately 12 feet. Patients were divided into teams of 3-5.   Education: Pharmacist, communityocial Skills, Building control surveyorDischarge Planning.   Education Outcome: Acknowledges education/In group clarification offered/Needs additional education.   Clinical Observations/Feedback:  Pt did not participate.  Pt sat and observed.    Caroll RancherMarjette Airyanna [name], LRT/CTRS     Caroll RancherLindsay, Bruce [name] 08/11/2017 12:27 PM

## 2017-08-11 NOTE — Discharge Summary (Signed)
Physician Discharge Summary Note  Patient:  Bruce [name] is an 41 y.o., male MRN:  [id number] DOB:  1976-05-29 Patient phone:  [phone] (home)  Patient address:   [address] [place] [address],  Total Time spent with patient: Greater than 30 minutes  Date of Admission:  08/03/2017  Date of Discharge: 08/11/2017  Reason for Admission: Auditory hallucinations telling him to kill himself.  Principal Problem: Schizoaffective disorder, depressive type [facility](HCC)  Discharge Diagnoses: Patient Active Problem List   Diagnosis Date Noted  . Paranoid schizophrenia (HCC) [F20.0]     Priority: High  . Schizoaffective disorder, depressive type (HCC) [F25.1] 08/04/2017  . 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 Polysubstance abuse (HCC) [F19.10] 02/27/2017  . Auditory hallucinations [R44.0] 02/27/2017  . Substance induced mood disorder (HCC) [F19.94] 02/27/2017  . Cocaine use disorder, severe, dependence (HCC) [F14.20] 01/05/2017  . Cocaine-induced mood disorder (HCC) [F14.94] 01/05/2017  . Cocaine use disorder, mild, abuse (HCC) [F14.10] 05/03/2016  . Cannabis use disorder, mild, abuse [F12.10] 05/03/2016  . Suicide ideation [R45.851] 04/30/2016  . 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 Alcohol use disorder, moderate, dependence (HCC) [F10.20] 09/22/2015  . Morbid obesity (HCC) [E66.01] 09/22/2015   Past Psychiatric History: Hx. Paranoid Schizophrenia.  Past Medical History:  Past Medical History:  Diagnosis Date  . Depression   . Obesity   . Schizophrenia (HCC)    History reviewed. No pertinent surgical history.  Family History:  Family History  Problem Relation Age of Onset  . Schizophrenia Mother    Family Psychiatric  History: See H&P Social History:  Social History   Substance and Sexual Activity  Alcohol Use No     Social History   Substance and Sexual Activity  Drug Use Yes  . Types: Marijuana, "Crack" cocaine,  Cocaine   Comment: "in the past"    Social History   Socioeconomic History  . Marital status: Single    Spouse name: None  . Number of children: None  . Years of education: None  . Highest education level: None  Social Needs  . Financial resource strain: None  . Food insecurity - worry: None  . Food insecurity - inability: None  . Transportation needs - medical: None  . Transportation needs - non-medical: None  Occupational History  . None  Tobacco Use  . Smoking status: Never Smoker  . Smokeless tobacco: Never Used  Substance and Sexual Activity  . Alcohol use: No  . Drug use: Yes    Types: Marijuana, "Crack" cocaine, Cocaine    Comment: "in the past"  . Sexual activity: Not Currently  Other Topics Concern  . None  Social History Narrative  . None   Hospital Course: (Per admission assessment): This is one of several admission assessment in this hospital alone for Bruce [name],  a 41 year old African-American male with hx of chronic mental illness. He is known in this hospital from previous admissions for mood stabilization treatments. [facility] with complaints of auditory hallucinations (demonic voices) telling him to kill himself & others. He has hx of multiple suicide attempts by different means. He has an Act Team who is over seeing his mental health care on an outpatient basis.   During this assessment, Bruce [name] reports, "[facility], 2 days ago because I was feeling suicidal & attempted to jump off a bridge to kill myself. I have been feeling suicidal since  my 20's, but it worsened in the last 2 weeks. I have been taking my medicines. I think the medicines are no longer working because my body is immune to them. I keep hearing demonic voices telling me to kill myself & other people. I have attempted suicide x 10; by overdose, cutting my writs & jumping off a bridge.  I have been stressed out, feeling  worthless for a while. Things have been working badly against me. I have trouble finding a job & a place to live. I have been homeless x 7 years. I have been depressed for a long time.  After evaluation of his presenting symptoms, it was determined that Bruce [name] will need mood stabilization treatments. The medication regimen for his presenting symptoms were discussed & initiated. He was medicated & discharged on, Cogentin 0.5 mg for EPS, Vistaril 50 mg prn for anxiety & Invega 6 mg for mood control. He was enrolled & participated in the group counseling sessions being offered & held on this unit. He learned coping skills. He received other medication regimen for the other medical issues provided. He tolerated his treatment regimen without any adverse effects reported.  Upon his discharge evaluation today by the attending psychiatrist, pt reports he is doing well overall. He reports AH are "quiet today" and he denies VH. He reports SI "still there but I'm not going to do anything" and he denies HI. He had some anxiety about his roommate and so he was switched to a private room in a different hall overnight, and that resolved his concerns. Pt is future oriented about discharge, outpatient follow up, and participating in the [facility] Program. He is able to engage in safety planning including plan to [facility] or contact emergency services if he feels unable to maintain his own safety or the safety of others. Pt had no further questions, comments, or concerns.   [facility] with all personal belongings in no apparent distress. Transportation per [facility].  Physical Findings: AIMS: Facial and Oral Movements Muscles of Facial Expression: None, normal Lips and Perioral Area: None, normal Jaw: None, normal Tongue: None, normal,Extremity Movements Upper (arms, wrists, hands, fingers): None, normal Lower (legs, knees, ankles, toes): None, normal, Trunk Movements Neck, shoulders, hips: None, normal,  Overall Severity Severity of abnormal movements (highest score from questions above): None, normal Incapacitation due to abnormal movements: None, normal Patient's awareness of abnormal movements (rate only patient's report): No Awareness, Dental Status Current problems with teeth and/or dentures?: No Does patient usually wear dentures?: No  CIWA:  CIWA-Ar Total: 1 COWS:  COWS Total Score: 2  Musculoskeletal: Strength & Muscle Tone: within normal limits Gait & Station: normal Patient leans: N/A  Psychiatric Specialty Exam: Physical Exam  Nursing note and vitals reviewed. Constitutional: He appears well-developed.  HENT:  Head: Normocephalic.  Eyes: Pupils are equal, round, and reactive to light.  Neck: Normal range of motion.  Cardiovascular: Normal rate.  Respiratory: Effort normal.  GI: Soft.  Genitourinary:  Genitourinary Comments: Deferred  Musculoskeletal: Normal range of motion.  Neurological: He is alert.  Skin: Skin is warm.    Review of Systems  Constitutional: Negative.   HENT: Negative.   Eyes: Negative.   Respiratory: Negative.   Cardiovascular: Negative.   Gastrointestinal: Negative.   Musculoskeletal: Negative.   Skin: Negative.   Endo/Heme/Allergies: Negative.   Psychiatric/Behavioral: Positive for depression (Stabe), hallucinations (Hx. psychosis) and substance abuse (Hx. Cocaine use disorder). Negative for memory loss and suicidal ideas. The  patient has insomnia (Stable). The patient is not nervous/anxious.     Blood pressure 126/87, pulse 92, temperature 98.2 F (36.8 C), resp. rate 16, height 5\' 8"  (1.727 m), weight (!) 172.8 kg (381 lb).Body mass index is 57.93 kg/m.   Have you used any form of tobacco in the last 30 days? (Cigarettes, Smokeless Tobacco, Cigars, and/or Pipes): No  Has this patient used any form of tobacco in the last 30 days? (Cigarettes, Smokeless Tobacco, Cigars, and/or Pipes) : N/A  Blood Alcohol level:  Lab Results   Component Value Date   ETH <10 08/02/2017   ETH <5 04/02/2017   Metabolic Disorder Labs:  Lab Results  Component Value Date   HGBA1C 5.2 10/12/2016   MPG 103 10/12/2016   MPG 108 09/26/2015   Lab Results  Component Value Date   PROLACTIN 63.9 (H) 10/12/2016   PROLACTIN 19.9 (H) 05/06/2016   Lab Results  Component Value Date   CHOL 148 08/05/2017   TRIG 73 08/05/2017   HDL 40 (L) 08/05/2017   CHOLHDL 3.7 08/05/2017   VLDL 15 08/05/2017   LDLCALC 93 08/05/2017   LDLCALC 88 10/12/2016   See Psychiatric Specialty Exam and Suicide Risk Assessment completed by Attending Physician prior to discharge.  Discharge destination:  Home  Is patient on multiple antipsychotic therapies at discharge:  No   Has Patient had three or more failed trials of antipsychotic monotherapy by history:  No  Recommended Plan for Multiple Antipsychotic Therapies: NA  Allergies as of 08/11/2017   No Known Allergies     Medication List    STOP taking these medications   acetaminophen 500 MG tablet Commonly known as:  TYLENOL   INVEGA SUSTENNA IM   traZODone 150 MG tablet Commonly known as:  DESYREL     TAKE these medications     Indication  amLODipine 5 MG tablet Commonly known as:  NORVASC Take 1 tablet (5 mg total) by mouth daily. For high blood pressure What changed:  additional instructions  Indication:  High Blood Pressure Disorder   benztropine 0.5 MG tablet Commonly known as:  COGENTIN Take 1 tablet (0.5 mg total) by mouth 2 (two) times daily with a meal. For prevention of drug induced trmors What changed:  additional instructions  Indication:  Extrapyramidal Reaction caused by Medications   hydrOXYzine 50 MG tablet Commonly known as:  ATARAX/VISTARIL Take 1 tablet (50 mg total) by mouth every 6 (six) hours as needed for anxiety (sleep). What changed:    medication strength  how much to take  reasons to take this  Indication:  Feeling Anxious   paliperidone 6 MG  24 hr tablet Commonly known as:  INVEGA Take 1 tablet (6 mg total) by mouth daily. For mood control Start taking on:  08/12/2017  Indication:  Mood control      Follow-up Information    [facility] Follow up on 08/12/2017.   Why:  Friday at 10:15 for you hospital follow up appointment.  Bring along ID and hospital d/c paperwork Contact information: [address] [place] [address] [phone]          Follow-up recommendations: Activity:  As tolerated Diet: As recommended by your primary care doctor. Keep all scheduled follow-up appointments as recommended.  Comments: Patient is instructed prior to discharge to: Take all medications as prescribed by his/her mental healthcare provider. Report any adverse effects and or reactions from the medicines to his/her outpatient provider promptly. Patient has been instructed & cautioned:  To not engage in alcohol and or illegal drug use while on prescription medicines. In the event of worsening symptoms, patient is instructed to call the crisis hotline, 911 and or go to the nearest ED for appropriate evaluation and treatment of symptoms. To follow-up with his/her primary care provider for your other medical issues, concerns and or health care needs.    Signed: Armandina Stammer, NP [facility], FNP-BC 08/11/2017, 11:12 AM   Patient seen, Suicide Assessment Completed.  Disposition Plan Reviewed   Bruce [name] is a 41 y/o M with history of schizoaffective depressive type who was admitted voluntarily with worsening depression, AH of demonic voice commanding him to hurt himself and others, SI with plan to jump from a bridge, poor medication adherence, and illicit substance use of cocaine.Pt was restarted on Invega oral formulationandtransitionedto Sustennawhich he received first injection of 234mg  on 08/05/17 and booster injection of 156mg  on12/18/18. His symptoms of AH improved and returned to baseline as well as SI which are chronic.  Upon  evaluationtoday, pt reports he is doing well overall. He reports AH are "quiet today" and he denies VH. He reports SI "still there but I'm not going to do anything" and he denies HI. He had some anxiety about his roommate and so he was switched to a private room in a different hall overnight, and that resolved his concerns. Pt is future oriented about discharge, outpatient follow up, and participating in the Ready [facility] Program. He is able to engage in safety planning including plan to return to [facility] or contact emergency services if he feels unable to maintain his own safety or the safety of others. Pt had no further questions, comments, or concerns.   Plan Of Care/Follow-up recommendations:   -Discharge to outpatient level of care  -Schizophrenia - Continue invega 6mg  po qDay -Continue Invega Sustenna 156mg  q28 days (last given 08/09/17, and initial injection ofInvega Sustenna 234mg  IM once given12/14/18)  - HTN - Continue norvasc 5mg  qDay -EPS - Continue cogentin 0.5mg  BID - Anxiety -Cotinue atarax 50mg  po q6h prn anxiety or insomnia  Activity:  as tolerated Diet:  normal Tests:  NA Other:  see above for DC plan  Micheal Likens, MD

## 2017-08-11 NOTE — Progress Notes (Signed)
Patient's self inventory sheet, patient has fair sleep, sleep medication helpful.  Good appetite, normal energy level, good concentration.  Rated depression and hopeless 6, anxiety 5.  Denied withdrawals.  SI, contracts for safety, no plan.  Physical problems, pain, tooth, worst pain in past 24 hours #5.  Pain medication helpful.  Does have discharge plans.

## 2017-08-11 NOTE — [facility] Suicide Risk Assessment (Signed)
[facility]   Principal Problem: Schizoaffective disorder, depressive type [facility](HCC) Discharge Diagnoses:  Patient Active Problem List   Diagnosis Date Noted  . Schizoaffective disorder, depressive type (HCC) [F25.1] 08/04/2017  . 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 Polysubstance abuse (HCC) [F19.10] 02/27/2017  . Auditory hallucinations [R44.0] 02/27/2017  . Substance induced mood disorder (HCC) [F19.94] 02/27/2017  . Cocaine use disorder, severe, dependence (HCC) [F14.20] 01/05/2017  . Cocaine-induced mood disorder (HCC) [F14.94] 01/05/2017  . Cocaine use disorder, mild, abuse (HCC) [F14.10] 05/03/2016  . Cannabis use disorder, mild, abuse [F12.10] 05/03/2016  . Suicide ideation [R45.851] 04/30/2016  . Paranoid schizophrenia (HCC) [F20.0]   . 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 Alcohol use disorder, moderate, dependence (HCC) [F10.20] 09/22/2015  . Morbid obesity (HCC) [E66.01] 09/22/2015    Total Time spent with patient: 30 minutes  Musculoskeletal: Strength & Muscle Tone: within normal limits Gait & Station: normal Patient leans: N/A  Psychiatric Specialty Exam: Review of Systems  Constitutional: Negative for chills and fever.  Respiratory: Negative for cough.   Cardiovascular: Negative for chest pain and palpitations.  Gastrointestinal: Negative for abdominal pain, heartburn, nausea and vomiting.  Psychiatric/Behavioral: Positive for hallucinations and suicidal ideas. Negative for depression.    Blood pressure 126/87, pulse 92, temperature 98.2 F (36.8 C), resp. rate 16, height 5\' 8"  (1.727 m), weight (!) 172.8 kg (381 lb).Body mass index is 57.93 kg/m.  General Appearance: Casual and Fairly Groomed  Patent attorneyye Contact::  Good  Speech:  Clear and Coherent and Normal Rate  Volume:  Normal  Mood:  Euthymic  Affect:  Appropriate, Congruent and Flat  Thought Process:  Coherent and Goal Directed  Orientation:   Full (Time, Place, and Person)  Thought Content:  Hallucinations: Auditory  Suicidal Thoughts:  Yes.  without intent/plan  Homicidal Thoughts:  No  Memory:  Immediate;   Good Recent;   Good Remote;   Good  Judgement:  Intact  Insight:  Fair  Psychomotor Activity:  Normal  Concentration:  Fair  Recall:  FiservFair  Fund of Knowledge:Fair  Language: Fair  Akathisia:  No  Handed:    AIMS (if indicated):     Assets:  Manufacturing systems engineerCommunication Skills Physical Health Resilience Social Support  Sleep:  Number of Hours: 5.25  Cognition: WNL  ADL's:  Intact   Mental Status Per Nursing Assessment::   On Admission:     Demographic Factors:  Male, Low socioeconomic status, Living alone and Unemployed  Loss Factors: Financial problems/change in socioeconomic status  Historical Factors: Impulsivity  Risk Reduction Factors:   Positive social support, Positive therapeutic relationship and Positive coping skills or problem solving skills  Continued Clinical Symptoms:  Schizophrenia:   Paranoid or undifferentiated type  Cognitive Features That Contribute To Risk:  None    Suicide Risk:  Mild:  Suicidal ideation of limited frequency, intensity, duration, and specificity.  There are no identifiable plans, no associated intent, mild dysphoria and related symptoms, good self-control (both objective and subjective assessment), few other risk factors, and identifiable protective factors, including available and accessible social support.  Follow-up Information    [facility] Follow up on 08/12/2017.   Why:  Friday at 10:15 for you hospital follow up appointment.  Bring along ID and hospital d/c paperwork Contact information: [address] [place] [address] [phone]         Subjective data: Bruce OrganDwight Kackley is a [age] y/o M with history of schizoaffective depressive type who was admitted voluntarily with  worsening depression, AH of demonic voice commanding him to hurt himself and others, SI with  plan to jump from a bridge, poor medication adherence, and illicit substance use of cocaine.Pt was restarted on Invega oral formulationandtransitionedto Sustennawhich he received first injection of 234mg  on 08/05/17 and booster injection of 156mg  on 08/09/17. His symptoms of AH improved and returned to baseline as well as SI which are chronic.  Upon evaluationtoday, pt reports he is doing well overall. He reports AH are "quiet today" and he denies VH. He reports SI "still there but I'm not going to do anything" and he denies HI. He had some anxiety about his roommate and so he was switched to a private room in a different hall overnight, and that resolved his concerns. Pt is future oriented about discharge, outpatient follow up, and participating in the Ready [facility] Change Program. He is able to engage in safety planning including [facility] or contact emergency services if he feels unable to maintain his own safety or the safety of others. Pt had no further questions, comments, or concerns.   Plan Of Care/Follow-up recommendations:   -Discharge to outpatient level of care  -Schizophrenia - Continue invega 6mg  po qDay - Continue Hinda GlatterInvega Sustenna 156mg  q28 days (last given 08/09/17, and initial injection of [place]vega Sustenna 234mg  IM once given12/14/18)  - HTN - Continue norvasc 5mg  qDay -EPS - Continue cogentin 0.5mg  BID - Anxiety -Cotinue atarax 50mg  po q6h prn anxiety or insomnia  Activity:  as tolerated Diet:  normal Tests:  NA Other:  see above for DC plan  Micheal Likenshristopher T Ayano Douthitt, MD 08/11/2017, 10:14 AM

## 2017-08-11 NOTE — Progress Notes (Signed)
D:  Patient stated he does have SI and HI thoughts, contracts for safety, no plans.  Stated he does see demons and voices tell him to hurt himself and others. A:  Medications administered per MD orders.  Emotional support and encouragement given patient. R:  Safety maintained with 15 minute checks.  Patient has been programming on 500 hall.

## 2017-08-11 NOTE — Progress Notes (Signed)
  [facility] Adult [facility] Management Discharge Plan :  Will you be returning to the same living situation after discharge:  No. shelter At discharge, do you have transportation home?: Yes,  TCT Do you have the ability to pay for your medications: Yes,  [facility]  Release of information consent forms completed and submitted to medical records by CSW.  Patient to Follow up at: Follow-up Information    [facility] Follow up on 08/12/2017.   Why:  Friday at 10:15 for you hospital follow up appointment.  Bring along ID and hospital d/c paperwork Contact information: [address] [place] [address] [phone]           Next level of care provider has access to [facility] Link:no  Safety Planning and Suicide Prevention discussed: Yes,  SPE completed with pt; pt declined to consent to family contact. SPI pamphlet and Mobile Crisis information provided to pt.   Have you used any form of tobacco in the last 30 days? (Cigarettes, Smokeless Tobacco, Cigars, and/or Pipes): No  Has patient been referred to the Quitline?: N/A patient is not a smoker  Patient has been referred for addiction treatment: Yes  Pulte HomesHeather N Smart, LCSW 08/11/2017, 8:46 AM

## 2017-08-11 NOTE — Progress Notes (Signed)
Discharge Note:  [facility] and is [facility].  Patient denied SI and HI thoughts at this time.  Denied A/V hallucinations during discharge.  Suicide prevention information given and discussed with patient who stated he understood and had no questions.  Patient stated he received all his belongings, clothing, toiletries, misc items, prescriptions, etc.  Patient stated he [facility].  All required discharge information given to patient at discharge.

## 2017-11-07 ENCOUNTER — Other Ambulatory Visit: Payer: Self-pay

## 2017-11-07 ENCOUNTER — Encounter (HOSPITAL_COMMUNITY): Payer: Self-pay | Admitting: Emergency Medicine

## 2017-11-07 DIAGNOSIS — R440 Auditory hallucinations: Secondary | ICD-10-CM | POA: Insufficient documentation

## 2017-11-07 DIAGNOSIS — R45850 Homicidal ideations: Secondary | ICD-10-CM | POA: Insufficient documentation

## 2017-11-07 DIAGNOSIS — R45851 Suicidal ideations: Secondary | ICD-10-CM | POA: Diagnosis not present

## 2017-11-07 DIAGNOSIS — Z79899 Other long term (current) drug therapy: Secondary | ICD-10-CM | POA: Insufficient documentation

## 2017-11-07 DIAGNOSIS — R441 Visual hallucinations: Secondary | ICD-10-CM | POA: Insufficient documentation

## 2017-11-07 DIAGNOSIS — F329 Major depressive disorder, single episode, unspecified: Secondary | ICD-10-CM | POA: Diagnosis not present

## 2017-11-07 DIAGNOSIS — F17210 Nicotine dependence, cigarettes, uncomplicated: Secondary | ICD-10-CM | POA: Insufficient documentation

## 2017-11-07 DIAGNOSIS — Z046 Encounter for general psychiatric examination, requested by authority: Secondary | ICD-10-CM | POA: Diagnosis not present

## 2017-11-07 LAB — COMPREHENSIVE METABOLIC PANEL
ALT: 14 U/L — AB (ref 17–63)
AST: 20 U/L (ref 15–41)
Albumin: 3.3 g/dL — ABNORMAL LOW (ref 3.5–5.0)
Alkaline Phosphatase: 69 U/L (ref 38–126)
Anion gap: 10 (ref 5–15)
BUN: 11 mg/dL (ref 6–20)
CHLORIDE: 103 mmol/L (ref 101–111)
CO2: 26 mmol/L (ref 22–32)
Calcium: 9.2 mg/dL (ref 8.9–10.3)
Creatinine, Ser: 0.84 mg/dL (ref 0.61–1.24)
GFR calc Af Amer: >60 mL/min (ref >60)
Glucose, Bld: 95 mg/dL (ref 65–99)
Potassium: 3.9 mmol/L (ref 3.5–5.1)
SODIUM: 139 mmol/L (ref 135–145)
Total Bilirubin: 0.5 mg/dL (ref 0.3–1.2)
Total Protein: 7.8 g/dL (ref 6.5–8.1)

## 2017-11-07 LAB — CBC
HCT: 37.8 % — ABNORMAL LOW (ref 39.0–52.0)
Hemoglobin: 12.1 g/dL — ABNORMAL LOW (ref 13.0–17.0)
MCH: 29.1 pg (ref 26.0–34.0)
MCHC: 32 g/dL (ref 30.0–36.0)
MCV: 90.9 fL (ref 78.0–100.0)
PLATELETS: 241 10*3/uL (ref 150–400)
RBC: 4.16 MIL/uL — AB (ref 4.22–5.81)
RDW: 14.6 % (ref 11.5–15.5)
WBC: 8.3 10*3/uL (ref 4.0–10.5)

## 2017-11-07 LAB — ACETAMINOPHEN LEVEL: Acetaminophen (Tylenol), Serum: <10 ug/mL — ABNORMAL LOW (ref 10–30)

## 2017-11-07 LAB — ETHANOL

## 2017-11-07 LAB — SALICYLATE LEVEL

## 2017-11-07 NOTE — ED Triage Notes (Signed)
Pt reports "demons" in his head for several weeks telling him to cut people's heads off. States no PO psych meds at this time, just iM injection monthly.

## 2017-11-07 NOTE — ED Notes (Signed)
Security notified to wand patient. Staffing notified to send sitter.

## 2017-11-08 ENCOUNTER — Other Ambulatory Visit: Payer: Self-pay

## 2017-11-08 ENCOUNTER — Emergency Department (HOSPITAL_COMMUNITY)
Admission: EM | Admit: 2017-11-08 | Discharge: 2017-11-09 | Disposition: A | Discharge status: Home / Self Care | Payer: Medicaid Other | Attending: Emergency Medicine | Admitting: Emergency Medicine

## 2017-11-08 DIAGNOSIS — F1490 Cocaine use, unspecified, uncomplicated: Secondary | ICD-10-CM

## 2017-11-08 DIAGNOSIS — Z818 Family history of other mental and behavioral disorders: Secondary | ICD-10-CM

## 2017-11-08 DIAGNOSIS — F251 Schizoaffective disorder, depressive type: Secondary | ICD-10-CM | POA: Diagnosis not present

## 2017-11-08 DIAGNOSIS — F1290 Cannabis use, unspecified, uncomplicated: Secondary | ICD-10-CM | POA: Diagnosis not present

## 2017-11-08 DIAGNOSIS — R443 Hallucinations, unspecified: Secondary | ICD-10-CM

## 2017-11-08 LAB — RAPID URINE DRUG SCREEN, HOSP PERFORMED
Amphetamines: NOT DETECTED
BARBITURATES: NOT DETECTED
BENZODIAZEPINES: NOT DETECTED
Cocaine: NOT DETECTED
Opiates: NOT DETECTED
Tetrahydrocannabinol: POSITIVE — AB

## 2017-11-08 MED ORDER — IBUPROFEN 200 MG PO TABS: 200.0000 mg | ORAL_TABLET | Freq: Four times a day (QID) | ORAL | Status: DC | PRN

## 2017-11-08 MED ORDER — AMLODIPINE BESYLATE 5 MG PO TABS
5.0000 mg | ORAL_TABLET | Freq: Every day | ORAL | Status: DC
  Administered 2017-11-08: 5 mg via ORAL
  Filled 2017-11-08: qty 1

## 2017-11-08 MED ORDER — HYDROXYZINE HCL 50 MG PO TABS
50.0000 mg | ORAL_TABLET | Freq: Four times a day (QID) | ORAL | Status: DC | PRN
  Administered 2017-11-08: 50 mg via ORAL
  Filled 2017-11-08: qty 1

## 2017-11-08 MED ORDER — PALIPERIDONE ER 6 MG PO TB24
6.0000 mg | ORAL_TABLET | Freq: Every day | ORAL | Status: DC
  Administered 2017-11-08: 6 mg via ORAL
  Filled 2017-11-08 (×3): qty 1

## 2017-11-08 MED ORDER — BENZTROPINE MESYLATE 1 MG PO TABS
0.5000 mg | ORAL_TABLET | Freq: Two times a day (BID) | ORAL | Status: DC
  Administered 2017-11-08: 0.5 mg via ORAL
  Filled 2017-11-08: qty 1

## 2017-11-08 NOTE — ED Notes (Signed)
Pt ambulatory to Pod F11 w/Sitter. Pt noted to be wearing hospital gown. Pt noted to be calm, cooperative.

## 2017-11-08 NOTE — ED Notes (Signed)
Pt states he is "hearing voices real bad," endorses AH worse than it has been, requested meds. Contacting EDP.

## 2017-11-08 NOTE — ED Provider Notes (Signed)
[facility] EMERGENCY DEPARTMENT Provider Note   CSN: [id number] Arrival date & time: 11/07/17  8413     History   Chief Complaint Chief Complaint  Patient presents with  . Suicidal  . Homicidal    HPI Bruce [name] is a 42 y.o. male.  The history is provided by the patient and medical records.    42 year old male with history of depression, obesity, schizophrenia, presenting to the ED with hallucinations.  Patient reports over the past few weeks he has been experiencing a lot of auditory and visual hallucinations.  States he sees demons and levels and often hears their voices.  States they give him commands like "just kill yourself, it will be easy" or tell him to hurt other people.  Patient states because of this he did jump off of a bridge several weeks ago and last week tried to overdose on over-the-counter medications without any effect.  States he does have a history of seeing things and hearing these voices in the past, reports when he was switched to his IM injections of his medications he seemed to be doing better but symptoms have slowly started to recur.  He has not followed by psychiatry at the present time.  Past Medical History:  Diagnosis Date  . Depression   . Obesity   . Schizophrenia [facility])     Patient Active Problem List   Diagnosis Date Noted  . Schizoaffective disorder, depressive type (HCC) 08/04/2017  . 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 Polysubstance abuse (HCC) 02/27/2017  . Auditory hallucinations 02/27/2017  . Substance induced mood disorder (HCC) 02/27/2017  . Cocaine use disorder, severe, dependence (HCC) 01/05/2017  . 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 Cocaine use disorder, mild, abuse (HCC) 05/03/2016  . Cannabis use disorder, mild, abuse 05/03/2016  . Suicide ideation 04/30/2016  . Paranoid schizophrenia (HCC)   . Hyperprolactinemia (HCC) 09/24/2015  . Alcohol use disorder, moderate,  dependence (HCC) 09/22/2015  . Morbid obesity (HCC) 09/22/2015    History reviewed. No pertinent surgical history.     Home Medications    Prior to Admission medications   Medication Sig Start Date End Date Taking? Authorizing Provider  amLODipine (NORVASC) 5 MG tablet Take 1 tablet (5 mg total) by mouth daily. For high blood pressure 08/11/17 08/11/18  Armandina Stammer I, NP  benztropine (COGENTIN) 0.5 MG tablet Take 1 tablet (0.5 mg total) by mouth 2 (two) times daily with a meal. For prevention of drug induced trmors 08/11/17   Armandina Stammer I, NP  hydrOXYzine (ATARAX/VISTARIL) 50 MG tablet Take 1 tablet (50 mg total) by mouth every 6 (six) hours as needed for anxiety (sleep). 08/11/17   Armandina Stammer I, NP  paliperidone (INVEGA) 6 MG 24 hr tablet Take 1 tablet (6 mg total) by mouth daily. For mood control 08/12/17   Armandina Stammer I, NP    Family History Family History  Problem Relation Age of Onset  . Schizophrenia Mother     Social History Social History   Tobacco Use  . Smoking status: Current Every Day Smoker    Packs/day: 0.50    Types: Cigarettes  . Smokeless tobacco: Never Used  Substance Use Topics  . Alcohol use: No  . Drug use: Yes    Types: Marijuana, "Crack" cocaine, Cocaine    Comment: "in the past"     Allergies   Patient has no known allergies.   Review of Systems Review of Systems  Psychiatric/Behavioral: Positive for hallucinations.  All other systems reviewed and are negative.    Physical Exam Updated Vital Signs BP (!) 150/90 (BP Location: Right Arm)   Pulse 100   Temp 98.5 F (36.9 C) (Oral)   Resp 20   Ht 5\' 8"  (1.727 m)   Wt (!) 163.3 kg (360 lb)   SpO2 98%   BMI 54.74 kg/m   Physical Exam  Constitutional: He is oriented to person, place, and time. He appears well-developed and well-nourished.  Morbidly obese  HENT:  Head: Normocephalic and atraumatic.  Mouth/Throat: Oropharynx is clear and moist.  Eyes: Conjunctivae and EOM are  normal. Pupils are equal, round, and reactive to light.  Neck: Normal range of motion.  Cardiovascular: Normal rate, regular rhythm and normal heart sounds.  Pulmonary/Chest: Effort normal and breath sounds normal. No stridor. No respiratory distress.  Abdominal: Soft. Bowel sounds are normal. There is no tenderness. There is no rebound.  Musculoskeletal: Normal range of motion.  Neurological: He is alert and oriented to person, place, and time.  Skin: Skin is warm and dry.  Psychiatric: He has a normal mood and affect. He is actively hallucinating.  Hallucinating demons/devils, they give him commands Reports SI/HI from the voices  Nursing note and vitals reviewed.    ED Treatments / Results  Labs (all labs ordered are listed, but only abnormal results are displayed) Labs Reviewed  COMPREHENSIVE METABOLIC PANEL - Abnormal; Notable for the following components:      Result Value   Albumin 3.3 (*)    ALT 14 (*)    All other components within normal limits  ACETAMINOPHEN LEVEL - Abnormal; Notable for the following components:   Acetaminophen (Tylenol), Serum <10 (*)    All other components within normal limits  CBC - Abnormal; Notable for the following components:   RBC 4.16 (*)    Hemoglobin 12.1 (*)    HCT 37.8 (*)    All other components within normal limits  ETHANOL  SALICYLATE LEVEL  RAPID URINE DRUG SCREEN, HOSP PERFORMED    EKG  EKG Interpretation None       Radiology No results found.  Procedures Procedures (including critical care time)  Medications Ordered in ED Medications - No data to display   Initial Impression / Assessment and Plan / ED Course  I have reviewed the triage vital signs and the nursing notes.  Pertinent labs & imaging results that were available during my care of the patient were reviewed by me and considered in my medical decision making (see chart for details).  42 year old male presenting to the ED with hallucinations.  States he  is seeing demons/devils that are telling him to hurt himself/others.  States he did try to jump off a bridge a few weeks ago and tried to OD on OTC meds last week.  He has no physical complaints at this time.  Screening labs are overall reassuring.  Patient medically cleared.  TTS has evaluated patient, recommends observation and repeat assessment by psychiatry in the morning to determine disposition.  Final Clinical Impressions(s) / ED Diagnoses   Final diagnoses:  Hallucinations    ED Discharge Orders    None       Garlon HatchetSanders, Joyceann Kruser M, PA-C 11/08/17 0715    Shon BatonHorton, Courtney F, MD 11/09/17 [phone]

## 2017-11-08 NOTE — ED Notes (Signed)
Pt on phone at nurses' desk. 

## 2017-11-08 NOTE — BH Assessment (Signed)
Attempted multiple times to contact pt's nurse with no success. Pt is located in the hallway. Reached a nurse in the area who agreed to assist with putting pt in a room and TA equipment in room. Trying to call TA equipment as agreed.. Equipment not ringing at all as if not turned on . No number to reach nurse at this time to check. Will attempt assessment at another time.
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
## 2017-11-08 NOTE — ED Notes (Addendum)
Pt states he has a facility that he can go to tomorrow (11/09/17) - may arrive there between 10a-2p. RN had removed valuables from Security so pt may retrieve paper that has phone numbers on it. Envelope [facility] in [facility] w/pt's other belongings d/t pt being d/c'd in am.

## 2017-11-08 NOTE — ED Notes (Signed)
TTS at bedside. 

## 2017-11-08 NOTE — ED Notes (Signed)
All belongings inventoried and locked up in security and [facility] in [facility]

## 2017-11-08 NOTE — ED Notes (Signed)
Outpatient referrals given to pt

## 2017-11-08 NOTE — ED Notes (Signed)
TTS with pt  

## 2017-11-08 NOTE — ED Notes (Signed)
Pt aware will be d/c'd in am. Voiced understanding and agreement w/tx plan.
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
## 2017-11-08 NOTE — Progress Notes (Signed)
Pt is psychiatrically cleared and appropriate for discharge per Leighton Ruffina Okonkwo, FNP. CSW informed Neldon Labellammy, [facility] and sent outpatient referral information for pt to review.   Wells GuilesSarah Jabri Blancett, LCSW, LCAS Disposition CSW [facility] [phone]

## 2017-11-08 NOTE — ED Notes (Signed)
Patient was given a snack and drink. A Regular Diet was ordered for Dinner. 

## 2018-06-07 ENCOUNTER — Emergency Department (HOSPITAL_COMMUNITY)
Admission: EM | Admit: 2018-06-07 | Discharge: 2018-06-09 | Disposition: A | Discharge status: Other Acute Inpatient Hospital | Payer: Medicaid Other | Attending: Emergency Medicine | Admitting: Emergency Medicine

## 2018-06-07 ENCOUNTER — Encounter (HOSPITAL_COMMUNITY): Payer: Self-pay | Admitting: *Deleted

## 2018-06-07 ENCOUNTER — Emergency Department (HOSPITAL_COMMUNITY): Payer: Medicaid Other

## 2018-06-07 DIAGNOSIS — Z79899 Other long term (current) drug therapy: Secondary | ICD-10-CM | POA: Diagnosis not present

## 2018-06-07 DIAGNOSIS — R45851 Suicidal ideations: Secondary | ICD-10-CM | POA: Diagnosis not present

## 2018-06-07 DIAGNOSIS — F250 Schizoaffective disorder, bipolar type: Secondary | ICD-10-CM | POA: Insufficient documentation

## 2018-06-07 DIAGNOSIS — F1420 Cocaine dependence, uncomplicated: Secondary | ICD-10-CM | POA: Diagnosis not present

## 2018-06-07 DIAGNOSIS — F17210 Nicotine dependence, cigarettes, uncomplicated: Secondary | ICD-10-CM | POA: Diagnosis not present

## 2018-06-07 DIAGNOSIS — R079 Chest pain, unspecified: Secondary | ICD-10-CM | POA: Diagnosis not present

## 2018-06-07 DIAGNOSIS — F251 Schizoaffective disorder, depressive type: Secondary | ICD-10-CM | POA: Diagnosis present

## 2018-06-07 LAB — ACETAMINOPHEN LEVEL

## 2018-06-07 LAB — COMPREHENSIVE METABOLIC PANEL
ALK PHOS: 64 U/L (ref 38–126)
ALT: 30 U/L (ref 0–44)
ANION GAP: 5 (ref 5–15)
AST: 24 U/L (ref 15–41)
Albumin: 3.4 g/dL — ABNORMAL LOW (ref 3.5–5.0)
BILIRUBIN TOTAL: 0.5 mg/dL (ref 0.3–1.2)
BUN: 21 mg/dL — AB (ref 6–20)
CALCIUM: 9.3 mg/dL (ref 8.9–10.3)
CO2: 25 mmol/L (ref 22–32)
Chloride: 106 mmol/L (ref 98–111)
Creatinine, Ser: 1.12 mg/dL (ref 0.61–1.24)
GFR calc Af Amer: >60 mL/min (ref >60)
Glucose, Bld: 87 mg/dL (ref 70–99)
POTASSIUM: 4.5 mmol/L (ref 3.5–5.1)
Sodium: 136 mmol/L (ref 135–145)
TOTAL PROTEIN: 8.2 g/dL — AB (ref 6.5–8.1)

## 2018-06-07 LAB — RAPID URINE DRUG SCREEN, HOSP PERFORMED
Amphetamines: NOT DETECTED
BARBITURATES: NOT DETECTED
Benzodiazepines: NOT DETECTED
COCAINE: POSITIVE — AB
Opiates: NOT DETECTED
TETRAHYDROCANNABINOL: POSITIVE — AB

## 2018-06-07 LAB — CBC
HEMATOCRIT: 44.6 % (ref 39.0–52.0)
Hemoglobin: 13.5 g/dL (ref 13.0–17.0)
MCH: 27.7 pg (ref 26.0–34.0)
MCHC: 30.3 g/dL (ref 30.0–36.0)
MCV: 91.4 fL (ref 80.0–100.0)
Platelets: 274 10*3/uL (ref 150–400)
RBC: 4.88 MIL/uL (ref 4.22–5.81)
RDW: 15.8 % — AB (ref 11.5–15.5)
WBC: 8.2 10*3/uL (ref 4.0–10.5)
nRBC: 0 % (ref 0.0–0.2)

## 2018-06-07 LAB — SALICYLATE LEVEL: Salicylate Lvl: <7 mg/dL (ref 2.8–30.0)

## 2018-06-07 LAB — ETHANOL

## 2018-06-07 LAB — I-STAT TROPONIN, ED: TROPONIN I, POC: 0.02 ng/mL (ref 0.00–0.08)

## 2018-06-07 MED ORDER — NICOTINE 21 MG/24HR TD PT24
21.0000 mg | MEDICATED_PATCH | Freq: Every day | TRANSDERMAL | Status: DC
  Filled 2018-06-07: qty 1

## 2018-06-07 MED ORDER — AMLODIPINE BESYLATE 5 MG PO TABS
5.0000 mg | ORAL_TABLET | Freq: Every day | ORAL | Status: DC
  Administered 2018-06-08: 5 mg via ORAL
  Filled 2018-06-07: qty 1

## 2018-06-07 MED ORDER — PALIPERIDONE ER 6 MG PO TB24
6.0000 mg | ORAL_TABLET | Freq: Every day | ORAL | Status: DC
  Administered 2018-06-08 – 2018-06-09 (×2): 6 mg via ORAL
  Filled 2018-06-07 (×4): qty 1

## 2018-06-07 MED ORDER — BENZTROPINE MESYLATE 1 MG PO TABS
0.5000 mg | ORAL_TABLET | Freq: Two times a day (BID) | ORAL | Status: DC
  Administered 2018-06-08 – 2018-06-09 (×4): 0.5 mg via ORAL
  Filled 2018-06-07 (×4): qty 1

## 2018-06-07 MED ORDER — ACETAMINOPHEN 325 MG PO TABS
650.0000 mg | ORAL_TABLET | ORAL | Status: DC | PRN
  Administered 2018-06-09: 650 mg via ORAL
  Filled 2018-06-07: qty 2

## 2018-06-07 MED ORDER — HYDROXYZINE HCL 50 MG PO TABS
50.0000 mg | ORAL_TABLET | Freq: Four times a day (QID) | ORAL | Status: DC | PRN
  Administered 2018-06-07: 50 mg via ORAL
  Filled 2018-06-07: qty 1

## 2018-06-07 MED ORDER — ONDANSETRON HCL 4 MG PO TABS: 4.0000 mg | ORAL_TABLET | Freq: Three times a day (TID) | ORAL | Status: DC | PRN

## 2018-06-07 MED ORDER — ALUM & MAG HYDROXIDE-SIMETH 200-200-20 MG/5ML PO SUSP: 30.0000 mL | Freq: Four times a day (QID) | ORAL | Status: DC | PRN
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
## 2018-06-07 NOTE — ED Notes (Signed)
Safety sitter bedside. 

## 2018-06-07 NOTE — Progress Notes (Signed)
Nurse, Gabriel Rung and Dr. Nicanor Alcon informed of pt disposition.

## 2018-06-07 NOTE — ED Triage Notes (Signed)
Pt states that he lives by himself. Pt states that he feels like people are wanting to hurt him. Pt states that he has "demonic voices" that are telling him to hurt himself and others. Pt states that he cut himself on his rt arm several weeks with the intent to kill himself. Pt did not seek help at that time. Pt noted to have healing wounds to rt forearm. Pt calm and cooperative in triage.

## 2018-06-07 NOTE — ED Notes (Signed)
Phlebotomy at bedside.
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
## 2018-06-07 NOTE — ED Notes (Signed)
Pt placed in gown and wanded by security 

## 2018-06-07 NOTE — ED Notes (Signed)
Patient states he is hearing voices and they are telling him to kill himself and everyone here but not given and specifics on how to do it; pt is calm at this time-Monique,RN

## 2018-06-07 NOTE — ED Notes (Signed)
ED Provider at bedside. 

## 2018-06-07 NOTE — ED Notes (Signed)
Belongings inventoried and put in [employer].

## 2018-06-07 NOTE — ED Notes (Signed)
Patient states he is hearing voice really bad; EDP notified and will place [facility]

## 2018-06-08 ENCOUNTER — Other Ambulatory Visit: Payer: Self-pay

## 2018-06-08 ENCOUNTER — Encounter (HOSPITAL_COMMUNITY): Payer: Self-pay | Admitting: Registered Nurse

## 2018-06-08 NOTE — ED Notes (Signed)
Patient states the voices are getting are getting louder; Pt advised he is able to get another PRN in about an hour-Monique,RN

## 2018-06-08 NOTE — Consult Note (Signed)
  No medication changes or recommendation at this time.  Patient is current on psychotropic medications.    Current medications Invega 24 hr 6 mg daily Cogentin 0.5 mg daily  Vistaril 50 mg Q 6 hr prn anxiety, sleep   Bruce [name] B. Malaika Arnall, NP

## 2018-06-08 NOTE — ED Notes (Signed)
Patient eating dinner at bedside.

## 2018-06-08 NOTE — ED Notes (Signed)
Regular Diet was ordered for Lunch. 

## 2018-06-08 NOTE — ED Notes (Signed)
EDP advised of patient's increasing AH; There are no new med orders given at this time-Monique,RN

## 2018-06-08 NOTE — ED Notes (Signed)
Spoke to [facility] he was TTS at 0230 am. They recommend inpt

## 2018-06-08 NOTE — ED Notes (Signed)
Let pt. Know he breakfast tray was here; he said "no, thank you". Left breakfast in room.

## 2018-06-08 NOTE — ED Notes (Signed)
Patient refused a snack. 

## 2018-06-08 NOTE — ED Notes (Signed)
Regular Diet has been ordered for Dinner. 

## 2018-06-08 NOTE — Progress Notes (Signed)
Disposition CSW contacted Shalita, TTS Therapist at [facility], to ask that they review patient for possible admission.  Timmothy Euler. Kaylyn Lim, MSW, LCSWA Disposition Clinical Social Work [phone] (cell) [phone] (office)

## 2018-06-08 NOTE — ED Notes (Signed)
Pt up and eating let  Bruce [name] took his vitals

## 2018-06-08 NOTE — ED Notes (Signed)
Patient standing at the door asking what is going on with his case; Patient made aware that it is almost 4 am and at this time he is just waiting for placement but nothing is going on at this time of the morning; patient seems somewhat aggravated that no mediations has been given for his increased AH; patient was made aware that EDP was notified and confirms there is no medications that can be given at this time that will help with the voices; Pt retreated back to bed-Monique,RN

## 2018-06-09 ENCOUNTER — Encounter (HOSPITAL_COMMUNITY): Payer: Self-pay | Admitting: Registered Nurse

## 2018-06-09 ENCOUNTER — Encounter: Payer: Self-pay | Admitting: Psychiatry

## 2018-06-09 ENCOUNTER — Inpatient Hospital Stay
Admission: AD | Admit: 2018-06-09 | Discharge: 2018-06-16 | DRG: 885 | Disposition: A | Discharge status: Home / Self Care | Payer: Medicaid Other | Source: Intra-hospital | Attending: Psychiatry | Admitting: Psychiatry

## 2018-06-09 ENCOUNTER — Other Ambulatory Visit: Payer: Self-pay

## 2018-06-09 DIAGNOSIS — Z590 Homelessness: Secondary | ICD-10-CM | POA: Diagnosis not present

## 2018-06-09 DIAGNOSIS — F419 Anxiety disorder, unspecified: Secondary | ICD-10-CM

## 2018-06-09 DIAGNOSIS — Z818 Family history of other mental and behavioral disorders: Secondary | ICD-10-CM | POA: Diagnosis not present

## 2018-06-09 DIAGNOSIS — F251 Schizoaffective disorder, depressive type: Secondary | ICD-10-CM

## 2018-06-09 DIAGNOSIS — F17210 Nicotine dependence, cigarettes, uncomplicated: Secondary | ICD-10-CM | POA: Diagnosis present

## 2018-06-09 DIAGNOSIS — R45850 Homicidal ideations: Secondary | ICD-10-CM | POA: Diagnosis present

## 2018-06-09 DIAGNOSIS — T434X6A Underdosing of butyrophenone and thiothixene neuroleptics, initial encounter: Secondary | ICD-10-CM | POA: Diagnosis present

## 2018-06-09 DIAGNOSIS — Z765 Malingerer [conscious simulation]: Secondary | ICD-10-CM | POA: Diagnosis not present

## 2018-06-09 DIAGNOSIS — R45851 Suicidal ideations: Secondary | ICD-10-CM

## 2018-06-09 DIAGNOSIS — F250 Schizoaffective disorder, bipolar type: Secondary | ICD-10-CM | POA: Diagnosis not present

## 2018-06-09 DIAGNOSIS — Z79899 Other long term (current) drug therapy: Secondary | ICD-10-CM | POA: Diagnosis not present

## 2018-06-09 DIAGNOSIS — F1210 Cannabis abuse, uncomplicated: Secondary | ICD-10-CM | POA: Diagnosis present

## 2018-06-09 DIAGNOSIS — F17200 Nicotine dependence, unspecified, uncomplicated: Secondary | ICD-10-CM | POA: Diagnosis present

## 2018-06-09 DIAGNOSIS — F1420 Cocaine dependence, uncomplicated: Secondary | ICD-10-CM | POA: Diagnosis present

## 2018-06-09 DIAGNOSIS — I10 Essential (primary) hypertension: Secondary | ICD-10-CM | POA: Diagnosis present

## 2018-06-09 DIAGNOSIS — G4700 Insomnia, unspecified: Secondary | ICD-10-CM | POA: Diagnosis present

## 2018-06-09 DIAGNOSIS — F1494 Cocaine use, unspecified with cocaine-induced mood disorder: Secondary | ICD-10-CM | POA: Diagnosis present

## 2018-06-09 MED ORDER — MAGNESIUM HYDROXIDE 400 MG/5ML PO SUSP: 30.0000 mL | Freq: Every day | ORAL | Status: DC | PRN

## 2018-06-09 MED ORDER — NICOTINE 21 MG/24HR TD PT24
21.0000 mg | MEDICATED_PATCH | Freq: Every day | TRANSDERMAL | Status: DC
  Administered 2018-06-10: 21 mg via TRANSDERMAL
  Filled 2018-06-09 (×4): qty 1

## 2018-06-09 MED ORDER — TRAZODONE HCL 100 MG PO TABS: 100.0000 mg | ORAL_TABLET | Freq: Every evening | ORAL | Status: DC | PRN

## 2018-06-09 MED ORDER — ALUM & MAG HYDROXIDE-SIMETH 200-200-20 MG/5ML PO SUSP: 30.0000 mL | ORAL | Status: DC | PRN

## 2018-06-09 MED ORDER — AMLODIPINE BESYLATE 5 MG PO TABS
5.0000 mg | ORAL_TABLET | Freq: Every day | ORAL | Status: DC
  Administered 2018-06-10 – 2018-06-16 (×7): 5 mg via ORAL
  Filled 2018-06-09 (×7): qty 1

## 2018-06-09 MED ORDER — ACETAMINOPHEN 325 MG PO TABS
650.0000 mg | ORAL_TABLET | Freq: Four times a day (QID) | ORAL | Status: DC | PRN
  Administered 2018-06-10 – 2018-06-15 (×3): 650 mg via ORAL
  Filled 2018-06-09 (×3): qty 2

## 2018-06-09 MED ORDER — HALOPERIDOL 5 MG PO TABS
10.0000 mg | ORAL_TABLET | Freq: Every day | ORAL | Status: DC
  Administered 2018-06-09 – 2018-06-15 (×7): 10 mg via ORAL
  Filled 2018-06-09 (×6): qty 2

## 2018-06-09 NOTE — ED Notes (Signed)
Faxed over Treatment consent to number given by [facility]. Attempted to call for report with no answer.

## 2018-06-09 NOTE — BH Assessment (Signed)
Patient has been accepted to [facility].  Accepting physician is Dr. Jennet Maduro.  Attending Physician will be Dr. Johnella Moloney.  Patient has been assigned to room 306-A, by [facility] Charge Nurse T'Yawn.  Call report to [phone].  Representative/Transfer Coordinator is Warden/ranger Patient pre-admitted by [facility] Patient Access Ethelene Browns)  [facility] Staff (Sarah & Carney Bern, Disposition Social Worker) made aware of acceptance.   Patient bed will be available after 8:30pm.

## 2018-06-09 NOTE — Progress Notes (Signed)
Pt accepted to [facility]; room 306A Dr. Hinton Dyer is the accepting/attending provider.   Call report to [phone] [facility] ED notified. She will have pt sign consent forms and fax them to [facility] once pt is out of the shower. Pt is voluntary and can be transported by Juel Burrow.  Wells Guiles, LCSW, LCAS Disposition CSW [facility]/TTS [phone]

## 2018-06-09 NOTE — Progress Notes (Signed)
Patient is being accepted to [facility] this afternoon.  Contacted [facility] Psych ED to ask that pt sign consents and have them faxed over to [facility] @ [phone].  Timmothy Euler. Kaylyn Lim, MSW, LCSWA Disposition Clinical Social Work [phone] (cell) [phone] (office)

## 2018-06-09 NOTE — ED Provider Notes (Signed)
He is comfortable but admits to AV. He understands that he is being admitted.   Mancel Bale, MD 06/09/18 1439   Mancel Bale, MD 06/09/18 [phone]

## 2018-06-09 NOTE — Tx Team (Signed)
Initial Treatment Plan 06/09/2018 11:41 PM Arlan Organ [id number]    PATIENT STRESSORS: Financial difficulties Marital or family conflict Occupational concerns Substance abuse   PATIENT STRENGTHS: Average or above average intelligence Capable of independent living Communication skills Motivation for treatment/growth   PATIENT IDENTIFIED PROBLEMS:   Illicit Drug use  (cocaine, marijuana)   Suicide ideations    Hearing Voices, telling him to kill self    Financial Issues         DISCHARGE CRITERIA:  Adequate post-discharge living arrangements Improved stabilization in mood, thinking, and/or behavior Medical problems require only outpatient monitoring Motivation to continue treatment in a less acute level of care Reduction of life-threatening or endangering symptoms to within safe limits  PRELIMINARY DISCHARGE PLAN: Attend 12-step recovery group Outpatient therapy Participate in family therapy Return to previous living arrangement  PATIENT/FAMILY INVOLVEMENT: This treatment plan has been presented to and reviewed with the patient, Bruce [name], .  The patient  have been given the opportunity to ask questions and make suggestions.  Lelan Pons, RN 06/09/2018, 11:41 PM

## 2018-06-09 NOTE — ED Notes (Signed)
Lunch tray ordered 

## 2018-06-09 NOTE — ED Notes (Signed)
Belongings inventory sheet faxed to [employer] [phone].

## 2018-06-09 NOTE — ED Notes (Signed)
TTS at bedside. 

## 2018-06-09 NOTE — Consult Note (Signed)
Telepsych Consultation   Reason for Consult:  Auditory hallucinations Referring Physician:  Sherwood Gambler, MD Location of Patient: [facility] Location of Provider: [facility]  Patient Identification: Bruce [name] MRN:  [id number] Principal Diagnosis: Schizoaffective disorder, depressive type [facility]) Diagnosis:   Patient Active Problem List   Diagnosis Date Noted  . Schizoaffective disorder, depressive type ([place]) [F25.1] 08/04/2017  . 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 Polysubstance abuse ([place]) [F19.10] 02/27/2017  . Auditory hallucinations [R44.0] 02/27/2017  . Substance induced mood disorder ([place]) [F19.94] 02/27/2017  . Cocaine use disorder, severe, dependence ([place]) [F14.20] 01/05/2017  . Cocaine-induced mood disorder ([place]) [F14.94] 01/05/2017  . Cocaine use disorder, mild, abuse ([place]) [F14.10] 05/03/2016  . Cannabis use disorder, mild, abuse [F12.10] 05/03/2016  . Suicide ideation [R45.851] 04/30/2016  . Paranoid schizophrenia ([place]) [F20.0]   . Hyperprolactinemia ([place]) [E22.1] 09/24/2015  . Alcohol use disorder, moderate, dependence ([place]) [F10.20] 09/22/2015  . Morbid obesity ([place]) [E66.01] 09/22/2015    Total Time spent with patient: 30 minutes  Subjective:   Bruce [name] is a 42 y.o. male patient presented to [facility] with complaints of auditory hallucinations and suicidal ideation  HPI:  Bruce [name], 42 y.o., male patient seen via telepsych by this provider; chart reviewed and consulted with Dr. Dwyane Dee on 06/09/18.  On evaluation Bruce [name] reports that he feels like someone spiked his alcohol which made his auditory hallucinations worse.  Patient states that he is hearing voices telling him to kill others and kill himself.  States that he continues to have suicidal ideation.  "I got 2 9 mm in my nightstand drawer;  When I get out of here I going to kill them two that spiked my drink; cause everybody know what problem I have with  cocaine; then I'm going to kill myself and a place in hell will be waiting on me. I already feel like the devil in the room with me; I feeling all hot and sweating and stuff."    Past Psychiatric History: Cocaine abuse; Paranoid schizophrenias;  alcohol abuse, multiple psychiatric hospitalization  Risk to Self: Suicidal Ideation: Yes-Currently Present Suicidal Intent: Yes-Currently Present Is patient at risk for suicide?: Yes Suicidal Plan?: Yes-Currently Present Specify Current Suicidal Plan: "Cut my wrist up." Access to Means: Yes Specify Access to Suicidal Means: Household Items What has been your use of drugs/alcohol within the last 12 months?: Cocaine and some marijuana use.  How many times?: 10 Other Self Harm Risks: Reports cutting arms.  Triggers for Past Attempts: Hallucinations Intentional Self Injurious Behavior: Cutting Comment - Self Injurious Behavior: Pt reports cutting into his arm.  Risk to Others: Homicidal Ideation: Yes-Currently Present Thoughts of Harm to Others: Yes-Currently Present Comment - Thoughts of Harm to Others: Pt reports, "Cut somebody throat up."  Current Homicidal Intent: Yes-Currently Present Current Homicidal Plan: Yes-Currently Present Describe Current Homicidal Plan: "Cut somebody throat up." Access to Homicidal Means: Yes Describe Access to Homicidal Means: Household Items Identified Victim: Denied specific victim.  History of harm to others?: No Assessment of Violence: On admission Violent Behavior Description: Pt denied.  Does patient have access to weapons?: Yes (Comment)([employer]ives ) Criminal Charges Pending?: No Does patient have a court date: No Prior Inpatient Therapy: Prior Inpatient Therapy: Yes Prior Therapy Dates: 2019 Prior Therapy Facilty/Provider(s): [place]; [facility] Reason for Treatment: Schizophrenia Prior Outpatient Therapy: Prior Outpatient Therapy: Yes Prior Therapy Dates: Current  Prior Therapy  Facilty/Provider(s): [facility] Reason for Treatment: Schizophrenia Does patient  have an ACCT team?: No Does patient have Intensive In-House Services?  : No Does patient have [facility] services? : No Does patient have P4CC services?: No  Past Medical History:  Past Medical History:  Diagnosis Date  . Depression   . Obesity   . Schizophrenia ([place])    History reviewed. No pertinent surgical history. Family History:  Family History  Problem Relation Age of Onset  . Schizophrenia Mother    Family Psychiatric  History: See list above Social History:  Social History   Substance and Sexual Activity  Alcohol Use No     Social History   Substance and Sexual Activity  Drug Use Yes  . Types: Marijuana, "Crack" cocaine, Cocaine   Comment: "in the past"    Social History   Socioeconomic History  . Marital status: Single    Spouse name: Not on file  . Number of children: Not on file  . Years of education: Not on file  . Highest education level: Not on file  Occupational History  . Not on file  Social Needs  . Financial resource strain: Not on file  . Food insecurity:    Worry: Not on file    Inability: Not on file  . Transportation needs:    Medical: Not on file    Non-medical: Not on file  Tobacco Use  . Smoking status: Current Every Day Smoker    Packs/day: 0.50    Types: Cigarettes  . Smokeless tobacco: Never Used  Substance and Sexual Activity  . Alcohol use: No  . Drug use: Yes    Types: Marijuana, "Crack" cocaine, Cocaine    Comment: "in the past"  . Sexual activity: Not Currently  Lifestyle  . Physical activity:    Days per week: Not on file    Minutes per session: Not on file  . Stress: Not on file  Relationships  . Social connections:    Talks on phone: Not on file    Gets together: Not on file    Attends religious service: Not on file    Active member of club or organization: Not on file    Attends meetings of clubs or  organizations: Not on file    Relationship status: Not on file  Other Topics Concern  . Not on file  Social History Narrative  . Not on file   Additional Social History:    Allergies:  No Known Allergies  Labs:  Results for orders placed or performed during the hospital encounter of 06/07/18 (from the past 48 hour(s))  Comprehensive metabolic panel     Status: Abnormal   Collection Time: 06/07/18  4:32 PM  Result Value Ref Range   Sodium 136 135 - 145 mmol/L   Potassium 4.5 3.5 - 5.1 mmol/L   Chloride 106 98 - 111 mmol/L   CO2 25 22 - 32 mmol/L   Glucose, Bld 87 70 - 99 mg/dL   BUN 21 (H) 6 - 20 mg/dL   Creatinine, Ser 1.12 0.61 - 1.24 mg/dL   Calcium 9.3 8.9 - 10.3 mg/dL   Total Protein 8.2 (H) 6.5 - 8.1 g/dL   Albumin 3.4 (L) 3.5 - 5.0 g/dL   AST 24 15 - 41 U/L   ALT 30 0 - 44 U/L   Alkaline Phosphatase 64 38 - 126 U/L   Total Bilirubin 0.5 0.3 - 1.2 mg/dL   GFR calc non Af Amer >60 >60 mL/min   GFR calc Af Amer >60 >60 mL/min  Comment: (NOTE) The eGFR has been calculated using the CKD EPI equation. This calculation has not been validated in all clinical situations. eGFR's persistently <60 mL/min signify possible Chronic Kidney Disease.    Anion gap 5 5 - 15    Comment: Performed at [facility] [address], [place], [place] [address]  Ethanol     Status: None   Collection Time: 06/07/18  4:32 PM  Result Value Ref Range   Alcohol, Ethyl (B) <10 <10 mg/dL    Comment: (NOTE) Lowest detectable limit for serum alcohol is 10 mg/dL. For medical purposes only. Performed at [facility], [place] [address], [place], [place] [address]   Salicylate level     Status: None   Collection Time: 06/07/18  4:32 PM  Result Value Ref Range   Salicylate Lvl <2.7 2.8 - 30.0 mg/dL    Comment: Performed at [facility] [address], [place], [place] [address]  Acetaminophen level     Status: Abnormal   Collection Time: 06/07/18  4:32 PM  Result Value Ref  Range   Acetaminophen (Tylenol), Serum <10 (L) 10 - 30 ug/mL    Comment: (NOTE) Therapeutic concentrations vary significantly. A range of 10-30 ug/mL  may be an effective concentration for many patients. However, some  are best treated at concentrations outside of this range. Acetaminophen concentrations >150 ug/mL at 4 hours after ingestion  and >50 ug/mL at 12 hours after ingestion are often associated with  toxic reactions. Performed at [facility], [place] [address], [place], [place] [address]   cbc     Status: Abnormal   Collection Time: 06/07/18  4:32 PM  Result Value Ref Range   WBC 8.2 4.0 - 10.5 K/uL   RBC 4.88 4.22 - 5.81 MIL/uL   Hemoglobin 13.5 13.0 - 17.0 g/dL   HCT 44.6 39.0 - 52.0 %   MCV 91.4 80.0 - 100.0 fL   MCH 27.7 26.0 - 34.0 pg   MCHC 30.3 30.0 - 36.0 g/dL   RDW 15.8 (H) 11.5 - 15.5 %   Platelets 274 150 - 400 K/uL   nRBC 0.0 0.0 - 0.2 %    Comment: Performed at [facility] [address] [place], [place] [address]  I-stat troponin, ED     Status: None   Collection Time: 06/07/18  6:29 PM  Result Value Ref Range   Troponin i, poc 0.02 0.00 - 0.08 ng/mL   Comment 3            Comment: Due to the release kinetics of cTnI, a negative result within the first hours of the onset of symptoms does not rule out myocardial infarction with certainty. If myocardial infarction is still suspected, repeat the test at appropriate intervals.   Rapid urine drug screen (hospital performed)     Status: Abnormal   Collection Time: 06/07/18  7:19 PM  Result Value Ref Range   Opiates NONE DETECTED NONE DETECTED   Cocaine POSITIVE (A) NONE DETECTED   Benzodiazepines NONE DETECTED NONE DETECTED   Amphetamines NONE DETECTED NONE DETECTED   Tetrahydrocannabinol POSITIVE (A) NONE DETECTED   Barbiturates NONE DETECTED NONE DETECTED    Comment: (NOTE) DRUG SCREEN FOR MEDICAL PURPOSES ONLY.  IF CONFIRMATION IS NEEDED FOR ANY PURPOSE, NOTIFY LAB WITHIN 5  DAYS. LOWEST DETECTABLE LIMITS FOR URINE DRUG SCREEN Drug Class  Cutoff (ng/mL) Amphetamine and metabolites    1000 Barbiturate and metabolites    200 Benzodiazepine                 585 Tricyclics and metabolites     300 Opiates and metabolites        300 Cocaine and metabolites        300 THC                            50 Performed at [facility], [place] [address], [place], [place] [address]     Medications:  Current Facility-Administered Medications  Medication Dose Route Frequency Provider Last Rate Last Dose  . acetaminophen (TYLENOL) tablet 650 mg  650 mg Oral Q4H PRN Sherwood Gambler, MD   650 mg at 06/09/18 0829  . alum & mag hydroxide-simeth (MAALOX/MYLANTA) 200-200-20 MG/5ML suspension 30 mL  30 mL Oral Q6H PRN Sherwood Gambler, MD      . amLODipine (NORVASC) tablet 5 mg  5 mg Oral Daily Sherwood Gambler, MD   5 mg at 06/08/18 1236  . benztropine (COGENTIN) tablet 0.5 mg  0.5 mg Oral BID WC Sherwood Gambler, MD   0.5 mg at 06/09/18 0824  . hydrOXYzine (ATARAX/VISTARIL) tablet 50 mg  50 mg Oral Q6H PRN Sherwood Gambler, MD   50 mg at 06/07/18 2057  . nicotine (NICODERM CQ - dosed in mg/24 hours) patch 21 mg  21 mg Transdermal Daily Sherwood Gambler, MD      . ondansetron [facility]) tablet 4 mg  4 mg Oral Q8H PRN Sherwood Gambler, MD      . paliperidone (INVEGA) 24 hr tablet 6 mg  6 mg Oral Daily Sherwood Gambler, MD   6 mg at 06/09/18 1141   Current Outpatient Medications  Medication Sig Dispense Refill  . ibuprofen (ADVIL,MOTRIN) 200 MG tablet Take 200 mg by mouth every 6 (six) hours as needed for moderate pain.    Marland Kitchen amLODipine (NORVASC) 5 MG tablet Take 1 tablet (5 mg total) by mouth daily. For high blood pressure (Patient not taking: Reported on 11/08/2017) 10 tablet 0  . benztropine (COGENTIN) 0.5 MG tablet Take 1 tablet (0.5 mg total) by mouth 2 (two) times daily with a meal. For prevention of drug induced trmors (Patient not taking: Reported on 11/08/2017) 60  tablet 0  . hydrOXYzine (ATARAX/VISTARIL) 50 MG tablet Take 1 tablet (50 mg total) by mouth every 6 (six) hours as needed for anxiety (sleep). (Patient not taking: Reported on 11/08/2017) 60 tablet 0  . paliperidone (INVEGA) 6 MG 24 hr tablet Take 1 tablet (6 mg total) by mouth daily. For mood control (Patient not taking: Reported on 11/08/2017) 30 tablet 0    Musculoskeletal: Strength & Muscle Tone: within normal limits Gait & Station: normal Patient leans: N/A  Psychiatric Specialty Exam: Physical Exam  ROS  Blood pressure (!) 88/68, pulse 65, temperature 97.9 F (36.6 C), temperature source Oral, resp. rate 17, SpO2 97 %.There is no height or weight on file to calculate BMI.  General Appearance: Casual  Eye Contact:  Good  Speech:  Clear and Coherent and Normal Rate  Volume:  Normal  Mood:  Anxious and Depressed  Affect:  Congruent  Thought Process:  Goal Directed  Orientation:  Full (Time, Place, and Person)  Thought Content:  Hallucinations: Auditory Command:  voices telling him to kill himself and others  Suicidal Thoughts:  Yes.  with intent/plan  Homicidal Thoughts:  Yes.  with intent/plan  Memory:  Immediate;   Good Recent;   Good Remote;   Good  Judgement:  Impaired  Insight:  Lacking  Psychomotor Activity:  Normal  Concentration:  Concentration: Good and Attention Span: Good  Recall:  Good  Fund of Knowledge:  Good  Language:  Good  Akathisia:  No  Handed:  Right  AIMS (if indicated):     Assets:  Communication Skills Desire for Improvement Housing  ADL's:  Intact  Cognition:  WNL  Sleep:        Treatment Plan Summary: Daily contact with patient to assess and evaluate symptoms and progress in treatment, Medication management and Plan Inpatient psychiatric treatment  Disposition: Recommend psychiatric Inpatient admission when medically cleared.  This service was provided via telemedicine using a 2-way, interactive audio and video technology.  Names of  all persons participating in this telemedicine service and their role in this encounter. Name: Earleen Newport, NP Role: Tele psych assessment  Name: Dr. Dwyane Dee Role: Psychiatrist  Name: Bruce [name] Role: Patient   Name:  Role:     Earleen Newport, NP 06/09/2018 12:09 PM

## 2018-06-09 NOTE — ED Notes (Signed)
Pt belongings given to [name] Garbe with [facility]. Pt ambulated out of ED with transport service, steady gait.

## 2018-06-09 NOTE — ED Notes (Signed)
Meal tray ordered 

## 2018-06-10 DIAGNOSIS — F251 Schizoaffective disorder, depressive type: Principal | ICD-10-CM

## 2018-06-10 NOTE — BHH Group Notes (Signed)
LCSW Group Therapy Note  06/10/2018 1:15pm  Type of Therapy and Topic:  Group Therapy:  Cognitive Distortions  Participation Level:  Did Not Attend   Description of Group:    Patients in this group will be introduced to the topic of cognitive distortions.  Patients will identify and describe cognitive distortions, describe the feelings these distortions create for them.  Patients will identify one or more situations in their personal life where they have cognitively distorted thinking and will verbalize challenging this cognitive distortion through positive thinking skills.  Patients will practice the skill of using positive affirmations to challenge cognitive distortions using affirmation cards.    Therapeutic Goals:  1. Patient will identify two or more cognitive distortions they have used 2. Patient will identify one or more emotions that stem from use of a cognitive distortion 3. Patient will demonstrate use of a positive affirmation to counter a cognitive distortion through discussion and/or role play. 4. Patient will describe one way cognitive distortions can be detrimental to wellness   Summary of Patient Progress:Pt was invited to attend group but chose not to attend. CSW will continue to encourage pt to attend group throughout their admission.      Therapeutic Modalities:   Cognitive Behavioral Therapy Motivational Interviewing   Cort Dragoo  CUEBAS-COLON, LCSW 06/10/2018 10:34 AM   

## 2018-06-10 NOTE — BHH Suicide Risk Assessment (Signed)
[facility] Admission Suicide Risk Assessment   Nursing information obtained from:  Patient Demographic factors:  Living alone Current Mental Status:  Suicidal ideation indicated by patient Loss Factors:  Decline in physical health Historical Factors:  Impulsivity Risk Reduction Factors:  Positive therapeutic relationship  Total Time spent with patient: 1 hour Principal Problem: Schizoaffective disorder, depressive type (HCC) Diagnosis:   Patient Active Problem List   Diagnosis Date Noted  . Tobacco use disorder [F17.200] 06/09/2018  . HTN (hypertension) [I10] 06/09/2018  . Schizoaffective disorder, depressive type (HCC) [F25.1] 08/04/2017  . 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 Polysubstance abuse (HCC) [F19.10] 02/27/2017  . Auditory hallucinations [R44.0] 02/27/2017  . Substance induced mood disorder (HCC) [F19.94] 02/27/2017  . Cocaine use disorder, severe, dependence (HCC) [F14.20] 01/05/2017  . Cocaine-induced mood disorder (HCC) [F14.94] 01/05/2017  . Cocaine use disorder, mild, abuse (HCC) [F14.10] 05/03/2016  . Cannabis use disorder, mild, abuse [F12.10] 05/03/2016  . Suicidal ideations [R45.851] 04/30/2016  . Paranoid schizophrenia (HCC) [F20.0]   . 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 Alcohol use disorder, moderate, dependence (HCC) [F10.20] 09/22/2015  . Morbid obesity (HCC) [E66.01] 09/22/2015   Subjective Data:  I am hearing voices to hurt them and myself" Pt is 42 y/ o M with h/o schizoaffective d/o, referred by [facility]. Per report, pt worsening psychosis, he feels like someone spiked his alcohol which made his auditory hallucinations worse,  hearing voices telling him to kill others and kill himself. pt reportedly voiced"I got 2 9 mm in my nightstand drawer; When I get out of here I going to kill them two that spiked my drink; cause everybody know what problem I have with cocaine; then I'm going to kill myself and a place in hell will  be waiting on me. I already feel like the devil in the room with me; I feeling all hot and sweating and stuff."  Pt admits hearing voices and feeling suicidal for about 2 weeks, states he stopped taking pill haldol about 2 weeks ago, per report he stooped haldol dec about 2 months ago, pt states he got haldol dec shot early this month. Pt denies suicidal intent, contract for safety, denies HI. Pt guarded, suspicious, paranoid.  UDS positive for cocaine and THC. pt states using cocaine and THC -he has been using only occ for last one month, admits using more regularly before 1 month. states he lives by himself. Pt wanting rehab upon d/c.   Continued Clinical Symptoms:  Alcohol Use Disorder Identification Test Final Score (AUDIT): 8 The "Alcohol Use Disorders Identification Test", Guidelines for Use in Primary Care, Second Edition.  World Science writer [facility]). Score between 0-7:  no or low risk or alcohol related problems. Score between 8-15:  moderate risk of alcohol related problems. Score between 16-19:  high risk of alcohol related problems. Score 20 or above:  warrants further diagnostic evaluation for alcohol dependence and treatment.   CLINICAL FACTORS:   depression, CAH, substance use   Musculoskeletal: Strength & Muscle Tone: within normal limits Gait & Station: normal Patient leans:   Psychiatric Specialty Exam: Physical Exam  Nursing note and vitals reviewed.   ROS  Blood pressure 125/77, pulse 66, temperature 97.6 F (36.4 C), temperature source Oral, resp. rate 18, height 5\' 5"  (1.651 m), weight (!) 164.2 kg, SpO2 100 %.Body mass index is 60.24 kg/m.  General Appearance:Casual, obese  Eye Contact:fair  Speech:soft  Volume:Normal  Mood:Anxious and Depressed  Affect:restricted  Thought Process:concrete  Orientation:Full (Time,  Place, and Person)  Thought Content:Hallucinations:Auditory Command:voices telling him to kill himself and others   Suicidal Thoughts:Yes.without intent/plan  Homicidal Thoughts:intermittent, no intent  Memory:Immediate;Good Recent;Good Remote;Good  Judgement:Impaired  Insight:Lacking  Psychomotor Activity:Normal  Concentration:Concentration:Goodand Attention Span: Good  Recall:Good  Fund of Knowledge:Good  Language:Good  Akathisia:No  Handed:Right  AIMS (if indicated):   Assets:Communication Skills Desire for Improvement Housing  ADL's:Intact  Cognition:WNL  Sleep:         COGNITIVE FEATURES THAT CONTRIBUTE TO RISK:  Closed-mindedness    SUICIDE RISK:   high  PLAN OF CARE:  Daily contact with patient to assess and evaluate symptoms and progress in treatment and Medication management Pt with schizoaffective d/o , substance use issues admitted for worsening psychosis, CAH telling him to kill others and self. Pt on haldol dec, will verify dose and last shot. Cont po haldol.  Group/milieu tx.  Pt wanting rehab upon d/c from hospital.   Observation Level/Precautions:  15 minute checks  Laboratory:  CBC Chemistry Profile HCG UDS UA  Psychotherapy:    Medications:    Consultations:    Discharge Concerns:    Estimated LOS:  Other:     Physician Treatment Plan for Primary Diagnosis: Schizoaffective disorder, depressive type (HCC) Long Term Goal(s): Improvement in symptoms so as ready for discharge  Short Term Goals: Ability to identify changes in lifestyle to reduce recurrence of condition will improve, Ability to verbalize feelings will improve, Ability to disclose and discuss suicidal ideas, Ability to demonstrate self-control will improve, Ability to identify and develop effective coping behaviors will improve, Ability to maintain clinical measurements within normal limits will improve, Compliance with prescribed medications will improve and Ability to identify triggers associated with substance abuse/mental health issues will  improve  Physician Treatment Plan for Secondary Diagnosis: Principal Problem:   Schizoaffective disorder, depressive type (HCC) Active Problems:   Suicidal ideations   Cannabis use disorder, mild, abuse   Cocaine use disorder, severe, dependence (HCC)   Tobacco use disorder   HTN (hypertension)  Long Term Goal(s): Improvement in symptoms so as ready for discharge  Short Term Goals: Ability to identify changes in lifestyle to reduce recurrence of condition will improve, Ability to verbalize feelings will improve, Ability to disclose and discuss suicidal ideas, Ability to demonstrate self-control will improve, Ability to identify and develop effective coping behaviors will improve, Ability to maintain clinical measurements within normal limits will improve, Compliance with prescribed medications will improve and Ability to identify triggers associated with substance abuse/mental health issues will improve   I certify that inpatient services furnished can reasonably be expected to improve the patient's condition.   Beverly Sessions, MD 06/10/2018, 3:35 PM

## 2018-06-10 NOTE — Plan of Care (Signed)
Verbalize understanding of information received. Emotional and mental status improved  Denies suicidal ideations Understanding of disease concept and lifestyle changes  Knowledgeable of resources available  to meet health care needs . Working on Materials engineer . Working on concerns that affect anxiety Understanding of decision making  Compliant with medication given and understanding . Voice of no problems  with sleep.  Problem: Education: Goal: Ability to make informed decisions regarding treatment will improve Outcome: Progressing   Problem: Self-Concept: Goal: Ability to disclose and discuss suicidal ideas will improve Outcome: Progressing Goal: Will verbalize positive feelings about self Outcome: Progressing   Problem: Education: Goal: Knowledge of disease or condition will improve Outcome: Progressing Goal: Understanding of discharge needs will improve Outcome: Progressing   Problem: Health Behavior/Discharge Planning: Goal: Ability to identify changes in lifestyle to reduce recurrence of condition will improve Outcome: Progressing Goal: Identification of resources available to assist in meeting health care needs will improve Outcome: Progressing   Problem: Safety: Goal: Ability to remain free from injury will improve Outcome: Progressing   Problem: Coping: Goal: Ability to identify and develop effective coping behavior will improve Outcome: Progressing Goal: Ability to interact with others will improve Outcome: Progressing Goal: Demonstration of participation in decision-making regarding own care will improve Outcome: Progressing Goal: Ability to use eye contact when communicating with others will improve Outcome: Progressing   Problem: Education: Goal: Utilization of techniques to improve thought processes will improve Outcome: Progressing Goal: Knowledge of the prescribed therapeutic regimen will improve Outcome: Progressing   Problem: Activity: Goal: Interest or  engagement in leisure activities will improve Outcome: Progressing Goal: Imbalance in normal sleep/wake cycle will improve Outcome: Progressing

## 2018-06-10 NOTE — Progress Notes (Signed)
New admit in the unit voluntary, present with Hx. Of schizophrenia and cocaine abuse with suicidal thoughts and hallucination , patient said that he is hearing voices telling him to kill himself , patient states that the voices come and go many times ain a day, but denies AH at this time. Unit guidelines and expected behaviors discussed , cold tray and beverages provided , hygiene products provided , unit and room orientation complete body search and skin checks done by Alex/Bukola RN , no contraband found and skin is clean no open area. Patient denies SI/HI, patient is appropriate and demonstrate self control, mood is bright, 15 minute rounding is in progress.

## 2018-06-10 NOTE — Progress Notes (Signed)
D: Patient stated slept fairlast night .Stated appetite  good and energy level  low. Stated concentration is good . Stated on Depression scale 6, hopeless 6 and anxiety 6 .( low 0-10 high) Denies suicidal  homicidal ideations   auditory hallucinations voices telling him to hurt self . He states he is not suicidal  No pain concerns . Appropriate ADL'S. Interacting with peers and staff. Verbalize understanding of information received. Emotional and mental status improved  Denies suicidal ideations Understanding of disease concept and lifestyle changes  Knowledgeable of resources available  to meet health care needs . Working on Materials engineer . Working on concerns that affect anxiety Understanding of decision making  Compliant with medication given and understanding . Voice of no problems  with sleep. A: Encourage patient participation with unit programming . Instruction  Given on  Medication , verbalize understanding. R: Voice no other concerns. Staff continue to monitor

## 2018-06-10 NOTE — H&P (Signed)
Psychiatric Admission Assessment Adult  Patient Identification: Bruce [name] MRN:  [id number] Date of Evaluation:  06/10/2018 Chief Complaint:  Schizophrenia Principal Diagnosis: Schizoaffective disorder, depressive type (HCC) Diagnosis:   Patient Active Problem List   Diagnosis Date Noted  . Tobacco use disorder [F17.200] 06/09/2018  . HTN (hypertension) [I10] 06/09/2018  . Schizoaffective disorder, depressive type (HCC) [F25.1] 08/04/2017  . 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 Polysubstance abuse (HCC) [F19.10] 02/27/2017  . Auditory hallucinations [R44.0] 02/27/2017  . Substance induced mood disorder (HCC) [F19.94] 02/27/2017  . Cocaine use disorder, severe, dependence (HCC) [F14.20] 01/05/2017  . Cocaine-induced mood disorder (HCC) [F14.94] 01/05/2017  . Cocaine use disorder, mild, abuse (HCC) [F14.10] 05/03/2016  . Cannabis use disorder, mild, abuse [F12.10] 05/03/2016  . Suicidal ideations [R45.851] 04/30/2016  . Paranoid schizophrenia (HCC) [F20.0]   . 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 Alcohol use disorder, moderate, dependence (HCC) [F10.20] 09/22/2015  . Morbid obesity (HCC) [E66.01] 09/22/2015   History of Present Illness:  I am hearing voices to hurt them and myself" Pt is 42 y/ o M with h/o schizoaffective d/o, referred by [place]. Per report, pt worsening psychosis, he feels like someone spiked his alcohol which made his auditory hallucinations worse,  hearing voices telling him to kill others and kill himself.  pt reportedly voiced "I got 2 9 mm in my nightstand drawer;  When I get out of here I going to kill them two that spiked my drink; cause everybody know what problem I have with cocaine; then I'm going to kill myself and a place in hell will be waiting on me. I already feel like the devil in the room with me; I feeling all hot and sweating and stuff."   Pt admits hearing voices and feeling suicidal for about 2 weeks, states  he stopped taking pill haldol about 2 weeks ago, per report he stooped haldol dec about 2 months ago, pt states he got haldol dec shot early this month. Pt denies suicidal intent, contract for safety, denies HI. Pt guarded, suspicious, paranoid.  UDS positive for cocaine and THC. pt states using cocaine and THC -he has been using only occ for last one month, admits using more regularly before 1 month. states he lives by himself. Pt wanting rehab upon d/c.  Total Time spent with patient: 1 hour  Past Psychiatric History: Cocaine abuse; Paranoid schizophrenias;  alcohol abuse, multiple psychiatric hospitalization. Has OP in [place].    Is the patient at risk to self? Yes.    Has the patient been a risk to self in the past 6 months? Yes.    Has the patient been a risk to self within the distant past? Yes.    Is the patient a risk to others? Yes.    Has the patient been a risk to others in the past 6 months? Yes.    Has the patient been a risk to others within the distant past? No.   Prior Inpatient Therapy:   Prior Outpatient Therapy:    Alcohol Screening: 1. How often do you have a drink containing alcohol?: Monthly or less 2. How many drinks containing alcohol do you have on a typical day when you are drinking?: 3 or 4 3. How often do you have six or more drinks on one occasion?: Less than monthly AUDIT-C Score: 3 4. How often during the last year have you found that you were not able to stop drinking once you had started?: Less than  monthly 5. How often during the last year have you failed to do what was normally expected from you becasue of drinking?: Less than monthly 6. How often during the last year have you needed a first drink in the morning to get yourself going after a heavy drinking session?: Less than monthly 7. How often during the last year have you had a feeling of guilt of remorse after drinking?: Less than monthly 8. How often during the last year have you been unable to  remember what happened the night before because you had been drinking?: Less than monthly 9. Have you or someone else been injured as a result of your drinking?: No 10. Has a relative or friend or a doctor or another health worker been concerned about your drinking or suggested you cut down?: No Alcohol Use Disorder Identification Test Final Score (AUDIT): 8 Intervention/Follow-up: Alcohol Education Substance Abuse History in the last 12 months:  Yes.   Consequences of Substance Abuse:  Previous Psychotropic Medications: Yes , haldol Psychological Evaluations:  Past Medical History:  Past Medical History:  Diagnosis Date  . Depression   . Obesity   . Schizophrenia (HCC)    History reviewed. No pertinent surgical history. Family History:  Family History  Problem Relation Age of Onset  . Schizophrenia Mother    Family Psychiatric  History: mom - schizophrenia Tobacco Screening: Have you used any form of tobacco in the last 30 days? (Cigarettes, Smokeless Tobacco, Cigars, and/or Pipes): Yes Tobacco use, Select all that apply: 5 or more cigarettes per day Are you interested in Tobacco Cessation Medications?: No, patient refused Counseled patient on smoking cessation including recognizing danger situations, developing coping skills and basic information about quitting provided: Yes Social History:  Social History   Substance and Sexual Activity  Alcohol Use No     Social History   Substance and Sexual Activity  Drug Use Yes  . Types: Marijuana, "Crack" cocaine, Cocaine   Comment: "in the past"    Additional Social History:                           Allergies:  No Known Allergies Lab Results: No results found for this or any previous visit (from the past 48 hour(s)).  Blood Alcohol level:  Lab Results  Component Value Date   ETH <10 06/07/2018   ETH <10 11/07/2017    Metabolic Disorder Labs:  Lab Results  Component Value Date   HGBA1C 5.2 10/12/2016   MPG  103 10/12/2016   MPG 108 09/26/2015   Lab Results  Component Value Date   PROLACTIN 63.9 (H) 10/12/2016   PROLACTIN 19.9 (H) 05/06/2016   Lab Results  Component Value Date   CHOL 148 08/05/2017   TRIG 73 08/05/2017   HDL 40 (L) 08/05/2017   CHOLHDL 3.7 08/05/2017   VLDL 15 08/05/2017   LDLCALC 93 08/05/2017   LDLCALC 88 10/12/2016    Current Medications: Current Facility-Administered Medications  Medication Dose Route Frequency Provider Last Rate Last Dose  . acetaminophen (TYLENOL) tablet 650 mg  650 mg Oral Q6H PRN Pucilowska, Jolanta B, MD   650 mg at 06/10/18 0648  . alum & mag hydroxide-simeth (MAALOX/MYLANTA) 200-200-20 MG/5ML suspension 30 mL  30 mL Oral Q4H PRN Pucilowska, Jolanta B, MD      . amLODipine (NORVASC) tablet 5 mg  5 mg Oral Daily Pucilowska, Jolanta B, MD   5 mg at 06/10/18 0811  .  haloperidol (HALDOL) tablet 10 mg  10 mg Oral QHS Pucilowska, Jolanta B, MD   10 mg at 06/09/18 2345  . magnesium hydroxide (MILK OF MAGNESIA) suspension 30 mL  30 mL Oral Daily PRN Pucilowska, Jolanta B, MD      . nicotine (NICODERM CQ - dosed in mg/24 hours) patch 21 mg  21 mg Transdermal Q0600 Pucilowska, Jolanta B, MD   21 mg at 06/10/18 0646  . traZODone (DESYREL) tablet 100 mg  100 mg Oral QHS PRN Pucilowska, Jolanta B, MD       PTA Medications: Medications Prior to Admission  Medication Sig Dispense Refill Last Dose  . amLODipine (NORVASC) 5 MG tablet Take 1 tablet (5 mg total) by mouth daily. For high blood pressure (Patient not taking: Reported on 11/08/2017) 10 tablet 0 Not Taking at Unknown time  . benztropine (COGENTIN) 0.5 MG tablet Take 1 tablet (0.5 mg total) by mouth 2 (two) times daily with a meal. For prevention of drug induced trmors (Patient not taking: Reported on 11/08/2017) 60 tablet 0 Not Taking at Unknown time  . hydrOXYzine (ATARAX/VISTARIL) 50 MG tablet Take 1 tablet (50 mg total) by mouth every 6 (six) hours as needed for anxiety (sleep). (Patient not  taking: Reported on 11/08/2017) 60 tablet 0 Not Taking at Unknown time  . ibuprofen (ADVIL,MOTRIN) 200 MG tablet Take 200 mg by mouth every 6 (six) hours as needed for moderate pain.   unk at prn  . paliperidone (INVEGA) 6 MG 24 hr tablet Take 1 tablet (6 mg total) by mouth daily. For mood control (Patient not taking: Reported on 11/08/2017) 30 tablet 0 Not Taking at Unknown time    Musculoskeletal: Strength & Muscle Tone: within normal limits Gait & Station: normal Patient leans:   Psychiatric Specialty Exam: Physical Exam  Nursing note and vitals reviewed.   ROS  Blood pressure 125/77, pulse 66, temperature 97.6 F (36.4 C), temperature source Oral, resp. rate 18, height 5\' 5"  (1.651 m), weight (!) 164.2 kg, SpO2 100 %.Body mass index is 60.24 kg/m.  General Appearance: Casual, obese  Eye Contact:  fair  Speech: soft  Volume:  Normal  Mood:  Anxious and Depressed  Affect:  restricted  Thought Process:  concrete  Orientation:  Full (Time, Place, and Person)  Thought Content:  Hallucinations: Auditory Command:  voices telling him to kill himself and others  Suicidal Thoughts:  Yes.  without intent/plan  Homicidal Thoughts: intermittent, no intent  Memory:  Immediate;   Good Recent;   Good Remote;   Good  Judgement:  Impaired  Insight:  Lacking  Psychomotor Activity:  Normal  Concentration:  Concentration: Good and Attention Span: Good  Recall:  Good  Fund of Knowledge:  Good  Language:  Good  Akathisia:  No  Handed:  Right  AIMS (if indicated):     Assets:  Communication Skills Desire for Improvement Housing  ADL's:  Intact  Cognition:  WNL  Sleep:          Treatment Plan Summary: Daily contact with patient to assess and evaluate symptoms and progress in treatment and Medication management Pt with schizoaffective d/o , substance use issues admitted for worsening psychosis, CAH telling him to kill others and self. Pt on haldol dec, will verify dose and last  shot. Cont po haldol.  Group/milieu tx.  Pt wanting rehab upon d/c from hospital.   Observation Level/Precautions:  15 minute checks  Laboratory:  CBC Chemistry Profile HCG UDS UA  Psychotherapy:  Medications:    Consultations:    Discharge Concerns:    Estimated LOS:  Other:     Physician Treatment Plan for Primary Diagnosis: Schizoaffective disorder, depressive type (HCC) Long Term Goal(s): Improvement in symptoms so as ready for discharge  Short Term Goals: Ability to identify changes in lifestyle to reduce recurrence of condition will improve, Ability to verbalize feelings will improve, Ability to disclose and discuss suicidal ideas, Ability to demonstrate self-control will improve, Ability to identify and develop effective coping behaviors will improve, Ability to maintain clinical measurements within normal limits will improve, Compliance with prescribed medications will improve and Ability to identify triggers associated with substance abuse/mental health issues will improve  Physician Treatment Plan for Secondary Diagnosis: Principal Problem:   Schizoaffective disorder, depressive type (HCC) Active Problems:   Suicidal ideations   Cannabis use disorder, mild, abuse   Cocaine use disorder, severe, dependence (HCC)   Tobacco use disorder   HTN (hypertension)  Long Term Goal(s): Improvement in symptoms so as ready for discharge  Short Term Goals: Ability to identify changes in lifestyle to reduce recurrence of condition will improve, Ability to verbalize feelings will improve, Ability to disclose and discuss suicidal ideas, Ability to demonstrate self-control will improve, Ability to identify and develop effective coping behaviors will improve, Ability to maintain clinical measurements within normal limits will improve, Compliance with prescribed medications will improve and Ability to identify triggers associated with substance abuse/mental health issues will improve  I  certify that inpatient services furnished can reasonably be expected to improve the patient's condition.    Beverly Sessions, MD 10/19/20193:16 PM

## 2018-06-11 MED ORDER — TRAZODONE HCL 50 MG PO TABS
50.0000 mg | ORAL_TABLET | Freq: Every day | ORAL | Status: DC
  Administered 2018-06-11 – 2018-06-15 (×5): 50 mg via ORAL
  Filled 2018-06-11 (×5): qty 1

## 2018-06-11 MED ORDER — TRAZODONE HCL 50 MG PO TABS
50.0000 mg | ORAL_TABLET | Freq: Every evening | ORAL | Status: DC | PRN
  Administered 2018-06-12: 50 mg via ORAL
  Filled 2018-06-11: qty 1

## 2018-06-11 NOTE — Progress Notes (Signed)
[facility] MD Progress Note  06/11/2018 11:29 AM Bruce [name]  MRN:  [id number] Subjective:   Pt is 42 y/ o M with h/o schizoaffective d/o, referred by [place] for worsening psychosis, CAH, SI. I still hear voices" Pt calm, cooperative, endorses hearing voices to kill self, pt vague about intentions of hurting self, no plan, contracts for safety. Pt med compliant, tolerating well. Reports poor sleep Principal Problem: Schizoaffective disorder, depressive type (HCC) Diagnosis:   Patient Active Problem List   Diagnosis Date Noted  . Tobacco use disorder [F17.200] 06/09/2018  . HTN (hypertension) [I10] 06/09/2018  . Schizoaffective disorder, depressive type (HCC) [F25.1] 08/04/2017  . 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 Polysubstance abuse (HCC) [F19.10] 02/27/2017  . Auditory hallucinations [R44.0] 02/27/2017  . Substance induced mood disorder (HCC) [F19.94] 02/27/2017  . Cocaine use disorder, severe, dependence (HCC) [F14.20] 01/05/2017  . Cocaine-induced mood disorder (HCC) [F14.94] 01/05/2017  . Cocaine use disorder, mild, abuse (HCC) [F14.10] 05/03/2016  . Cannabis use disorder, mild, abuse [F12.10] 05/03/2016  . Suicidal ideations [R45.851] 04/30/2016  . Paranoid schizophrenia (HCC) [F20.0]   . 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 Alcohol use disorder, moderate, dependence (HCC) [F10.20] 09/22/2015  . Morbid obesity (HCC) [E66.01] 09/22/2015   Total Time spent with patient: 25 min  Past Psychiatric History: see H & P  Past Medical History:  Past Medical History:  Diagnosis Date  . Depression   . Obesity   . Schizophrenia (HCC)    History reviewed. No pertinent surgical history. Family History:  Family History  Problem Relation Age of Onset  . Schizophrenia Mother    Family Psychiatric  History: see H & P Social History:  Social History   Substance and Sexual Activity  Alcohol Use No     Social History   Substance and Sexual  Activity  Drug Use Yes  . Types: Marijuana, "Crack" cocaine, Cocaine   Comment: "in the past"    Social History   Socioeconomic History  . Marital status: Single    Spouse name: Not on file  . Number of children: Not on file  . Years of education: Not on file  . Highest education level: Not on file  Occupational History  . Not on file  Social Needs  . Financial resource strain: Not on file  . Food insecurity:    Worry: Not on file    Inability: Not on file  . Transportation needs:    Medical: Not on file    Non-medical: Not on file  Tobacco Use  . Smoking status: Current Every Day Smoker    Packs/day: 0.50    Types: Cigarettes  . Smokeless tobacco: Never Used  Substance and Sexual Activity  . Alcohol use: No  . Drug use: Yes    Types: Marijuana, "Crack" cocaine, Cocaine    Comment: "in the past"  . Sexual activity: Not Currently  Lifestyle  . Physical activity:    Days per week: Not on file    Minutes per session: Not on file  . Stress: Not on file  Relationships  . Social connections:    Talks on phone: Not on file    Gets together: Not on file    Attends religious service: Not on file    Active member of club or organization: Not on file    Attends meetings of clubs or organizations: Not on file    Relationship status: Not on file  Other Topics Concern  . Not on file  Social History Narrative  . Not on file   Additional Social History:                         Sleep: Poor  Appetite:  Fair  Current Medications: Current Facility-Administered Medications  Medication Dose Route Frequency Provider Last Rate Last Dose  . acetaminophen (TYLENOL) tablet 650 mg  650 mg Oral Q6H PRN Pucilowska, Jolanta B, MD   650 mg at 06/10/18 0648  . alum & mag hydroxide-simeth (MAALOX/MYLANTA) 200-200-20 MG/5ML suspension 30 mL  30 mL Oral Q4H PRN Pucilowska, Jolanta B, MD      . amLODipine (NORVASC) tablet 5 mg  5 mg Oral Daily Pucilowska, Jolanta B, MD   5 mg at  06/11/18 0820  . haloperidol (HALDOL) tablet 10 mg  10 mg Oral QHS Pucilowska, Jolanta B, MD   10 mg at 06/10/18 2217  . magnesium hydroxide (MILK OF MAGNESIA) suspension 30 mL  30 mL Oral Daily PRN Pucilowska, Jolanta B, MD      . nicotine (NICODERM CQ - dosed in mg/24 hours) patch 21 mg  21 mg Transdermal Q0600 Pucilowska, Jolanta B, MD   21 mg at 06/10/18 0646  . traZODone (DESYREL) tablet 100 mg  100 mg Oral QHS PRN Pucilowska, Jolanta B, MD        Lab Results: No results found for this or any previous visit (from the past 48 hour(s)).  Blood Alcohol level:  Lab Results  Component Value Date   ETH <10 06/07/2018   ETH <10 11/07/2017    Metabolic Disorder Labs: Lab Results  Component Value Date   HGBA1C 5.2 10/12/2016   MPG 103 10/12/2016   MPG 108 09/26/2015   Lab Results  Component Value Date   PROLACTIN 63.9 (H) 10/12/2016   PROLACTIN 19.9 (H) 05/06/2016   Lab Results  Component Value Date   CHOL 148 08/05/2017   TRIG 73 08/05/2017   HDL 40 (L) 08/05/2017   CHOLHDL 3.7 08/05/2017   VLDL 15 08/05/2017   LDLCALC 93 08/05/2017   LDLCALC 88 10/12/2016    Physical Findings: AIMS: Facial and Oral Movements Muscles of Facial Expression: None, normal Lips and Perioral Area: None, normal Jaw: None, normal Tongue: None, normal,Extremity Movements Upper (arms, wrists, hands, fingers): None, normal Lower (legs, knees, ankles, toes): None, normal, Trunk Movements Neck, shoulders, hips: None, normal, Overall Severity Severity of abnormal movements (highest score from questions above): None, normal Incapacitation due to abnormal movements: None, normal Patient's awareness of abnormal movements (rate only patient's report): No Awareness, Dental Status Current problems with teeth and/or dentures?: No Does patient usually wear dentures?: No  CIWA:  CIWA-Ar Total: 2 COWS:     Musculoskeletal: Strength & Muscle Tone: within normal limits Gait & Station: normal Patient  leans:   Psychiatric Specialty Exam: Physical Exam  Nursing note and vitals reviewed.   ROS  Blood pressure 107/70, pulse 66, temperature (!) 97.5 F (36.4 C), temperature source Oral, resp. rate 16, height 5\' 5"  (1.651 m), weight (!) 164.2 kg, SpO2 100 %.Body mass index is 60.24 kg/m.  General Appearance:Casual, obese  Eye Contact:fair  Speech:soft  Volume:Normal  Mood:Anxious and Depressed  Affect:restricted  Thought Process:concrete  Orientation:Full (Time, Place, and Person)  Thought Content:Hallucinations:Auditory Command:voices telling him to kill himself   Suicidal Thoughts:intermittent SI  Homicidal Thoughts:denies  Memory:Immediate;Good Recent;Good Remote;Good  Judgement:Impaired  Insight:Lacking  Psychomotor Activity:Normal  Concentration:Concentration:Goodand Attention Span: Good  Recall:Good  Fund of Knowledge:Good  Language:Good  Akathisia:No  Handed:Right  AIMS (if indicated):   Assets:Communication Skills Desire for Improvement Housing  ADL's:Intact  Cognition:WNL  Sleep: 6 hrs     Treatment Plan Summary: Daily contact with patient to assess and evaluate symptoms and progress in treatment and Medication management Pt with schizoaffective d/o , substance use issues admitted for worsening psychosis, CAH telling him to kill others and self. Pt on haldol dec, will verify dose and last shot. Cont po haldol. trazodone for sleep Group/milieu tx.  Pt wanting rehab upon d/c from hospital  Beverly Sessions, MD 06/11/2018, 11:29 AM

## 2018-06-11 NOTE — [facility] Group Notes (Signed)
LCSW Group Therapy Note 06/11/2018 1:15pm  Type of Therapy and Topic: Group Therapy: Feelings Around Returning Home & Establishing a Supportive Framework and Supporting Oneself When Supports Not Available  Participation Level: Did Not Attend  Description of Group:  Patients first processed thoughts and feelings about upcoming discharge. These included fears of upcoming changes, lack of change, new living environments, judgements and expectations from others and overall stigma of mental health issues. The group then discussed the definition of a supportive framework, what that looks and feels like, and how do to discern it from an unhealthy non-supportive network. The group identified different types of supports as well as what to do when your family/friends are less than helpful or unavailable  Therapeutic Goals  1. Patient will identify one healthy supportive network that they can use at discharge. 2. Patient will identify one factor of a supportive framework and how to tell it from an unhealthy network. 3. Patient able to identify one coping skill to use when they do not have positive supports from others. 4. Patient will demonstrate ability to communicate their needs through discussion and/or role plays.  Summary of Patient Progress:  Pt was invited to attend group but chose not to attend. CSW will continue to encourage pt to attend group throughout their admission.   Therapeutic Modalities Cognitive Behavioral Therapy Motivational Interviewing   Mayukha Symmonds  CUEBAS-COLON, LCSW 06/11/2018 12:03 PM 

## 2018-06-11 NOTE — Plan of Care (Signed)
Patient alert and oriented x 4. Pleasant upon approach but remains isolative to himself in his room. Patient compliant with medications and meals. Reports that he slept fair without the use of an sleep aid. Appetite good with low energy and good concentration. Rates his depression, anxiety and hopelessness a 6. His goal is to be less stressful. Report hearing voices that are commanding to harm himself. Verbally contracts for safety. Will continue to monitor.

## 2018-06-11 NOTE — [facility] Counselor (Signed)
Adult Comprehensive Assessment  Patient ID: Bruce [name], male   DOB: 1976-03-08, 42 y.o.   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" Employment / Job issues: "hard to keep a job for a long period or time" Family Relationships: "not goodEngineer, petroleum / Lack of resources (include bankruptcy): disability Housing / Lack of housing: stays at [employer] Physical health (include injuries & life threatening diseases): none reported Social relationships: "okay" Substance abuse: Cocaine, Cannabis, ETOH Bereavement / Loss: maternal grandparents  Living/Environment/Situation:  Living Arrangements: Alone Who else lives in the home?: n/a How long has patient lived in current situation?: 4 months What is atmosphere in current home: Comfortable  Family History:  Marital status: Single Are you sexually active?: No What is your sexual orientation?: heterosexual Does patient have children?: No  Childhood History:  By whom was/is the patient raised?: Mother Additional childhood history information: Pt reports having a good childhood overall.  Pt states that they didn't have much but mom did her best.  Description of patient's relationship with caregiver when they were a child: Pt reports getting along well with mother growing up.  Patient's description of current relationship with people who raised him/her: "I havent seen my mom in 3 years" How were you disciplined when you got in trouble as a child/adolescent?: punishments Does patient have siblings?: No Did patient suffer any verbal/emotional/physical/sexual abuse as a child?: No Did patient suffer from severe childhood  neglect?: No Has patient ever been sexually abused/assaulted/raped as an adolescent or adult?: No Was the patient ever a victim of a crime or a disaster?: No Witnessed domestic violence?: No Has patient been effected by domestic violence as an adult?: No  Education:  Highest grade of school patient has completed: 10 th grade Currently a student?: No Learning disability?: Yes What learning problems does patient have?: "low processing speed"  Employment/Work Situation:   Employment situation: On disability Why is patient on disability: mental health- schizoaffective disorder How long has patient been on disability: 2 years Patient's job has been impacted by current illness: No What is the longest time patient has a held a job?: 2.5 years 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?: No Are There Guns or Other Weapons in Your Home?: Yes Types of Guns/Weapons: "I own 2-52mm guns, they are in my nightstand" Are These Weapons Safely Secured?: No Who Could Verify You Are Able To Have These Secured:: "no oneChartered certified accountant Resources:   Financial resources: [employer], [employer], Food stamps Does patient have a representative payee or guardian?: No  Alcohol/Substance Abuse:   What has been your use of drugs/alcohol within the last 12 months?: Cocaine- daily, THC- 1X weekly, ETOH-2X monthly If attempted suicide, did drugs/alcohol play a role in this?: Yes Alcohol/Substance Abuse Treatment Hx: Past Tx, Inpatient, Attends AA/NA Has alcohol/substance abuse ever caused legal problems?: No  Social Support System:   Conservation officer, nature Support System: None Describe Community Support System: n/a Type of faith/religion: Baptist How does patient's faith help to cope with current illness?: praying  Leisure/Recreation:   Leisure and Hobbies: cooking, cleaning, watching TV  Strengths/Needs:    What is the patient's  perception of their strengths?: "cooking, getting along with people" Patient states they can use these personal strengths during their treatment to contribute to their recovery: "being more ope to treatment by understanding what is going on with me" Patient states these barriers may affect/interfere with their treatment: none reported Patient states these barriers may affect their return to the community: none reported  Discharge Plan:   Currently receiving community mental health services: No Patient states concerns and preferences for aftercare planning are: TBD with CSW- pt wishes to go to a Rehab Facility in [place] Patient states they will know when they are safe and ready for discharge when: "when I calm down to a certaing point" Does patient have access to transportation?: No Does patient have financial barriers related to discharge medications?: No Plan for no access to transportation at discharge: TBD with CSW- pt needs help with transportation Plan for living situation after discharge: TBD with CSW- pt does not want to return to his apartment, he would like to attend rehab after discharge or find another apartment Will patient be returning to same living situation after discharge?: No  Summary/Recommendations:   Summary and Recommendations (to be completed by the evaluator): Patient is a 42 year old male admitted voluntarily and diagnosed with Schizoaffective disorder, depressive type (HCC). with h/o schizoaffective d/o, referred by [facility]. Per report, pt worsening psychosis, he feels like someone spiked his alcohol which made his auditory hallucinations worse, hearing voices telling him to kill others and kill himself.  pt reportedly voiced "I got 2 9 mm in my nightstand drawer; When I get out of here I going to kill them two that spiked my drink; because everybody knows what problem I have with cocaine; then I'm going to kill myself and a place in hell will be  waiting on me. I already feel like the devil in the room with me; I am feeling all hot and sweating and stuff."  Pt admits hearing voices and feeling suicidal for about 2 weeks, states he stopped taking pill Haldol about 2 weeks ago. Patient will benefit from crisis stabilization, medication evaluation, group therapy and psychoeducation. In addition to case management for discharge planning. At discharge it is recommended that patient adhere to the established discharge plan and continue treatment.   Noriel Guthrie  CUEBAS-COLON. 06/11/2018

## 2018-06-11 NOTE — Plan of Care (Signed)
Cooperative but mostly isolative in room.

## 2018-06-12 DIAGNOSIS — Z765 Malingerer [conscious simulation]: Secondary | ICD-10-CM

## 2018-06-12 MED ORDER — BENZTROPINE MESYLATE 1 MG PO TABS
1.0000 mg | ORAL_TABLET | Freq: Every day | ORAL | Status: DC
  Administered 2018-06-12 – 2018-06-15 (×4): 1 mg via ORAL
  Filled 2018-06-12 (×4): qty 1

## 2018-06-12 NOTE — Progress Notes (Signed)
Recreation Therapy Notes  INPATIENT RECREATION THERAPY ASSESSMENT  Patient Details Name: Bruce [name] MRN: [id number] DOB: [birth year] Today's Date: 06/12/2018       Information Obtained From: Patient  Able to Participate in Assessment/Interview: Yes  Patient Presentation: Responsive  Reason for Admission (Per Patient): Active Symptoms, Suicidal Ideation, Med Non-Compliance  Patient Stressors:    Coping Skills:   Film/video editor, Music, Avoidance  Leisure Interests (2+):  Individual - TV, Sports - Football  Frequency of Recreation/Participation: Weekly  Awareness of Community Resources:  Yes  Community Resources:  Library  Current Use:    If no, Barriers?:    Expressed Interest in [employer] Information:    [place] of Residence:  [place]  Patient Main Form of Transportation: Therapist, music  Patient Strengths:  I get along with every one  Patient Identified Areas of Improvement:  Talk and express myself more  Patient Goal for Hospitalization:  To get stable and focused  Current SI (including self-harm):  Yes(The voices are tell me to hurt mysself and others. I can contract for saftey while here)  Current HI:  Yes  Current AVH: Yes  Staff Intervention Plan: Group Attendance, Collaborate with Interdisciplinary Treatment Team  Consent to Intern Participation: N/A  Kariann Wecker 06/12/2018, 4:15 PM

## 2018-06-12 NOTE — Progress Notes (Signed)
[facility] MD Progress Note  06/12/2018 12:06 PM Terrace [place]  MRN:  [id number] Subjective:  Pt has brighter affect. He is out in the milieu and socializing with peers. He states that he still hears voices "telling me to kill myself and stuff." He states that they are a little quieter and less distressing. He also reports that he is still suicidal but denies a plan. He reports vague homicidal thoughts towards "these guys in my neighborhood that made me use drugs." He denies any intent or plan to harm them. He also states that if he were to harm them he would go back to prison and states that that is somewhere he does not want to return.  He states that he does not want to return to his apartment or his neighborhood because there are a lot of people that are bad influences on him. He states that he wants a residential treatment program for drug use however he declines many options put forth to him including [facility], [facility] (he states that he tried this one and doesn't want to work). He states that he wants something in [place] or [place] Discussed that if he really wants treatment he can't be too picky on where he goes. He also states that he has been banned from a lot of shelters due to "drinking and using drugs." He is adamant that he does not want to return to his apartment and plans to look for another apartment.  Principal Problem: Schizoaffective disorder, depressive type (HCC) Diagnosis:   Patient Active Problem List   Diagnosis Date Noted  . Schizoaffective disorder, depressive type (HCC) [F25.1] 08/04/2017    Priority: High  . Cocaine use disorder, severe, dependence (HCC) [F14.20] 01/05/2017    Priority: High  . Malingering [Z76.5] 06/12/2018  . Tobacco use disorder [F17.200] 06/09/2018  . HTN (hypertension) [I10] 06/09/2018  . 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 Polysubstance abuse (HCC) [F19.10] 02/27/2017  . Substance induced mood disorder (HCC) [F19.94]  02/27/2017  . Cocaine-induced mood disorder (HCC) [F14.94] 01/05/2017  . Cocaine use disorder, mild, abuse (HCC) [F14.10] 05/03/2016  . Cannabis use disorder, mild, abuse [F12.10] 05/03/2016  . Suicidal ideations [R45.851] 04/30/2016  . 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 Alcohol use disorder, moderate, dependence (HCC) [F10.20] 09/22/2015  . Morbid obesity (HCC) [E66.01] 09/22/2015   Total Time spent with patient: 30 minutes  Past Psychiatric History: See H&P  Past Medical History:  Past Medical History:  Diagnosis Date  . Depression   . Obesity   . Schizophrenia (HCC)    History reviewed. No pertinent surgical history. Family History:  Family History  Problem Relation Age of Onset  . Schizophrenia Mother    Family Psychiatric  History: See H&P Social History:  Social History   Substance and Sexual Activity  Alcohol Use No     Social History   Substance and Sexual Activity  Drug Use Yes  . Types: Marijuana, "Crack" cocaine, Cocaine   Comment: "in the past"    Social History   Socioeconomic History  . Marital status: Single    Spouse name: Not on file  . Number of children: Not on file  . Years of education: Not on file  . Highest education level: Not on file  Occupational History  . Not on file  Social Needs  . Financial resource strain: Not on file  . Food insecurity:    Worry: Not on file    Inability: Not on file  . Transportation  needs:    Medical: Not on file    Non-medical: Not on file  Tobacco Use  . Smoking status: Current Every Day Smoker    Packs/day: 0.50    Types: Cigarettes  . Smokeless tobacco: Never Used  Substance and Sexual Activity  . Alcohol use: No  . Drug use: Yes    Types: Marijuana, "Crack" cocaine, Cocaine    Comment: "in the past"  . Sexual activity: Not Currently  Lifestyle  . Physical activity:    Days per week: Not on file    Minutes per session: Not on file  . Stress: Not on file  Relationships  .  Social connections:    Talks on phone: Not on file    Gets together: Not on file    Attends religious service: Not on file    Active member of club or organization: Not on file    Attends meetings of clubs or organizations: Not on file    Relationship status: Not on file  Other Topics Concern  . Not on file  Social History Narrative  . Not on file   Additional Social History:                         Sleep: Good  Appetite:  Good  Current Medications: Current Facility-Administered Medications  Medication Dose Route Frequency Provider Last Rate Last Dose  . acetaminophen (TYLENOL) tablet 650 mg  650 mg Oral Q6H PRN Pucilowska, Jolanta B, MD   650 mg at 06/10/18 0648  . alum & mag hydroxide-simeth (MAALOX/MYLANTA) 200-200-20 MG/5ML suspension 30 mL  30 mL Oral Q4H PRN Pucilowska, Jolanta B, MD      . amLODipine (NORVASC) tablet 5 mg  5 mg Oral Daily Pucilowska, Jolanta B, MD   5 mg at 06/12/18 0809  . haloperidol (HALDOL) tablet 10 mg  10 mg Oral QHS Pucilowska, Jolanta B, MD   10 mg at 06/11/18 2152  . magnesium hydroxide (MILK OF MAGNESIA) suspension 30 mL  30 mL Oral Daily PRN Pucilowska, Jolanta B, MD      . nicotine (NICODERM CQ - dosed in mg/24 hours) patch 21 mg  21 mg Transdermal Q0600 Pucilowska, Jolanta B, MD   21 mg at 06/10/18 0646  . traZODone (DESYREL) tablet 50 mg  50 mg Oral QHS PRN Beverly Sessions, MD      . traZODone (DESYREL) tablet 50 mg  50 mg Oral QHS Beverly Sessions, MD   50 mg at 06/11/18 2152    Lab Results: No results found for this or any previous visit (from the past 48 hour(s)).  Blood Alcohol level:  Lab Results  Component Value Date   ETH <10 06/07/2018   ETH <10 11/07/2017    Metabolic Disorder Labs: Lab Results  Component Value Date   HGBA1C 5.2 10/12/2016   MPG 103 10/12/2016   MPG 108 09/26/2015   Lab Results  Component Value Date   PROLACTIN 63.9 (H) 10/12/2016   PROLACTIN 19.9 (H) 05/06/2016   Lab Results  Component  Value Date   CHOL 148 08/05/2017   TRIG 73 08/05/2017   HDL 40 (L) 08/05/2017   CHOLHDL 3.7 08/05/2017   VLDL 15 08/05/2017   LDLCALC 93 08/05/2017   LDLCALC 88 10/12/2016    Physical Findings: AIMS: Facial and Oral Movements Muscles of Facial Expression: None, normal Lips and Perioral Area: None, normal Jaw: None, normal Tongue: None, normal,Extremity Movements Upper (arms, wrists, hands, fingers): None,  normal Lower (legs, knees, ankles, toes): None, normal, Trunk Movements Neck, shoulders, hips: None, normal, Overall Severity Severity of abnormal movements (highest score from questions above): None, normal Incapacitation due to abnormal movements: None, normal Patient's awareness of abnormal movements (rate only patient's report): No Awareness, Dental Status Current problems with teeth and/or dentures?: No Does patient usually wear dentures?: No  CIWA:  CIWA-Ar Total: 2 COWS:     Musculoskeletal: Strength & Muscle Tone: within normal limits Gait & Station: normal Patient leans: N/A  Psychiatric Specialty Exam: Physical Exam  Nursing note and vitals reviewed.   Review of Systems  All other systems reviewed and are negative.   Blood pressure 137/83, pulse 73, temperature 98.1 F (36.7 C), temperature source Oral, resp. rate 18, height 5\' 5"  (1.651 m), weight (!) 164.2 kg, SpO2 99 %.Body mass index is 60.24 kg/m.  General Appearance: Casual  Eye Contact:  Good  Speech:  Clear and Coherent  Volume:  Normal  Mood:  Depressed  Affect:  Congruent  Thought Process:  Coherent and Goal Directed  Orientation:  Full (Time, Place, and Person)  Thought Content:  Hallucinations: Auditory  Suicidal Thoughts:  Yes.  without intent/plan  Homicidal Thoughts:  Yes.  without intent/plan  Memory:  Immediate;   Fair  Judgement:  Impaired  Insight:  Lacking  Psychomotor Activity:  Normal  Concentration:  Concentration: Fair  Recall:  [employer] of Knowledge:  Fair  Language:   Fair  Akathisia:  No      Assets:  Resilience  ADL's:  Intact  Cognition:  WNL  Sleep:  Number of Hours: 6.45     Treatment Plan Summary: 42 yo male admitted due to AH, SI, and HI. Pt reports slightly improvement in voices. He does not appear to be responding to internal stimuli, he is fully organized in thoughts. And no delay in responses. He states very nonchalantly that they tell him to kill himself but is not in any distress regarding them. He is unable to elaborate anymore on them.   He is very vague about why he does not want to return to his apartment other than he does not like his neighborhood because there are a lot of drugs. Possibly he owes some people money.  He is also stating he wants residential treatment for drugs but declines many options put forth to him. His motivation for sobriety is questionable. Per chart review, he has chronic SI and chronically voices AH. He rarely is compliant with medications or follow up. I feel there is possibly a component of malingering for housing related issues. He does not appear psychotic at all. He has been bright and social with peers on the unit.  He has been banned from many shelters. CSW to look into a couple residential treatment options such as [facility] which he prefers.   Plan:  Psychosis -Continue Haldol 10 mg qhs. Discussed risks and side effects including EPS and TD. He states that Haldol has been helpful and wants to continue it. He is also open to getting back on Haldol Dec. He states that it has been several months since last injection. Per chart review, he was on Invega and not Haldol. -Start Cogentin 1 mg qhs  Insomnia -Trazodone prn  Cocaine use disorder -CSW to look into residential options  Dispo -Destination unclear at this time.  Haskell Riling, MD 06/12/2018, 12:06 PM

## 2018-06-12 NOTE — Progress Notes (Signed)
   06/12/18 1030  Clinical Encounter Type  Visited With Patient;Health care provider  Visit Type Initial;Spiritual support;Behavioral Health  Referral From Social work  Consult/Referral To Chaplain  Spiritual Encounters  Spiritual Needs Emotional;Other (Comment)   CH attended the patient's treatment team meeting. The patient set a goal of getting stable and get a shower. Bruce [name] complained that his shower is broken.

## 2018-06-12 NOTE — Plan of Care (Signed)
More visible in the milieu. Compliant with treatment

## 2018-06-12 NOTE — Progress Notes (Signed)
Recreation Therapy Notes  Date: 06/12/2018  Time: 9:30 am   Location: Craft room   Behavioral response: N/A   Intervention Topic: Stress  Discussion/Intervention: Patient did not attend group.   Clinical Observations/Feedback:  Patient did not attend group.   Amna Welker LRT/CTRS        Antiono Ettinger 06/12/2018 12:24 PM

## 2018-06-12 NOTE — Progress Notes (Signed)
Patient was more visible in the milieu until bed time. Upon the beginning of this shift, patient was sitting in the dayroom, watching TV and talking to peers. Pleasant and cooperative. Patient had a snack and presented to the medication room. Reported that the voices are still present but less intense and not threatening. Had no other concerns. Received medications and went to bed. Currently sleeping and safety monitored.

## 2018-06-12 NOTE — Plan of Care (Signed)
Patient alert and oriented x 4. Pleasant upon approach but remains isolative to himself in his room. Patient compliant with medications and meals. Reports that he slept fair without the use of an sleep aid. Appetite good with low energy and good concentration. Rates his depression, anxiety and hopelessness a 6. His goal is to take more time for himself and to talk to staff about how he feels. Report hearing voices that are commanding to harm himself. Verbally contracts for safety. Will continue to monitor.

## 2018-06-12 NOTE — Tx Team (Addendum)
Interdisciplinary Treatment and Diagnostic Plan Update  06/12/2018 Time of Session: 11am Bruce [name] MRN: [id number]  Principal Diagnosis: Schizoaffective disorder, depressive type (HCC)  Secondary Diagnoses: Principal Problem:   Schizoaffective disorder, depressive type (HCC) Active Problems:   Suicidal ideations   Cannabis use disorder, mild, abuse   Cocaine use disorder, severe, dependence (HCC)   Tobacco use disorder   HTN (hypertension)   Current Medications:  Current Facility-Administered Medications  Medication Dose Route Frequency Provider Last Rate Last Dose  . acetaminophen (TYLENOL) tablet 650 mg  650 mg Oral Q6H PRN Pucilowska, Jolanta B, MD   650 mg at 06/10/18 0648  . alum & mag hydroxide-simeth (MAALOX/MYLANTA) 200-200-20 MG/5ML suspension 30 mL  30 mL Oral Q4H PRN Pucilowska, Jolanta B, MD      . amLODipine (NORVASC) tablet 5 mg  5 mg Oral Daily Pucilowska, Jolanta B, MD   5 mg at 06/12/18 0809  . haloperidol (HALDOL) tablet 10 mg  10 mg Oral QHS Pucilowska, Jolanta B, MD   10 mg at 06/11/18 2152  . magnesium hydroxide (MILK OF MAGNESIA) suspension 30 mL  30 mL Oral Daily PRN Pucilowska, Jolanta B, MD      . nicotine (NICODERM CQ - dosed in mg/24 hours) patch 21 mg  21 mg Transdermal Q0600 Pucilowska, Jolanta B, MD   21 mg at 06/10/18 0646  . traZODone (DESYREL) tablet 50 mg  50 mg Oral QHS PRN Beverly Sessions, MD      . traZODone (DESYREL) tablet 50 mg  50 mg Oral QHS Beverly Sessions, MD   50 mg at 06/11/18 2152   PTA Medications: Medications Prior to Admission  Medication Sig Dispense Refill Last Dose  . amLODipine (NORVASC) 5 MG tablet Take 1 tablet (5 mg total) by mouth daily. For high blood pressure (Patient not taking: Reported on 11/08/2017) 10 tablet 0 Not Taking at Unknown time  . benztropine (COGENTIN) 0.5 MG tablet Take 1 tablet (0.5 mg total) by mouth 2 (two) times daily with a meal. For prevention of drug induced trmors (Patient not taking: Reported  on 11/08/2017) 60 tablet 0 Not Taking at Unknown time  . hydrOXYzine (ATARAX/VISTARIL) 50 MG tablet Take 1 tablet (50 mg total) by mouth every 6 (six) hours as needed for anxiety (sleep). (Patient not taking: Reported on 11/08/2017) 60 tablet 0 Not Taking at Unknown time  . ibuprofen (ADVIL,MOTRIN) 200 MG tablet Take 200 mg by mouth every 6 (six) hours as needed for moderate pain.   unk at prn  . paliperidone (INVEGA) 6 MG 24 hr tablet Take 1 tablet (6 mg total) by mouth daily. For mood control (Patient not taking: Reported on 11/08/2017) 30 tablet 0 Not Taking at Unknown time    Patient Stressors: Financial difficulties Marital or family conflict Occupational concerns Substance abuse  Patient Strengths: Average or above average intelligence Capable of independent living Manufacturing systems engineer Motivation for treatment/growth  Treatment Modalities: Medication Management, Group therapy, Case management,  1 to 1 session with clinician, Psychoeducation, Recreational therapy.   Physician Treatment Plan for Primary Diagnosis: Schizoaffective disorder, depressive type (HCC) Long Term Goal(s): Improvement in symptoms so as ready for discharge Improvement in symptoms so as ready for discharge   Short Term Goals: Ability to identify changes in lifestyle to reduce recurrence of condition will improve Ability to verbalize feelings will improve Ability to disclose and discuss suicidal ideas Ability to demonstrate self-control will improve Ability to identify and develop effective coping behaviors will improve Ability to maintain clinical  measurements within normal limits will improve Compliance with prescribed medications will improve Ability to identify triggers associated with substance abuse/mental health issues will improve Ability to identify changes in lifestyle to reduce recurrence of condition will improve Ability to verbalize feelings will improve Ability to disclose and discuss suicidal  ideas Ability to demonstrate self-control will improve Ability to identify and develop effective coping behaviors will improve Ability to maintain clinical measurements within normal limits will improve Compliance with prescribed medications will improve Ability to identify triggers associated with substance abuse/mental health issues will improve  Medication Management: Evaluate patient's response, side effects, and tolerance of medication regimen.  Therapeutic Interventions: 1 to 1 sessions, Unit Group sessions and Medication administration.  Evaluation of Outcomes: Progressing  Physician Treatment Plan for Secondary Diagnosis: Principal Problem:   Schizoaffective disorder, depressive type (HCC) Active Problems:   Suicidal ideations   Cannabis use disorder, mild, abuse   Cocaine use disorder, severe, dependence (HCC)   Tobacco use disorder   HTN (hypertension)  Long Term Goal(s): Improvement in symptoms so as ready for discharge Improvement in symptoms so as ready for discharge   Short Term Goals: Ability to identify changes in lifestyle to reduce recurrence of condition will improve Ability to verbalize feelings will improve Ability to disclose and discuss suicidal ideas Ability to demonstrate self-control will improve Ability to identify and develop effective coping behaviors will improve Ability to maintain clinical measurements within normal limits will improve Compliance with prescribed medications will improve Ability to identify triggers associated with substance abuse/mental health issues will improve Ability to identify changes in lifestyle to reduce recurrence of condition will improve Ability to verbalize feelings will improve Ability to disclose and discuss suicidal ideas Ability to demonstrate self-control will improve Ability to identify and develop effective coping behaviors will improve Ability to maintain clinical measurements within normal limits will  improve Compliance with prescribed medications will improve Ability to identify triggers associated with substance abuse/mental health issues will improve     Medication Management: Evaluate patient's response, side effects, and tolerance of medication regimen.  Therapeutic Interventions: 1 to 1 sessions, Unit Group sessions and Medication administration.  Evaluation of Outcomes: Progressing   RN Treatment Plan for Primary Diagnosis: Schizoaffective disorder, depressive type (HCC) Long Term Goal(s): Knowledge of disease and therapeutic regimen to maintain health will improve  Short Term Goals: Ability to demonstrate self-control, Ability to identify and develop effective coping behaviors will improve and Compliance with prescribed medications will improve  Medication Management: RN will administer medications as ordered by provider, will assess and evaluate patient's response and provide education to patient for prescribed medication. RN will report any adverse and/or side effects to prescribing provider.  Therapeutic Interventions: 1 on 1 counseling sessions, Psychoeducation, Medication administration, Evaluate responses to treatment, Monitor vital signs and CBGs as ordered, Perform/monitor CIWA, COWS, AIMS and Fall Risk screenings as ordered, Perform wound care treatments as ordered.  Evaluation of Outcomes: Progressing   LCSW Treatment Plan for Primary Diagnosis: Schizoaffective disorder, depressive type (HCC) Long Term Goal(s): Safe transition to appropriate next level of care at discharge, Engage patient in therapeutic group addressing interpersonal concerns.  Short Term Goals: Engage patient in aftercare planning with referrals and resources, Increase social support, Facilitate patient progression through stages of change regarding substance use diagnoses and concerns, Identify triggers associated with mental health/substance abuse issues and Increase skills for wellness and  recovery  Therapeutic Interventions: Assess for all discharge needs, 1 to 1 time with Social worker, Explore available resources  and support systems, Assess for adequacy in community support network, Educate family and significant other(s) on suicide prevention, Complete Psychosocial Assessment, Interpersonal group therapy.  Evaluation of Outcomes: Progressing   Progress in Treatment: Attending groups: No. Participating in groups: No. Taking medication as prescribed: Yes. Toleration medication: Yes. Family/Significant other contact made: No, will contact:  Patient refused Patient understands diagnosis: Yes. Discussing patient identified problems/goals with staff: Yes. Medical problems stabilized or resolved: Yes. Denies suicidal/homicidal ideation: Yes. Issues/concerns per patient self-inventory: No. Other:   New problem(s) identified: No, Describe:  None  New Short Term/Long Term Goal(s): "To get my mind right and stay focused."  Patient Goals:   "To get my mind right and stay focused."  Discharge Plan or Barriers: To discharge to [facility] in [place].   Reason for Continuation of Hospitalization: Depression Medication stabilization  Estimated Length of Stay: 3-5 days   Recreational Therapy: Patient Stressors: N/A Patient Goal: Patient will engage in interactions with peers and staff in pro-social manner at least 2x within 5 recreation therapy group sessions  Attendees: Patient: Bruce [name] 06/12/2018 11:51 AM  Physician: Corinna Gab, MD 06/12/2018 11:51 AM  Nursing:  06/12/2018 11:51 AM  RN Care Manager: 06/12/2018 11:51 AM  Social Worker: Audree Camel 06/12/2018 11:51 AM  Recreational Therapist: Danella Deis. Dreama Saa, LRT 06/12/2018 11:51 AM  Other: Lowella Dandy, LCSW 06/12/2018 11:51 AM  Other: Damian Leavell, Chaplin 06/12/2018 11:51 AM  Other: 06/12/2018 11:51 AM    Scribe for Treatment Team: Johny Shears,  LCSW 06/12/2018 11:51 AM

## 2018-06-13 NOTE — Progress Notes (Signed)
Recreation Therapy Notes   Date: 06/13/2018  Time: 9:30 am   Location: Craft room   Behavioral response: N/A   Intervention Topic: Emotions  Discussion/Intervention: Patient did not attend group.   Clinical Observations/Feedback:  Patient did not attend group.   Jaelyn Cloninger LRT/CTRS        Kimmora Risenhoover 06/13/2018 10:41 AM

## 2018-06-13 NOTE — Progress Notes (Signed)
[facility] MD Progress Note  06/13/2018 1:33 PM Bruce [name]  MRN:  [id number] Subjective:  Pt reports that he still hears voices telling him to kill himself however they are even quieter today. He still feels suicidal but is very vague about this. Denies plan or intent. He is organized in thoughts. She smiles and laughs appropriately during interview. Does not appear depressed. HE is still wanting to try to pursue [facility] residential treatment. He states that he gets paid on 06/23/18. He wants to continue with oral Haldol instead of getting an injection.   Principal Problem: Schizoaffective disorder, depressive type (HCC) Diagnosis:   Patient Active Problem List   Diagnosis Date Noted  . Schizoaffective disorder, depressive type (HCC) [F25.1] 08/04/2017    Priority: High  . Cocaine use disorder, severe, dependence (HCC) [F14.20] 01/05/2017    Priority: High  . Malingering [Z76.5] 06/12/2018  . Tobacco use disorder [F17.200] 06/09/2018  . HTN (hypertension) [I10] 06/09/2018  . 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 Polysubstance abuse (HCC) [F19.10] 02/27/2017  . Substance induced mood disorder (HCC) [F19.94] 02/27/2017  . Cocaine-induced mood disorder (HCC) [F14.94] 01/05/2017  . Cocaine use disorder, mild, abuse (HCC) [F14.10] 05/03/2016  . Cannabis use disorder, mild, abuse [F12.10] 05/03/2016  . Suicidal ideations [R45.851] 04/30/2016  . 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 Alcohol use disorder, moderate, dependence (HCC) [F10.20] 09/22/2015  . Morbid obesity (HCC) [E66.01] 09/22/2015   Total Time spent with patient: 20 minutes  Past Psychiatric History: See H&P  Past Medical History:  Past Medical History:  Diagnosis Date  . Depression   . Obesity   . Schizophrenia (HCC)    History reviewed. No pertinent surgical history. Family History:  Family History  Problem Relation Age of Onset  . Schizophrenia Mother    Family Psychiatric  History:  See H&P Social History:  Social History   Substance and Sexual Activity  Alcohol Use No     Social History   Substance and Sexual Activity  Drug Use Yes  . Types: Marijuana, "Crack" cocaine, Cocaine   Comment: "in the past"    Social History   Socioeconomic History  . Marital status: Single    Spouse name: Not on file  . Number of children: Not on file  . Years of education: Not on file  . Highest education level: Not on file  Occupational History  . Not on file  Social Needs  . Financial resource strain: Not on file  . Food insecurity:    Worry: Not on file    Inability: Not on file  . Transportation needs:    Medical: Not on file    Non-medical: Not on file  Tobacco Use  . Smoking status: Current Every Day Smoker    Packs/day: 0.50    Types: Cigarettes  . Smokeless tobacco: Never Used  Substance and Sexual Activity  . Alcohol use: No  . Drug use: Yes    Types: Marijuana, "Crack" cocaine, Cocaine    Comment: "in the past"  . Sexual activity: Not Currently  Lifestyle  . Physical activity:    Days per week: Not on file    Minutes per session: Not on file  . Stress: Not on file  Relationships  . Social connections:    Talks on phone: Not on file    Gets together: Not on file    Attends religious service: Not on file    Active member of club or organization: Not on file  Attends meetings of clubs or organizations: Not on file    Relationship status: Not on file  Other Topics Concern  . Not on file  Social History Narrative  . Not on file   Additional Social History:                         Sleep: Fair  Appetite:  Good  Current Medications: Current Facility-Administered Medications  Medication Dose Route Frequency Provider Last Rate Last Dose  . acetaminophen (TYLENOL) tablet 650 mg  650 mg Oral Q6H PRN Pucilowska, Jolanta B, MD   650 mg at 06/10/18 0648  . alum & mag hydroxide-simeth (MAALOX/MYLANTA) 200-200-20 MG/5ML suspension 30 mL   30 mL Oral Q4H PRN Pucilowska, Jolanta B, MD      . amLODipine (NORVASC) tablet 5 mg  5 mg Oral Daily Pucilowska, Jolanta B, MD   5 mg at 06/13/18 0914  . benztropine (COGENTIN) tablet 1 mg  1 mg Oral QHS Zarria Towell R, MD   1 mg at 06/12/18 2127  . haloperidol (HALDOL) tablet 10 mg  10 mg Oral QHS Pucilowska, Jolanta B, MD   10 mg at 06/12/18 2127  . magnesium hydroxide (MILK OF MAGNESIA) suspension 30 mL  30 mL Oral Daily PRN Pucilowska, Jolanta B, MD      . nicotine (NICODERM CQ - dosed in mg/24 hours) patch 21 mg  21 mg Transdermal Q0600 Pucilowska, Jolanta B, MD   21 mg at 06/10/18 0646  . traZODone (DESYREL) tablet 50 mg  50 mg Oral QHS PRN Beverly Sessions, MD   50 mg at 06/12/18 2127  . traZODone (DESYREL) tablet 50 mg  50 mg Oral QHS Beverly Sessions, MD   50 mg at 06/12/18 2127    Lab Results: No results found for this or any previous visit (from the past 48 hour(s)).  Blood Alcohol level:  Lab Results  Component Value Date   ETH <10 06/07/2018   ETH <10 11/07/2017    Metabolic Disorder Labs: Lab Results  Component Value Date   HGBA1C 5.2 10/12/2016   MPG 103 10/12/2016   MPG 108 09/26/2015   Lab Results  Component Value Date   PROLACTIN 63.9 (H) 10/12/2016   PROLACTIN 19.9 (H) 05/06/2016   Lab Results  Component Value Date   CHOL 148 08/05/2017   TRIG 73 08/05/2017   HDL 40 (L) 08/05/2017   CHOLHDL 3.7 08/05/2017   VLDL 15 08/05/2017   LDLCALC 93 08/05/2017   LDLCALC 88 10/12/2016    Physical Findings: AIMS: Facial and Oral Movements Muscles of Facial Expression: None, normal Lips and Perioral Area: None, normal Jaw: None, normal Tongue: None, normal,Extremity Movements Upper (arms, wrists, hands, fingers): None, normal Lower (legs, knees, ankles, toes): None, normal, Trunk Movements Neck, shoulders, hips: None, normal, Overall Severity Severity of abnormal movements (highest score from questions above): None, normal Incapacitation due to abnormal  movements: None, normal Patient's awareness of abnormal movements (rate only patient's report): No Awareness, Dental Status Current problems with teeth and/or dentures?: No Does patient usually wear dentures?: No  CIWA:  CIWA-Ar Total: 2 COWS:     Musculoskeletal: Strength & Muscle Tone: within normal limits Gait & Station: normal Patient leans: N/A  Psychiatric Specialty Exam: Physical Exam  Nursing note and vitals reviewed.   Review of Systems  All other systems reviewed and are negative.   Blood pressure 112/83, pulse 97, temperature 98.4 F (36.9 C), temperature source Oral, resp. rate  16, height 5\' 5"  (1.651 m), weight (!) 164.2 kg, SpO2 100 %.Body mass index is 60.24 kg/m.  General Appearance: Casual  Eye Contact:  Good  Speech:  Clear and Coherent  Volume:  Normal  Mood:  Depressed  Affect:  Euthymic, smiling, happy appearing  Thought Process:  Coherent and Goal Directed  Orientation:  Full (Time, Place, and Person)  Thought Content:  Hallucinations: Auditory  Suicidal Thoughts:  Yes.  without intent/plan  Homicidal Thoughts:  Yes.  without intent/plan  Memory:  Immediate;   Fair  Judgement:  Fair  Insight:  Fair  Psychomotor Activity:  Normal  Concentration:  Concentration: Fair  Recall:  [employer] of Knowledge:  Fair  Language:  Fair  Akathisia:  No      Assets:  Resilience  ADL's:  Intact  Cognition:  WNL  Sleep:  Number of Hours: 8.5     Treatment Plan Summary: 42 yo male admitted due to AH, SI, and HI. He continues to report AH telling him to harm himself but states that they are improving and getting quieter. I continue to feel that he has component of malingering. Per staff that know his history, he has a pattern of coming to the hospital when he is out of money for the month. He has fully range affect and is very organized in his thoughts. He continues to desire [facility] residential treatment which is usually a walk in appointment. I think most of his  symptoms are related to drug use.   Plan:  Psychosis -Continue Haldol 10 mg qhs. He wants to continue oral at this time instead of getting Haldol dec which I think is reasonable at this time.  -Continue Cogentin 1 mg qhs  Insomnia -trazodone prn  Cocaine use disorder -He [facility] Haskell Riling, MD 06/13/2018, 1:33 PM

## 2018-06-13 NOTE — Progress Notes (Signed)
Received Bruce [name] after breakfast, he was compliant with his medications. He endorsed hearing voices telling him to self harm and harm others. He feels he can be safe here on the unit and will notify staff if the voices increase or urges to self harm himself and others. He has been assess throughout the day in the milieu without incident.

## 2018-06-13 NOTE — [facility] Group Notes (Signed)
  LCSW Group Therapy Note  Tuesday Oct 22/19 at 1:00pm  Type of Therapy/Topic:  Group Therapy:  Feelings about Diagnosis  Participation Level:  Patient did not attend- resting in room  Description of Group:   This group will allow patients to explore their thoughts and feelings about diagnoses they have received. Patients will be guided to explore their level of understanding and acceptance of these diagnoses. Facilitator will encourage patients to process their thoughts and feelings about the reactions of others to their diagnosis and will guide patients in identifying ways to discuss their diagnosis with significant others in their lives. This group will be process-oriented, with patients participating in exploration of their own experiences, giving and receiving support, and processing challenge from other group members.   Therapeutic Goals: 1. Patient will demonstrate understanding of diagnosis as evidenced by identifying two or more symptoms of the disorder 2. Patient will be able to express two feelings regarding the diagnosis 3. Patient will demonstrate their ability to communicate their needs through discussion and/or role play  Summary of Patient Progress:  Therapeutic Modalities:   Cognitive Behavioral Therapy Brief Therapy Feelings Identification    Wayburn Shaler LCSW [id number] 

## 2018-06-13 NOTE — Plan of Care (Addendum)
Patient found in bed awake upon my arrival. Patient is intermittently visible but not social this evening. Patient continues to report Ah. Reports voices that tell him to hurt himself. Reports that they are getting more easy to ignore. Reports SI is intermittent as well. Contracts for safety while on the unit. Patient reports no plan or intent. Reports eating and voiding adequately. Denies pain. Compliant with HS medications and staff direction. Q 15 minute checks maintained. Will continue to monitor throughout the shift. Patient slept 8.5 hours. No apparent distress. Will endorse care to oncoming shift  Problem: Self-Concept: Goal: Ability to disclose and discuss suicidal ideas will improve Outcome: Progressing   Problem: Safety: Goal: Ability to remain free from injury will improve Outcome: Progressing   Problem: Coping: Goal: Ability to identify and develop effective coping behavior will improve Outcome: Progressing Goal: Ability to interact with others will improve Outcome: Progressing Goal: Ability to use eye contact when communicating with others will improve Outcome: Progressing   Problem: Education: Goal: Knowledge of the prescribed therapeutic regimen will improve Outcome: Progressing

## 2018-06-14 NOTE — Progress Notes (Signed)
[facility] MD Progress Note  06/14/2018 3:29 PM Bruce [name]  MRN:  [id number] Subjective:  Pt states that he still hears voices but "they are much quieter." He still feels suicidal but states, "It will take a long time for those to go away." He states that he has had suicidal thoughts for a long time. He states that he still wants residential treatment at [facility]. He states that he will stay with a friend in [place] until that intake appointment on Tuesday. He is organized in thoughts. He is eating well. He is sleeping well. HE feels Haldol is helping all.   Principal Problem: Schizoaffective disorder, depressive type (HCC) Diagnosis:   Patient Active Problem List   Diagnosis Date Noted  . Schizoaffective disorder, depressive type (HCC) [F25.1] 08/04/2017    Priority: High  . Cocaine use disorder, severe, dependence (HCC) [F14.20] 01/05/2017    Priority: High  . Malingering [Z76.5] 06/12/2018  . Tobacco use disorder [F17.200] 06/09/2018  . HTN (hypertension) [I10] 06/09/2018  . 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 Polysubstance abuse (HCC) [F19.10] 02/27/2017  . Substance induced mood disorder (HCC) [F19.94] 02/27/2017  . Cocaine-induced mood disorder (HCC) [F14.94] 01/05/2017  . Cocaine use disorder, mild, abuse (HCC) [F14.10] 05/03/2016  . Cannabis use disorder, mild, abuse [F12.10] 05/03/2016  . Suicidal ideations [R45.851] 04/30/2016  . 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 Alcohol use disorder, moderate, dependence (HCC) [F10.20] 09/22/2015  . Morbid obesity (HCC) [E66.01] 09/22/2015   Total Time spent with patient: 20 minutes  Past Psychiatric History: See H&P  Past Medical History:  Past Medical History:  Diagnosis Date  . Depression   . Obesity   . Schizophrenia (HCC)    History reviewed. No pertinent surgical history. Family History:  Family History  Problem Relation Age of Onset  . Schizophrenia Mother    Family Psychiatric   History: See h&P Social History:  Social History   Substance and Sexual Activity  Alcohol Use No     Social History   Substance and Sexual Activity  Drug Use Yes  . Types: Marijuana, "Crack" cocaine, Cocaine   Comment: "in the past"    Social History   Socioeconomic History  . Marital status: Single    Spouse name: Not on file  . Number of children: Not on file  . Years of education: Not on file  . Highest education level: Not on file  Occupational History  . Not on file  Social Needs  . Financial resource strain: Not on file  . Food insecurity:    Worry: Not on file    Inability: Not on file  . Transportation needs:    Medical: Not on file    Non-medical: Not on file  Tobacco Use  . Smoking status: Current Every Day Smoker    Packs/day: 0.50    Types: Cigarettes  . Smokeless tobacco: Never Used  Substance and Sexual Activity  . Alcohol use: No  . Drug use: Yes    Types: Marijuana, "Crack" cocaine, Cocaine    Comment: "in the past"  . Sexual activity: Not Currently  Lifestyle  . Physical activity:    Days per week: Not on file    Minutes per session: Not on file  . Stress: Not on file  Relationships  . Social connections:    Talks on phone: Not on file    Gets together: Not on file    Attends religious service: Not on file    Active member of  club or organization: Not on file    Attends meetings of clubs or organizations: Not on file    Relationship status: Not on file  Other Topics Concern  . Not on file  Social History Narrative  . Not on file   Additional Social History:                         Sleep: Fair  Appetite:  Good  Current Medications: Current Facility-Administered Medications  Medication Dose Route Frequency Provider Last Rate Last Dose  . acetaminophen (TYLENOL) tablet 650 mg  650 mg Oral Q6H PRN Pucilowska, Jolanta B, MD   650 mg at 06/13/18 2229  . alum & mag hydroxide-simeth (MAALOX/MYLANTA) 200-200-20 MG/5ML  suspension 30 mL  30 mL Oral Q4H PRN Pucilowska, Jolanta B, MD      . amLODipine (NORVASC) tablet 5 mg  5 mg Oral Daily Pucilowska, Jolanta B, MD   5 mg at 06/14/18 1000  . benztropine (COGENTIN) tablet 1 mg  1 mg Oral QHS Taneisha Fuson R, MD   1 mg at 06/13/18 2229  . haloperidol (HALDOL) tablet 10 mg  10 mg Oral QHS Pucilowska, Jolanta B, MD   10 mg at 06/13/18 2229  . magnesium hydroxide (MILK OF MAGNESIA) suspension 30 mL  30 mL Oral Daily PRN Pucilowska, Jolanta B, MD      . nicotine (NICODERM CQ - dosed in mg/24 hours) patch 21 mg  21 mg Transdermal Q0600 Pucilowska, Jolanta B, MD   21 mg at 06/10/18 0646  . traZODone (DESYREL) tablet 50 mg  50 mg Oral QHS PRN Beverly Sessions, MD   50 mg at 06/12/18 2127  . traZODone (DESYREL) tablet 50 mg  50 mg Oral QHS Beverly Sessions, MD   50 mg at 06/13/18 2229    Lab Results: No results found for this or any previous visit (from the past 48 hour(s)).  Blood Alcohol level:  Lab Results  Component Value Date   ETH <10 06/07/2018   ETH <10 11/07/2017    Metabolic Disorder Labs: Lab Results  Component Value Date   HGBA1C 5.2 10/12/2016   MPG 103 10/12/2016   MPG 108 09/26/2015   Lab Results  Component Value Date   PROLACTIN 63.9 (H) 10/12/2016   PROLACTIN 19.9 (H) 05/06/2016   Lab Results  Component Value Date   CHOL 148 08/05/2017   TRIG 73 08/05/2017   HDL 40 (L) 08/05/2017   CHOLHDL 3.7 08/05/2017   VLDL 15 08/05/2017   LDLCALC 93 08/05/2017   LDLCALC 88 10/12/2016    Physical Findings: AIMS: Facial and Oral Movements Muscles of Facial Expression: None, normal Lips and Perioral Area: None, normal Jaw: None, normal Tongue: None, normal,Extremity Movements Upper (arms, wrists, hands, fingers): None, normal Lower (legs, knees, ankles, toes): None, normal, Trunk Movements Neck, shoulders, hips: None, normal, Overall Severity Severity of abnormal movements (highest score from questions above): None, normal Incapacitation  due to abnormal movements: None, normal Patient's awareness of abnormal movements (rate only patient's report): No Awareness, Dental Status Current problems with teeth and/or dentures?: No Does patient usually wear dentures?: No  CIWA:  CIWA-Ar Total: 2 COWS:     Musculoskeletal: Strength & Muscle Tone: within normal limits Gait & Station: normal Patient leans: N/A  Psychiatric Specialty Exam: Physical Exam  Nursing note and vitals reviewed.   Review of Systems  All other systems reviewed and are negative.   Blood pressure 127/63, pulse 66, temperature  97.8 F (36.6 C), temperature source Oral, resp. rate 18, height 5\' 5"  (1.651 m), weight (!) 164.2 kg, SpO2 100 %.Body mass index is 60.24 kg/m.  General Appearance: Casual  Eye Contact:  Fair  Speech:  Clear and Coherent  Volume:  Normal  Mood:  Euthymic  Affect:  Appropriate  Thought Process:  Coherent and Goal Directed  Orientation:  Full (Time, Place, and Person)  Thought Content:  Logical  Suicidal Thoughts:  Yes.  without intent/plan  Homicidal Thoughts:  No  Memory:  Immediate;   Fair  Judgement:  Impaired  Insight:  Lacking  Psychomotor Activity:  Normal  Concentration:  Concentration: Fair  Recall:  [employer] of Knowledge:  Fair  Language:  Fair  Akathisia:  No      Assets:  Resilience  ADL's:  Intact  Cognition:  WNL  Sleep:  Number of Hours: 6.5     Treatment Plan Summary:  42 yo male admitted due to SI and AH commanding him to kill himself. Voices are improving and much quieter although they still tell him to harm himself. He reports passive SI.   Plan:  psychosis -Improving -Continue Haldol 10 mg qhs -Continue Cogentin 1 mg qhs  Cocaine use disorder -He will go to intake appointment at [facility] -he will likely discharge by Friday He wants to stay with a friend in [place]    Haskell Riling, MD 06/14/2018, 3:29 PM

## 2018-06-14 NOTE — [facility] Group Notes (Signed)
  Emotional Regulation October 23 1PM  Type of Therapy/Topic:  Group Therapy:  Emotion Regulation  Participation Level: Did not attend resting in his room  Description of Group:   The purpose of this group is to assist patients in learning to regulate negative emotions and experience positive emotions. Patients will be guided to discuss ways in which they have been vulnerable to their negative emotions. These vulnerabilities will be juxtaposed with experiences of positive emotions or situations, and patients will be challenged to use positive emotions to combat negative ones. Special emphasis will be placed on coping with negative emotions in conflict situations, and patients will process healthy conflict resolution skills.  Therapeutic Goals: 1. Patient will identify two positive emotions or experiences to reflect on in order to balance out negative emotions 2. Patient will label two or more emotions that they find the most difficult to experience 3. Patient will demonstrate positive conflict resolution skills through discussion and/or role plays  Summary of Patient Progress:       Therapeutic Modalities:   Cognitive Behavioral Therapy Feelings Identification Dialectical Behavioral Therapy   Jacqualine Weichel LCSW 2:15 pm

## 2018-06-14 NOTE — Progress Notes (Signed)
Patient alert and oriented x 4, denies SI/HI but endorses AH but denies command hallucination. Patient's affect is flat but brightens upon approach, patient makes good eye contact, thoughts are more organized and coherent, complaint with medication interacting appropriately with peers and staff, 15 minutes safety checks maintained .

## 2018-06-14 NOTE — Progress Notes (Signed)
Recreation Therapy Notes  Date: 06/14/2018  Time: 9:30 am   Location: Craft room   Behavioral response: N/A   Intervention Topic: Communication  Discussion/Intervention: Patient did not attend group.   Clinical Observations/Feedback:  Patient did not attend group.   Lorely Bubb LRT/CTRS        Caspian Deleonardis 06/14/2018 12:06 PM

## 2018-06-14 NOTE — Progress Notes (Addendum)
Received Bruce [name] this AM in his room after breakfast, he was compliant with his medications and agreed to take his Norvasc.  The medication was explained to him related to compliance  to maintain a normal blood pressure. He was OOB in the milieu for briefs periods of time. He continues to endorse hearing voices, depression with SI without a plan. He rated depression, anxiety, and hopelessness 6/10 on his Self Inventory sheet. The self Inventory sheet was checked, no, for suicidal thoughts.  He feels safe here on the unit.

## 2018-06-15 MED ORDER — AMLODIPINE BESYLATE 5 MG PO TABS: 5.0000 mg | ORAL_TABLET | Freq: Every day | ORAL | 0 refills | Status: DC

## 2018-06-15 MED ORDER — AMLODIPINE BESYLATE 5 MG PO TABS: 5.0000 mg | ORAL_TABLET | Freq: Every day | ORAL | 1 refills | Status: DC

## 2018-06-15 MED ORDER — TRAZODONE HCL 50 MG PO TABS: 50.0000 mg | ORAL_TABLET | Freq: Every day | ORAL | 0 refills | Status: DC

## 2018-06-15 MED ORDER — BENZTROPINE MESYLATE 1 MG PO TABS: 1.0000 mg | ORAL_TABLET | Freq: Every day | ORAL | 1 refills | Status: DC

## 2018-06-15 MED ORDER — HALOPERIDOL 10 MG PO TABS: 10.0000 mg | ORAL_TABLET | Freq: Every day | ORAL | 1 refills | Status: DC

## 2018-06-15 MED ORDER — BENZTROPINE MESYLATE 1 MG PO TABS: 1.0000 mg | ORAL_TABLET | Freq: Every day | ORAL | 0 refills | Status: DC

## 2018-06-15 MED ORDER — TRAZODONE HCL 50 MG PO TABS: 50.0000 mg | ORAL_TABLET | Freq: Every day | ORAL | 1 refills | Status: DC

## 2018-06-15 MED ORDER — HALOPERIDOL 10 MG PO TABS: 10.0000 mg | ORAL_TABLET | Freq: Every day | ORAL | 0 refills | Status: DC

## 2018-06-15 NOTE — Progress Notes (Signed)
Recreation Therapy Notes  Date: 06/15/2018  Time: 9:30 am   Location: Craft room   Behavioral response: N/A   Intervention Topic: Problem Solving  Discussion/Intervention: Patient did not attend group.   Clinical Observations/Feedback:  Patient did not attend group.   Anelia Carriveau LRT/CTRS        Nakisha Chai 06/15/2018 11:07 AM

## 2018-06-15 NOTE — Progress Notes (Signed)
Patient alert and oriented x 4, denies SI/HI/AVH,  affect is flat but he brightens upon approach, patient isolated to self in the room, minimal interaction with peers and staff, did not attend evening wrap up group, compliant with medication and was making good eye contact with Clinical research associate. 15 minute safety checks maintained will continue to monitor.

## 2018-06-15 NOTE — [facility] Group Notes (Signed)
[facility] Group Notes:  (Nursing/MHT/Case Management/Adjunct)  Date:  06/15/2018  Time:  5:25 AM  Type of Therapy:  Group Therapy  Participation Level:  Did Not Attend   Lelan Pons 06/15/2018, 5:25 AM

## 2018-06-15 NOTE — [facility] Group Notes (Signed)
LCSW Group Therapy Note  06/15/2018 1:00 pm  Type of Therapy/Topic:  Group Therapy:  Balance in Life  Participation Level:  Did Not Attend  Description of Group:    This group will address the concept of balance and how it feels and looks when one is unbalanced. Patients will be encouraged to process areas in their lives that are out of balance and identify reasons for remaining unbalanced. Facilitators will guide patients in utilizing problem-solving interventions to address and correct the stressor making their life unbalanced. Understanding and applying boundaries will be explored and addressed for obtaining and maintaining a balanced life. Patients will be encouraged to explore ways to assertively make their unbalanced needs known to significant others in their lives, using other group members and facilitator for support and feedback.  Therapeutic Goals: 1. Patient will identify two or more emotions or situations they have that consume much of in their lives. 2. Patient will identify signs/triggers that life has become out of balance:  3. Patient will identify two ways to set boundaries in order to achieve balance in their lives:  4. Patient will demonstrate ability to communicate their needs through discussion and/or role plays  Summary of Patient Progress:      Therapeutic Modalities:   Cognitive Behavioral Therapy Solution-Focused Therapy Assertiveness Training  Alease Frame, LCSW 06/15/2018 4:07 PM

## 2018-06-15 NOTE — Plan of Care (Signed)
Patient stated that the voices are better now.Verbalized passive suicidal thoughts at times.Stated that he just don't feel like getting out of the bed.Did not attend groups.Compliant with medications.Patient stated that he is ready for discharge and he is going to spend time for himself.Support and encouragement given.

## 2018-06-15 NOTE — [facility] Group Notes (Signed)
[facility] Group Notes:  (Nursing/MHT/Case Management/Adjunct)  Date:  06/15/2018  Time:  10:24 PM  Type of Therapy:  Group Therapy  Participation Level:  Did Not Attend    Jinger Neighbors 06/15/2018, 10:24 PM

## 2018-06-15 NOTE — Discharge Summary (Addendum)
Physician Discharge Summary Note  Patient:  Bruce [name] is an 42 y.o., male MRN:  [id number] DOB:  May 16, 1976 Patient phone:  [phone] (home)  Patient address:   [address] [place] [address],  Total Time spent with patient: 15 minutes  Plus 20 minutes of medication reconciliation, discharge planning, and discharge documentation   Date of Admission:  06/09/2018 Date of Discharge: 06/16/18  Reason for Admission:  I am hearing voices to hurt them and myself" Pt is 33 y/ o M with h/o schizoaffective d/o, referred by [facility]. Per report, pt worsening psychosis, he feels like someone spiked his alcohol which made his auditory hallucinations worse,  hearing voices telling him to kill others and kill himself. pt reportedly voiced"I got 2 9 mm in my nightstand drawer; When I get out of here I going to kill them two that spiked my drink; cause everybody know what problem I have with cocaine; then I'm going to kill myself and a place in hell will be waiting on me. I already feel like the devil in the room with me; I feeling all hot and sweating and stuff."  Pt admits hearing voices and feeling suicidal for about 2 weeks, states he stopped taking pill haldol about 2 weeks ago, per report he stooped haldol dec about 2 months ago, pt states he got haldol dec shot early this month. Pt denies suicidal intent, contract for safety, denies HI. Pt guarded, suspicious, paranoid.  UDS positive for cocaine and THC. pt states using cocaine and THC -he has been using only occ for last one month, admits using more regularly before 1 month. states he lives by himself. Pt wanting rehab upon d/c.   Principal Problem: Schizoaffective disorder, depressive type [facility]) Discharge Diagnoses: Patient Active Problem List   Diagnosis Date Noted  . Schizoaffective disorder, depressive type (HCC) [F25.1] 08/04/2017    Priority: High  . Cocaine use disorder, severe, dependence (HCC) [F14.20] 01/05/2017     Priority: High  . Malingering [Z76.5] 06/12/2018  . Tobacco use disorder [F17.200] 06/09/2018  . HTN (hypertension) [I10] 06/09/2018  . 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 Polysubstance abuse (HCC) [F19.10] 02/27/2017  . Substance induced mood disorder (HCC) [F19.94] 02/27/2017  . Cocaine-induced mood disorder (HCC) [F14.94] 01/05/2017  . Cocaine use disorder, mild, abuse (HCC) [F14.10] 05/03/2016  . Cannabis use disorder, mild, abuse [F12.10] 05/03/2016  . Suicidal ideations [R45.851] 04/30/2016  . 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 Alcohol use disorder, moderate, dependence (HCC) [F10.20] 09/22/2015  . Morbid obesity (HCC) [E66.01] 09/22/2015    Past Psychiatric History: See h&P  Past Medical History:  Past Medical History:  Diagnosis Date  . Depression   . Obesity   . Schizophrenia (HCC)    History reviewed. No pertinent surgical history. Family History:  Family History  Problem Relation Age of Onset  . Schizophrenia Mother    Family Psychiatric  History: See H&P Social History:  Social History   Substance and Sexual Activity  Alcohol Use No     Social History   Substance and Sexual Activity  Drug Use Yes  . Types: Marijuana, "Crack" cocaine, Cocaine   Comment: "in the past"    Social History   Socioeconomic History  . Marital status: Single    Spouse name: Not on file  . Number of children: Not on file  . Years of education: Not on file  . Highest education level: Not on file  Occupational History  . Not on  file  Social Needs  . Financial resource strain: Not on file  . Food insecurity:    Worry: Not on file    Inability: Not on file  . Transportation needs:    Medical: Not on file    Non-medical: Not on file  Tobacco Use  . Smoking status: Current Every Day Smoker    Packs/day: 0.50    Types: Cigarettes  . Smokeless tobacco: Never Used  Substance and Sexual Activity  . Alcohol use: No  . Drug use: Yes     Types: Marijuana, "Crack" cocaine, Cocaine    Comment: "in the past"  . Sexual activity: Not Currently  Lifestyle  . Physical activity:    Days per week: Not on file    Minutes per session: Not on file  . Stress: Not on file  Relationships  . Social connections:    Talks on phone: Not on file    Gets together: Not on file    Attends religious service: Not on file    Active member of club or organization: Not on file    Attends meetings of clubs or organizations: Not on file    Relationship status: Not on file  Other Topics Concern  . Not on file  Social History Narrative  . Not on file    Hospital Course:  Pt was started on Haldol and cogentin for AH. It is in my opinion that there is a strong component of malingering due to homelessness and lack of disability money for the month. He continuously voiced AH but was very vague about them and stated "Telling me to kil myself." However, he was not in any distress about these voices at all. He has full range affect, he had no evidence of responding to internal stimuli, no delay in responses despite hearing loud voices,a nd was fully organized in his thoughts. Per staff who knows him, he typically voices these symptoms when he is out of money for the month and then spontaneously states that he is better. HE was very calm on the unit with no behavioral issues. He did not attend many groups. He reported improvement of all symptosm with Haldol. HE expressed interest in a residential substance program but declined many options put forth to him. CSW did get him an intake appointment at [facility] for Tuesday which he plans to attend.  On day of discharge, pt had bright affect. He states that he feels ready to discharge, He reports chronic passive SI with no plan or intent. He states that the voices are much better. They are still there but "they are much quieter." Denies command hallucinations. Denies HI. He plans to stay with a friend in [place] and  then go to [facility] on Tuesday for his intake. He feels well rested. He was given 7 day supply of medications.   The patient is at low risk of imminent suicide. Patient denied thoughts, intent, or plan for harm to self or others, expressed significant future orientation, and expressed an ability to mobilize assistance for his needs. he is presently void of any contributing psychiatric symptoms, cognitive difficulties, or substance use which would elevate his risk for lethality. Chronic risk for lethality is elevated in light of substance abuse, noncompliance with medications and follow up. The chronic risk is presently mitigated by his ongoing desire and engagement in [facility] treatment and mobilization of support from family and friends. Chronic risk may elevate if he experiences any significant loss or worsening of symptoms, which can  be managed and monitored through outpatient providers. At this time,a cute risk for lethality is low and he is stable for ongoing outpatient management.   Modifiable risk factors were addressed during this hospitalization through appropriate pharmacotherapy and establishment of outpatient follow-up treatment. Some risk factors for suicide are situational (i.e. Unstable housing) or related personality pathology (i.e. Poor coping mechanisms) and thus cannot be further mitigated by continued hospitalization in this setting.    Physical Findings: AIMS: Facial and Oral Movements Muscles of Facial Expression: None, normal Lips and Perioral Area: None, normal Jaw: None, normal Tongue: None, normal,Extremity Movements Upper (arms, wrists, hands, fingers): None, normal Lower (legs, knees, ankles, toes): None, normal, Trunk Movements Neck, shoulders, hips: None, normal, Overall Severity Severity of abnormal movements (highest score from questions above): None, normal Incapacitation due to abnormal movements: None, normal Patient's awareness of abnormal movements (rate only patient's  report): No Awareness, Dental Status Current problems with teeth and/or dentures?: No Does patient usually wear dentures?: No  CIWA:  CIWA-Ar Total: 2 COWS:     Musculoskeletal: Strength & Muscle Tone: within normal limits Gait & Station: normal Patient leans: N/A  Psychiatric Specialty Exam: Physical Exam  Nursing note and vitals reviewed.   Review of Systems  All other systems reviewed and are negative.   Blood pressure 110/71, pulse 62, temperature 98.4 F (36.9 C), temperature source Oral, resp. rate 18, height 5\' 5"  (1.651 m), weight (!) 164.2 kg, SpO2 100 %.Body mass index is 60.24 kg/m.  General Appearance: Casual  Eye Contact:  Good  Speech:  Clear and Coherent  Volume:  Normal  Mood:  Euthymic  Affect:  Appropriate  Thought Process:  Coherent  Orientation:  Other:  Full  Thought Content:  Logical  Suicidal Thoughts:  Yes with no plan or intent, chronic passive thoughts  Homicidal Thoughts:  No  Memory:  Immediate;   Fair  Judgement:  Fair  Insight:  Good  Psychomotor Activity:  Normal  Concentration:  Concentration: Fair  Recall:  Fair  Fund of Knowledge:  Fair  Language:  Fair  Akathisia:  No      Assets:  Resilience  ADL's:  Intact  Cognition:  WNL  Sleep:  Number of Hours: 8     Have you used any form of tobacco in the last 30 days? (Cigarettes, Smokeless Tobacco, Cigars, and/or Pipes): Yes  Has this patient used any form of tobacco in the last 30 days? (Cigarettes, Smokeless Tobacco, Cigars, and/or Pipes) Yes, Yes, A prescription for an FDA-approved tobacco cessation medication was offered at discharge and the patient refused  Blood Alcohol level:  Lab Results  Component Value Date   [facility] <10 06/07/2018   ETH <10 11/07/2017    Metabolic Disorder Labs:  Lab Results  Component Value Date   HGBA1C 5.2 10/12/2016   MPG 103 10/12/2016   MPG 108 09/26/2015   Lab Results  Component Value Date   PROLACTIN 63.9 (H) 10/12/2016   PROLACTIN 19.9  (H) 05/06/2016   Lab Results  Component Value Date   CHOL 148 08/05/2017   TRIG 73 08/05/2017   HDL 40 (L) 08/05/2017   CHOLHDL 3.7 08/05/2017   VLDL 15 08/05/2017   LDLCALC 93 08/05/2017   LDLCALC 88 10/12/2016    See Psychiatric Specialty Exam and Suicide Risk Assessment completed by Attending Physician prior to discharge.  Discharge destination:  Other:  [facility] [place]  Is patient on multiple antipsychotic therapies at discharge:  No   Has  Patient had three or more failed trials of antipsychotic monotherapy by history:  No  Recommended Plan for Multiple Antipsychotic Therapies: NA   Allergies as of 06/15/2018   No Known Allergies     Medication List    STOP taking these medications   hydrOXYzine 50 MG tablet Commonly known as:  ATARAX/VISTARIL   ibuprofen 200 MG tablet Commonly known as:  ADVIL,MOTRIN   paliperidone 6 MG 24 hr tablet Commonly known as:  INVEGA     TAKE these medications     Indication  amLODipine 5 MG tablet Commonly known as:  NORVASC Take 1 tablet (5 mg total) by mouth daily. For high blood pressure  Indication:  High Blood Pressure Disorder   benztropine 1 MG tablet Commonly known as:  COGENTIN Take 1 tablet (1 mg total) by mouth at bedtime. For prevention of drug induced trmors What changed:    medication strength  how much to take  when to take this  Indication:  Extrapyramidal Reaction caused by Medications   haloperidol 10 MG tablet Commonly known as:  HALDOL Take 1 tablet (10 mg total) by mouth at bedtime.  Indication:  Psychosis   traZODone 50 MG tablet Commonly known as:  DESYREL Take 1 tablet (50 mg total) by mouth at bedtime.  Indication:  Trouble Sleeping      Follow-up Information    Services, [facility]. Go on 06/20/2018.   Why:   You are scheduled for intake on Tuesday June 20, 2018 at 7:45am. Please be there at that exact time or before. You will need to bring your ID, social security  card, 30 day supply of medications and refill and 2-weeks of clothing. Thank you. Contact information: [address] [place] [address] [phone]            Signed: Haskell Riling, MD 06/15/2018, 12:30 PM

## 2018-06-15 NOTE — Progress Notes (Signed)
[facility] MD Progress Note  06/15/2018 11:10 AM Bruce [name]  MRN:  [id number] Subjective:  Pt states that he is feeling better. He states that the voices are very quiet and is not able to make out what they say anymore. He reports passive SI but no plan or intent. He is sleeping fine. He is still motivated to go to treatment and plans to attend his [facility] Appointment next week. He states hat he has a friend in [place] that he plans to stay with until then. He is eating well. He is tolerating medications well. He has bright affect. He is organized in his thoughts.   Principal Problem: Schizoaffective disorder, depressive type (HCC) Diagnosis:   Patient Active Problem List   Diagnosis Date Noted  . Schizoaffective disorder, depressive type (HCC) [F25.1] 08/04/2017    Priority: High  . Cocaine use disorder, severe, dependence (HCC) [F14.20] 01/05/2017    Priority: High  . Malingering [Z76.5] 06/12/2018  . Tobacco use disorder [F17.200] 06/09/2018  . HTN (hypertension) [I10] 06/09/2018  . 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 Polysubstance abuse (HCC) [F19.10] 02/27/2017  . Substance induced mood disorder (HCC) [F19.94] 02/27/2017  . Cocaine-induced mood disorder (HCC) [F14.94] 01/05/2017  . Cocaine use disorder, mild, abuse (HCC) [F14.10] 05/03/2016  . Cannabis use disorder, mild, abuse [F12.10] 05/03/2016  . Suicidal ideations [R45.851] 04/30/2016  . 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 Alcohol use disorder, moderate, dependence (HCC) [F10.20] 09/22/2015  . Morbid obesity (HCC) [E66.01] 09/22/2015   Total Time spent with patient: 20 minutes  Past Psychiatric History: See H&P  Past Medical History:  Past Medical History:  Diagnosis Date  . Depression   . Obesity   . Schizophrenia (HCC)    History reviewed. No pertinent surgical history. Family History:  Family History  Problem Relation Age of Onset  . Schizophrenia Mother    Family  Psychiatric  History: See H&P Social History:  Social History   Substance and Sexual Activity  Alcohol Use No     Social History   Substance and Sexual Activity  Drug Use Yes  . Types: Marijuana, "Crack" cocaine, Cocaine   Comment: "in the past"    Social History   Socioeconomic History  . Marital status: Single    Spouse name: Not on file  . Number of children: Not on file  . Years of education: Not on file  . Highest education level: Not on file  Occupational History  . Not on file  Social Needs  . Financial resource strain: Not on file  . Food insecurity:    Worry: Not on file    Inability: Not on file  . Transportation needs:    Medical: Not on file    Non-medical: Not on file  Tobacco Use  . Smoking status: Current Every Day Smoker    Packs/day: 0.50    Types: Cigarettes  . Smokeless tobacco: Never Used  Substance and Sexual Activity  . Alcohol use: No  . Drug use: Yes    Types: Marijuana, "Crack" cocaine, Cocaine    Comment: "in the past"  . Sexual activity: Not Currently  Lifestyle  . Physical activity:    Days per week: Not on file    Minutes per session: Not on file  . Stress: Not on file  Relationships  . Social connections:    Talks on phone: Not on file    Gets together: Not on file    Attends religious service: Not on file  Active member of club or organization: Not on file    Attends meetings of clubs or organizations: Not on file    Relationship status: Not on file  Other Topics Concern  . Not on file  Social History Narrative  . Not on file   Additional Social History:                         Sleep: Good  Appetite:  Good  Current Medications: Current Facility-Administered Medications  Medication Dose Route Frequency Provider Last Rate Last Dose  . acetaminophen (TYLENOL) tablet 650 mg  650 mg Oral Q6H PRN Pucilowska, Jolanta B, MD   650 mg at 06/13/18 2229  . alum & mag hydroxide-simeth (MAALOX/MYLANTA) 200-200-20  MG/5ML suspension 30 mL  30 mL Oral Q4H PRN Pucilowska, Jolanta B, MD      . amLODipine (NORVASC) tablet 5 mg  5 mg Oral Daily Pucilowska, Jolanta B, MD   5 mg at 06/15/18 0848  . benztropine (COGENTIN) tablet 1 mg  1 mg Oral QHS [name] R, MD   1 mg at 06/14/18 2155  . haloperidol (HALDOL) tablet 10 mg  10 mg Oral QHS Pucilowska, Jolanta B, MD   10 mg at 06/14/18 2155  . magnesium hydroxide (MILK OF MAGNESIA) suspension 30 mL  30 mL Oral Daily PRN Pucilowska, Jolanta B, MD      . nicotine (NICODERM CQ - dosed in mg/24 hours) patch 21 mg  21 mg Transdermal Q0600 Pucilowska, Jolanta B, MD   21 mg at 06/10/18 0646  . traZODone (DESYREL) tablet 50 mg  50 mg Oral QHS PRN Beverly Sessions, MD   50 mg at 06/12/18 2127  . traZODone (DESYREL) tablet 50 mg  50 mg Oral QHS Beverly Sessions, MD   50 mg at 06/14/18 2155    Lab Results: No results found for this or any previous visit (from the past 48 hour(s)).  Blood Alcohol level:  Lab Results  Component Value Date   ETH <10 06/07/2018   ETH <10 11/07/2017    Metabolic Disorder Labs: Lab Results  Component Value Date   HGBA1C 5.2 10/12/2016   MPG 103 10/12/2016   MPG 108 09/26/2015   Lab Results  Component Value Date   PROLACTIN 63.9 (H) 10/12/2016   PROLACTIN 19.9 (H) 05/06/2016   Lab Results  Component Value Date   CHOL 148 08/05/2017   TRIG 73 08/05/2017   HDL 40 (L) 08/05/2017   CHOLHDL 3.7 08/05/2017   VLDL 15 08/05/2017   LDLCALC 93 08/05/2017   LDLCALC 88 10/12/2016    Physical Findings: AIMS: Facial and Oral Movements Muscles of Facial Expression: None, normal Lips and Perioral Area: None, normal Jaw: None, normal Tongue: None, normal,Extremity Movements Upper (arms, wrists, hands, fingers): None, normal Lower (legs, knees, ankles, toes): None, normal, Trunk Movements Neck, shoulders, hips: None, normal, Overall Severity Severity of abnormal movements (highest score from questions above): None,  normal Incapacitation due to abnormal movements: None, normal Patient's awareness of abnormal movements (rate only patient's report): No Awareness, Dental Status Current problems with teeth and/or dentures?: No Does patient usually wear dentures?: No  CIWA:  CIWA-Ar Total: 2 COWS:     Musculoskeletal: Strength & Muscle Tone: within normal limits Gait & Station: normal Patient leans: N/A  Psychiatric Specialty Exam: Physical Exam  Nursing note and vitals reviewed.   Review of Systems  All other systems reviewed and are negative.   Blood pressure 110/71,  pulse 62, temperature 98.4 F (36.9 C), temperature source Oral, resp. rate 18, height 5\' 5"  (1.651 m), weight (!) 164.2 kg, SpO2 100 %.Body mass index is 60.24 kg/m.  General Appearance: Casual  Eye Contact:  Good  Speech:  Clear and Coherent  Volume:  Normal  Mood:  Euthymic  Affect:  Appropriate  Thought Process:  Coherent and Goal Directed  Orientation:  Full (Time, Place, and Person)  Thought Content:  Logical  Suicidal Thoughts:  No  Homicidal Thoughts:  No  Memory:  Immediate;   Fair  Judgement:  Fair  Insight:  Fair  Psychomotor Activity:  Normal  Concentration:  Concentration: Fair  Recall:  [employer] of Knowledge:  Fair  Language:  Fair  Akathisia:  No      Assets:  Resilience  ADL's:  Intact  Cognition:  WNL  Sleep:  Number of Hours: 8     Treatment Plan Summary: 42 yo male admitted due to AH, SI, HI. Voices are improving and SI are now passive with no plan or intent. He has full affect. He is organized in thoughts. Plan will be to discharge tomorrow.  Plan:  Psychosis -Improving -Continue Haldol 10 mg daily -Continue cogentin 1 mg qhs  Cocaine Use disorder -He has intake at [facility] on Tuesday  Dispo -Discharge tomorrow> he will stay with friend in [facility], MD 06/15/2018, 11:10 AM

## 2018-06-15 NOTE — Plan of Care (Signed)
  Problem: Education: Goal: Ability to make informed decisions regarding treatment will improve Outcome: Progressing   Problem: Self-Concept: Goal: Ability to disclose and discuss suicidal ideas will improve Outcome: Progressing Goal: Will verbalize positive feelings about self Outcome: Progressing   Problem: Education: Goal: Knowledge of disease or condition will improve Outcome: Progressing Goal: Understanding of discharge needs will improve Outcome: Progressing   Problem: Health Behavior/Discharge Planning: Goal: Ability to identify changes in lifestyle to reduce recurrence of condition will improve Outcome: Progressing Goal: Identification of resources available to assist in meeting health care needs will improve Outcome: Progressing   Problem: Safety: Goal: Ability to remain free from injury will improve Outcome: Progressing   Problem: Coping: Goal: Ability to identify and develop effective coping behavior will improve Outcome: Progressing Goal: Ability to interact with others will improve Outcome: Progressing Goal: Demonstration of participation in decision-making regarding own care will improve Outcome: Progressing Goal: Ability to use eye contact when communicating with others will improve Outcome: Progressing   Problem: Education: Goal: Utilization of techniques to improve thought processes will improve Outcome: Progressing Goal: Knowledge of the prescribed therapeutic regimen will improve Outcome: Progressing   Problem: Activity: Goal: Interest or engagement in leisure activities will improve Outcome: Progressing Goal: Imbalance in normal sleep/wake cycle will improve Outcome: Progressing

## 2018-06-16 NOTE — Progress Notes (Signed)
D: Pt A & O X 4. Denies SI, HI, AVH and pain at this time. Presents guarded with flat affect; brightens up on interactions. D/C home as ordered. Picked up by security with [employer] vehicle and taken to bus stop. A: D/C instructions reviewed with pt including prescriptions, medication samples and follow up appointment; compliance encouraged. All belongings given to pt at time of departure. Scheduled medications given with verbal education and effects monitored. Safety checks maintained without incident till time of d/c.  R: Pt receptive to care. Compliant with medications when offered. Denies adverse drug reactions when assessed. Verbalized understanding related to d/c instructions. Pt ambulatory with a steady gait. Appears to be in no physical distress at time of departure.

## 2018-06-16 NOTE — Progress Notes (Signed)
Recreation Therapy Notes  INPATIENT RECREATION TR PLAN  Patient Details Name: Bruce [name] MRN: [id number] DOB: 16-Aug-1976 Today's Date: 06/16/2018  Rec Therapy Plan Is patient appropriate for Therapeutic Recreation?: Yes Treatment times per week: at least 3 Estimated Length of Stay: 5-7 days TR Treatment/Interventions: Group participation (Comment)  Discharge Criteria Pt will be discharged from therapy if:: Discharged Treatment plan/goals/alternatives discussed and agreed upon by:: Patient/family  Discharge Summary Short term goals set: Patient will engage in interactions with peers and staff in pro-social manner at least 2x within 5 recreation therapy group sessions Short term goals met: Not met Reason goals not met: Patient did not attend any groups Therapeutic equipment acquired: N/A Reason patient discharged from therapy: Discharge from hospital Pt/family agrees with progress & goals achieved: Yes Date patient discharged from therapy: 06/16/18   Corrie Brannen 06/16/2018, 1:23 PM

## 2018-06-16 NOTE — [facility] Group Notes (Signed)

## 2018-06-16 NOTE — Progress Notes (Signed)
Nursing note 7p-7a  Pt observed interacting with peers on unit this shift. Displayed a flat affect and depressed/pleasant mood upon interaction with this Clinical research associate. Pt denies pain ,denies SI/HI, and also denies any visual hallucinations at this time. Pt endorses audio hallucinations that tell him to harm himself. Pt is able to verbally contract for safety with this RN. Goal: "to decrease my stress and calm down." Pt expressed that he was anxious regarding discharge.  Pt educated on falls and encouraged to wear nonskid socks when ambulating in the halls. Pt also encouraged to call for help if feeling weak or dizzy. Pt verbalized understanding of all education provided. Pt is now resting in bed with eyes closed, with no signs or symptoms of pain or distress noted. Pt continues to remain safe on the unit and is observed by rounding every 15 min. RN will continue to monitor.

## 2018-06-16 NOTE — BHH Suicide Risk Assessment (Signed)
[facility] Discharge Suicide Risk Assessment   Principal Problem: Schizoaffective disorder, depressive type [facility]) Discharge Diagnoses:  Patient Active Problem List   Diagnosis Date Noted  . Schizoaffective disorder, depressive type (HCC) [F25.1] 08/04/2017    Priority: High  . Cocaine use disorder, severe, dependence (HCC) [F14.20] 01/05/2017    Priority: High  . Malingering [Z76.5] 06/12/2018  . Tobacco use disorder [F17.200] 06/09/2018  . HTN (hypertension) [I10] 06/09/2018  . 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 Polysubstance abuse (HCC) [F19.10] 02/27/2017  . Substance induced mood disorder (HCC) [F19.94] 02/27/2017  . Cocaine-induced mood disorder (HCC) [F14.94] 01/05/2017  . Cocaine use disorder, mild, abuse (HCC) [F14.10] 05/03/2016  . Cannabis use disorder, mild, abuse [F12.10] 05/03/2016  . Suicidal ideations [R45.851] 04/30/2016  . 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 Alcohol use disorder, moderate, dependence (HCC) [F10.20] 09/22/2015  . Morbid obesity (HCC) [E66.01] 09/22/2015    Mental Status Per Nursing Assessment::   On Admission:  Suicidal ideation indicated by patient  Demographic Factors:  Male, Low socioeconomic status and Unemployed  Loss Factors: Decrease in vocational status and Financial problems/change in socioeconomic status  Historical Factors: Impulsivity  Risk Reduction Factors:   Positive coping skills or problem solving skills  Continued Clinical Symptoms:  Alcohol/Substance Abuse/Dependencies Previous Psychiatric Diagnoses and Treatments  Cognitive Features That Contribute To Risk:  None    Suicide Risk:  Minimal Acute Risk: No identifiable suicidal ideation.   Follow-up Information    [facility], [facility]. Go on 06/20/2018.   Why:   You are scheduled for intake on Tuesday June 20, 2018 at 7:45am. Please be there at that exact time or before. You will need to bring your ID, social security  card, 30 day supply of medications and refill and 2-weeks of clothing. Thank you. Contact information: [address] [place] [address] [phone]             Haskell Riling, MD 06/16/2018, 9:02 AM

## 2018-06-16 NOTE — Progress Notes (Signed)
Recreation Therapy Notes  Date: 06/16/2018  Time: 9:30 am   Location: Craft room   Behavioral response: N/A   Intervention Topic: Leisure  Discussion/Intervention: Patient did not attend group.   Clinical Observations/Feedback:  Patient did not attend group.   Ostin Mathey LRT/CTRS         [place] 06/16/2018 11:30 AM 

## 2018-06-16 NOTE — Progress Notes (Signed)
  [facility] Adult Case Management Discharge Plan :  Will you be returning to the same living situation after discharge:  No. At discharge, do you have transportation home?: Yes,  Bus Do you have the ability to pay for your medications: Yes,  Insurance  Release of information consent forms completed and in the chart;  [name] needed at discharge.  Patient to Follow up at: Follow-up Information    [facility]. Go on 06/20/2018.   Why:   You are scheduled for intake on Tuesday June 20, 2018 at 7:45am. Please be there at that exact time or before. You will need to bring your ID, social security card, 30 day supply of medications and refill and 2-weeks of clothing. Thank you. Contact information: [address] [place] [address] [phone]           Next level of care provider has access to [facility]:no  Safety Planning and Suicide Prevention discussed: Yes,  Completed with patient. Patient refused family contact  Have you used any form of tobacco in the last 30 days? (Cigarettes, Smokeless Tobacco, Cigars, and/or Pipes): Yes  Has patient been referred to the Quitline?: Patient refused referral  Patient has been referred for addiction treatment: Yes  Johny Shears, LCSW 06/16/2018, 9:22 AM

## 2018-06-22 ENCOUNTER — Emergency Department (HOSPITAL_COMMUNITY)
Admission: EM | Admit: 2018-06-22 | Discharge: 2018-06-23 | Disposition: A | Discharge status: Home / Self Care | Payer: Medicaid Other | Attending: Emergency Medicine | Admitting: Emergency Medicine

## 2018-06-22 ENCOUNTER — Encounter (HOSPITAL_COMMUNITY): Payer: Self-pay | Admitting: Emergency Medicine

## 2018-06-22 DIAGNOSIS — J069 Acute upper respiratory infection, unspecified: Secondary | ICD-10-CM | POA: Insufficient documentation

## 2018-06-22 DIAGNOSIS — F250 Schizoaffective disorder, bipolar type: Secondary | ICD-10-CM | POA: Insufficient documentation

## 2018-06-22 DIAGNOSIS — F1910 Other psychoactive substance abuse, uncomplicated: Secondary | ICD-10-CM

## 2018-06-22 DIAGNOSIS — F17210 Nicotine dependence, cigarettes, uncomplicated: Secondary | ICD-10-CM | POA: Diagnosis not present

## 2018-06-22 DIAGNOSIS — R45851 Suicidal ideations: Secondary | ICD-10-CM

## 2018-06-22 DIAGNOSIS — F1420 Cocaine dependence, uncomplicated: Secondary | ICD-10-CM | POA: Insufficient documentation

## 2018-06-22 DIAGNOSIS — F22 Delusional disorders: Secondary | ICD-10-CM

## 2018-06-22 DIAGNOSIS — Z79899 Other long term (current) drug therapy: Secondary | ICD-10-CM | POA: Diagnosis not present

## 2018-06-22 LAB — CBC
HEMATOCRIT: 43 % (ref 39.0–52.0)
HEMOGLOBIN: 13.3 g/dL (ref 13.0–17.0)
MCH: 28.4 pg (ref 26.0–34.0)
MCHC: 30.9 g/dL (ref 30.0–36.0)
MCV: 91.7 fL (ref 80.0–100.0)
NRBC: 0 % (ref 0.0–0.2)
Platelets: 263 10*3/uL (ref 150–400)
RBC: 4.69 MIL/uL (ref 4.22–5.81)
RDW: 15.2 % (ref 11.5–15.5)
WBC: 9.5 10*3/uL (ref 4.0–10.5)

## 2018-06-22 LAB — COMPREHENSIVE METABOLIC PANEL
ALBUMIN: 3.3 g/dL — AB (ref 3.5–5.0)
ALT: 25 U/L (ref 0–44)
AST: 21 U/L (ref 15–41)
Alkaline Phosphatase: 69 U/L (ref 38–126)
Anion gap: 9 (ref 5–15)
BILIRUBIN TOTAL: 0.6 mg/dL (ref 0.3–1.2)
BUN: 12 mg/dL (ref 6–20)
CHLORIDE: 107 mmol/L (ref 98–111)
CO2: 22 mmol/L (ref 22–32)
CREATININE: 1.26 mg/dL — AB (ref 0.61–1.24)
Calcium: 9.2 mg/dL (ref 8.9–10.3)
GFR calc Af Amer: >60 mL/min (ref >60)
GFR calc non Af Amer: >60 mL/min (ref >60)
GLUCOSE: 78 mg/dL (ref 70–99)
POTASSIUM: 4 mmol/L (ref 3.5–5.1)
Sodium: 138 mmol/L (ref 135–145)
Total Protein: 7.6 g/dL (ref 6.5–8.1)

## 2018-06-22 LAB — ETHANOL: Alcohol, Ethyl (B): <10 mg/dL (ref <10)

## 2018-06-22 LAB — SALICYLATE LEVEL: Salicylate Lvl: <7 mg/dL (ref 2.8–30.0)

## 2018-06-22 LAB — ACETAMINOPHEN LEVEL: Acetaminophen (Tylenol), Serum: <10 ug/mL — ABNORMAL LOW (ref 10–30)

## 2018-06-22 NOTE — ED Notes (Signed)
Pt's belongings inventoried and placed in locker 6, valuables with security, to be wanded by security. Pt using phone to call credit card company to report stolen card

## 2018-06-22 NOTE — ED Triage Notes (Signed)
Pt reports ongoing hallucinations since last visit, reports he "wants to purge and kill everyone before they kill me." Reports he has access to two 9mm guns.

## 2018-06-23 ENCOUNTER — Emergency Department (HOSPITAL_COMMUNITY): Payer: Medicaid Other

## 2018-06-23 LAB — RAPID URINE DRUG SCREEN, HOSP PERFORMED
Amphetamines: NOT DETECTED
BARBITURATES: NOT DETECTED
BENZODIAZEPINES: NOT DETECTED
COCAINE: POSITIVE — AB
OPIATES: NOT DETECTED
TETRAHYDROCANNABINOL: POSITIVE — AB

## 2018-06-23 MED ORDER — AMLODIPINE BESYLATE 5 MG PO TABS
5.0000 mg | ORAL_TABLET | Freq: Every day | ORAL | Status: DC
  Administered 2018-06-23: 5 mg via ORAL
  Filled 2018-06-23: qty 1

## 2018-06-23 MED ORDER — ZOLPIDEM TARTRATE 5 MG PO TABS
10.0000 mg | ORAL_TABLET | Freq: Every evening | ORAL | Status: DC | PRN
  Administered 2018-06-23: 10 mg via ORAL
  Filled 2018-06-23: qty 2

## 2018-06-23 MED ORDER — LORATADINE 10 MG PO TABS
10.0000 mg | ORAL_TABLET | Freq: Every day | ORAL | Status: DC | PRN
  Administered 2018-06-23: 10 mg via ORAL
  Filled 2018-06-23: qty 1

## 2018-06-23 MED ORDER — BENZONATATE 100 MG PO CAPS
100.0000 mg | ORAL_CAPSULE | Freq: Three times a day (TID) | ORAL | Status: DC | PRN
  Administered 2018-06-23 (×2): 100 mg via ORAL
  Filled 2018-06-23 (×2): qty 1

## 2018-06-23 MED ORDER — IBUPROFEN 400 MG PO TABS
600.0000 mg | ORAL_TABLET | Freq: Four times a day (QID) | ORAL | Status: DC | PRN
  Administered 2018-06-23 (×2): 600 mg via ORAL
  Filled 2018-06-23 (×2): qty 1

## 2018-06-23 MED ORDER — TRAZODONE HCL 50 MG PO TABS: 50.0000 mg | ORAL_TABLET | Freq: Every day | ORAL | Status: DC

## 2018-06-23 MED ORDER — HALOPERIDOL 5 MG PO TABS: 10.0000 mg | ORAL_TABLET | Freq: Every day | ORAL | Status: DC

## 2018-06-23 MED ORDER — HALOPERIDOL LACTATE 5 MG/ML IJ SOLN
5.0000 mg | Freq: Once | INTRAMUSCULAR | Status: AC
  Administered 2018-06-23: 5 mg via INTRAMUSCULAR
  Filled 2018-06-23: qty 1

## 2018-06-23 MED ORDER — BENZTROPINE MESYLATE 1 MG PO TABS: 1.0000 mg | ORAL_TABLET | Freq: Every day | ORAL | Status: DC

## 2018-06-23 NOTE — ED Provider Notes (Signed)
[facility] EMERGENCY DEPARTMENT Provider Note   CSN: [id number] Arrival date & time: 06/22/18  1927     History   Chief Complaint Chief Complaint  Patient presents with  . Hallucinations  . Suicidal    HPI Bruce [name] is a 42 y.o. male.   42 year old male with a history of polysubstance abuse, schizophrenia, malingering presents to the emergency department reporting ongoing hallucinations.  States that he has been taking the medication prescribed to him at discharge from [facility], but he does not feel it is working.  He states that he has been increasingly paranoid with desire to "kill everyone before they kill me".  States he has had feelings of "just wanting to die". Does report eating crack cocaine yesterday.       Past Medical History:  Diagnosis Date  . Depression   . Obesity   . Schizophrenia [facility])     Patient Active Problem List   Diagnosis Date Noted  . Malingering 06/12/2018  . Tobacco use disorder 06/09/2018  . HTN (hypertension) 06/09/2018  . Schizoaffective disorder, depressive type (HCC) 08/04/2017  . 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 Polysubstance abuse (HCC) 02/27/2017  . Substance induced mood disorder (HCC) 02/27/2017  . Cocaine use disorder, severe, dependence (HCC) 01/05/2017  . 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 Cocaine use disorder, mild, abuse (HCC) 05/03/2016  . Cannabis use disorder, mild, abuse 05/03/2016  . Suicidal ideations 04/30/2016  . Hyperprolactinemia (HCC) 09/24/2015  . Alcohol use disorder, moderate, dependence (HCC) 09/22/2015  . Morbid obesity (HCC) 09/22/2015    History reviewed. No pertinent surgical history.      Home Medications    Prior to Admission medications   Medication Sig Start Date End Date Taking? Authorizing Provider  amLODipine (NORVASC) 5 MG tablet Take 1 tablet (5 mg total) by mouth daily. For high blood pressure 06/15/18 06/15/19  McNew, Ileene Hutchinson, MD    benztropine (COGENTIN) 1 MG tablet Take 1 tablet (1 mg total) by mouth at bedtime. For prevention of drug induced trmors 06/15/18   McNew, Ileene Hutchinson, MD  haloperidol (HALDOL) 10 MG tablet Take 1 tablet (10 mg total) by mouth at bedtime. 06/15/18   McNew, Ileene Hutchinson, MD  traZODone (DESYREL) 50 MG tablet Take 1 tablet (50 mg total) by mouth at bedtime. 06/15/18   McNew, Ileene Hutchinson, MD    Family History Family History  Problem Relation Age of Onset  . Schizophrenia Mother     Social History Social History   Tobacco Use  . Smoking status: Current Every Day Smoker    Packs/day: 0.50    Types: Cigarettes  . Smokeless tobacco: Never Used  Substance Use Topics  . Alcohol use: No  . Drug use: Yes    Types: Marijuana, "Crack" cocaine, Cocaine     Allergies   Patient has no known allergies.   Review of Systems Review of Systems Ten systems reviewed and are negative for acute change, except as noted in the HPI.    Physical Exam Updated Vital Signs BP (!) 112/40 (BP Location: Left Arm)   Pulse 79   Temp 99.5 F (37.5 C) (Oral)   Resp 18   SpO2 98%   Physical Exam  Constitutional: He is oriented to person, place, and time. He appears well-developed and well-nourished. No distress.  Nontoxic appearing and in NAD  HENT:  Head: Normocephalic and atraumatic.  Eyes: Conjunctivae and EOM are normal. No scleral icterus.  Neck: Normal range of  motion.  Cardiovascular: Normal rate, regular rhythm and intact distal pulses.  Pulmonary/Chest: Effort normal. No stridor. No respiratory distress. He has no wheezes. He has no rales.  Respirations even and unlabored. Lungs CTAB. Intermittent dry cough.  Musculoskeletal: Normal range of motion.  Neurological: He is alert and oriented to person, place, and time. He exhibits normal muscle tone. Coordination normal.  Skin: Skin is warm and dry. No rash noted. He is not diaphoretic. No erythema. No pallor.  Psychiatric: He has a normal mood and  affect. His behavior is normal.  Cooperative. Not responding to internal stimuli.  Nursing note and vitals reviewed.    ED Treatments / Results  Labs (all labs ordered are listed, but only abnormal results are displayed) Labs Reviewed  COMPREHENSIVE METABOLIC PANEL - Abnormal; Notable for the following components:      Result Value   Creatinine, Ser 1.26 (*)    Albumin 3.3 (*)    All other components within normal limits  ACETAMINOPHEN LEVEL - Abnormal; Notable for the following components:   Acetaminophen (Tylenol), Serum <10 (*)    All other components within normal limits  RAPID URINE DRUG SCREEN, HOSP PERFORMED - Abnormal; Notable for the following components:   Cocaine POSITIVE (*)    Tetrahydrocannabinol POSITIVE (*)    All other components within normal limits  ETHANOL  SALICYLATE LEVEL  CBC    EKG None  Radiology Dg Chest 2 View  Result Date: 06/23/2018 CLINICAL DATA:  42 year old male with upper respiratory symptoms. EXAM: CHEST - 2 VIEW COMPARISON:  Chest radiograph dated 06/07/2018 FINDINGS: The heart size and mediastinal contours are within normal limits. Both lungs are clear. The visualized skeletal structures are unremarkable. IMPRESSION: No active cardiopulmonary disease. Electronically Signed   By: Elgie Collard M.D.   On: 06/23/2018 01:54    Procedures Procedures (including critical care time)  Medications Ordered in ED Medications  benzonatate (TESSALON) capsule 100 mg (100 mg Oral Given 06/23/18 0045)  loratadine (CLARITIN) tablet 10 mg (10 mg Oral Given 06/23/18 0045)  ibuprofen (ADVIL,MOTRIN) tablet 600 mg (600 mg Oral Given 06/23/18 0045)  zolpidem (AMBIEN) tablet 10 mg (10 mg Oral Given 06/23/18 0152)  amLODipine (NORVASC) tablet 5 mg (has no administration in time range)  benztropine (COGENTIN) tablet 1 mg (has no administration in time range)  haloperidol (HALDOL) tablet 10 mg (has no administration in time range)  traZODone (DESYREL) tablet 50  mg (has no administration in time range)  haloperidol lactate (HALDOL) injection 5 mg (5 mg Intramuscular Given 06/23/18 0438)     Initial Impression / Assessment and Plan / ED Course  I have reviewed the triage vital signs and the nursing notes.  Pertinent labs & imaging results that were available during my care of the patient were reviewed by me and considered in my medical decision making (see chart for details).     42 year old male presents to the ED for persistent paranoia, suicidal ideations, homicidal ideations.  He has been medically cleared and evaluated by TTS who recommend inpatient treatment.  He is pending placement.  Disposition to be determined by oncoming ED provider.   Final Clinical Impressions(s) / ED Diagnoses   Final diagnoses:  Viral upper respiratory tract infection  Paranoia (HCC)  Polysubstance abuse [facility])    ED Discharge Orders    None       Antony Madura, PA-C 06/23/18 1610    Geoffery Lyons, MD 06/23/18 [phone]

## 2018-06-23 NOTE — BH Assessment (Signed)
Tele Assessment Note   Patient Name: Dimarco Minkin MRN: [id number] Referring Physician: Dr. Judd Lien  Location of Patient: [facility] Location of Provider: [facility]  Jamarien Rodkey is an 42 y.o., single male. Pt presented to [facility] voluntarily, alone due to reports of auditory hallucinations, suicidal ideations and homicidal ideations. Pt stated, "I just want to die. It's so bad. The medication is not working." Pt reports that he has a hx of Schizophrenia. Pt reports homicidal and suicidal ideations. Pt reports that he has plans to shoot himself with the two 9mm hand guns that he has access to. Pt reports that he hears demonic voices and sees people walking around him. Pt stated that he is paranoid to the point that he wants to hurt people to prevent them from hurting him. Pt reported feelings of hopelessness and worthlessness. Pt reports intermittent issues with his appetite and no sleep. Pt reports severe Cocaine use. Pt reports using a few times a week, or whenever he has access to crack-cocaine. Pt reports last use 06/22/2018 in the amount of $200 worth. Pt reports minimal marijuana use. Pt reports being on the Haldol shot monthly. Pt reports that the medications are not effective. Pt reports that he is a client of [employer] [facility] the [place]. Pt reports a hx of emotional trauma in childhood.   Pt reports that he receives SSI, is single, and lives alone. Pt reports no legal involvement and denies being on probation. Pt reports that he still does not feel safe in his home. Pt reports that he is his own legal guardian. Pt denies having supports.   Pt oriented to person, place, time and situation. Pt presented alert, dressed appropriately in hospital scrubs and groomed. Pt spoke clearly, coherently and did not seem to be under the influence of any substances. Pt's speech was tangential Pt made decent eye contact and answered questions appropriately, but had to be asked questions more  than once due to delayed responses. Pt seemed to be responding to internal stimuli. Pt presented anixous, but and open to the assessment process. Pt presented with no impairments of remote or recent memory. Pt was very tearful during assessment.   Diagnosis: F25.0 Schizoaffective disorder, Bipolar type F14.20 Cocaine use disorder, Severe  Past Medical History:  Past Medical History:  Diagnosis Date  . Depression   . Obesity   . Schizophrenia (HCC)     History reviewed. No pertinent surgical history.  Family History:  Family History  Problem Relation Age of Onset  . Schizophrenia Mother     Social History:  reports that he has been smoking cigarettes. He has been smoking about 0.50 packs per day. He has never used smokeless tobacco. He reports that he has current or past drug history. Drugs: Marijuana, "Crack" cocaine, and Cocaine. He reports that he does not drink alcohol.  Additional Social History:  Alcohol / Drug Use Pain Medications: SEE MAR  Prescriptions: Pt reports that he is currently recieving a Haldol shot monthly.  Over the Counter: SEE MAR  History of alcohol / drug use?: Yes Longest period of sobriety (when/how long): Pt denies.  Substance #1 Name of Substance 1: Cocaine  1 - Age of First Use: 39 1 - Amount (size/oz): $200 Worth  1 - Frequency: 3-4 times weekly  1 - Duration: 2 Years  1 - Last Use / Amount: 06/22/2018  CIWA: CIWA-Ar BP: (!) 112/40 Pulse Rate: 79 COWS:    Allergies: No Known Allergies  Home Medications:  (  Not in a hospital admission)  OB/GYN Status:  No LMP for male patient.  General Assessment Data Assessment unable to be completed: Yes Reason for not completing assessment: [place] of referrals Location of Assessment: [place] ED TTS Assessment: In system Is this a Tele or Face-to-Face Assessment?: Tele Assessment Is this an Initial Assessment or a Re-assessment for this encounter?: Initial Assessment Patient Accompanied by:: (Pt  alone. ) Language Other than English: No Living Arrangements: Other (Comment)(Own Home ) What gender do you identify as?: Male Marital status: Single Maiden name: N/A Pregnancy Status: No Living Arrangements: Alone Can pt return to current living arrangement?: Yes Admission Status: Voluntary Is patient capable of signing voluntary admission?: Yes Referral Source: Self/Family/Friend Insurance type: Medicaid   Medical Screening Exam [facility] Walk-in ONLY) Medical Exam completed: Yes  Crisis Care Plan Living Arrangements: Alone Name of Psychiatrist: [facility] [place] Name of Therapist: [facility] [employer]  Education Status Is patient currently in school?: No Highest grade of school patient has completed: 10 th grade Is the patient employed, unemployed or receiving disability?: Receiving disability income  Risk to self with the past 6 months Suicidal Ideation: Yes-Currently Present Has patient been a risk to self within the past 6 months prior to admission? : Yes Suicidal Intent: Yes-Currently Present Has patient had any suicidal intent within the past 6 months prior to admission? : Yes Is patient at risk for suicide?: Yes Suicidal Plan?: Yes-Currently Present Has patient had any suicidal plan within the past 6 months prior to admission? : Yes Specify Current Suicidal Plan: Shoot himself in the head.  Access to Means: Yes Specify Access to Suicidal Means: Pt reports having access to two 90mm hand guns.  What has been your use of drugs/alcohol within the last 12 months?: Pt reports crack cocaine use.  Previous Attempts/Gestures: Yes How many times?: 10 Other Self Harm Risks: Pt reports cutting himself.  Triggers for Past Attempts: Hallucinations Intentional Self Injurious Behavior: Cutting Comment - Self Injurious Behavior: Pt reports cutting his arms. Family Suicide History: Yes Recent stressful life event(s): Other (Comment)(Pt denies. ) Persecutory  voices/beliefs?: Yes Depression: Yes Depression Symptoms: Insomnia, Tearfulness, Fatigue, Guilt, Loss of interest in usual pleasures, Feeling worthless/self pity, Feeling angry/irritable Substance abuse history and/or treatment for substance abuse?: No Suicide prevention information given to non-admitted patients: Not applicable  Risk to Others within the past 6 months Homicidal Ideation: Yes-Currently Present Does patient have any lifetime risk of violence toward others beyond the six months prior to admission? : Yes (comment) Thoughts of Harm to Others: Yes-Currently Present Comment - Thoughts of Harm to Others: Pt reports that he feels others are trying to kill him.  Current Homicidal Intent: No Current Homicidal Plan: No Describe Current Homicidal Plan: Pt denied.  Access to Homicidal Means: Yes Describe Access to Homicidal Means: Pt reports having access to guns.  Identified Victim: Pt denied.  History of harm to others?: No Assessment of Violence: On admission Violent Behavior Description: N/A Does patient have access to weapons?: Yes (Comment) Criminal Charges Pending?: No Does patient have a court date: No Is patient on probation?: No  Psychosis Hallucinations: Auditory, Visual(Pt reports seeing people and hearing demonic voices. ) Delusions: Persecutory(Pt reports that he feels people are trying to hurt him. )  Mental Status Report Appearance/Hygiene: In scrubs, Unremarkable Eye Contact: Good Motor Activity: Unremarkable Speech: Logical/coherent, Aggressive Level of Consciousness: Alert Mood: Despair, Fearful Affect: Fearful Anxiety Level: Severe Thought Processes: Coherent, Relevant Judgement: Impaired Orientation:  Person, Place, Time, Situation, Appropriate for developmental age Obsessive Compulsive Thoughts/Behaviors: None  Cognitive Functioning Concentration: Poor Memory: Recent Intact, Remote Intact Is patient IDD: No Insight: Poor Impulse Control:  Poor Appetite: Fair Have you had any weight changes? : No Change Sleep: Decreased Total Hours of Sleep: 0 Vegetative Symptoms: None  ADLScreening [facility] Services) Patient's cognitive ability adequate to safely complete daily activities?: Yes Patient able to express need for assistance with ADLs?: Yes Independently performs ADLs?: Yes (appropriate for developmental age)  Prior Inpatient Therapy Prior Inpatient Therapy: Yes Prior Therapy Dates: 2019 Prior Therapy Facilty/Provider(s): [facility]; [facility] Reason for Treatment: Schizophrenia  Prior Outpatient Therapy Prior Outpatient Therapy: Yes Prior Therapy Dates: Current  Prior Therapy Facilty/Provider(s): [facility] [place] Reason for Treatment: Schizophrenia Does patient have an ACCT team?: No Does patient have Intensive In-House Services?  : No Does patient have [facility] services? : No Does patient have P4CC services?: No  ADL Screening (condition at time of admission) Patient's cognitive ability adequate to safely complete daily activities?: Yes Is the patient deaf or have difficulty hearing?: No Does the patient have difficulty seeing, even when wearing glasses/contacts?: No Does the patient have difficulty concentrating, remembering, or making decisions?: No Patient able to express need for assistance with ADLs?: Yes Does the patient have difficulty dressing or bathing?: No Independently performs ADLs?: Yes (appropriate for developmental age) Does the patient have difficulty walking or climbing stairs?: No Weakness of Legs: None Weakness of Arms/Hands: None  Home Assistive Devices/Equipment Home Assistive Devices/Equipment: None  Therapy Consults (therapy consults require a physician order) PT Evaluation Needed: No OT Evalulation Needed: No SLP Evaluation Needed: No Abuse/Neglect Assessment (Assessment to be complete while patient is alone) Abuse/Neglect Assessment Can Be Completed:  Yes Physical Abuse: Denies Verbal Abuse: Yes, past (Comment) Sexual Abuse: Denies Exploitation of patient/patient's resources: Denies Self-Neglect: Denies Values / Beliefs Cultural Requests During Hospitalization: None Spiritual Requests During Hospitalization: None Consults Spiritual Care Consult Needed: No Social Work Consult Needed: No Merchant navy officer (For Healthcare) Does Patient Have a Medical Advance Directive?: No Would patient like information on creating a medical advance directive?: No - Patient declined          Disposition: Per Donell Sievert, PA; Pt meets criteria for inpatient treatment. TTS will work on placement for pt.  Disposition Initial Assessment Completed for this Encounter: Yes Patient referred to: [facility] refused(AC, Kim- No bed available at this time. )  This service was provided via telemedicine using a 2-way, interactive audio and Immunologist.  Names of all persons participating in this telemedicine service and their role in this encounter. Name: Arlan Organ Role: Patient   Name: Chesley Noon  Role: Clinician   Name:  Role:   Name:  Role:    Chesley Noon, M.S., [facility], LCAS Triage Specialist [facility] 06/23/2018 3:19 AM

## 2018-06-23 NOTE — ED Notes (Signed)
Upon discharge pt denies suicidal or homicidal thoughts

## 2018-06-23 NOTE — Discharge Instructions (Addendum)
Please return for any problem. Follow up with your regular physician in 2-3 days.  °

## 2018-06-23 NOTE — Progress Notes (Addendum)
Patient is seen by me via tele-psych and I have consulted with Dr. Lucianne Muss.  Patient denies any suicidal or homicidal ideations.  Patient reports still having some hallucinations of voices.  He reports that the medications he was prescribed from [facility] did not seem to be effective.  He does report that he still has some of those medications as he was discharged 5 days ago and he was giving 7 days samples and 30-day prescriptions.  Patient states that he lives in an apartment along with his to 9 mm.  Patient is then asked if he has a felony charge which gives me the writer to contact the police to have his weapons picked up from his apartment.  Patient then retracted his statement and states "well there actually at a friend and I have to find a friend, and I do not have him with me."  He is instructed to follow-up with outpatient resources for his hallucinations.  Patient then requested a bus passes to get him to [place], where I suspect that he will try to get admitted to a hospital there.  Patient does not meet inpatient criteria and is psychiatrically cleared.  I attempted to contact Dr. Mammie Russian but she was busy with a critical patient.  Dr. Mammie Russian is welcome to contact me back if she has any questions.

## 2018-06-23 NOTE — Progress Notes (Signed)
Nurse, Thurston Pounds informed of pt disposition.

## 2018-07-04 ENCOUNTER — Emergency Department (HOSPITAL_COMMUNITY)
Admission: EM | Admit: 2018-07-04 | Discharge: 2018-07-04 | Disposition: A | Discharge status: Home / Self Care | Payer: Medicaid Other | Attending: Emergency Medicine | Admitting: Emergency Medicine

## 2018-07-04 ENCOUNTER — Other Ambulatory Visit: Payer: Self-pay

## 2018-07-04 DIAGNOSIS — F17210 Nicotine dependence, cigarettes, uncomplicated: Secondary | ICD-10-CM | POA: Diagnosis not present

## 2018-07-04 DIAGNOSIS — Z79899 Other long term (current) drug therapy: Secondary | ICD-10-CM | POA: Insufficient documentation

## 2018-07-04 DIAGNOSIS — R440 Auditory hallucinations: Secondary | ICD-10-CM | POA: Diagnosis not present

## 2018-07-04 DIAGNOSIS — I10 Essential (primary) hypertension: Secondary | ICD-10-CM | POA: Diagnosis not present

## 2018-07-04 NOTE — ED Triage Notes (Addendum)
Pt to ED for evaluation of ongoing hallucinations x1 week telling him to "go on a purge" and hurt/kill others. Reports that hallucinations went away for one day after last hospitalization and now they are back. Pt's plan for purge is to stab others in the throat.

## 2018-07-05 ENCOUNTER — Emergency Department (HOSPITAL_COMMUNITY)
Admission: EM | Admit: 2018-07-05 | Discharge: 2018-07-05 | Disposition: A | Discharge status: Home / Self Care | Payer: Medicaid Other | Attending: Emergency Medicine | Admitting: Emergency Medicine

## 2018-07-05 DIAGNOSIS — F17210 Nicotine dependence, cigarettes, uncomplicated: Secondary | ICD-10-CM | POA: Insufficient documentation

## 2018-07-05 DIAGNOSIS — R440 Auditory hallucinations: Secondary | ICD-10-CM | POA: Insufficient documentation

## 2018-07-05 NOTE — ED Provider Notes (Signed)
TIME SEEN: 12:49 AM  CHIEF COMPLAINT: Hallucinations  HPI: Patient is a 42 year old male with history of schizophrenia who checks back in with auditory hallucinations.  Was just seen several hours ago for the same.  Discharge with plan to follow-up with [facility].  States that he is hearing voices telling him to kill himself.  This is something the patient chronically has.  He states it is unchanged.  He states that he was in the waiting room and security asked him to leave and that is why he checked back in.  He was provided with a bus pass earlier this evening after being evaluated and with plans to follow-up with [facility].  He still feels comfortable and safe with this plan.  No HI.  No visual hallucinations.  ROS: See HPI Constitutional: no fever  Eyes: no drainage  ENT: no runny nose   Cardiovascular:  no chest pain  Resp: no SOB  GI: no vomiting GU: no dysuria Integumentary: no rash  Allergy: no hives  Musculoskeletal: no leg swelling  Neurological: no slurred speech ROS otherwise negative  PAST MEDICAL HISTORY/PAST SURGICAL HISTORY:  Past Medical History:  Diagnosis Date  . Depression   . Obesity   . Schizophrenia (HCC)     MEDICATIONS:  Prior to Admission medications   Medication Sig Start Date End Date Taking? Authorizing Provider  amLODipine (NORVASC) 5 MG tablet Take 1 tablet (5 mg total) by mouth daily. For high blood pressure Patient not taking: Reported on 07/04/2018 06/15/18 06/15/19  Haskell RilingMcNew, Holly R, MD  benztropine (COGENTIN) 1 MG tablet Take 1 tablet (1 mg total) by mouth at bedtime. For prevention of drug induced trmors Patient not taking: Reported on 07/04/2018 06/15/18   Haskell RilingMcNew, Holly R, MD  haloperidol (HALDOL) 10 MG tablet Take 1 tablet (10 mg total) by mouth at bedtime. Patient not taking: Reported on 07/04/2018 06/15/18   Haskell RilingMcNew, Holly R, MD  traZODone (DESYREL) 50 MG tablet Take 1 tablet (50 mg total) by mouth at bedtime. Patient not taking: Reported on  07/04/2018 06/15/18   Haskell RilingMcNew, Holly R, MD    ALLERGIES:  No Known Allergies  SOCIAL HISTORY:  Social History   Tobacco Use  . Smoking status: Current Every Day Smoker    Packs/day: 0.50    Types: Cigarettes  . Smokeless tobacco: Never Used  Substance Use Topics  . Alcohol use: No    FAMILY HISTORY: Family History  Problem Relation Age of Onset  . Schizophrenia Mother     EXAM: BP 137/87 (BP Location: Right Arm)   Pulse 63   Temp 98.7 F (37.1 C) (Oral)   Resp 18   SpO2 100%  CONSTITUTIONAL: Alert and oriented and responds appropriately to questions. Well-appearing; well-nourished, obese HEAD: Normocephalic EYES: Conjunctivae clear, pupils appear equal, EOMI ENT: normal nose; moist mucous membranes NECK: Supple, no meningismus, no nuchal rigidity, no LAD  CARD: RRR; S1 and S2 appreciated; no murmurs, no clicks, no rubs, no gallops RESP: Normal chest excursion without splinting or tachypnea; breath sounds clear and equal bilaterally; no wheezes, no rhonchi, no rales, no hypoxia or respiratory distress, speaking full sentences ABD/GI: Normal bowel sounds; non-distended; soft, non-tender, no rebound, no guarding, no peritoneal signs, no hepatosplenomegaly BACK:  The back appears normal and is non-tender to palpation, there is no CVA tenderness EXT: Normal ROM in all joints; non-tender to palpation; no edema; normal capillary refill; no cyanosis, no calf tenderness or swelling    SKIN: Normal color for age and race;  warm; no rash NEURO: Moves all extremities equally PSYCH: Patient endorses auditory hallucinations.  No plan.  No HI.  MEDICAL DECISION MAKING: Patient here with auditory hallucinations.  This is chronic for him.  He has no change in his psychiatric symptoms.  No medical complaints at this time.  States he checked back in because security was asking him to leave and it is cold outside.  He has a plan to follow-up with [facility] in the morning and he is still  comfortable with this plan and feels safe with this plan.  I do not feel he needs psychiatric evaluation at this time or repeat work-up.  He feels comfortable with plan to be discharged.   At this time, I do not feel there is any life-threatening condition present. I have reviewed and discussed all results (EKG, imaging, lab, urine as appropriate) and exam findings with patient/family. I have reviewed nursing notes and appropriate previous records.  I feel the patient is safe to be discharged home without further emergent workup and can continue workup as an outpatient as needed. Discussed usual and customary return precautions. Patient/family verbalize understanding and are comfortable with this plan.  Outpatient follow-up has been provided if needed. All questions have been answered.    Felder Lebeda, Layla Maw, DO 07/05/18 [phone]

## 2018-07-06 NOTE — ED Provider Notes (Signed)
[facility] EMERGENCY DEPARTMENT Provider Note   CSN: [id number] Arrival date & time: 07/04/18  1856     History   Chief Complaint No chief complaint on file.   HPI Isaia Hassell is a 42 y.o. male.  HPI 42 year old male with history of schizophrenia presents emergency department with chronic auditory hallucinations.  No homicidal or suicidal thoughts.  He stated this to nursing staff however he now would ask that statement.  He reports intermittent compliance with his medications.  He was recently hospitalized and discharged on 06/16/2018.  He states he currently is not following with an outpatient psychiatrist.  He is getting his medications intermittently from the hospital.  No fevers or chills.  No headache.  No other complaints symptoms are mild in severity.   Past Medical History:  Diagnosis Date  . Depression   . Obesity   . Schizophrenia [facility])     Patient Active Problem List   Diagnosis Date Noted  . Malingering 06/12/2018  . Tobacco use disorder 06/09/2018  . HTN (hypertension) 06/09/2018  . Schizoaffective disorder, depressive type (HCC) 08/04/2017  . 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 Polysubstance abuse (HCC) 02/27/2017  . Substance induced mood disorder (HCC) 02/27/2017  . Cocaine use disorder, severe, dependence (HCC) 01/05/2017  . 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 Cocaine use disorder, mild, abuse (HCC) 05/03/2016  . Cannabis use disorder, mild, abuse 05/03/2016  . Suicidal ideations 04/30/2016  . Hyperprolactinemia (HCC) 09/24/2015  . Alcohol use disorder, moderate, dependence (HCC) 09/22/2015  . Morbid obesity (HCC) 09/22/2015    No past surgical history on file.      Home Medications    Prior to Admission medications   Medication Sig Start Date End Date Taking? Authorizing Provider  amLODipine (NORVASC) 5 MG tablet Take 1 tablet (5 mg total) by mouth daily. For high blood pressure Patient  not taking: Reported on 07/04/2018 06/15/18 06/15/19  Haskell Riling, MD  benztropine (COGENTIN) 1 MG tablet Take 1 tablet (1 mg total) by mouth at bedtime. For prevention of drug induced trmors Patient not taking: Reported on 07/04/2018 06/15/18   Haskell Riling, MD  haloperidol (HALDOL) 10 MG tablet Take 1 tablet (10 mg total) by mouth at bedtime. Patient not taking: Reported on 07/04/2018 06/15/18   Haskell Riling, MD  traZODone (DESYREL) 50 MG tablet Take 1 tablet (50 mg total) by mouth at bedtime. Patient not taking: Reported on 07/04/2018 06/15/18   Haskell Riling, MD    Family History Family History  Problem Relation Age of Onset  . Schizophrenia Mother     Social History Social History   Tobacco Use  . Smoking status: Current Every Day Smoker    Packs/day: 0.50    Types: Cigarettes  . Smokeless tobacco: Never Used  Substance Use Topics  . Alcohol use: No  . Drug use: Yes    Types: Marijuana, "Crack" cocaine, Cocaine     Allergies   Patient has no known allergies.   Review of Systems Review of Systems  All other systems reviewed and are negative.    Physical Exam Updated Vital Signs BP 116/66 (BP Location: Right Arm)   Pulse 66   Temp 98.1 F (36.7 C) (Oral)   Ht 5\' 8"  (1.727 m)   Wt (!) 172.4 kg   SpO2 100%   BMI 57.78 kg/m   Physical Exam  Constitutional: He is oriented to person, place, and time. He appears well-developed and well-nourished.  HENT:  Head: Normocephalic and atraumatic.  Eyes: EOM are normal.  Neck: Normal range of motion.  Cardiovascular: Normal rate, regular rhythm, normal heart sounds and intact distal pulses.  Pulmonary/Chest: Effort normal and breath sounds normal. No respiratory distress.  Abdominal: Soft. He exhibits no distension. There is no tenderness.  Musculoskeletal: Normal range of motion.  Neurological: He is alert and oriented to person, place, and time.  Skin: Skin is warm and dry.  Psychiatric: He has a normal  mood and affect. Judgment normal.  Nursing note and vitals reviewed.    ED Treatments / Results  Labs (all labs ordered are listed, but only abnormal results are displayed) Labs Reviewed - No data to display  EKG None  Radiology No results found.  Procedures Procedures (including critical care time)  Medications Ordered in ED Medications - No data to display   Initial Impression / Assessment and Plan / ED Course  I have reviewed the triage vital signs and the nursing notes.  Pertinent labs & imaging results that were available during my care of the patient were reviewed by me and considered in my medical decision making (see chart for details).     he does not appear to be a threat to himself or others at this time.  [facility]  Final Clinical Impressions(s) / ED Diagnoses   Final diagnoses:  Auditory hallucination    ED Discharge Orders    None       Azalia Bilisampos, Trevor Duty, MD 07/06/18 [phone]

## 2018-11-15 ENCOUNTER — Emergency Department (HOSPITAL_COMMUNITY)
Admission: EM | Admit: 2018-11-15 | Discharge: 2018-11-16 | Disposition: A | Discharge status: Home / Self Care | Payer: Medicaid Other | Attending: Emergency Medicine | Admitting: Emergency Medicine

## 2018-11-15 ENCOUNTER — Other Ambulatory Visit: Payer: Self-pay

## 2018-11-15 ENCOUNTER — Encounter (HOSPITAL_COMMUNITY): Payer: Self-pay | Admitting: Emergency Medicine

## 2018-11-15 DIAGNOSIS — F1994 Other psychoactive substance use, unspecified with psychoactive substance-induced mood disorder: Secondary | ICD-10-CM | POA: Diagnosis not present

## 2018-11-15 DIAGNOSIS — F17210 Nicotine dependence, cigarettes, uncomplicated: Secondary | ICD-10-CM | POA: Insufficient documentation

## 2018-11-15 DIAGNOSIS — F329 Major depressive disorder, single episode, unspecified: Secondary | ICD-10-CM | POA: Diagnosis present

## 2018-11-15 DIAGNOSIS — I10 Essential (primary) hypertension: Secondary | ICD-10-CM | POA: Insufficient documentation

## 2018-11-15 DIAGNOSIS — F251 Schizoaffective disorder, depressive type: Secondary | ICD-10-CM | POA: Diagnosis not present

## 2018-11-15 DIAGNOSIS — R45851 Suicidal ideations: Secondary | ICD-10-CM | POA: Insufficient documentation

## 2018-11-15 HISTORY — DX: Essential (primary) hypertension: I10

## 2018-11-15 LAB — COMPREHENSIVE METABOLIC PANEL
ALBUMIN: 3.7 g/dL (ref 3.5–5.0)
ALT: 32 U/L (ref 0–44)
AST: 33 U/L (ref 15–41)
Alkaline Phosphatase: 63 U/L (ref 38–126)
Anion gap: 7 (ref 5–15)
BUN: 17 mg/dL (ref 6–20)
CALCIUM: 9.1 mg/dL (ref 8.9–10.3)
CHLORIDE: 102 mmol/L (ref 98–111)
CO2: 31 mmol/L (ref 22–32)
Creatinine, Ser: 0.81 mg/dL (ref 0.61–1.24)
GFR calc Af Amer: >60 mL/min (ref >60)
GFR calc non Af Amer: >60 mL/min (ref >60)
GLUCOSE: 74 mg/dL (ref 70–99)
POTASSIUM: 3.6 mmol/L (ref 3.5–5.1)
SODIUM: 140 mmol/L (ref 135–145)
Total Bilirubin: 0.6 mg/dL (ref 0.3–1.2)
Total Protein: 8.2 g/dL — ABNORMAL HIGH (ref 6.5–8.1)

## 2018-11-15 LAB — RAPID URINE DRUG SCREEN, HOSP PERFORMED
Amphetamines: NOT DETECTED
BENZODIAZEPINES: POSITIVE — AB
Barbiturates: NOT DETECTED
COCAINE: NOT DETECTED
OPIATES: POSITIVE — AB
TETRAHYDROCANNABINOL: NOT DETECTED

## 2018-11-15 LAB — CBC WITH DIFFERENTIAL/PLATELET
ABS IMMATURE GRANULOCYTES: 0.02 10*3/uL (ref 0.00–0.07)
Basophils Absolute: 0 10*3/uL (ref 0.0–0.1)
Basophils Relative: 0 %
EOS ABS: 0.2 10*3/uL (ref 0.0–0.5)
Eosinophils Relative: 2 %
HEMATOCRIT: 38.6 % — AB (ref 39.0–52.0)
Hemoglobin: 11.8 g/dL — ABNORMAL LOW (ref 13.0–17.0)
IMMATURE GRANULOCYTES: 0 %
LYMPHS ABS: 2 10*3/uL (ref 0.7–4.0)
Lymphocytes Relative: 29 %
MCH: 29.1 pg (ref 26.0–34.0)
MCHC: 30.6 g/dL (ref 30.0–36.0)
MCV: 95.3 fL (ref 80.0–100.0)
MONO ABS: 0.4 10*3/uL (ref 0.1–1.0)
MONOS PCT: 5 %
NEUTROS ABS: 4.3 10*3/uL (ref 1.7–7.7)
NEUTROS PCT: 64 %
Platelets: 231 10*3/uL (ref 150–400)
RBC: 4.05 MIL/uL — ABNORMAL LOW (ref 4.22–5.81)
RDW: 16 % — ABNORMAL HIGH (ref 11.5–15.5)
WBC: 6.9 10*3/uL (ref 4.0–10.5)
nRBC: 0 % (ref 0.0–0.2)

## 2018-11-15 LAB — ETHANOL: Alcohol, Ethyl (B): <10 mg/dL (ref <10)

## 2018-11-15 MED ORDER — HALOPERIDOL 5 MG PO TABS
10.0000 mg | ORAL_TABLET | Freq: Every day | ORAL | Status: DC
  Filled 2018-11-15: qty 2

## 2018-11-15 MED ORDER — BENZONATATE 100 MG PO CAPS
100.00 | ORAL_CAPSULE | ORAL | Status: DC
Start: ? — End: 2018-11-15

## 2018-11-15 MED ORDER — HALOPERIDOL 5 MG PO TABS: 5.0000 mg | ORAL_TABLET | Freq: Every morning | ORAL | Status: DC

## 2018-11-15 MED ORDER — TRAZODONE HCL 100 MG PO TABS
100.0000 mg | ORAL_TABLET | Freq: Every day | ORAL | Status: DC
  Filled 2018-11-15: qty 1

## 2018-11-15 MED ORDER — HALOPERIDOL DECANOATE 100 MG/ML IM SOLN
100.0000 mg | Freq: Once | INTRAMUSCULAR | Status: DC
  Filled 2018-11-15: qty 1

## 2018-11-15 MED ORDER — GUAIFENESIN-CODEINE 100-10 MG/5ML PO SOLN
10.00 | ORAL | Status: DC
Start: ? — End: 2018-11-15

## 2018-11-15 MED ORDER — ENOXAPARIN SODIUM 40 MG/0.4ML ~~LOC~~ SOLN
40.00 | SUBCUTANEOUS | Status: DC
Start: 2018-11-14 — End: 2018-11-15

## 2018-11-15 MED ORDER — TRAZODONE HCL 50 MG PO TABS
150.00 | ORAL_TABLET | ORAL | Status: DC
Start: 2018-11-13 — End: 2018-11-15

## 2018-11-15 MED ORDER — IPRATROPIUM-ALBUTEROL 0.5-2.5 (3) MG/3ML IN SOLN
3.00 | RESPIRATORY_TRACT | Status: DC
Start: ? — End: 2018-11-15

## 2018-11-15 MED ORDER — SALINE NASAL SPRAY 0.65 % NA SOLN
1.00 | NASAL | Status: DC
Start: ? — End: 2018-11-15

## 2018-11-15 MED ORDER — ACETAMINOPHEN 325 MG PO TABS
650.00 | ORAL_TABLET | ORAL | Status: DC
Start: ? — End: 2018-11-15

## 2018-11-15 MED ORDER — GABAPENTIN 100 MG PO CAPS
100.00 | ORAL_CAPSULE | ORAL | Status: DC
Start: 2018-11-13 — End: 2018-11-15

## 2018-11-15 MED ORDER — AMLODIPINE BESYLATE 5 MG PO TABS
5.00 | ORAL_TABLET | ORAL | Status: DC
Start: 2018-11-14 — End: 2018-11-15

## 2018-11-15 NOTE — Progress Notes (Signed)
Received Bruce [name] this PM in his room asleep in a sitting position on his bed. He was cooperative with his vital signs assessment , requested and received a snack. He verbalized the desire to self harm and others are gone at the present time. He denied hearing voices and seeling shallows at the present time. He denied his PM medications. He slept in an upright position on his bed without incident.

## 2018-11-15 NOTE — ED Notes (Signed)
Pt A&O x 3, no distress noted, calm & cooperative, presents with SI, plan to shoot self with gun and HI plan to shoot others with a gun,  Reports hearing voices to kill self and seeing shadows.  Pt under IVC.  Complaint of Productive cough x 2 mos with greenish phlegm.  Diagnosed with Pneumonia x 2 mos ago.  Monitoring for safety, Q 15 min checks in effect.  Pt obese and changed into 2 gowns and wanded by security.
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
## 2018-11-15 NOTE — ED Triage Notes (Signed)
Pt escorted by [employer], pt under IVC presents with SI/HI and AVH, complaint of productive cough x 2 mos, diagnosed with Pneumonia in last 2 mos.

## 2018-11-15 NOTE — ED Provider Notes (Signed)
Bruce [name]   CSN: [id number] Arrival date & time: 11/15/18  1350    History   Chief Complaint Chief Complaint  Patient presents with  . Suicidal/IVC    HPI Bruce [name] is a 43 y.o. male.     43 year old male with past medical history including polysubstance abuse, schizophrenia, depression, hypertension who presents with suicidal ideation.  Patient states that he has a history of depression, has not had his Abilify in several months.  He reports that recently his depression has been worse because his mother's health is not doing well and because his roommate last night kicked him out.  Today he went to a crisis center and told them that he was going to shoot himself with a 9 mm.  He does not personally have a gun but he states that his friend does.  He reports history of self-injurious behavior including cutting.  He reports history of drug problems but denies any drug or alcohol use currently.  The history is provided by the patient.    Past Medical History:  Diagnosis Date  . Depression   . Hypertension   . Obesity   . Schizophrenia [facility])     Patient Active Problem List   Diagnosis Date Noted  . Malingering 06/12/2018  . Tobacco use disorder 06/09/2018  . HTN (hypertension) 06/09/2018  . Schizoaffective disorder, depressive type (HCC) 08/04/2017  . 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 Polysubstance abuse (HCC) 02/27/2017  . Substance induced mood disorder (HCC) 02/27/2017  . Cocaine use disorder, severe, dependence (HCC) 01/05/2017  . 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 Cocaine use disorder, mild, abuse (HCC) 05/03/2016  . Cannabis use disorder, mild, abuse 05/03/2016  . Suicidal ideations 04/30/2016  . Hyperprolactinemia (HCC) 09/24/2015  . Alcohol use disorder, moderate, dependence (HCC) 09/22/2015  . Morbid obesity (HCC) 09/22/2015    History reviewed. No pertinent surgical  history.      Home Medications    Prior to Admission medications   Medication Sig Start Date End Date Taking? Authorizing Provider  amLODipine (NORVASC) 5 MG tablet Take 1 tablet (5 mg total) by mouth daily. For high blood pressure Patient not taking: Reported on 07/04/2018 06/15/18 06/15/19  Haskell Riling, MD  benztropine (COGENTIN) 1 MG tablet Take 1 tablet (1 mg total) by mouth at bedtime. For prevention of drug induced trmors Patient not taking: Reported on 07/04/2018 06/15/18   Haskell Riling, MD  haloperidol (HALDOL) 10 MG tablet Take 1 tablet (10 mg total) by mouth at bedtime. Patient not taking: Reported on 07/04/2018 06/15/18   Haskell Riling, MD  traZODone (DESYREL) 50 MG tablet Take 1 tablet (50 mg total) by mouth at bedtime. Patient not taking: Reported on 07/04/2018 06/15/18   Haskell Riling, MD    Family History Family History  Problem Relation Age of Onset  . Schizophrenia Mother     Social History Social History   Tobacco Use  . Smoking status: Current Every Day Smoker    Packs/day: 0.50    Types: Cigarettes  . Smokeless tobacco: Never Used  Substance Use Topics  . Alcohol use: No  . Drug use: Yes    Types: Marijuana, "Crack" cocaine, Cocaine     Allergies   Patient has no allergy information on record.   Review of Systems Review of Systems All other systems reviewed and are negative except that which was mentioned in HPI   Physical Exam Updated Vital Signs BP  131/76 (BP Location: Right Arm)   Pulse 79   Temp 98.7 F (37.1 C) (Oral)   Resp 20   SpO2 97%   Physical Exam Vitals signs and nursing [name] reviewed.  Constitutional:      General: He is not in acute distress.    Appearance: He is well-developed.  HENT:     Head: Normocephalic and atraumatic.  Eyes:     Conjunctiva/sclera: Conjunctivae normal.  Neck:     Musculoskeletal: Neck supple.  Skin:    General: Skin is warm and dry.     Comments: Scars on R forearm  Neurological:      Mental Status: He is alert and oriented to person, place, and time.  Psychiatric:        Thought Content: Thought content includes suicidal ideation. Thought content includes suicidal plan.     Comments: Calm, cooperative      ED Treatments / Results  Labs (all labs ordered are listed, but only abnormal results are displayed) Labs Reviewed  COMPREHENSIVE METABOLIC PANEL  ETHANOL  RAPID URINE DRUG SCREEN, HOSP PERFORMED  CBC WITH DIFFERENTIAL/PLATELET    EKG None  Radiology No results found.  Procedures Procedures (including critical care time)  Medications Ordered in ED Medications - No data to display   Initial Impression / Assessment and Plan / ED Course  I have reviewed the triage vital signs and the nursing notes.  Pertinent labs that were available during my care of the patient were reviewed by me and considered in my medical decision making (see chart for details).        PT brought in by [employer] with IVC papers. Given persistent SI, I completed medical form for IVC. I have ordered screening labs and TTS evaluation. Dispo per psychiatry team recommendations.  Final Clinical Impressions(s) / ED Diagnoses   Final diagnoses:  None    ED Discharge Orders    None       Josephanthony Tindel, Ambrose Finland, MD 11/15/18 [phone]

## 2018-11-16 DIAGNOSIS — F251 Schizoaffective disorder, depressive type: Secondary | ICD-10-CM

## 2018-11-16 DIAGNOSIS — F1994 Other psychoactive substance use, unspecified with psychoactive substance-induced mood disorder: Secondary | ICD-10-CM

## 2018-11-16 NOTE — ED Notes (Signed)
Pt continuously ask to be discharge.  Pt is in no distress.  15 minute checks and video monitoring in place.

## 2018-11-16 NOTE — ED Notes (Signed)
Pt discharged safely with resources.  All belongings were returned.  Pt denies S/I and H/I at discharge.

## 2018-11-16 NOTE — Consult Note (Addendum)
[name] Psych ED Discharge  11/16/2018 10:16 AM Bruce [name]  MRN:  [id number] Principal Problem: Substance induced mood disorder [facility]) Discharge Diagnoses: Principal Problem:   Substance induced mood disorder (HCC) Active Problems:   Schizoaffective disorder, depressive type (HCC)   Subjective: Pt was seen and chart reviewed with treatment team and Dr Sharma Covert. Pt denies suicidal/homicidal ideation, denies auditory/visual hallucinations and does not appear to be responding to internal stimuli. Pt is known to this emergency room system and frequently comes with complaint of suicidal and homicidal ideation. He has been living in a group home in [place] until recently.  He stated he is homeless but is going to be living in [place] at [address], which is a Medicaid housing program. Pt went to [name] yesterday and told them he was going to get a gun and shoot himself and kill others. He was placed under IVC and sent to [facility]. Pt did acknowledge his friend has a gun but he also stated he has no intention of using it. Pt stated he was upset yesterday and just "needed a place to sleep and be to himself" so he said what he had to say to get in the ED. His UDS was positive for benzos (prescribed) and opiates, BAL negative. Pt stated the "pain pills" he takes are OTC.   Pt is able to contract for safety upon discharge. Pt is psychiatrically clear.   Total Time spent with patient: 30 minutes  Past Psychiatric History: As above  Past Medical History:  Past Medical History:  Diagnosis Date  . Depression   . Hypertension   . Obesity   . Schizophrenia (HCC)    History reviewed. No pertinent surgical history. Family History:  Family History  Problem Relation Age of Onset  . Schizophrenia Mother    Family Psychiatric  History: As stated above.  Social History:  Social History   Substance and Sexual Activity  Alcohol Use No     Social History   Substance and Sexual Activity  Drug Use Yes  .  Types: Marijuana, "Crack" cocaine, Cocaine    Social History   Socioeconomic History  . Marital status: Single    Spouse name: Not on file  . Number of children: Not on file  . Years of education: Not on file  . Highest education level: Not on file  Occupational History  . Not on file  Social Needs  . Financial resource strain: Not on file  . Food insecurity:    Worry: Not on file    Inability: Not on file  . Transportation needs:    Medical: Not on file    Non-medical: Not on file  Tobacco Use  . Smoking status: Current Every Day Smoker    Packs/day: 0.50    Types: Cigarettes  . Smokeless tobacco: Never Used  Substance and Sexual Activity  . Alcohol use: No  . Drug use: Yes    Types: Marijuana, "Crack" cocaine, Cocaine  . Sexual activity: Not Currently  Lifestyle  . Physical activity:    Days per week: Not on file    Minutes per session: Not on file  . Stress: Not on file  Relationships  . Social connections:    Talks on phone: Not on file    Gets together: Not on file    Attends religious service: Not on file    Active member of club or organization: Not on file    Attends meetings of clubs or organizations: Not on file  Relationship status: Not on file  Other Topics Concern  . Not on file  Social History Narrative  . Not on file    Has this patient used any form of tobacco in the last 30 days? (Cigarettes, Smokeless Tobacco, Cigars, and/or Pipes) Prescription not provided because: Pt declined  Current Medications: Current Facility-Administered Medications  Medication Dose Route Frequency Provider Last Rate Last Dose  . haloperidol (HALDOL) tablet 5 mg  5 mg Oral q morning - 10a Laveda Abbe, NP       And  . haloperidol (HALDOL) tablet 10 mg  10 mg Oral QHS Laveda Abbe, NP      . haloperidol decanoate (HALDOL DECANOATE) 100 MG/ML injection 100 mg  100 mg Intramuscular Once Laveda Abbe, NP      . traZODone (DESYREL) tablet 100  mg  100 mg Oral QHS Laveda Abbe, NP       Current Outpatient Medications  Medication Sig Dispense Refill  . acetaminophen (TYLENOL) 325 MG tablet Take 650 mg by mouth every 6 (six) hours as needed for headache or fever.    Marland Kitchen amLODipine (NORVASC) 5 MG tablet Take 1 tablet (5 mg total) by mouth daily. For high blood pressure 30 tablet 1  . ARIPiprazole (ABILIFY) 5 MG tablet Take 5 mg by mouth daily. For 7 days    . benzonatate (TESSALON) 100 MG capsule Take 100 mg by mouth 3 (three) times daily as needed for cough.    . benztropine (COGENTIN) 1 MG tablet Take 1 tablet (1 mg total) by mouth at bedtime. For prevention of drug induced trmors 30 tablet 1  . doxycycline (VIBRA-TABS) 100 MG tablet Take 100 mg by mouth 2 (two) times daily.    Wilber Oliphant AC 100-10 MG/5ML syrup Take by mouth daily as needed. Take as directed    . haloperidol (HALDOL) 10 MG tablet Take 1 tablet (10 mg total) by mouth at bedtime. 30 tablet 1  . traZODone (DESYREL) 50 MG tablet Take 1 tablet (50 mg total) by mouth at bedtime. 30 tablet 1     Musculoskeletal: Strength & Muscle Tone: within normal limits Gait & Station: normal Patient leans: N/A  Psychiatric Specialty Exam: Physical Exam  Nursing note and vitals reviewed. Constitutional: He is oriented to person, place, and time. He appears well-developed and well-nourished.  HENT:  Head: Normocephalic and atraumatic.  Neck: Normal range of motion.  Respiratory: Effort normal.  Musculoskeletal: Normal range of motion.  Neurological: He is alert and oriented to person, place, and time.  Psychiatric: He has a normal mood and affect. His speech is normal and behavior is normal. Judgment and thought content normal. Cognition and memory are normal.    Review of Systems  Psychiatric/Behavioral: Negative for hallucinations and suicidal ideas.  All other systems reviewed and are negative.   Blood pressure (!) 161/102, pulse 73, temperature 98.4 F (36.9  C), temperature source Oral, resp. rate 14, SpO2 97 %.There is no height or weight on file to calculate BMI.  General Appearance: Casual  Eye Contact:  Good  Speech:  Clear and Coherent and Normal Rate  Volume:  Decreased  Mood:  Depressed  Affect:  Congruent and Depressed  Thought Process:  Coherent, Goal Directed, Linear and Descriptions of Associations: Intact  Orientation:  Full (Time, Place, and Person)  Thought Content:  Logical  Suicidal Thoughts:  No  Homicidal Thoughts:  No  Memory:  Immediate;   Good Recent;   Good Remote;  Fair  Judgement:  Fair  Insight:  Fair  Psychomotor Activity:  Normal  Concentration:  Concentration: Good and Attention Span: Good  Recall:  Good  Fund of Knowledge:  Good  Language:  Good  Akathisia:  No  Handed:  Right  AIMS (if indicated):   N/A  Assets:  Architect Housing Social Support  ADL's:  Intact  Cognition:  WNL  Sleep:   N/A     Demographic Factors:  Male, Low socioeconomic status and Unemployed  Loss Factors: Financial problems/change in socioeconomic status  Historical Factors: Family history of mental illness or substance abuse  Risk Reduction Factors:   Sense of responsibility to family  Continued Clinical Symptoms:  Schizophrenia:   Paranoid or undifferentiated type  Cognitive Features That Contribute To Risk:  Closed-mindedness    Suicide Risk:  Minimal: No identifiable suicidal ideation.  Patients presenting with no risk factors but with morbid ruminations; may be classified as minimal risk based on the severity of the depressive symptoms   Plan Of Care/Follow-up recommendations:  Activity:  as tolerated Diet:  heart healthy  Disposition and Treatment Plan: Substance induced mood disorder (HCC) Take all medications as prescribed. Keep all follow-up appointments as scheduled.  Do not consume alcohol or use illegal drugs while on prescription medications. Report  any adverse effects from your medications to your primary care provider promptly.  In the event of recurrent symptoms or worsening symptoms, call 911, a crisis hotline, or go to the nearest emergency department for evaluation.   Laveda Abbe, NP 11/16/2018, 10:16 AM   Patient seen face-to-face for psychiatric evaluation, chart reviewed and case discussed with the physician extender and developed treatment plan. Reviewed the information documented and agree with the treatment plan.  Juanetta Beets, DO 11/16/18 11:05 AM

## 2018-11-16 NOTE — BH Assessment (Signed)
[facility] Assessment Progress Note  Per Juanetta Beets, DO, this pt does not require psychiatric hospitalization at this time.  Pt presents under IVC initiated by [facility] staff, and upheld EDP Frederick Peers, MD, which Dr Sharma Covert has rescinded.  Pt is to be [facility] from [facility]  Pt has been eligible for ACT Team services in the past, as well as care coordination services from the [facility].  Discharge instructions provide contact information for all of these options, advising pt to contact them at his earliest opportunity to inquire about current eligibility.  Pt's nurse, Kendal Hymen, has been notified.  Doylene Canning, MA Triage Specialist [phone]

## 2018-11-16 NOTE — Discharge Instructions (Signed)
For your behavioral health needs, you are advised to follow up with an outpatient provider.  You may be eligible for ACT Team services, which would include more frequent visits with your provider, as well as in-home services.  The following providers offer ACT Team services.  Contact them at your earliest opportunity to ask about enrolling in their program:       [facility]      [address]      [place], [place] [address]      [phone]       [facility]      [address]      [place], [place] [address]      [phone]       [facility] ACT Team      [address] [place] [facility]      [address]      [place], [place]  [address]      [phone]       [facility]      [address]      [place], [place] [address]      [phone]  You have also been eligible for care coordination services from the [facility] in the past.  Contact them at the phone number below.  Please note that this number also serves as a 24 hour crisis number:       [facility]      [phone]

## 2018-11-30 ENCOUNTER — Encounter (HOSPITAL_COMMUNITY): Payer: Self-pay | Admitting: *Deleted

## 2018-11-30 ENCOUNTER — Other Ambulatory Visit: Payer: Self-pay

## 2018-11-30 ENCOUNTER — Emergency Department (HOSPITAL_COMMUNITY)
Admission: EM | Admit: 2018-11-30 | Discharge: 2018-12-01 | Disposition: A | Discharge status: Home / Self Care | Payer: Medicaid Other | Attending: Emergency Medicine | Admitting: Emergency Medicine

## 2018-11-30 DIAGNOSIS — R45851 Suicidal ideations: Secondary | ICD-10-CM | POA: Diagnosis not present

## 2018-11-30 DIAGNOSIS — Z79899 Other long term (current) drug therapy: Secondary | ICD-10-CM | POA: Diagnosis not present

## 2018-11-30 DIAGNOSIS — F17210 Nicotine dependence, cigarettes, uncomplicated: Secondary | ICD-10-CM | POA: Diagnosis not present

## 2018-11-30 DIAGNOSIS — R197 Diarrhea, unspecified: Secondary | ICD-10-CM | POA: Insufficient documentation

## 2018-11-30 DIAGNOSIS — F1020 Alcohol dependence, uncomplicated: Secondary | ICD-10-CM | POA: Diagnosis not present

## 2018-11-30 DIAGNOSIS — F489 Nonpsychotic mental disorder, unspecified: Secondary | ICD-10-CM

## 2018-11-30 DIAGNOSIS — F1910 Other psychoactive substance abuse, uncomplicated: Secondary | ICD-10-CM | POA: Diagnosis not present

## 2018-11-30 DIAGNOSIS — I10 Essential (primary) hypertension: Secondary | ICD-10-CM | POA: Diagnosis not present

## 2018-11-30 DIAGNOSIS — F1420 Cocaine dependence, uncomplicated: Secondary | ICD-10-CM | POA: Diagnosis not present

## 2018-11-30 DIAGNOSIS — R0789 Other chest pain: Secondary | ICD-10-CM | POA: Diagnosis not present

## 2018-11-30 DIAGNOSIS — F251 Schizoaffective disorder, depressive type: Secondary | ICD-10-CM | POA: Diagnosis not present

## 2018-11-30 HISTORY — DX: Other psychoactive substance abuse, uncomplicated: F19.10

## 2018-11-30 HISTORY — DX: Alcohol abuse, uncomplicated: F10.10

## 2018-11-30 LAB — RAPID URINE DRUG SCREEN, HOSP PERFORMED
Amphetamines: NOT DETECTED
Barbiturates: NOT DETECTED
Benzodiazepines: POSITIVE — AB
Cocaine: NOT DETECTED
Opiates: NOT DETECTED
Tetrahydrocannabinol: NOT DETECTED

## 2018-11-30 LAB — COMPREHENSIVE METABOLIC PANEL
ALT: 13 U/L (ref 0–44)
AST: 18 U/L (ref 15–41)
Albumin: 3.8 g/dL (ref 3.5–5.0)
Alkaline Phosphatase: 54 U/L (ref 38–126)
Anion gap: 7 (ref 5–15)
BUN: 13 mg/dL (ref 6–20)
CO2: 27 mmol/L (ref 22–32)
Calcium: 9.3 mg/dL (ref 8.9–10.3)
Chloride: 104 mmol/L (ref 98–111)
Creatinine, Ser: 1.01 mg/dL (ref 0.61–1.24)
GFR calc Af Amer: >60 mL/min (ref >60)
GFR calc non Af Amer: >60 mL/min (ref >60)
Glucose, Bld: 81 mg/dL (ref 70–99)
Potassium: 4.1 mmol/L (ref 3.5–5.1)
Sodium: 138 mmol/L (ref 135–145)
Total Bilirubin: 0.8 mg/dL (ref 0.3–1.2)
Total Protein: 8.1 g/dL (ref 6.5–8.1)

## 2018-11-30 LAB — CBC
HCT: 41.6 % (ref 39.0–52.0)
Hemoglobin: 13.1 g/dL (ref 13.0–17.0)
MCH: 29.2 pg (ref 26.0–34.0)
MCHC: 31.5 g/dL (ref 30.0–36.0)
MCV: 92.7 fL (ref 80.0–100.0)
Platelets: 221 10*3/uL (ref 150–400)
RBC: 4.49 MIL/uL (ref 4.22–5.81)
RDW: 15.2 % (ref 11.5–15.5)
WBC: 5.6 10*3/uL (ref 4.0–10.5)
nRBC: 0 % (ref 0.0–0.2)

## 2018-11-30 LAB — ACETAMINOPHEN LEVEL: Acetaminophen (Tylenol), Serum: <10 ug/mL — ABNORMAL LOW (ref 10–30)

## 2018-11-30 LAB — SALICYLATE LEVEL: Salicylate Lvl: <7 mg/dL (ref 2.8–30.0)

## 2018-11-30 LAB — ETHANOL: Alcohol, Ethyl (B): <10 mg/dL (ref <10)

## 2018-11-30 MED ORDER — MP TRI-FED COLD 2.5-60 MG PO TABS
50.00 | ORAL_TABLET | ORAL | Status: DC
Start: ? — End: 2018-11-30

## 2018-11-30 MED ORDER — BISACODYL 5 MG PO TBEC
10.00 | DELAYED_RELEASE_TABLET | ORAL | Status: DC
Start: ? — End: 2018-11-30

## 2018-11-30 MED ORDER — ZIPRASIDONE MESYLATE 20 MG IM SOLR
20.00 | INTRAMUSCULAR | Status: DC
Start: ? — End: 2018-11-30

## 2018-11-30 MED ORDER — AMLODIPINE BESYLATE 5 MG PO TABS
5.00 | ORAL_TABLET | ORAL | Status: DC
Start: 2018-12-01 — End: 2018-11-30

## 2018-11-30 MED ORDER — BENZTROPINE MESYLATE 1 MG PO TABS
1.00 | ORAL_TABLET | ORAL | Status: DC
Start: ? — End: 2018-11-30

## 2018-11-30 MED ORDER — HALOPERIDOL 5 MG PO TABS
5.00 | ORAL_TABLET | ORAL | Status: DC
Start: ? — End: 2018-11-30

## 2018-11-30 MED ORDER — GUAIFENESIN 100 MG/5ML PO SYRP
200.00 | ORAL_SOLUTION | ORAL | Status: DC
Start: ? — End: 2018-11-30

## 2018-11-30 MED ORDER — DIPHENHYDRAMINE HCL 50 MG/ML IJ SOLN
50.00 | INTRAMUSCULAR | Status: DC
Start: ? — End: 2018-11-30

## 2018-11-30 MED ORDER — SALINE NASAL SPRAY 0.65 % NA SOLN
1.00 | NASAL | Status: DC
Start: ? — End: 2018-11-30

## 2018-11-30 MED ORDER — DOCUSATE SODIUM 100 MG PO CAPS
100.00 | ORAL_CAPSULE | ORAL | Status: DC
Start: ? — End: 2018-11-30

## 2018-11-30 MED ORDER — GENERIC EXTERNAL MEDICATION
5.00 | Status: DC
Start: ? — End: 2018-11-30

## 2018-11-30 MED ORDER — BENZOCAINE-MENTHOL 6-10 MG MT LOZG
1.00 | LOZENGE | OROMUCOSAL | Status: DC
Start: ? — End: 2018-11-30

## 2018-11-30 MED ORDER — NICOTINE POLACRILEX 2 MG MT GUM
2.00 | CHEWING_GUM | OROMUCOSAL | Status: DC
Start: ? — End: 2018-11-30

## 2018-11-30 MED ORDER — NUTRINATE PO CHEW
1.00 | CHEWABLE_TABLET | ORAL | Status: DC
Start: ? — End: 2018-11-30

## 2018-11-30 MED ORDER — LORAZEPAM 2 MG/ML IJ SOLN
2.00 | INTRAMUSCULAR | Status: DC
Start: ? — End: 2018-11-30

## 2018-11-30 MED ORDER — TRAZODONE HCL 100 MG PO TABS
100.00 | ORAL_TABLET | ORAL | Status: DC
Start: ? — End: 2018-11-30

## 2018-11-30 MED ORDER — NICOTINE 14 MG/24HR TD PT24
1.00 | MEDICATED_PATCH | TRANSDERMAL | Status: DC
Start: 2018-12-01 — End: 2018-11-30

## 2018-11-30 MED ORDER — PROMETHAZINE HCL (BULK CHEMICALS - P'S)
50.00 | Status: DC
Start: ? — End: 2018-11-30

## 2018-11-30 MED ORDER — CLONIDINE HCL 0.1 MG PO TABS
0.10 | ORAL_TABLET | ORAL | Status: DC
Start: ? — End: 2018-11-30

## 2018-11-30 MED ORDER — GENERIC EXTERNAL MEDICATION
100.00 | Status: DC
Start: 2018-12-01 — End: 2018-11-30

## 2018-11-30 MED ORDER — ARIPIPRAZOLE 5 MG PO TABS
5.00 | ORAL_TABLET | ORAL | Status: DC
Start: 2018-12-01 — End: 2018-11-30

## 2018-11-30 MED ORDER — PROMETHAZINE HCL 25 MG PO TABS
25.00 | ORAL_TABLET | ORAL | Status: DC
Start: ? — End: 2018-11-30

## 2018-11-30 MED ORDER — ACETAMINOPHEN 325 MG PO TABS
650.00 | ORAL_TABLET | ORAL | Status: DC
Start: ? — End: 2018-11-30

## 2018-11-30 NOTE — ED Notes (Signed)
Bed: [facility] Expected date:  Expected time:  Means of arrival:  Comments: Tri 5

## 2018-11-30 NOTE — ED Triage Notes (Signed)
Pt requesting drug detox from multiple drugs, feels SI with plan to overdose. Last use of drugs 4 days, Etoh this morning

## 2018-11-30 NOTE — ED Notes (Signed)
Randa Evens RN in [employer] updated on pt, He will be wanded and go to room 37

## 2018-11-30 NOTE — BH Assessment (Addendum)
Tele Assessment Note   Patient Name: Bruce [name] MRN: [id number] Referring Physician: Adela Lank Location of Patient: [facility]-EMERGENCY DEPT Location of Provider: Behavioral Health TTS Department  Bruce [name] is an 43 y.o. male.  The pt came in due to having suicidal thoughts, HI and wanting to get off of crack and alcohol.  The pt stated he last used crack and alcohol earlier today.  The pt reported he plans to kill himself by overdosing on some pills he has in his bag.  The pt stated he last overdosed prior to going to [facility], but there isn't any mention of him overdosing in the notes from [facility].  The pt was living in a shelter, but was kicked out by Catering manager.  The pt stated he wants to kill that person with a gun his friend has.  When asked about his stressors, he stated he "feels the walls are closing in" on him.  The pt was released from [facility] earlier today and he was supposed to follow up with an ACTT.  The pt stated he couldn't see them today and his appointment isn't for another 2 weeks.  The appointment in 2 weeks will be the pt's initial appointment.  The pt reports seeing the walls move and feeling like people are touching him.    The pt is currently homeless and lives in a tent in [place].  He has a history of cutting.  He denies legal issues, and history of abuse.  The pt stated he is sleeping 2-3 hours a day and has a good appetite.  He reports feeling depressed, feeling bad about himself, and feeling more irritable.  The pt stated he last used crack and had alcohol today.  Pt is dressed in scrubs. He is alert and oriented x4. Pt speaks in a clear tone, at moderate volume and normal pace. Eye contact is good. Pt's mood is depressed. Thought process is coherent and relevant. There is no indication Pt is currently responding to internal stimuli or experiencing delusional thought content.?Pt was cooperative throughout assessment.    Diagnosis:   F25.1 Schizoaffective disorder, Depressive type F14.20 Cocaine use disorder, Severe F10.20 Alcohol use disorder, Severe  Past Medical History:  Past Medical History:  Diagnosis Date  . Depression   . ETOH abuse   . Hypertension   . Obesity   . Schizophrenia (HCC)   . Substance abuse (HCC)     History reviewed. No pertinent surgical history.  Family History:  Family History  Problem Relation Age of Onset  . Schizophrenia Mother     Social History:  reports that he has been smoking cigarettes. He has been smoking about 0.50 packs per day. He has never used smokeless tobacco. He reports current alcohol use. He reports current drug use. Drugs: Marijuana, "Crack" cocaine, and Cocaine.  Additional Social History:  Alcohol / Drug Use Pain Medications: See MAR Prescriptions: See MAR Over the Counter: See MAR History of alcohol / drug use?: Yes Longest period of sobriety (when/how long): 2 weeks Substance #1 Name of Substance 1: crack cocaine 1 - Age of First Use: 19 1 - Amount (size/oz): 10-20 dollars worth 1 - Frequency: daily 1 - Last Use / Amount: 11/30/2018 Substance #2 Name of Substance 2: alcohol 2 - Age of First Use: 19 2 - Amount (size/oz): case of beer 2 - Frequency: daily 2 - Last Use / Amount: 11/30/2018  CIWA: CIWA-Ar BP: 139/73 Pulse Rate: 90 COWS:  Allergies: No Known Allergies  Home Medications: (Not in a hospital admission)   OB/GYN Status:  No LMP for male patient.  General Assessment Data Location of Assessment: [facility] ED TTS Assessment: In system Is this a Tele or Face-to-Face Assessment?: Face-to-Face Is this an Initial Assessment or a Re-assessment for this encounter?: Initial Assessment Patient Accompanied by:: N/A Language Other than English: No Living Arrangements: Homeless/Shelter What gender do you identify as?: Male Marital status: Single Living Arrangements: Alone Can pt return to current living arrangement?: Yes Admission Status:  Voluntary Is patient capable of signing voluntary admission?: Yes Referral Source: Self/Family/Friend Insurance type: Medicaid     Crisis Care Plan Living Arrangements: Alone Legal Guardian: Other:(Self) Name of Psychiatrist: None Name of Therapist: None  Education Status Is patient currently in school?: No Is the patient employed, unemployed or receiving disability?: Receiving disability income  Risk to self with the past 6 months Suicidal Ideation: Yes-Currently Present Has patient been a risk to self within the past 6 months prior to admission? : Yes Suicidal Intent: Yes-Currently Present Has patient had any suicidal intent within the past 6 months prior to admission? : Yes Is patient at risk for suicide?: Yes Suicidal Plan?: Yes-Currently Present Has patient had any suicidal plan within the past 6 months prior to admission? : Yes Specify Current Suicidal Plan: overdose on pills Access to Means: Yes Specify Access to Suicidal Means: said has pills in his bag What has been your use of drugs/alcohol within the last 12 months?: crack and alcohol Previous Attempts/Gestures: Yes How many times?: (multiple) Other Self Harm Risks: cutting Triggers for Past Attempts: Unknown Intentional Self Injurious Behavior: Cutting Comment - Self Injurious Behavior: cutting Family Suicide History: Yes Recent stressful life event(s): Other (Comment)("feels liek wals are closing in") Persecutory voices/beliefs?: Yes Depression: Yes Depression Symptoms: Insomnia, Despondent Substance abuse history and/or treatment for substance abuse?: Yes Suicide prevention information given to non-admitted patients: Yes  Risk to Others within the past 6 months Homicidal Ideation: Yes-Currently Present Does patient have any lifetime risk of violence toward others beyond the six months prior to admission? : No Thoughts of Harm to Others: Yes-Currently Present Comment - Thoughts of Harm to Others: thoughts  to shoot people  Current Homicidal Intent: No Current Homicidal Plan: Yes-Currently Present(to shoot someone) Describe Current Homicidal Plan: to shoot someone Access to Homicidal Means: Yes Describe Access to Homicidal Means: said friend has a gun Identified Victim: person who he used to live with History of harm to others?: No Assessment of Violence: None Noted Violent Behavior Description: none Does patient have access to weapons?: No Criminal Charges Pending?: No Does patient have a court date: No Is patient on probation?: No  Psychosis Hallucinations: Tactile, Visual Delusions: None noted  Mental Status Report Appearance/Hygiene: In scrubs, Unremarkable Eye Contact: Good Motor Activity: Unable to assess Speech: Logical/coherent Level of Consciousness: Alert Mood: Depressed Affect: Depressed Anxiety Level: None Thought Processes: Coherent, Relevant Judgement: Impaired Orientation: Person, Place, Time, Situation Obsessive Compulsive Thoughts/Behaviors: None  Cognitive Functioning Concentration: Normal Memory: Recent Intact, Remote Intact Is patient IDD: No Insight: Poor Impulse Control: Poor Appetite: Good Have you had any weight changes? : No Change Sleep: Decreased Total Hours of Sleep: 5 Vegetative Symptoms: None  ADLScreening [facility] Assessment Services) Patient's cognitive ability adequate to safely complete daily activities?: Yes Patient able to express need for assistance with ADLs?: Yes Independently performs ADLs?: Yes (appropriate for developmental age)  Prior Inpatient Therapy Prior Inpatient Therapy: Yes Prior Therapy Dates: 11/29/2018,  05/2018, and multiple other times Prior Therapy Facilty/Provider(s): [place] Reason for Treatment: psychois and SA  Prior Outpatient Therapy Prior Outpatient Therapy: Yes Prior Therapy Dates: 10/2018 Prior Therapy Facilty/Provider(s): [facility] Reason for Treatment: psychosis and SA Does patient  have an ACCT team?: No Does patient have Intensive In-House Services?  : No Does patient have [facility] services? : Yes Does patient have P4CC services?: No  ADL Screening (condition at time of admission) Patient's cognitive ability adequate to safely complete daily activities?: Yes Patient able to express need for assistance with ADLs?: Yes Independently performs ADLs?: Yes (appropriate for developmental age)       Abuse/Neglect Assessment (Assessment to be complete while patient is alone) Abuse/Neglect Assessment Can Be Completed: Yes Physical Abuse: Denies Verbal Abuse: Yes, past (Comment) Sexual Abuse: Denies Exploitation of patient/patient's resources: Denies Self-Neglect: Denies Values / Beliefs Cultural Requests During Hospitalization: None Spiritual Requests During Hospitalization: None Consults Spiritual Care Consult Needed: No Social Work Consult Needed: No Merchant navy officerAdvance Directives (For Healthcare) Does Patient Have a Medical Advance Directive?: No Would patient like information on creating a medical advance directive?: No - Patient declined          Disposition:  Disposition Initial Assessment Completed for this Encounter: Yes   PA Donell SievertSpencer Simon recommends the pt is psych cleared.  MD Adela LankFloyd made aware of the recommendation.  This service was provided via telemedicine using a 2-way, interactive audio and video technology.  Names of all persons participating in this telemedicine service and their role in this encounter. Name: Arlan OrganDwight Gu Role: Pt  Name:  Role:   Name:  Role:   Name:  Role:     Ottis StainGarvin, Broxton Broady Jermaine 11/30/2018 8:15 PM

## 2018-11-30 NOTE — ED Notes (Signed)
TTS assessment in progress via tele psych. 

## 2018-11-30 NOTE — ED Notes (Signed)
Holding labs until medical screening and TTS completed.

## 2018-11-30 NOTE — Discharge Instructions (Signed)
Follow-up with your mental health provider °

## 2018-11-30 NOTE — ED Provider Notes (Signed)
[facility]-EMERGENCY DEPT Provider Note   CSN: [id number] Arrival date & time: 11/30/18  1907    History   Chief Complaint Chief Complaint  Patient presents with  . Detox Drugs  . SI w/ Plan    HPI Bruce [name] is a 43 y.o. male.     43 yo M with a chief complaint of suicidal ideation and drug abuse.  Patient states that he was recently hospitalized in a psychiatric hospital for suicidal and homicidal ideation.  He feels like they let him go early and as a result he went immediately back to drinking illegal drugs.  He denies any medical complaint denies chest pain shortness of breath abdominal pain vomiting or diarrhea.  Denies cough congestion fever chills or myalgias.  He thinks he needs to go back into a psychiatric facility.  The history is provided by the patient.  Illness  Severity:  Mild Onset quality:  Gradual Duration:  2 days Timing:  Constant Progression:  Worsening Associated symptoms: no abdominal pain, no chest pain, no congestion, no diarrhea, no fever, no headaches, no myalgias, no rash, no shortness of breath and no vomiting     Past Medical History:  Diagnosis Date  . Depression   . ETOH abuse   . Hypertension   . Obesity   . Schizophrenia (HCC)   . Substance abuse [facility])     Patient Active Problem List   Diagnosis Date Noted  . Malingering 06/12/2018  . Tobacco use disorder 06/09/2018  . HTN (hypertension) 06/09/2018  . Schizoaffective disorder, depressive type (HCC) 08/04/2017  . 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 Polysubstance abuse (HCC) 02/27/2017  . Substance induced mood disorder (HCC) 02/27/2017  . Cocaine use disorder, severe, dependence (HCC) 01/05/2017  . 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 Cocaine use disorder, mild, abuse (HCC) 05/03/2016  . Cannabis use disorder, mild, abuse 05/03/2016  . Suicidal ideations 04/30/2016  . Hyperprolactinemia (HCC) 09/24/2015  . Alcohol use  disorder, moderate, dependence (HCC) 09/22/2015  . Morbid obesity (HCC) 09/22/2015    History reviewed. No pertinent surgical history.      Home Medications    Prior to Admission medications   Medication Sig Start Date End Date Taking? Authorizing Provider  acetaminophen (TYLENOL) 325 MG tablet Take 650 mg by mouth every 6 (six) hours as needed for headache or fever. 11/13/18   [provider]  amLODipine (NORVASC) 5 MG tablet Take 1 tablet (5 mg total) by mouth daily. For high blood pressure 06/15/18 06/15/19  McNew, Ileene Hutchinson, MD  ARIPiprazole (ABILIFY) 5 MG tablet Take 5 mg by mouth daily. For 7 days 07/13/18   [provider]  benzonatate (TESSALON) 100 MG capsule Take 100 mg by mouth 3 (three) times daily as needed for cough. 11/13/18   [provider]  benztropine (COGENTIN) 1 MG tablet Take 1 tablet (1 mg total) by mouth at bedtime. For prevention of drug induced trmors 06/15/18   McNew, Ileene Hutchinson, MD  doxycycline (VIBRA-TABS) 100 MG tablet Take 100 mg by mouth 2 (two) times daily. 09/24/18   [provider]  GUAIATUSSIN AC 100-10 MG/5ML syrup Take by mouth daily as needed. Take as directed 11/13/18   [provider]  haloperidol (HALDOL) 10 MG tablet Take 1 tablet (10 mg total) by mouth at bedtime. 06/15/18   McNew, Ileene Hutchinson, MD  traZODone (DESYREL) 50 MG tablet Take 1 tablet (50 mg total) by mouth at bedtime. 06/15/18   McNew, [employer]  R, MD    Family History Family History  Problem Relation Age of Onset  . Schizophrenia Mother     Social History Social History   Tobacco Use  . Smoking status: Current Every Day Smoker    Packs/day: 0.50    Types: Cigarettes  . Smokeless tobacco: Never Used  Substance Use Topics  . Alcohol use: Yes  . Drug use: Yes    Types: Marijuana, "Crack" cocaine, Cocaine     Allergies   Patient has no known allergies.   Review of Systems Review of Systems  Constitutional: Negative for chills and fever.   HENT: Negative for congestion and facial swelling.   Eyes: Negative for discharge and visual disturbance.  Respiratory: Negative for shortness of breath.   Cardiovascular: Negative for chest pain and palpitations.  Gastrointestinal: Negative for abdominal pain, diarrhea and vomiting.  Musculoskeletal: Negative for arthralgias and myalgias.  Skin: Negative for color change and rash.  Neurological: Negative for tremors, syncope and headaches.  Psychiatric/Behavioral: Positive for suicidal ideas. Negative for confusion and dysphoric mood.     Physical Exam Updated Vital Signs BP 139/73 (BP Location: Right Arm)   Pulse 90   Temp 99.1 F (37.3 C) (Oral)   Resp 20   Ht 5\' 8"  (1.727 m)   Wt (!) 181.4 kg   SpO2 99%   BMI 60.82 kg/m   Physical Exam Vitals signs and nursing note reviewed.  Constitutional:      Appearance: He is well-developed. He is obese.  HENT:     Head: Normocephalic and atraumatic.  Eyes:     Pupils: Pupils are equal, round, and reactive to light.  Neck:     Musculoskeletal: Normal range of motion and neck supple.     Vascular: No JVD.  Cardiovascular:     Rate and Rhythm: Normal rate and regular rhythm.     Heart sounds: No murmur. No friction rub. No gallop.   Pulmonary:     Effort: No respiratory distress.     Breath sounds: No wheezing.  Abdominal:     General: There is no distension.     Tenderness: There is no guarding or rebound.  Musculoskeletal: Normal range of motion.     Right lower leg: Edema (1+ bilaterally) present.     Left lower leg: Edema present.  Skin:    Coloration: Skin is not pale.     Findings: No rash.  Neurological:     Mental Status: He is alert and oriented to person, place, and time.  Psychiatric:        Behavior: Behavior normal.      ED Treatments / Results  Labs (all labs ordered are listed, but only abnormal results are displayed) Labs Reviewed  ACETAMINOPHEN LEVEL - Abnormal; Notable for the following  components:      Result Value   Acetaminophen (Tylenol), Serum <10 (*)    All other components within normal limits  RAPID URINE DRUG SCREEN, HOSP PERFORMED - Abnormal; Notable for the following components:   Benzodiazepines POSITIVE (*)    All other components within normal limits  COMPREHENSIVE METABOLIC PANEL  ETHANOL  SALICYLATE LEVEL  CBC    EKG None  Radiology No results found.  Procedures Procedures (including critical care time)  Medications Ordered in ED Medications - No data to display   Initial Impression / Assessment and Plan / ED Course  I have reviewed the triage vital signs and the nursing notes.  Pertinent labs & imaging results  that were available during my care of the patient were reviewed by me and considered in my medical decision making (see chart for details).        43 yo M with a chief complaint of suicidal ideation and request to detox from alcohol and illegal drugs.  Patient was just discharged couple days ago from psychiatric hospitalization, he thinks he left too early and since then has been binging on alcohol and illegal drugs.  He is still suicidal and has thoughts that he may be overdosed on medications.  He has no medical complaint.  I feel he is medically clear for TTS evaluation.  Psych needs labs for clearance.  Psych cleared post labwork.    10:11 PM:  I have discussed the diagnosis/risks/treatment options with the patient and family and believe the pt to be eligible for discharge home to follow-up with PCP. We also discussed returning to the ED immediately if new or worsening sx occur. We discussed the sx which are most concerning (e.g., sudden worsening pain, fever, inability to tolerate by mouth) that necessitate immediate return. Medications administered to the patient during their visit and any new prescriptions provided to the patient are listed below.  Medications given during this visit Medications - No data to display   The  patient appears reasonably screen and/or stabilized for discharge and I doubt any other medical condition or other [facility] requiring further screening, evaluation, or treatment in the ED at this time prior to discharge.    Final Clinical Impressions(s) / ED Diagnoses   Final diagnoses:  Mental health problem    ED Discharge Orders    None       Melene Plan, DO 11/30/18 2211

## 2018-11-30 NOTE — Progress Notes (Signed)
Received Bruce [name] from triage, alert, depressed, anxious with passive SI. He verbalized the situation with his mother did not work out last week. He refused to go into details. Later he received his discharge order, the AVS was reviewed with him and his follow-up plans. He  was escorted to the front door without incident; with the possibility of assistance to the [facility].

## 2018-11-30 NOTE — ED Notes (Signed)
TTS complete 

## 2018-12-01 ENCOUNTER — Encounter (HOSPITAL_COMMUNITY): Payer: Self-pay | Admitting: Emergency Medicine

## 2018-12-01 ENCOUNTER — Other Ambulatory Visit: Payer: Self-pay

## 2018-12-01 ENCOUNTER — Emergency Department (HOSPITAL_COMMUNITY)
Admission: EM | Admit: 2018-12-01 | Discharge: 2018-12-01 | Disposition: A | Discharge status: Home / Self Care | Payer: Medicaid Other | Source: Home / Self Care | Attending: Emergency Medicine | Admitting: Emergency Medicine

## 2018-12-01 ENCOUNTER — Emergency Department (HOSPITAL_COMMUNITY): Payer: Medicaid Other

## 2018-12-01 DIAGNOSIS — R197 Diarrhea, unspecified: Secondary | ICD-10-CM

## 2018-12-01 DIAGNOSIS — I10 Essential (primary) hypertension: Secondary | ICD-10-CM | POA: Insufficient documentation

## 2018-12-01 DIAGNOSIS — Z79899 Other long term (current) drug therapy: Secondary | ICD-10-CM | POA: Insufficient documentation

## 2018-12-01 DIAGNOSIS — R0789 Other chest pain: Secondary | ICD-10-CM

## 2018-12-01 DIAGNOSIS — F17210 Nicotine dependence, cigarettes, uncomplicated: Secondary | ICD-10-CM | POA: Insufficient documentation

## 2018-12-01 LAB — BASIC METABOLIC PANEL
Anion gap: 11 (ref 5–15)
BUN: 12 mg/dL (ref 6–20)
CO2: 27 mmol/L (ref 22–32)
Calcium: 9.8 mg/dL (ref 8.9–10.3)
Chloride: 100 mmol/L (ref 98–111)
Creatinine, Ser: 1.15 mg/dL (ref 0.61–1.24)
GFR calc Af Amer: >60 mL/min (ref >60)
GFR calc non Af Amer: >60 mL/min (ref >60)
Glucose, Bld: 87 mg/dL (ref 70–99)
Potassium: 4 mmol/L (ref 3.5–5.1)
Sodium: 138 mmol/L (ref 135–145)

## 2018-12-01 LAB — HEPATIC FUNCTION PANEL
ALT: 15 U/L (ref 0–44)
AST: 18 U/L (ref 15–41)
Albumin: 3.6 g/dL (ref 3.5–5.0)
Alkaline Phosphatase: 56 U/L (ref 38–126)
Bilirubin, Direct: 0.1 mg/dL (ref 0.0–0.2)
Indirect Bilirubin: 0.8 mg/dL (ref 0.3–0.9)
Total Bilirubin: 0.9 mg/dL (ref 0.3–1.2)
Total Protein: 7.8 g/dL (ref 6.5–8.1)

## 2018-12-01 LAB — CBC
HCT: 41.7 % (ref 39.0–52.0)
Hemoglobin: 13.1 g/dL (ref 13.0–17.0)
MCH: 28.8 pg (ref 26.0–34.0)
MCHC: 31.4 g/dL (ref 30.0–36.0)
MCV: 91.6 fL (ref 80.0–100.0)
Platelets: 233 10*3/uL (ref 150–400)
RBC: 4.55 MIL/uL (ref 4.22–5.81)
RDW: 15 % (ref 11.5–15.5)
WBC: 5.7 10*3/uL (ref 4.0–10.5)
nRBC: 0 % (ref 0.0–0.2)

## 2018-12-01 LAB — LIPASE, BLOOD: Lipase: 31 U/L (ref 11–51)

## 2018-12-01 LAB — TROPONIN I
Troponin I: <0.03 ng/mL (ref <0.03)
Troponin I: <0.03 ng/mL (ref <0.03)

## 2018-12-01 MED ORDER — SODIUM CHLORIDE 0.9% FLUSH: 3.0000 mL | Freq: Once | INTRAVENOUS | Status: DC

## 2018-12-01 NOTE — ED Triage Notes (Signed)
Pt brought to ED by [facility] for c/o cp. Pt was dc from [facility] and sent to the bus stop when pt started to have some CP. Pt got 324 mg ASA and 3 Nitro sl pta to Ed. Pt is AO x 4 NAD noticed. VS 120/72, HR 80, R 18, SPO2 100%.

## 2018-12-01 NOTE — ED Provider Notes (Signed)
[facility] EMERGENCY DEPARTMENT Provider Note   CSN: [id number] Arrival date & time: 12/01/18  0300    History   Chief Complaint Chief Complaint  Patient presents with  . Chest Pain    HPI Bruce [name] is a 43 y.o. male.     Patient presents to the emergency department for evaluation of chest pain.  Patient was just discharged from [facility] after treatment for substance abuse and suicidal ideation.  He [place] the bus stop when he developed chest pain.  Patient reports the pain in the center of his chest that does not radiate.  No shortness of breath.  No nausea, diaphoresis.  He does have abdominal discomfort which has been present for several days.  This has been accompanied by diarrhea.  No rectal bleeding, melena.     Past Medical History:  Diagnosis Date  . Depression   . ETOH abuse   . Hypertension   . Obesity   . Schizophrenia (HCC)   . Substance abuse [facility])     Patient Active Problem List   Diagnosis Date Noted  . Malingering 06/12/2018  . Tobacco use disorder 06/09/2018  . HTN (hypertension) 06/09/2018  . Schizoaffective disorder, depressive type (HCC) 08/04/2017  . 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 Polysubstance abuse (HCC) 02/27/2017  . Substance induced mood disorder (HCC) 02/27/2017  . Cocaine use disorder, severe, dependence (HCC) 01/05/2017  . 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 Cocaine use disorder, mild, abuse (HCC) 05/03/2016  . Cannabis use disorder, mild, abuse 05/03/2016  . Suicidal ideations 04/30/2016  . Hyperprolactinemia (HCC) 09/24/2015  . Alcohol use disorder, moderate, dependence (HCC) 09/22/2015  . Morbid obesity (HCC) 09/22/2015    History reviewed. No pertinent surgical history.      Home Medications    Prior to Admission medications   Medication Sig Start Date End Date Taking? Authorizing Provider  acetaminophen (TYLENOL) 325 MG tablet Take 650 mg by  mouth every 6 (six) hours as needed for headache or fever. 11/13/18   [provider]  amLODipine (NORVASC) 5 MG tablet Take 1 tablet (5 mg total) by mouth daily. For high blood pressure 06/15/18 06/15/19  McNew, Ileene Hutchinson, MD  ARIPiprazole (ABILIFY) 5 MG tablet Take 5 mg by mouth daily. For 7 days 07/13/18   [provider]  benzonatate (TESSALON) 100 MG capsule Take 100 mg by mouth 3 (three) times daily as needed for cough. 11/13/18   [provider]  benztropine (COGENTIN) 1 MG tablet Take 1 tablet (1 mg total) by mouth at bedtime. For prevention of drug induced trmors 06/15/18   McNew, Ileene Hutchinson, MD  doxycycline (VIBRA-TABS) 100 MG tablet Take 100 mg by mouth 2 (two) times daily. 09/24/18   [provider]  GUAIATUSSIN AC 100-10 MG/5ML syrup Take by mouth daily as needed. Take as directed 11/13/18   [provider]  haloperidol (HALDOL) 10 MG tablet Take 1 tablet (10 mg total) by mouth at bedtime. 06/15/18   McNew, Ileene Hutchinson, MD  traZODone (DESYREL) 50 MG tablet Take 1 tablet (50 mg total) by mouth at bedtime. 06/15/18   McNew, Ileene Hutchinson, MD    Family History Family History  Problem Relation Age of Onset  . Schizophrenia Mother     Social History Social History   Tobacco Use  . Smoking status: Current Every Day Smoker    Packs/day: 0.50    Types: Cigarettes  . Smokeless tobacco: Never Used  Substance Use  Topics  . Alcohol use: Yes  . Drug use: Yes    Types: Marijuana, "Crack" cocaine, Cocaine     Allergies   Patient has no known allergies.   Review of Systems Review of Systems  Cardiovascular: Positive for chest pain.  Gastrointestinal: Positive for abdominal pain and diarrhea.  All other systems reviewed and are negative.    Physical Exam Updated Vital Signs BP 122/75 (BP Location: Right Arm)   Pulse 68   Temp 98.2 F (36.8 C) (Oral)   Resp 18   SpO2 100%   Physical Exam Vitals signs and nursing note reviewed.   Constitutional:      General: He is not in acute distress.    Appearance: Normal appearance. He is well-developed. He is obese.  HENT:     Head: Normocephalic and atraumatic.     Right Ear: Hearing normal.     Left Ear: Hearing normal.     Nose: Nose normal.  Eyes:     Conjunctiva/sclera: Conjunctivae normal.     Pupils: Pupils are equal, round, and reactive to light.  Neck:     Musculoskeletal: Normal range of motion and neck supple.  Cardiovascular:     Rate and Rhythm: Regular rhythm.     Heart sounds: S1 normal and S2 normal. No murmur. No friction rub. No gallop.   Pulmonary:     Effort: Pulmonary effort is normal. No respiratory distress.     Breath sounds: Normal breath sounds.  Chest:     Chest wall: Tenderness present.    Abdominal:     General: Bowel sounds are normal.     Palpations: Abdomen is soft.     Tenderness: There is generalized abdominal tenderness (minimal, generalized). There is no guarding or rebound. Negative signs include Murphy's sign and McBurney's sign.     Hernia: No hernia is present.  Musculoskeletal: Normal range of motion.  Skin:    General: Skin is warm and dry.     Findings: No rash.  Neurological:     Mental Status: He is alert and oriented to person, place, and time.     GCS: GCS eye subscore is 4. GCS verbal subscore is 5. GCS motor subscore is 6.     Cranial Nerves: No cranial nerve deficit.     Sensory: No sensory deficit.     Coordination: Coordination normal.  Psychiatric:        Speech: Speech normal.        Behavior: Behavior normal.        Thought Content: Thought content normal.      ED Treatments / Results  Labs (all labs ordered are listed, but only abnormal results are displayed) Labs Reviewed  BASIC METABOLIC PANEL  CBC  TROPONIN I  HEPATIC FUNCTION PANEL  LIPASE, BLOOD    EKG None   Date: 12/01/2018  Rate: 69  Rhythm: normal sinus rhythm  QRS Axis: normal  Intervals: normal  ST/T Wave abnormalities:  normal  Conduction Disutrbances: none  Narrative Interpretation: unremarkable      Radiology Dg Chest 2 View  Result Date: 12/01/2018 CLINICAL DATA:  Chest pain and shortness of breath. EXAM: CHEST - 2 VIEW COMPARISON:  Radiographs 06/23/2018 FINDINGS: The cardiomediastinal contours are normal. The lungs are clear. Pulmonary vasculature is normal. No consolidation, pleural effusion, or pneumothorax. No acute osseous abnormalities are seen. IMPRESSION: No acute chest findings. Electronically Signed   By: Narda RutherfordMelanie  Sanford M.D.   On: 12/01/2018 03:45    Procedures  Procedures (including critical care time)  Medications Ordered in ED Medications  sodium chloride flush (NS) 0.9 % injection 3 mL (3 mLs Intravenous Not Given 12/01/18 0310)     Initial Impression / Assessment and Plan / ED Course  I have reviewed the triage vital signs and the nursing notes.  Pertinent labs & imaging results that were available during my care of the patient were reviewed by me and considered in my medical decision making (see chart for details).       Patient presents to the emergency department with complaints of chest pain.  He reports that he was at the bus stop after being discharged from behavioral health tonight when the pain began.  Pain is atypical, does not radiate.  No associated nausea, diaphoresis, shortness of breath.  Examination reveals significant reproducibility of the tenderness in the central chest.  This is not felt to be consistent with cardiac etiology.  Patient also complaining of abdominal discomfort that has been present for several days.  He has had associated diarrhea.  Abdominal exam today, however, is completely benign.  He has very slight autolyzed tenderness, no guarding, no rebound.  Blood work is normal.  At this point patient has been adequately screened.  His chest pain appears to be musculoskeletal in nature, does not require any further work-up for the abdominal  complaints.  Will treat symptomatically.  Final Clinical Impressions(s) / ED Diagnoses   Final diagnoses:  Chest wall pain  Diarrhea, unspecified type    ED Discharge Orders    None       Gilda Crease, MD 12/01/18 [phone]

## 2019-01-08 ENCOUNTER — Encounter (HOSPITAL_COMMUNITY): Payer: Self-pay

## 2019-01-08 ENCOUNTER — Other Ambulatory Visit: Payer: Self-pay

## 2019-01-08 ENCOUNTER — Emergency Department (HOSPITAL_COMMUNITY)
Admission: EM | Admit: 2019-01-08 | Discharge: 2019-01-09 | Disposition: A | Discharge status: Home / Self Care | Payer: Medicaid Other | Attending: Emergency Medicine | Admitting: Emergency Medicine

## 2019-01-08 DIAGNOSIS — I10 Essential (primary) hypertension: Secondary | ICD-10-CM | POA: Diagnosis not present

## 2019-01-08 DIAGNOSIS — F209 Schizophrenia, unspecified: Secondary | ICD-10-CM | POA: Insufficient documentation

## 2019-01-08 DIAGNOSIS — F17210 Nicotine dependence, cigarettes, uncomplicated: Secondary | ICD-10-CM | POA: Diagnosis not present

## 2019-01-08 DIAGNOSIS — F1210 Cannabis abuse, uncomplicated: Secondary | ICD-10-CM | POA: Insufficient documentation

## 2019-01-08 DIAGNOSIS — Z79899 Other long term (current) drug therapy: Secondary | ICD-10-CM | POA: Insufficient documentation

## 2019-01-08 DIAGNOSIS — F1410 Cocaine abuse, uncomplicated: Secondary | ICD-10-CM | POA: Diagnosis not present

## 2019-01-08 DIAGNOSIS — Z008 Encounter for other general examination: Secondary | ICD-10-CM | POA: Diagnosis present

## 2019-01-08 DIAGNOSIS — F1910 Other psychoactive substance abuse, uncomplicated: Secondary | ICD-10-CM | POA: Diagnosis not present

## 2019-01-08 LAB — RAPID URINE DRUG SCREEN, HOSP PERFORMED
Amphetamines: NOT DETECTED
Barbiturates: NOT DETECTED
Benzodiazepines: NOT DETECTED
Cocaine: POSITIVE — AB
Opiates: NOT DETECTED
Tetrahydrocannabinol: NOT DETECTED

## 2019-01-08 LAB — CBC
HCT: 40.2 % (ref 39.0–52.0)
Hemoglobin: 12.6 g/dL — ABNORMAL LOW (ref 13.0–17.0)
MCH: 29.1 pg (ref 26.0–34.0)
MCHC: 31.3 g/dL (ref 30.0–36.0)
MCV: 92.8 fL (ref 80.0–100.0)
Platelets: 234 10*3/uL (ref 150–400)
RBC: 4.33 MIL/uL (ref 4.22–5.81)
RDW: 14.9 % (ref 11.5–15.5)
WBC: 7.2 10*3/uL (ref 4.0–10.5)
nRBC: 0 % (ref 0.0–0.2)

## 2019-01-08 LAB — COMPREHENSIVE METABOLIC PANEL
ALT: 17 U/L (ref 0–44)
AST: 17 U/L (ref 15–41)
Albumin: 3.4 g/dL — ABNORMAL LOW (ref 3.5–5.0)
Alkaline Phosphatase: 62 U/L (ref 38–126)
Anion gap: 10 (ref 5–15)
BUN: 14 mg/dL (ref 6–20)
CO2: 28 mmol/L (ref 22–32)
Calcium: 9.6 mg/dL (ref 8.9–10.3)
Chloride: 100 mmol/L (ref 98–111)
Creatinine, Ser: 1.14 mg/dL (ref 0.61–1.24)
GFR calc Af Amer: >60 mL/min (ref >60)
GFR calc non Af Amer: >60 mL/min (ref >60)
Glucose, Bld: 91 mg/dL (ref 70–99)
Potassium: 4.1 mmol/L (ref 3.5–5.1)
Sodium: 138 mmol/L (ref 135–145)
Total Bilirubin: 1 mg/dL (ref 0.3–1.2)
Total Protein: 8 g/dL (ref 6.5–8.1)

## 2019-01-08 LAB — ETHANOL: Alcohol, Ethyl (B): <10 mg/dL (ref <10)

## 2019-01-08 NOTE — ED Provider Notes (Signed)
[facility] Provider Note   CSN: [id number] Arrival date & time: 01/08/19  2011    History   Chief Complaint Chief Complaint  Patient presents with  . Addiction Problem    HPI Bruce [name] is a 43 y.o. male.     Patient with history of schizophrenia, alcohol abuse, polysubstance abuse, malingering -- presents the emergency department requesting help for substance abuse problems.  States that he spent $1200 worth of a stimulus check on crack.  He states that he does way too much crack.  He also uses benzodiazepines and marijuana.  He drinks alcohol regularly.  [facility].  Patient currently denies any suicidal or homicidal ideations.  He denies any hallucinations.  He denies recent medical illness.  He has a chronic cough that is unchanged.  No recent fevers, shortness of breath, nausea or vomiting.  No recent seizures.  Onset of symptoms chronic.     Past Medical History:  Diagnosis Date  . Depression   . ETOH abuse   . Hypertension   . Obesity   . Schizophrenia (HCC)   . Substance abuse [facility](HCC)     Patient Active Problem List   Diagnosis Date Noted  . Malingering 06/12/2018  . Tobacco use disorder 06/09/2018  . HTN (hypertension) 06/09/2018  . Schizoaffective disorder, depressive type (HCC) 08/04/2017  . 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 Polysubstance abuse (HCC) 02/27/2017  . Substance induced mood disorder (HCC) 02/27/2017  . Cocaine use disorder, severe, dependence (HCC) 01/05/2017  . 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 Cocaine use disorder, mild, abuse (HCC) 05/03/2016  . Cannabis use disorder, mild, abuse 05/03/2016  . Suicidal ideations 04/30/2016  . Hyperprolactinemia (HCC) 09/24/2015  . Alcohol use disorder, moderate, dependence (HCC) 09/22/2015  . Morbid obesity (HCC) 09/22/2015    History reviewed. No pertinent surgical history.      Home  Medications    Prior to Admission medications   Medication Sig Start Date End Date Taking? Authorizing Provider  acetaminophen (TYLENOL) 325 MG tablet Take 650 mg by mouth every 6 (six) hours as needed for headache or fever. 11/13/18   [provider]  amLODipine (NORVASC) 5 MG tablet Take 1 tablet (5 mg total) by mouth daily. For high blood pressure 06/15/18 06/15/19  McNew, Ileene HutchinsonHolly R, MD  ARIPiprazole (ABILIFY) 5 MG tablet Take 5 mg by mouth daily. For 7 days 07/13/18   [provider]  benzonatate (TESSALON) 100 MG capsule Take 100 mg by mouth 3 (three) times daily as needed for cough. 11/13/18   [provider]  benztropine (COGENTIN) 1 MG tablet Take 1 tablet (1 mg total) by mouth at bedtime. For prevention of drug induced trmors 06/15/18   McNew, Ileene HutchinsonHolly R, MD  doxycycline (VIBRA-TABS) 100 MG tablet Take 100 mg by mouth 2 (two) times daily. 09/24/18   [provider]  GUAIATUSSIN AC 100-10 MG/5ML syrup Take by mouth daily as needed. Take as directed 11/13/18   [provider]  haloperidol (HALDOL) 10 MG tablet Take 1 tablet (10 mg total) by mouth at bedtime. 06/15/18   McNew, Ileene HutchinsonHolly R, MD  traZODone (DESYREL) 50 MG tablet Take 1 tablet (50 mg total) by mouth at bedtime. 06/15/18   McNew, Ileene HutchinsonHolly R, MD    Family History Family History  Problem Relation Age of Onset  . Schizophrenia Mother     Social History Social History   Tobacco Use  . Smoking status:  Current Every Day Smoker    Packs/day: 0.50    Types: Cigarettes  . Smokeless tobacco: Never Used  Substance Use Topics  . Alcohol use: Yes  . Drug use: Yes    Types: Marijuana, "Crack" cocaine, Cocaine     Allergies   Patient has no known allergies.   Review of Systems Review of Systems  Constitutional: Negative for fever.  HENT: Negative for rhinorrhea and sore throat.   Eyes: Negative for redness.  Respiratory: Positive for cough (chronic).   Cardiovascular: Negative for chest  pain.  Gastrointestinal: Negative for abdominal pain, diarrhea, nausea and vomiting.  Genitourinary: Negative for dysuria.  Musculoskeletal: Negative for myalgias.  Skin: Negative for rash.  Neurological: Negative for headaches.  Psychiatric/Behavioral: Negative for agitation, behavioral problems, hallucinations and suicidal ideas.     Physical Exam Updated Vital Signs BP (!) 189/88   Pulse 83   Temp 98.7 F (37.1 C) (Oral)   Ht  (1.727 m)   Wt (!) 181.4 kg   SpO2 100%   BMI 60.82 kg/m   Physical Exam Vitals signs and nursing note reviewed.  Constitutional:      Appearance: He is well-developed.  HENT:     Head: Normocephalic and atraumatic.  Eyes:     General:        Right eye: No discharge.        Left eye: No discharge.     Conjunctiva/sclera: Conjunctivae normal.  Neck:     Musculoskeletal: Normal range of motion and neck supple.  Cardiovascular:     Rate and Rhythm: Normal rate and regular rhythm.     Heart sounds: Normal heart sounds.  Pulmonary:     Effort: Pulmonary effort is normal.     Breath sounds: Normal breath sounds.  Abdominal:     Palpations: Abdomen is soft.     Tenderness: There is no abdominal tenderness.  Skin:    General: Skin is warm and dry.  Neurological:     Mental Status: He is alert.  Psychiatric:        Attention and Perception: Attention normal.        Mood and Affect: Mood normal.        Speech: Speech normal.        Thought Content: Thought content does not include homicidal or suicidal ideation.      ED Treatments / Results  Labs (all labs ordered are listed, but only abnormal results are displayed) Labs Reviewed  COMPREHENSIVE METABOLIC PANEL - Abnormal; Notable for the following components:      Result Value   Albumin 3.4 (*)    All other components within normal limits  CBC - Abnormal; Notable for the following components:   Hemoglobin 12.6 (*)    All other components within normal limits  RAPID URINE DRUG  SCREEN, HOSP PERFORMED - Abnormal; Notable for the following components:   Cocaine POSITIVE (*)    All other components within normal limits  ETHANOL    EKG None  Radiology No results found.  Procedures Procedures (including critical care time)  Medications Ordered in ED Medications - No data to display   Initial Impression / Assessment and Plan / ED Course  I have reviewed the triage vital signs and the nursing notes.  Pertinent labs & imaging results that were available during my care of the patient were reviewed by me and considered in my medical decision making (see chart for details).  Patient seen and examined.    Vital signs reviewed and are as follows: BP (!) 189/88   Pulse 83   Temp 98.7 F (37.1 C) (Oral)   Ht 5\' 8"  (1.727 m)   Wt (!) 181.4 kg   SpO2 100%   BMI 60.82 kg/m   Reviewed previous notes.  Patient has had multiple visits to hospitals [place]  He was at [facility] earlier today.  I discussed with patient that there are likely no inpatient treatment options for him here at behavioral health.    6:30 AM patient cleared for discharge by behavioral health.  Patient provided with homeless shelter and outpatient substance counseling resources.  BP 132/75 (BP Location: Right Arm)   Pulse 68   Temp 98.3 F (36.8 C) (Oral)   Resp 18   Ht 5\' 8"  (1.727 m)   Wt (!) 181.4 kg   SpO2 98%   BMI 60.82 kg/m     Final Clinical Impressions(s) / ED Diagnoses   Final diagnoses:  Polysubstance abuse (HCC)   Stable for d/c. Cleared by [facility].   ED Discharge Orders    None       Renne Crigler, Cordelia Poche 01/09/19 0630    Dione Booze, MD 01/09/19 [phone]

## 2019-01-08 NOTE — ED Triage Notes (Addendum)
Patient here for detox from crack cocaine, benzo(s) and marijauana. States last drink was yesterday. States has never sought tx for detox (see [email or web address]).

## 2019-01-08 NOTE — ED Notes (Signed)
Unable to get patient in paper scrubs due to his size. Need XXXXL size

## 2019-01-09 NOTE — BH Assessment (Signed)
[facility] Assessment Progress Note   Clinician discussed patient care with Nanine Means, DNP.  She said that patient does not meet inpatient care criteria and can be discharged.  Clinician informed Rhea Bleacher, PA of disposition and he is in agreement.

## 2019-01-09 NOTE — Discharge Instructions (Signed)
Follow-up with substance abuse counseling referrals as an outpatient.

## 2019-01-09 NOTE — ED Notes (Signed)
Pt given bus pass for ride home. Patient verbalizes understanding of discharge instructions. Opportunity for questioning and answers were provided. Armband removed by staff, pt discharged from ED.

## 2019-01-09 NOTE — ED Notes (Signed)
Pt resting at this time with eyes closed 
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
## 2019-01-09 NOTE — ED Notes (Signed)
TTS at bedside. 

## 2019-01-09 NOTE — ED Notes (Signed)
Pt requesting something to eat.  Turkey sandwich given 

## 2019-01-09 NOTE — ED Notes (Signed)
Pt given sandwich and drink. Calm and cooperative

## 2019-02-02 IMAGING — CR DG ANKLE COMPLETE 3+V*R*
3 series · 3 of 3 positions shown · non-contrast
Comparison: None.

CLINICAL DATA: Right ankle pain after falling

EXAM:
RIGHT ANKLE - COMPLETE 3+ VIEW

[ankle ap]
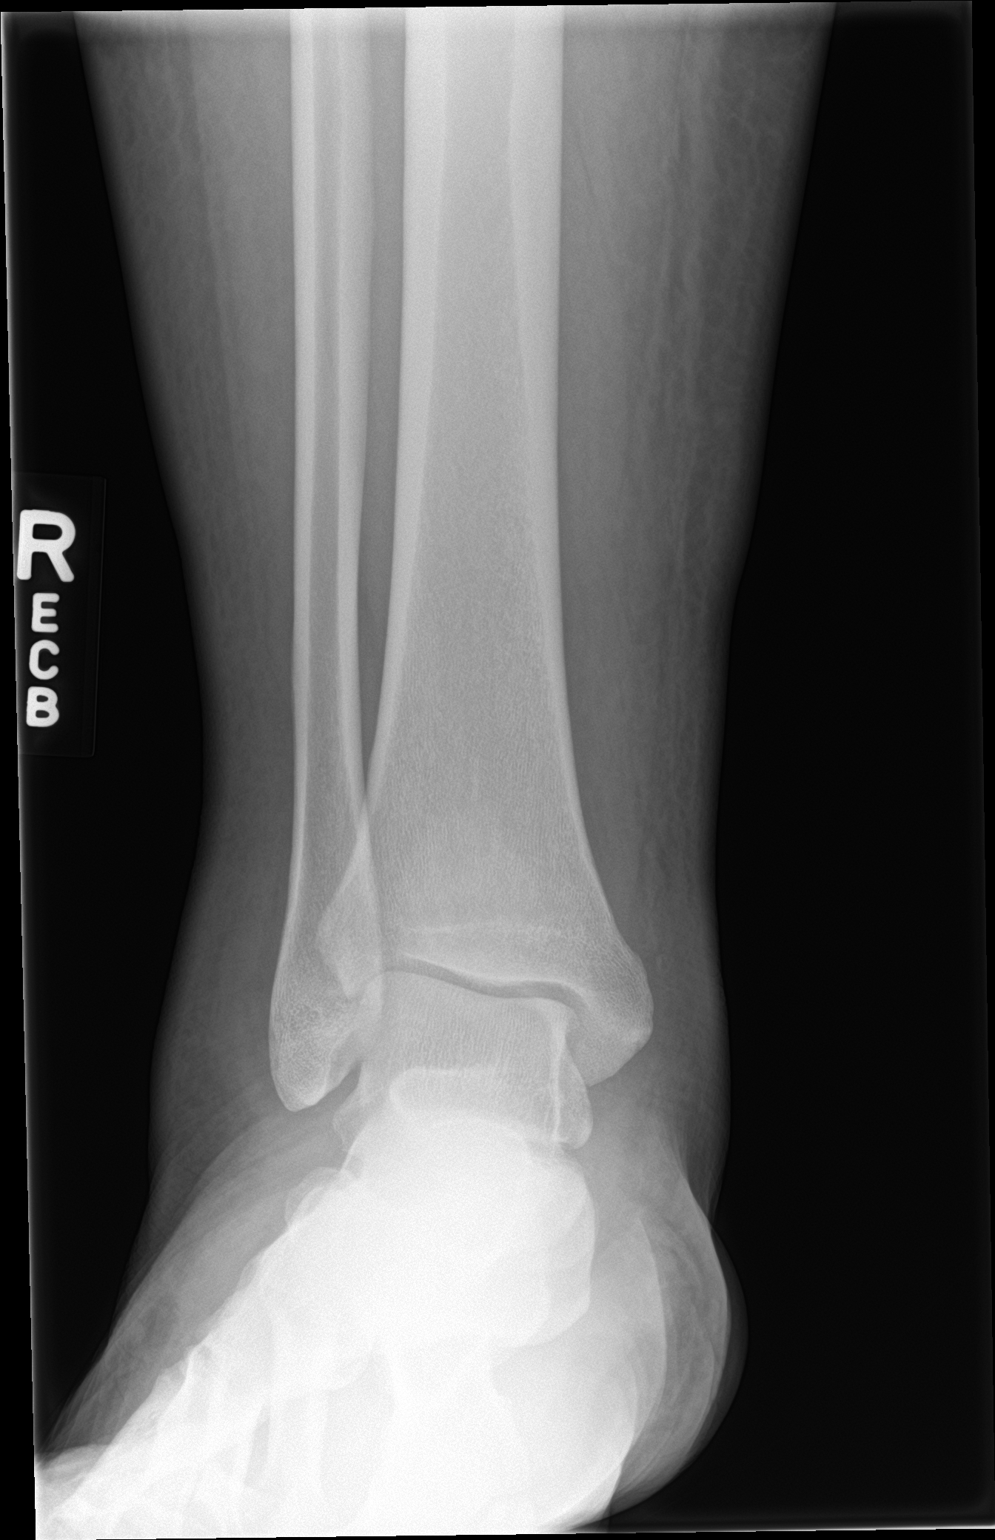

[ankle obl]
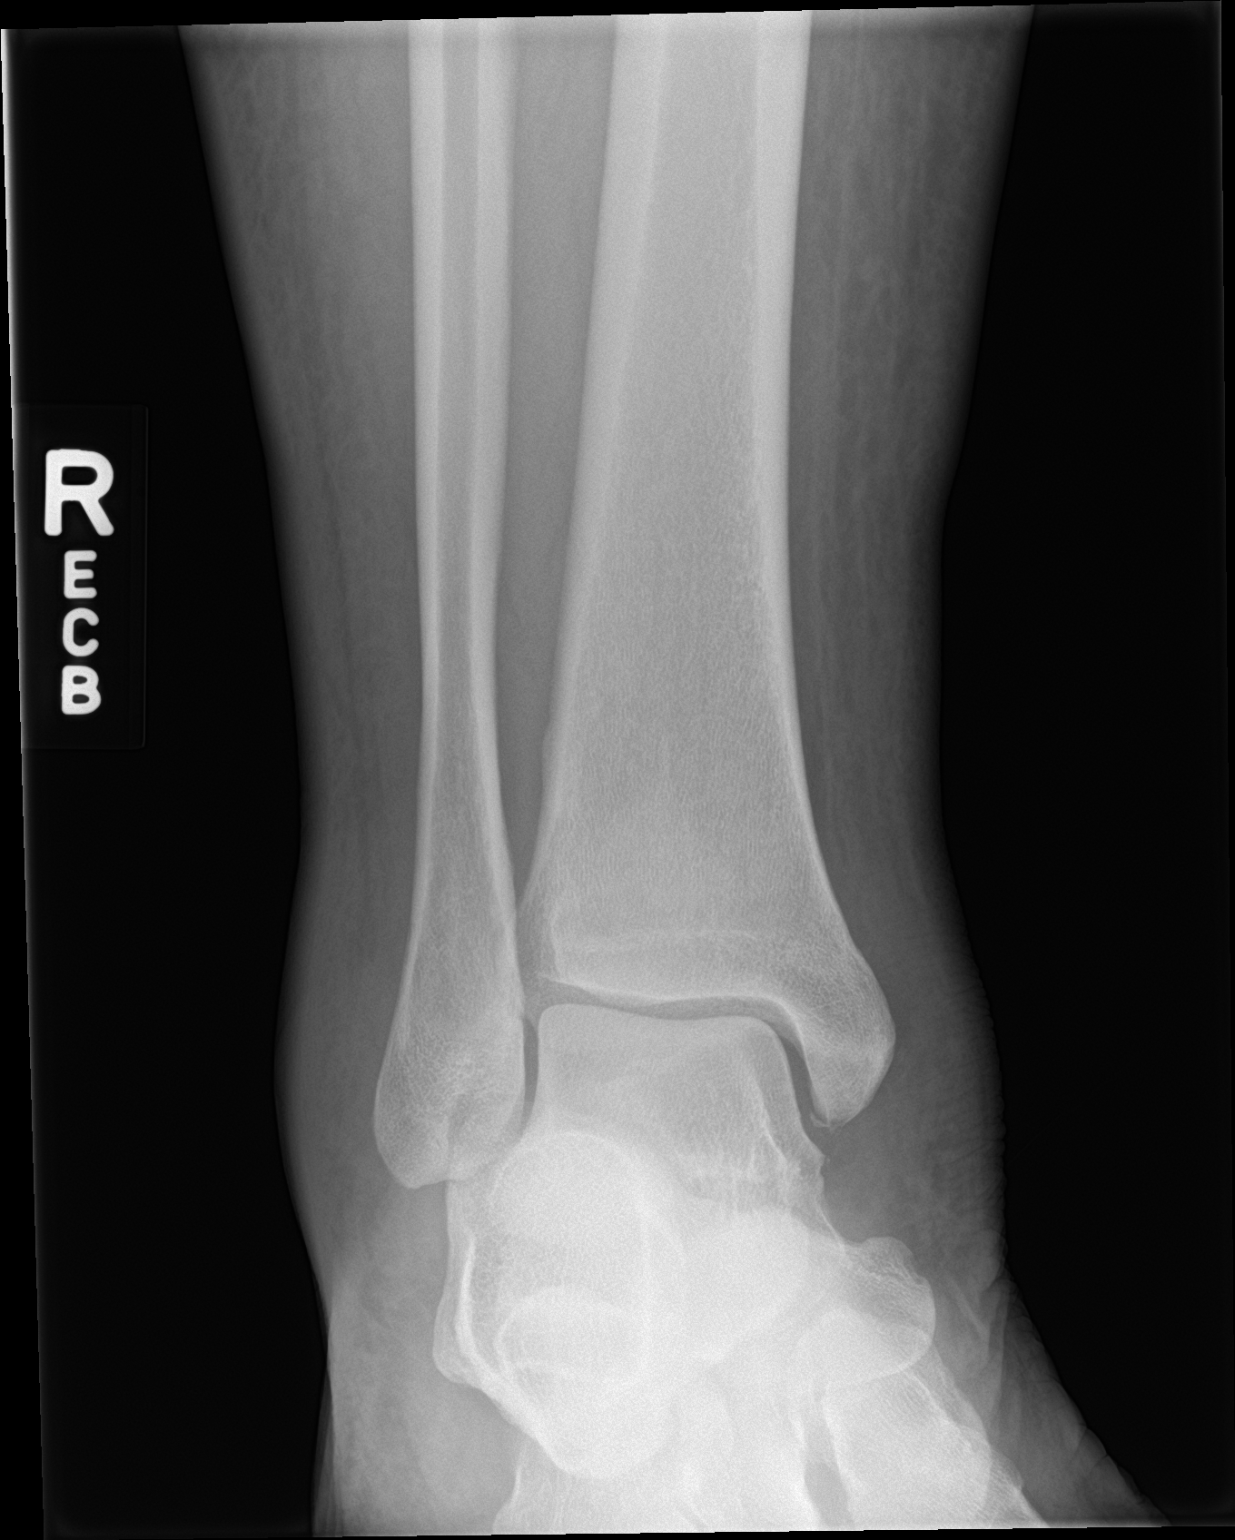

[ankle lat]
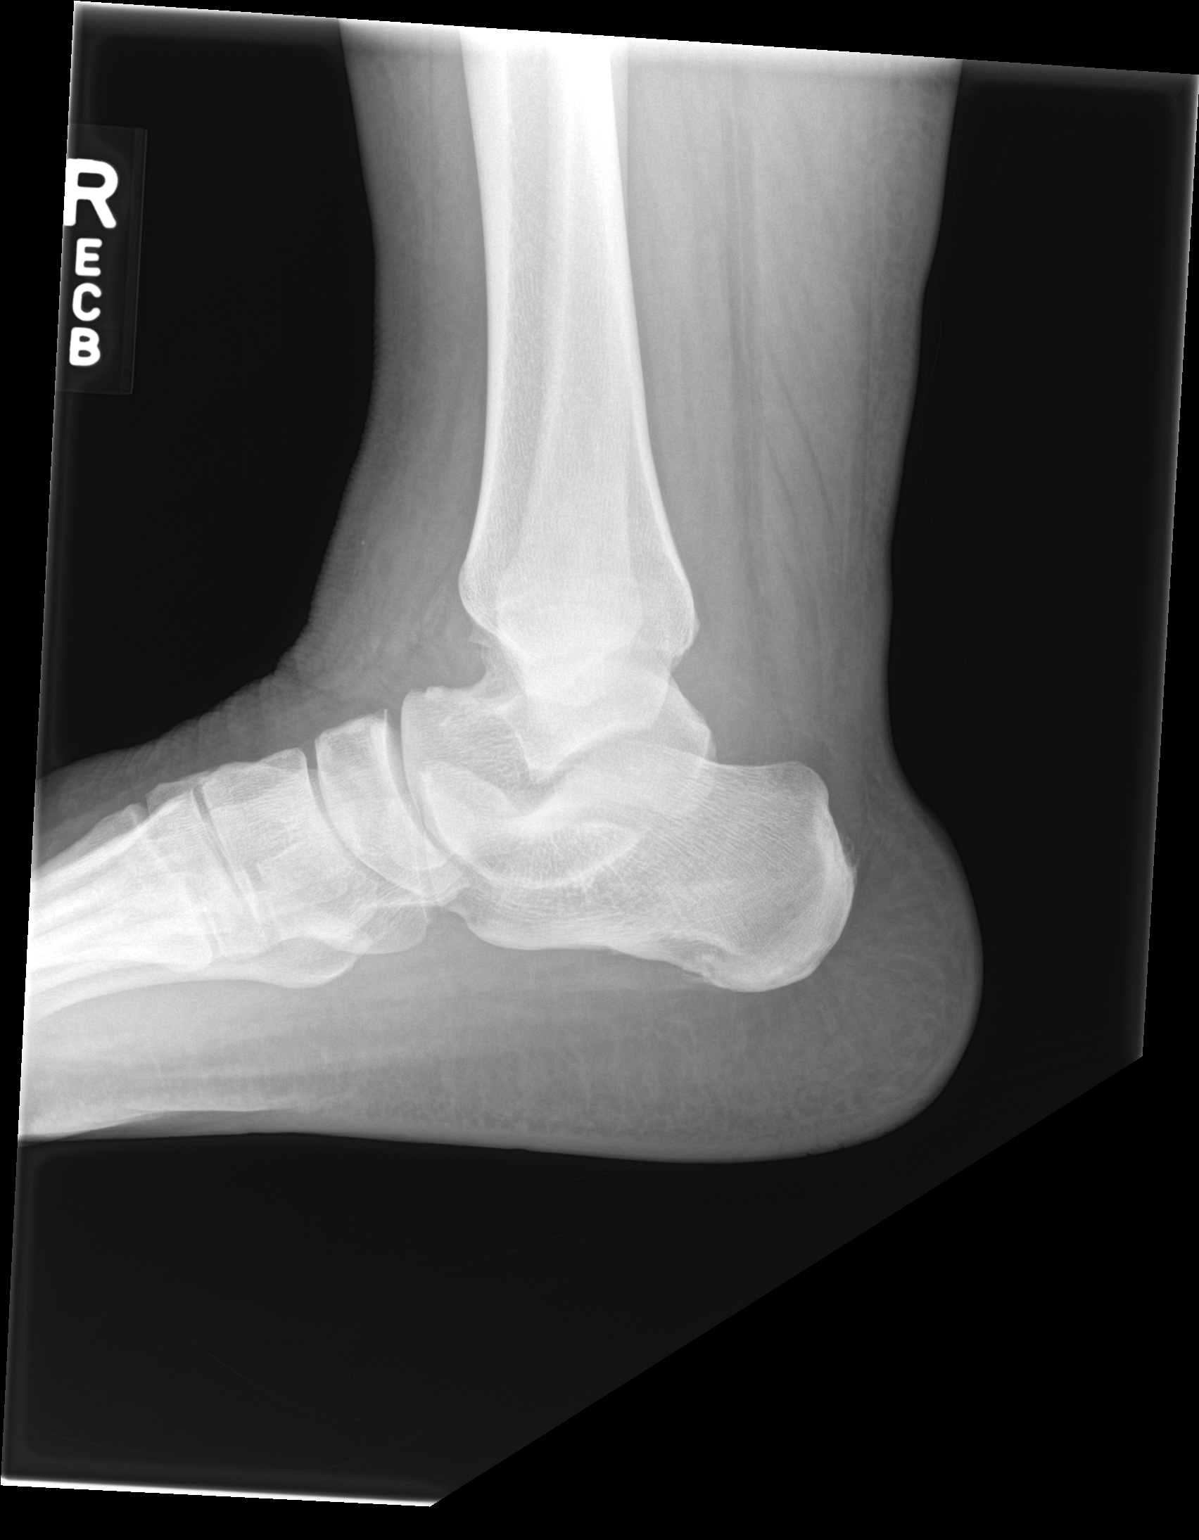

[3 of 3 positions shown; findings below may reference images not displayed]

FINDINGS: There is no evidence of fracture, dislocation, or joint effusion.
There is no evidence of arthropathy or other focal bone abnormality.
Soft tissues are unremarkable. There is flattening of the plantar
arch.
IMPRESSION: No fracture or dislocation of the right ankle.

## 2019-05-02 ENCOUNTER — Emergency Department (HOSPITAL_COMMUNITY)
Admission: EM | Admit: 2019-05-02 | Discharge: 2019-05-02 | Disposition: A | Discharge status: Home / Self Care | Payer: Medicaid Other | Attending: Emergency Medicine | Admitting: Emergency Medicine

## 2019-05-02 ENCOUNTER — Encounter (HOSPITAL_COMMUNITY): Payer: Self-pay | Admitting: Emergency Medicine

## 2019-05-02 DIAGNOSIS — R45851 Suicidal ideations: Secondary | ICD-10-CM | POA: Diagnosis not present

## 2019-05-02 DIAGNOSIS — F1410 Cocaine abuse, uncomplicated: Secondary | ICD-10-CM | POA: Diagnosis not present

## 2019-05-02 DIAGNOSIS — F17210 Nicotine dependence, cigarettes, uncomplicated: Secondary | ICD-10-CM | POA: Diagnosis not present

## 2019-05-02 DIAGNOSIS — Z79899 Other long term (current) drug therapy: Secondary | ICD-10-CM | POA: Diagnosis not present

## 2019-05-02 DIAGNOSIS — R45850 Homicidal ideations: Secondary | ICD-10-CM | POA: Insufficient documentation

## 2019-05-02 DIAGNOSIS — I10 Essential (primary) hypertension: Secondary | ICD-10-CM | POA: Diagnosis not present

## 2019-05-02 DIAGNOSIS — F209 Schizophrenia, unspecified: Secondary | ICD-10-CM | POA: Diagnosis not present

## 2019-05-02 LAB — CBC
HCT: 42.3 % (ref 39.0–52.0)
Hemoglobin: 12.8 g/dL — ABNORMAL LOW (ref 13.0–17.0)
MCH: 29.4 pg (ref 26.0–34.0)
MCHC: 30.3 g/dL (ref 30.0–36.0)
MCV: 97.2 fL (ref 80.0–100.0)
Platelets: 193 10*3/uL (ref 150–400)
RBC: 4.35 MIL/uL (ref 4.22–5.81)
RDW: 15.8 % — ABNORMAL HIGH (ref 11.5–15.5)
WBC: 7.6 10*3/uL (ref 4.0–10.5)
nRBC: 0 % (ref 0.0–0.2)

## 2019-05-02 LAB — COMPREHENSIVE METABOLIC PANEL
ALT: 26 U/L (ref 0–44)
AST: 22 U/L (ref 15–41)
Albumin: 3.4 g/dL — ABNORMAL LOW (ref 3.5–5.0)
Alkaline Phosphatase: 66 U/L (ref 38–126)
Anion gap: 12 (ref 5–15)
BUN: 16 mg/dL (ref 6–20)
CO2: 22 mmol/L (ref 22–32)
Calcium: 8.8 mg/dL — ABNORMAL LOW (ref 8.9–10.3)
Chloride: 106 mmol/L (ref 98–111)
Creatinine, Ser: 1.2 mg/dL (ref 0.61–1.24)
GFR calc Af Amer: >60 mL/min (ref >60)
GFR calc non Af Amer: >60 mL/min (ref >60)
Glucose, Bld: 80 mg/dL (ref 70–99)
Potassium: 4.3 mmol/L (ref 3.5–5.1)
Sodium: 140 mmol/L (ref 135–145)
Total Bilirubin: 0.4 mg/dL (ref 0.3–1.2)
Total Protein: 7.8 g/dL (ref 6.5–8.1)

## 2019-05-02 LAB — RAPID URINE DRUG SCREEN, HOSP PERFORMED
Amphetamines: NOT DETECTED
Barbiturates: NOT DETECTED
Benzodiazepines: NOT DETECTED
Cocaine: POSITIVE — AB
Opiates: NOT DETECTED
Tetrahydrocannabinol: NOT DETECTED

## 2019-05-02 LAB — ETHANOL: Alcohol, Ethyl (B): <10 mg/dL (ref <10)

## 2019-05-02 LAB — ACETAMINOPHEN LEVEL: Acetaminophen (Tylenol), Serum: <10 ug/mL — ABNORMAL LOW (ref 10–30)

## 2019-05-02 LAB — SALICYLATE LEVEL: Salicylate Lvl: <7 mg/dL (ref 2.8–30.0)

## 2019-05-02 MED ORDER — ACETAMINOPHEN 325 MG PO TABS
650.00 | ORAL_TABLET | ORAL | Status: DC
Start: ? — End: 2019-05-02

## 2019-05-02 MED ORDER — THIAMINE HCL 100 MG PO TABS
100.00 | ORAL_TABLET | ORAL | Status: DC
Start: 2019-05-02 — End: 2019-05-02

## 2019-05-02 MED ORDER — TRAZODONE HCL 100 MG PO TABS
100.00 | ORAL_TABLET | ORAL | Status: DC
Start: ? — End: 2019-05-02

## 2019-05-02 MED ORDER — GENERIC EXTERNAL MEDICATION
Status: DC
Start: ? — End: 2019-05-02

## 2019-05-02 MED ORDER — THERA PO TABS
1.00 | ORAL_TABLET | ORAL | Status: DC
Start: 2019-05-02 — End: 2019-05-02

## 2019-05-02 MED ORDER — AMLODIPINE BESYLATE 5 MG PO TABS
5.00 | ORAL_TABLET | ORAL | Status: DC
Start: 2019-05-02 — End: 2019-05-02

## 2019-05-02 MED ORDER — BISACODYL 5 MG PO TBEC
5.00 | DELAYED_RELEASE_TABLET | ORAL | Status: DC
Start: ? — End: 2019-05-02

## 2019-05-02 MED ORDER — ALUM & MAG HYDROXIDE-SIMETH 200-200-20 MG/5ML PO SUSP
30.00 | ORAL | Status: DC
Start: ? — End: 2019-05-02

## 2019-05-02 MED ORDER — HYDROXYZINE PAMOATE 50 MG PO CAPS
50.00 | ORAL_CAPSULE | ORAL | Status: DC
Start: ? — End: 2019-05-02

## 2019-05-02 MED ORDER — FOLIC ACID 1 MG PO TABS
1.00 | ORAL_TABLET | ORAL | Status: DC
Start: 2019-05-02 — End: 2019-05-02

## 2019-05-02 NOTE — ED Notes (Signed)
Patients belongings returned to patient .
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
## 2019-05-02 NOTE — ED Notes (Signed)
Patient requesting to leave.  Note from [facility] says discharge, spoke with ED provider and he is aware patient wants his belonging back and wants to leave.  He is ok if he leaves.
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
## 2019-05-02 NOTE — ED Notes (Addendum)
Ordered meal tray for pt. Placed TTS machine in pt's room.
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
## 2019-05-02 NOTE — ED Notes (Signed)
Pt changed into large gown- pt states none of the maroon scrubs fit him. Pt belongings removed from him and placed in locker 4- sleeping bag outside of locker 4 and labeled.

## 2019-05-10 ENCOUNTER — Emergency Department (HOSPITAL_BASED_OUTPATIENT_CLINIC_OR_DEPARTMENT_OTHER): Payer: Medicaid Other

## 2019-05-10 ENCOUNTER — Other Ambulatory Visit: Payer: Self-pay

## 2019-05-10 ENCOUNTER — Emergency Department (HOSPITAL_COMMUNITY): Payer: Medicaid Other

## 2019-05-10 ENCOUNTER — Encounter (HOSPITAL_COMMUNITY): Payer: Self-pay | Admitting: Emergency Medicine

## 2019-05-10 ENCOUNTER — Emergency Department (HOSPITAL_COMMUNITY)
Admission: EM | Admit: 2019-05-10 | Discharge: 2019-05-10 | Disposition: A | Discharge status: Home / Self Care | Payer: Medicaid Other | Attending: Emergency Medicine | Admitting: Emergency Medicine

## 2019-05-10 DIAGNOSIS — R52 Pain, unspecified: Secondary | ICD-10-CM

## 2019-05-10 DIAGNOSIS — F17210 Nicotine dependence, cigarettes, uncomplicated: Secondary | ICD-10-CM | POA: Diagnosis not present

## 2019-05-10 DIAGNOSIS — Z79899 Other long term (current) drug therapy: Secondary | ICD-10-CM | POA: Diagnosis not present

## 2019-05-10 DIAGNOSIS — M79661 Pain in right lower leg: Secondary | ICD-10-CM | POA: Insufficient documentation

## 2019-05-10 DIAGNOSIS — M7989 Other specified soft tissue disorders: Secondary | ICD-10-CM | POA: Diagnosis not present

## 2019-05-10 DIAGNOSIS — M79604 Pain in right leg: Secondary | ICD-10-CM

## 2019-05-10 MED ORDER — MELOXICAM 7.5 MG PO TABS: 7.5000 mg | ORAL_TABLET | Freq: Every day | ORAL | 0 refills | Status: AC

## 2019-05-10 NOTE — ED Notes (Addendum)
Patient Alert and oriented to baseline. Stable and ambulatory to baseline. Patient verbalized understanding of the discharge instructions.  Patient belongings were taken by the patient. Bruce [name] Bus pass given to pt before discharge.

## 2019-05-10 NOTE — ED Provider Notes (Signed)
[facility] EMERGENCY DEPARTMENT Provider Note   CSN: [id number] Arrival date & time: 05/10/19  1026     History   Chief Complaint Chief Complaint  Patient presents with  . Leg Pain    HPI Bruce [name] is a 43 y.o. male.     43 year old male presents with complaint of right lower leg pain and swelling.  Patient reports symptoms over the past 3 weeks, progressively worsening.  Patient denies history of DVT, no history of injury to the leg recent or previously.  Pain is worse with bearing weight and walking, located through the arch of the foot and anterior right lower leg.  No other complaints or concerns.     Past Medical History:  Diagnosis Date  . Depression   . ETOH abuse   . Hypertension   . Obesity   . Schizophrenia (HCC)   . Substance abuse [facility])     Patient Active Problem List   Diagnosis Date Noted  . Malingering 06/12/2018  . Tobacco use disorder 06/09/2018  . HTN (hypertension) 06/09/2018  . Schizoaffective disorder, depressive type (HCC) 08/04/2017  . 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 Polysubstance abuse (HCC) 02/27/2017  . Substance induced mood disorder (HCC) 02/27/2017  . Cocaine use disorder, severe, dependence (HCC) 01/05/2017  . 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 Cocaine use disorder, mild, abuse (HCC) 05/03/2016  . Cannabis use disorder, mild, abuse 05/03/2016  . Suicidal ideations 04/30/2016  . Hyperprolactinemia (HCC) 09/24/2015  . Alcohol use disorder, moderate, dependence (HCC) 09/22/2015  . Morbid obesity (HCC) 09/22/2015    History reviewed. No pertinent surgical history.      Home Medications    Prior to Admission medications   Medication Sig Start Date End Date Taking? Authorizing Provider  acetaminophen (TYLENOL) 500 MG tablet Take 2,000 mg by mouth as needed for mild pain, moderate pain or headache.     [provider]  amLODipine (NORVASC) 5 MG tablet Take 5 mg  by mouth daily. 03/04/19   [provider]  ARIPiprazole Lauroxil ER 1064 MG/3.9ML PRSY Inject 3.9 mLs into the muscle See admin instructions. Every two months 06/25/19 06/26/19  [provider]  meloxicam (MOBIC) 7.5 MG tablet Take 1 tablet (7.5 mg total) by mouth daily for 10 days. 05/10/19 05/20/19  Jeannie Fend, PA-C    Family History Family History  Problem Relation Age of Onset  . Schizophrenia Mother     Social History Social History   Tobacco Use  . Smoking status: Current Every Day Smoker    Packs/day: 0.50    Types: Cigarettes  . Smokeless tobacco: Never Used  Substance Use Topics  . Alcohol use: Yes    Comment: BAC was clear  . Drug use: Yes    Types: Marijuana, "Crack" cocaine, Cocaine    Comment: +Cocaine     Allergies   Patient has no known allergies.   Review of Systems Review of Systems  Constitutional: Negative for fever.  Musculoskeletal: Positive for arthralgias, gait problem and myalgias. Negative for back pain.  Skin: Negative for color change, rash and wound.  Allergic/Immunologic: Negative for immunocompromised state.  Neurological: Negative for weakness and numbness.  All other systems reviewed and are negative.    Physical Exam Updated Vital Signs BP (!) 161/93 (BP Location: Left Wrist)   Pulse 85   Temp 98.3 F (36.8 C) (Oral)   Resp 16   SpO2 100%   Physical Exam Vitals signs and nursing note  reviewed.  Constitutional:      General: He is not in acute distress.    Appearance: He is well-developed. He is not diaphoretic.  HENT:     Head: Normocephalic and atraumatic.  Cardiovascular:     Pulses: Normal pulses.  Pulmonary:     Effort: Pulmonary effort is normal.  Musculoskeletal:        General: Swelling and tenderness present. No deformity or signs of injury.     Right lower leg: He exhibits tenderness. No edema.     Left lower leg: No edema.       Legs:     Right foot: Normal range of motion. Tenderness  and swelling present. No deformity or laceration.     Comments: DP pulses symmetric, sensation intact, no skin changes. Moderate swelling right lower leg. No calf tenderness.   Skin:    General: Skin is warm and dry.     Findings: No erythema or rash.  Neurological:     Mental Status: He is alert and oriented to person, place, and time.  Psychiatric:        Behavior: Behavior normal.      ED Treatments / Results  Labs (all labs ordered are listed, but only abnormal results are displayed) Labs Reviewed - No data to display  EKG None  Radiology Dg Tibia/fibula Right  Result Date: 05/10/2019 CLINICAL DATA:  Pain and swelling. EXAM: RIGHT TIBIA AND FIBULA - 2 VIEW COMPARISON:  None. FINDINGS: There is no fracture or dislocation or joint effusion. Tiny marginal osteophyte on the patella. Small osteophytes on the distal tibia. Subcutaneous edema in the lower leg. IMPRESSION: No significant osseous abnormality. Subcutaneous edema. Electronically Signed   By: Lorriane Shire M.D.   On: 05/10/2019 13:32   Dg Foot Complete Right  Result Date: 05/10/2019 CLINICAL DATA:  Pain and swelling. No known injury. EXAM: RIGHT FOOT COMPLETE - 3+ VIEW COMPARISON:  None. FINDINGS: There is no evidence of fracture or dislocation. Minimal degenerative changes at the ankle joint. Nonspecific soft tissue swelling at the ankle and on the dorsum of the foot. IMPRESSION: Nonspecific soft tissue swelling. Minimal degenerative changes at the ankle joint. Electronically Signed   By: Lorriane Shire M.D.   On: 05/10/2019 13:33    Procedures Procedures (including critical care time)  Medications Ordered in ED Medications - No data to display   Initial Impression / Assessment and Plan / ED Course  I have reviewed the triage vital signs and the nursing notes.  Pertinent labs & imaging results that were available during my care of the patient were reviewed by me and considered in my medical decision making (see  chart for details).  Clinical Course as of May 09 1340  Thu May 09, 7032  4223 43 year old male with complaint of right lower leg pain and swelling, gradual onset and progressively worsening over the past 3 weeks without history of injury, no history of prior DVT.  On exam patient has moderate swelling of the right leg compared to the left, no pitting edema.  Pulses and sensation intact.  Pain primarily to the anterior right lower leg onto the medial aspect of the right foot.  X-rays are negative for stress fracture or significant acute findings.  Preliminary DVT study negative for DVT.Compression hose, elevate extremity, NSAIDs for pain as needed, follow-up with PCP if symptoms persist.   [LM]    Clinical Course User Index [LM] Tacy Learn, PA-C      Final Clinical  Impressions(s) / ED Diagnoses   Final diagnoses:  Right leg pain  Right leg swelling    ED Discharge Orders         Ordered    meloxicam (MOBIC) 7.5 MG tablet  Daily     05/10/19 1340           Alden HippMurphy, Brandis Wixted A, PA-C 05/10/19 1341    Alvira MondaySchlossman, Erin, MD 05/10/19 2157

## 2019-05-10 NOTE — Progress Notes (Signed)
Right lower extremity venous duplex completed. Refer to "CV Proc" under chart review to view preliminary results.  05/10/2019 1:47 PM Maudry Mayhew, MHA, RVT, RDCS, RDMS

## 2019-05-10 NOTE — Discharge Instructions (Addendum)
Elevate leg to help with pain and swelling. Apply compression hose to right leg first this in the morning to help minimize swelling. Follow up with primary care provider.

## 2019-05-10 NOTE — ED Triage Notes (Signed)
Pt states he is here for right lower leg swelling and pain. Pt states this has been going on for several weeks. Denies hx of DVT. Denies redness.

## 2019-07-12 ENCOUNTER — Encounter (HOSPITAL_COMMUNITY): Payer: Self-pay | Admitting: Clinical

## 2019-07-12 ENCOUNTER — Other Ambulatory Visit: Payer: Self-pay | Admitting: Behavioral Health

## 2019-07-12 ENCOUNTER — Encounter (HOSPITAL_COMMUNITY): Payer: Self-pay | Admitting: Emergency Medicine

## 2019-07-12 ENCOUNTER — Observation Stay (HOSPITAL_COMMUNITY)
Admission: AD | Admit: 2019-07-12 | Discharge: 2019-07-13 | Disposition: A | Discharge status: Home / Self Care | Payer: Medicaid Other | Source: Intra-hospital | Attending: Psychiatry | Admitting: Psychiatry

## 2019-07-12 ENCOUNTER — Other Ambulatory Visit: Payer: Self-pay

## 2019-07-12 ENCOUNTER — Emergency Department (HOSPITAL_COMMUNITY)
Admission: EM | Admit: 2019-07-12 | Discharge: 2019-07-12 | Disposition: A | Discharge status: Other Acute Inpatient Hospital | Payer: Medicaid Other | Attending: Emergency Medicine | Admitting: Emergency Medicine

## 2019-07-12 DIAGNOSIS — R45851 Suicidal ideations: Secondary | ICD-10-CM | POA: Diagnosis not present

## 2019-07-12 DIAGNOSIS — R441 Visual hallucinations: Secondary | ICD-10-CM | POA: Diagnosis not present

## 2019-07-12 DIAGNOSIS — R440 Auditory hallucinations: Secondary | ICD-10-CM | POA: Diagnosis present

## 2019-07-12 DIAGNOSIS — F1490 Cocaine use, unspecified, uncomplicated: Secondary | ICD-10-CM | POA: Insufficient documentation

## 2019-07-12 DIAGNOSIS — Z20828 Contact with and (suspected) exposure to other viral communicable diseases: Secondary | ICD-10-CM | POA: Diagnosis not present

## 2019-07-12 DIAGNOSIS — E669 Obesity, unspecified: Secondary | ICD-10-CM | POA: Insufficient documentation

## 2019-07-12 DIAGNOSIS — F329 Major depressive disorder, single episode, unspecified: Secondary | ICD-10-CM | POA: Diagnosis not present

## 2019-07-12 DIAGNOSIS — F17210 Nicotine dependence, cigarettes, uncomplicated: Secondary | ICD-10-CM | POA: Insufficient documentation

## 2019-07-12 DIAGNOSIS — R45850 Homicidal ideations: Secondary | ICD-10-CM | POA: Diagnosis not present

## 2019-07-12 DIAGNOSIS — F24 Shared psychotic disorder: Secondary | ICD-10-CM | POA: Insufficient documentation

## 2019-07-12 DIAGNOSIS — F14959 Cocaine use, unspecified with cocaine-induced psychotic disorder, unspecified: Secondary | ICD-10-CM | POA: Diagnosis present

## 2019-07-12 DIAGNOSIS — F209 Schizophrenia, unspecified: Secondary | ICD-10-CM | POA: Diagnosis not present

## 2019-07-12 DIAGNOSIS — F1410 Cocaine abuse, uncomplicated: Secondary | ICD-10-CM | POA: Diagnosis not present

## 2019-07-12 DIAGNOSIS — I10 Essential (primary) hypertension: Secondary | ICD-10-CM | POA: Diagnosis not present

## 2019-07-12 LAB — COMPREHENSIVE METABOLIC PANEL
ALT: 15 U/L (ref 0–44)
AST: 19 U/L (ref 15–41)
Albumin: 3.3 g/dL — ABNORMAL LOW (ref 3.5–5.0)
Alkaline Phosphatase: 62 U/L (ref 38–126)
Anion gap: 10 (ref 5–15)
BUN: 13 mg/dL (ref 6–20)
CO2: 27 mmol/L (ref 22–32)
Calcium: 9.1 mg/dL (ref 8.9–10.3)
Chloride: 102 mmol/L (ref 98–111)
Creatinine, Ser: 1 mg/dL (ref 0.61–1.24)
GFR calc Af Amer: >60 mL/min (ref >60)
GFR calc non Af Amer: >60 mL/min (ref >60)
Glucose, Bld: 94 mg/dL (ref 70–99)
Potassium: 4.3 mmol/L (ref 3.5–5.1)
Sodium: 139 mmol/L (ref 135–145)
Total Bilirubin: 0.9 mg/dL (ref 0.3–1.2)
Total Protein: 7.4 g/dL (ref 6.5–8.1)

## 2019-07-12 LAB — CBC
HCT: 39.4 % (ref 39.0–52.0)
Hemoglobin: 12.5 g/dL — ABNORMAL LOW (ref 13.0–17.0)
MCH: 30.2 pg (ref 26.0–34.0)
MCHC: 31.7 g/dL (ref 30.0–36.0)
MCV: 95.2 fL (ref 80.0–100.0)
Platelets: 229 10*3/uL (ref 150–400)
RBC: 4.14 MIL/uL — ABNORMAL LOW (ref 4.22–5.81)
RDW: 15.5 % (ref 11.5–15.5)
WBC: 7.6 10*3/uL (ref 4.0–10.5)
nRBC: 0 % (ref 0.0–0.2)

## 2019-07-12 LAB — RAPID URINE DRUG SCREEN, HOSP PERFORMED
Amphetamines: NOT DETECTED
Barbiturates: NOT DETECTED
Benzodiazepines: NOT DETECTED
Cocaine: POSITIVE — AB
Opiates: NOT DETECTED
Tetrahydrocannabinol: NOT DETECTED

## 2019-07-12 LAB — ETHANOL: Alcohol, Ethyl (B): <10 mg/dL (ref <10)

## 2019-07-12 LAB — SARS CORONAVIRUS 2 (TAT 6-24 HRS): SARS Coronavirus 2: NEGATIVE

## 2019-07-12 LAB — SALICYLATE LEVEL: Salicylate Lvl: <7 mg/dL (ref 2.8–30.0)

## 2019-07-12 LAB — ACETAMINOPHEN LEVEL: Acetaminophen (Tylenol), Serum: <10 ug/mL — ABNORMAL LOW (ref 10–30)

## 2019-07-12 MED ORDER — ALUM & MAG HYDROXIDE-SIMETH 200-200-20 MG/5ML PO SUSP: 30.0000 mL | Freq: Four times a day (QID) | ORAL | Status: DC | PRN

## 2019-07-12 MED ORDER — AMLODIPINE BESYLATE 10 MG PO TABS
5.00 | ORAL_TABLET | ORAL | Status: DC
Start: 2019-07-13 — End: 2019-07-12

## 2019-07-12 MED ORDER — NICOTINE 21 MG/24HR TD PT24
21.0000 mg | MEDICATED_PATCH | Freq: Every day | TRANSDERMAL | Status: DC
  Administered 2019-07-12: 21 mg via TRANSDERMAL
  Filled 2019-07-12: qty 1

## 2019-07-12 MED ORDER — NICOTINE POLACRILEX 2 MG MT GUM
4.00 | CHEWING_GUM | OROMUCOSAL | Status: DC
Start: ? — End: 2019-07-12

## 2019-07-12 MED ORDER — LORAZEPAM 1 MG PO TABS: 0.0000 mg | ORAL_TABLET | Freq: Four times a day (QID) | ORAL | Status: DC

## 2019-07-12 MED ORDER — ACETAMINOPHEN 325 MG PO TABS: 650.0000 mg | ORAL_TABLET | ORAL | Status: DC | PRN

## 2019-07-12 MED ORDER — GENERIC EXTERNAL MEDICATION
Status: DC
Start: ? — End: 2019-07-12

## 2019-07-12 MED ORDER — ACETAMINOPHEN 325 MG PO TABS: 650.0000 mg | ORAL_TABLET | Freq: Four times a day (QID) | ORAL | Status: DC | PRN

## 2019-07-12 MED ORDER — VITAMIN B-1 100 MG PO TABS
100.0000 mg | ORAL_TABLET | Freq: Every day | ORAL | Status: DC
  Administered 2019-07-12: 100 mg via ORAL
  Filled 2019-07-12: qty 1

## 2019-07-12 MED ORDER — CEPHALEXIN 500 MG PO CAPS
500.00 | ORAL_CAPSULE | ORAL | Status: DC
Start: 2019-07-12 — End: 2019-07-12

## 2019-07-12 MED ORDER — LORAZEPAM 2 MG/ML IJ SOLN: 0.0000 mg | Freq: Four times a day (QID) | INTRAMUSCULAR | Status: DC

## 2019-07-12 MED ORDER — BISACODYL 5 MG PO TBEC
5.00 | DELAYED_RELEASE_TABLET | ORAL | Status: DC
Start: ? — End: 2019-07-12

## 2019-07-12 MED ORDER — THIAMINE HCL 100 MG/ML IJ SOLN: 100.0000 mg | Freq: Every day | INTRAMUSCULAR | Status: DC

## 2019-07-12 MED ORDER — ONDANSETRON HCL 4 MG PO TABS: 4.0000 mg | ORAL_TABLET | Freq: Three times a day (TID) | ORAL | Status: DC | PRN

## 2019-07-12 MED ORDER — LORAZEPAM 2 MG/ML IJ SOLN: 0.0000 mg | Freq: Two times a day (BID) | INTRAMUSCULAR | Status: DC

## 2019-07-12 MED ORDER — ACETAMINOPHEN 325 MG PO TABS
650.00 | ORAL_TABLET | ORAL | Status: DC
Start: ? — End: 2019-07-12

## 2019-07-12 MED ORDER — TRAZODONE HCL 50 MG PO TABS: 50.0000 mg | ORAL_TABLET | Freq: Every evening | ORAL | Status: DC | PRN

## 2019-07-12 MED ORDER — AMLODIPINE BESYLATE 5 MG PO TABS
5.0000 mg | ORAL_TABLET | Freq: Every day | ORAL | Status: DC
  Administered 2019-07-12: 5 mg via ORAL
  Filled 2019-07-12: qty 1

## 2019-07-12 MED ORDER — HYDROXYZINE HCL 25 MG PO TABS: 25.0000 mg | ORAL_TABLET | Freq: Three times a day (TID) | ORAL | Status: DC | PRN

## 2019-07-12 MED ORDER — LORAZEPAM 0.5 MG PO TABS
0.50 | ORAL_TABLET | ORAL | Status: DC
Start: ? — End: 2019-07-12

## 2019-07-12 MED ORDER — LORAZEPAM 2 MG/ML IJ SOLN
2.00 | INTRAMUSCULAR | Status: DC
Start: ? — End: 2019-07-12

## 2019-07-12 MED ORDER — ALUM & MAG HYDROXIDE-SIMETH 200-200-20 MG/5ML PO SUSP
30.00 | ORAL | Status: DC
Start: ? — End: 2019-07-12

## 2019-07-12 MED ORDER — ZOLPIDEM TARTRATE 5 MG PO TABS: 5.0000 mg | ORAL_TABLET | Freq: Every evening | ORAL | Status: DC | PRN

## 2019-07-12 MED ORDER — LORAZEPAM 1 MG PO TABS: 0.0000 mg | ORAL_TABLET | Freq: Two times a day (BID) | ORAL | Status: DC

## 2019-07-12 NOTE — ED Notes (Addendum)
Large suitcase labeled w/ pt label and secured at [employer] station with Jacqlyn Larsen, Therapist, sports.

## 2019-07-12 NOTE — ED Notes (Signed)
Informed AC of Patient Status and COVID status

## 2019-07-12 NOTE — ED Notes (Addendum)
Attempted to call report for pt; secretary would call me back once nurse was ready or when COVID-19 Test results result.

## 2019-07-12 NOTE — BH Assessment (Addendum)
Tele Assessment Note   Patient Name: Bruce [name] MRN: [id number] Referring Physician: Clarene Duke Location of Patient: [facility] ED Location of Provider: Behavioral Health TTS Department  Bruce [name] is an 43 y.o. male presenting voluntarily to [facility] ED via [employer] complaining of SI/HI and AVH. Patient has history of numerous ED visits for similar concerns and has history of malingering. Patient reports he got into an argument with his aunt and cousin, whom he was staying with, and is hearing voices to kill them. He states "I want to pour gasoline on their house and burn it down. I want to smell flesh burning right now." He also reports the voices are telling him to harm himself. Patient states he went to [facility] to initiate ACTT services but they "laughed in my face and wouldn't help me." He states he used to get a monthly injection but has not had it in 2 months. Patient reports 2 days ago he took "40 herbal pills" in an attempt to kill himself. Patient initially denies any substance use, however with further questioning admits to crack cocaine and alcohol use, but is vague in specifics. He denies any current charges. He states he would like to be in patient to get on medications.  Patient is alert and oriented x 4. He is dressed appropriately. His speech is logical, eye contact is good, and his thoughts are organized. Patient's mood is pleasant and his affect is congruent. He has poor insight, judgement, and impulse control. He does not appear to be responding to internal stimuli or experiencing delusional thought content.   Diagnosis: Schizophrenia (per history)   Polysubstance use  Past Medical History:  Past Medical History:  Diagnosis Date  . Depression   . ETOH abuse   . Hypertension   . Obesity   . Schizophrenia (HCC)   . Substance abuse (HCC)     History reviewed. No pertinent surgical history.  Family History:  Family History  Problem Relation Age of Onset  . Schizophrenia Mother      Social History:  reports that he has been smoking cigarettes. He has been smoking about 0.50 packs per day. He has never used smokeless tobacco. He reports current alcohol use. He reports current drug use. Drugs: Marijuana, "Crack" cocaine, and Cocaine.  Additional Social History:  Alcohol / Drug Use Pain Medications: see MAR Prescriptions: see MAR Over the Counter: see MAR History of alcohol / drug use?: Yes Substance #1 Name of Substance 1: crack cocaine 1 - Age of First Use: UTA 1 - Amount (size/oz): varies 1 - Frequency: daily 1 - Duration: intermittently 1 - Last Use / Amount: 11/12 Substance #2 Name of Substance 2: Alcohol 2 - Age of First Use: UTA 2 - Amount (size/oz): varies 2 - Frequency: UTA 2 - Duration: intermittently 2 - Last Use / Amount: 11/12  CIWA: CIWA-Ar BP: (!) 154/106 Pulse Rate: 96 Nausea and Vomiting: no nausea and no vomiting Tactile Disturbances: none Tremor: no tremor Auditory Disturbances: moderately severe hallucinations Paroxysmal Sweats: no sweat visible Visual Disturbances: moderately severe hallucinations Anxiety: no anxiety, at ease Headache, Fullness in Head: very mild Agitation: normal activity Orientation and Clouding of Sensorium: oriented and can do serial additions CIWA-Ar Total: 9 COWS:    Allergies: No Known Allergies  Home Medications: (Not in a hospital admission)   OB/GYN Status:  No LMP for male patient.  General Assessment Data Location of Assessment: [facility] ED TTS Assessment: In system Is this a Tele or Face-to-Face Assessment?: Tele Assessment Is  this an Initial Assessment or a Re-assessment for this encounter?: Initial Assessment Patient Accompanied by:: N/A Language Other than English: No Living Arrangements: Homeless/Shelter What gender do you identify as?: Male Marital status: Single Maiden name: Sebek Pregnancy Status: No Living Arrangements: Non-relatives/Friends Can pt return to current living  arrangement?: No Admission Status: Voluntary Is patient capable of signing voluntary admission?: Yes Referral Source: Self/Family/Friend Insurance type: Medicaid     Crisis Care Plan Living Arrangements: Non-relatives/Friends Legal Guardian: (self) Name of Psychiatrist: none Name of Therapist: none  Education Status Is patient currently in school?: No Is the patient employed, unemployed or receiving disability?: Receiving disability income  Risk to self with the past 6 months Suicidal Ideation: Yes-Currently Present Has patient been a risk to self within the past 6 months prior to admission? : Yes Suicidal Intent: Yes-Currently Present Has patient had any suicidal intent within the past 6 months prior to admission? : Yes Is patient at risk for suicide?: Yes Suicidal Plan?: Yes-Currently Present Has patient had any suicidal plan within the past 6 months prior to admission? : Yes Specify Current Suicidal Plan: cut his wrists Access to Means: Yes Specify Access to Suicidal Means: states has access to knife What has been your use of drugs/alcohol within the last 12 months?: crack cocaine and alcohol Previous Attempts/Gestures: Yes How many times?: 1 Other Self Harm Risks: denies Triggers for Past Attempts: None known Intentional Self Injurious Behavior: None Family Suicide History: No Recent stressful life event(s): Conflict (Comment)(with cousin and aunt) Persecutory voices/beliefs?: Yes Depression: Yes Depression Symptoms: Despondent, Insomnia, Tearfulness, Isolating, Fatigue, Guilt, Loss of interest in usual pleasures, Feeling worthless/self pity, Feeling angry/irritable Substance abuse history and/or treatment for substance abuse?: No Suicide prevention information given to non-admitted patients: Not applicable  Risk to Others within the past 6 months Homicidal Ideation: Yes-Currently Present Does patient have any lifetime risk of violence toward others beyond the six  months prior to admission? : No Thoughts of Harm to Others: Yes-Currently Present Comment - Thoughts of Harm to Others: "I want to smell flesh burning" Current Homicidal Intent: Yes-Currently Present Current Homicidal Plan: Yes-Currently Present Describe Current Homicidal Plan: pouring gasoline on aunt's house and burning it down Access to Homicidal Means: Yes Describe Access to Homicidal Means: ability to do so Identified Victim: aunt and cousin History of harm to others?: No Assessment of Violence: None Noted Violent Behavior Description: none noted Does patient have access to weapons?: No Criminal Charges Pending?: No Does patient have a court date: No Is patient on probation?: No  Psychosis Hallucinations: Auditory, Visual Delusions: None noted  Mental Status Report Appearance/Hygiene: Unremarkable Eye Contact: Good Motor Activity: Freedom of movement Speech: Logical/coherent Level of Consciousness: Alert Mood: Pleasant Affect: Appropriate to circumstance Anxiety Level: Minimal Thought Processes: Coherent, Relevant Judgement: Partial Orientation: Person, Place, Time, Situation Obsessive Compulsive Thoughts/Behaviors: None  Cognitive Functioning Concentration: Normal Memory: Recent Intact, Remote Intact Is patient IDD: No Insight: Poor Impulse Control: Poor Appetite: Good Have you had any weight changes? : No Change Sleep: Decreased Total Hours of Sleep: 0 Vegetative Symptoms: None  ADLScreening [facility] Assessment Services) Patient's cognitive ability adequate to safely complete daily activities?: Yes Patient able to express need for assistance with ADLs?: Yes Independently performs ADLs?: Yes (appropriate for developmental age)  Prior Inpatient Therapy Prior Inpatient Therapy: Yes Prior Therapy Dates: (numerous) [facility] Reason for Treatment: schizophrenia  Prior Outpatient Therapy Prior Outpatient Therapy: Yes Prior  Therapy Dates: (UTA) Prior Therapy Facilty/Provider(s): [facility] Reason  for Treatment: med management Does patient have an ACCT team?: No Does patient have Intensive In-House Services?  : No Does patient have [facility] services? : No Does patient have P4CC services?: No  ADL Screening (condition at time of admission) Patient's cognitive ability adequate to safely complete daily activities?: Yes Is the patient deaf or have difficulty hearing?: No Does the patient have difficulty seeing, even when wearing glasses/contacts?: No Does the patient have difficulty concentrating, remembering, or making decisions?: No Patient able to express need for assistance with ADLs?: Yes Does the patient have difficulty dressing or bathing?: No Independently performs ADLs?: Yes (appropriate for developmental age) Does the patient have difficulty walking or climbing stairs?: No Weakness of Legs: None Weakness of Arms/Hands: None  Home Assistive Devices/Equipment Home Assistive Devices/Equipment: None  Therapy Consults (therapy consults require a physician order) PT Evaluation Needed: No OT Evalulation Needed: No SLP Evaluation Needed: No Abuse/Neglect Assessment (Assessment to be complete while patient is alone) Abuse/Neglect Assessment Can Be Completed: Yes Physical Abuse: Denies Verbal Abuse: Denies Sexual Abuse: Denies Exploitation of patient/patient's resources: Denies Self-Neglect: Denies Values / Beliefs Cultural Requests During Hospitalization: None Spiritual Requests During Hospitalization: None Consults Spiritual Care Consult Needed: No Social Work Consult Needed: No Regulatory affairs officer (For Healthcare) Does Patient Have a Medical Advance Directive?: No Would patient like information on creating a medical advance directive?: No - Patient declined          Disposition: Mordecai Maes, FNP recommends patient be brought to [facility] Obs unit to be observed overnight for safety and  stabilization. Patient is accepted to [facility] observation unit pending medical clearance (labs and covid test). Disposition Initial Assessment Completed for this Encounter: Yes  This service was provided via telemedicine using a 2-way, interactive audio and video technology.  Names of all persons participating in this telemedicine service and their role in this encounter. Name: Orvis Brill, LCSW Role: TTS  Name: [name] Role: patient  Name:  Role:   Name:  Role:     Orvis Brill 07/12/2019 3:47 PM

## 2019-07-12 NOTE — Progress Notes (Signed)
Patient ID: Bruce [name], male   DOB: 08-19-1976, 43 y.o.   MRN: [id number] Pt A&O x 3, presents with SI, plan to shoot self.  Pt HI towards aunt & cousin after argument.  Pt reports he wants to pur gasoline on their house and burn it down.  Positive AVH, hearing voices telling him to kill himself.  Pt reports seeing people in trees.  Skin search completed, monitoring for safety, no distress noted, calm & cooperative, interactive with staff.

## 2019-07-12 NOTE — ED Triage Notes (Signed)
Pt arrived [place] [facility] from bus station from [facility]. EMS reported pt stated he rolled his right ankle getting off of bus. No deformity noted. Pt c/o of suicidal ideation. No plan suicidal reported.

## 2019-07-12 NOTE — ED Provider Notes (Addendum)
[facility]ENC[facility] Provider Note   CSN: [id number] Arrival date & time: 07/12/19  1413     History   Chief Complaint Chief Complaint  Patient presents with  . Suicidal  . Ankle Pain    HPI Bruce [name] is a 43 y.o. male.     The history is provided by the patient and medical records. No language interpreter was used.  Ankle Pain    43 year old male with history of schizophrenia, alcohol abuse, polysubstance abuse, malingering presenting with report of suicidal ideation.  Patient states for the past month he has had persistent thought about harming himself as well as other.  He mentioned wanting to cut himself as well as burning and killing his family members.  He attributed to verbal argument that we he had with his aunt 2 days ago that aggravates his symptoms.  He also mention having auditory visual hallucination with voices telling him to hurt himself and other people.  He states he is seeing things coming out of the wall and seeing demons.  He admits to using crack, as well as alcohol approximately 6 days ago.  He mentioned self-harm in the past.  He also report missing his psychiatric medication in the injection form approximately 2 months ago when he forgot to come to the hospital for his medication.  He denies any recent sick contact with anyone with COVID-19.  He did not disclose any ankle pain to me.  Past Medical History:  Diagnosis Date  . Depression   . ETOH abuse   . Hypertension   . Obesity   . Schizophrenia (HCC)   . Substance abuse [facility](HCC)     Patient Active Problem List   Diagnosis Date Noted  . Malingering 06/12/2018  . Tobacco use disorder 06/09/2018  . HTN (hypertension) 06/09/2018  . Schizoaffective disorder, depressive type (HCC) 08/04/2017  . 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 Polysubstance abuse (HCC) 02/27/2017  . Substance induced mood disorder (HCC) 02/27/2017  . Cocaine use disorder, severe,  dependence (HCC) 01/05/2017  . 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 Cocaine use disorder, mild, abuse (HCC) 05/03/2016  . Cannabis use disorder, mild, abuse 05/03/2016  . Suicidal ideations 04/30/2016  . Hyperprolactinemia (HCC) 09/24/2015  . Alcohol use disorder, moderate, dependence (HCC) 09/22/2015  . Morbid obesity (HCC) 09/22/2015    No past surgical history on file.      Home Medications    Prior to Admission medications   Medication Sig Start Date End Date Taking? Authorizing Provider  acetaminophen (TYLENOL) 500 MG tablet Take 2,000 mg by mouth as needed for mild pain, moderate pain or headache.     [provider]  amLODipine (NORVASC) 5 MG tablet Take 5 mg by mouth daily. 03/04/19   [provider]    Family History Family History  Problem Relation Age of Onset  . Schizophrenia Mother     Social History Social History   Tobacco Use  . Smoking status: Current Every Day Smoker    Packs/day: 0.50    Types: Cigarettes  . Smokeless tobacco: Never Used  Substance Use Topics  . Alcohol use: Yes    Comment: BAC was clear  . Drug use: Yes    Types: Marijuana, "Crack" cocaine, Cocaine    Comment: +Cocaine     Allergies   Patient has no known allergies.   Review of Systems Review of Systems  All other systems reviewed and are negative.    Physical Exam  Updated Vital Signs BP (!) 151/88 (BP Location: Right Arm)   Pulse 96   Temp 98.4 F (36.9 C) (Oral)   Resp 18   SpO2 96%   Physical Exam Vitals signs and nursing note reviewed.  Constitutional:      General: He is not in acute distress.    Appearance: He is well-developed. He is obese.     Comments: Patient is morbidly obese, resting comfortably in bed in no acute discomfort.  HENT:     Head: Atraumatic.  Eyes:     Conjunctiva/sclera: Conjunctivae normal.  Neck:     Musculoskeletal: Neck supple.  Cardiovascular:     Rate and Rhythm: Normal rate and regular  rhythm.     Pulses: Normal pulses.     Heart sounds: Normal heart sounds.  Pulmonary:     Effort: Pulmonary effort is normal.     Breath sounds: Normal breath sounds.  Musculoskeletal:        General: No tenderness.     Comments: Ambulate without difficulty, no difficulty with ankle ROM bilaterally  Skin:    Findings: No rash.  Neurological:     Mental Status: He is alert and oriented to person, place, and time.     GCS: GCS eye subscore is 4. GCS verbal subscore is 5. GCS motor subscore is 6.  Psychiatric:        Mood and Affect: Mood normal.        Speech: Speech normal.        Behavior: Behavior is cooperative.        Thought Content: Thought content is not paranoid. Thought content includes homicidal and suicidal ideation.      ED Treatments / Results  Labs (all labs ordered are listed, but only abnormal results are displayed) Labs Reviewed  COMPREHENSIVE METABOLIC PANEL - Abnormal; Notable for the following components:      Result Value   Albumin 3.3 (*)    All other components within normal limits  ACETAMINOPHEN LEVEL - Abnormal; Notable for the following components:   Acetaminophen (Tylenol), Serum <10 (*)    All other components within normal limits  CBC - Abnormal; Notable for the following components:   RBC 4.14 (*)    Hemoglobin 12.5 (*)    All other components within normal limits  RAPID URINE DRUG SCREEN, HOSP PERFORMED - Abnormal; Notable for the following components:   Cocaine POSITIVE (*)    All other components within normal limits  SARS CORONAVIRUS 2 (TAT 6-24 HRS)  SARS CORONAVIRUS 2 BY RT PCR (HOSPITAL ORDER, [facility])  ETHANOL  SALICYLATE LEVEL    EKG None  Radiology No results found.  Procedures Procedures (including critical care time)  Medications Ordered in ED Medications  LORazepam (ATIVAN) injection 0-4 mg (has no administration in time range)    Or  LORazepam (ATIVAN) tablet 0-4 mg (has no  administration in time range)  LORazepam (ATIVAN) injection 0-4 mg (has no administration in time range)    Or  LORazepam (ATIVAN) tablet 0-4 mg (has no administration in time range)  thiamine (VITAMIN B-1) tablet 100 mg (100 mg Oral Given 07/12/19 1730)    Or  thiamine (B-1) injection 100 mg ( Intravenous See Alternative 07/12/19 1730)  acetaminophen (TYLENOL) tablet 650 mg (has no administration in time range)  zolpidem (AMBIEN) tablet 5 mg (has no administration in time range)  ondansetron (ZOFRAN) tablet 4 mg (has no administration in time range)  alum &  mag hydroxide-simeth (MAALOX/MYLANTA) 200-200-20 MG/5ML suspension 30 mL (has no administration in time range)  nicotine (NICODERM CQ - dosed in mg/24 hours) patch 21 mg (21 mg Transdermal Patch Applied 07/12/19 1728)  amLODipine (NORVASC) tablet 5 mg (5 mg Oral Given 07/12/19 1729)     Initial Impression / Assessment and Plan / ED Course  I have reviewed the triage vital signs and the nursing notes.  Pertinent labs & imaging results that were available during my care of the patient were reviewed by me and considered in my medical decision making (see chart for details).        BP (!) 154/106   Pulse 96   Temp 98.4 F (36.9 C) (Oral)   Resp 18   SpO2 96%    Final Clinical Impressions(s) / ED Diagnoses   Final diagnoses:  Suicidal ideation    ED Discharge Orders    None     2:40 PM Patient with underlying psychiatric history as well as history of malingering here with report of homicidal and suicidal ideation.  Work-up initiated.  5:45 PM Pt is medically cleared and can be further psychiatrically assess by [facility].     9:10 PM [facility] have evaluated and accepted pt to be admitted for observation over night for mental instability.      Fayrene Helper, PA-C 07/12/19 2111    Little, Ambrose Finland, MD 07/13/19 [phone]

## 2019-07-12 NOTE — ED Notes (Signed)
Attempted to call for report; no answer, will attempt call again in 10-15 minutes.

## 2019-07-12 NOTE — [facility] Counselor (Signed)
Correction: Please call report to [phone].

## 2019-07-12 NOTE — Plan of Care (Signed)
Bruce [name] Observation Crisis Plan  Reason for Crisis Plan:  Crisis Stabilization   Plan of Care:  Referral for Inpatient Hospitalization  Family Support:      Current Living Environment:     Insurance:   Hospital Account    Name Acct ID Class Status Primary Coverage   Bruce [name], Bruce [name] [id number] [place]        Guarantor Account (for Hospital Account [id number])    Name Relation to Pt Service Area Active? Acct Type   Bruce [name] Self [facility] Yes Behavioral Health   Address Phone       [place], [place] [address] [phone](H)          Coverage Information (for Hospital Account [id number])    F/O Payor/Plan Precert #   [employer] [place] #   Bruce [name], Bruce [name] [id number] [facility]   Address Phone   [address] [place], [place] [address] [phone]      Legal Guardian:     Primary Care Provider:  Patient, No Pcp Per  Current Outpatient Providers:  [facility]  Psychiatrist:     Counselor/Therapist:     Compliant with Medications:  No  Additional Information:   Bruce [name] 11/19/202011:22 PM

## 2019-07-12 NOTE — ED Notes (Signed)
Pt given turkey sandwich and coke to drink 

## 2019-07-12 NOTE — [facility] Counselor (Addendum)
Bruce Maes, FNP recommends patient be brought to [facility] Obs unit to be observed overnight for safety and stabilization. Patient is accepted to [facility] observation unit pending medical clearance (labs and covid test).  Will, RN informed of disposition.  Observation unit RN, [place], notified. Please call report to [phone].

## 2019-07-13 DIAGNOSIS — R45851 Suicidal ideations: Secondary | ICD-10-CM | POA: Diagnosis not present

## 2019-07-13 NOTE — Progress Notes (Signed)
   07/13/19 0800  Psych Admission Type (Psych Patients Only)  Admission Status Voluntary  Psychosocial Assessment  Patient Complaints Depression  Eye Contact Fair  Facial Expression Flat  Affect Depressed  Speech Logical/coherent  Interaction Assertive  Motor Activity Other (Comment) (WDL)  Appearance/Hygiene Unremarkable  Behavior Characteristics Cooperative  Mood Depressed  Thought Process  Coherency WDL  Content Blaming others  Delusions None reported or observed  Perception WDL  Hallucination Auditory  Judgment Impaired  Confusion None  Danger to Self  Current suicidal ideation? Plan  Description of Suicide Plan Plan to shoot self  Self-Injurious Behavior Some self-injurious ideation observed or expressed.  No lethal plan expressed   Agreement Not to Harm Self Yes  Description of Agreement verbal contract  Danger to Others  Danger to Others Reported or observed  Danger to Others Abnormal  Harmful Behavior to others Threats of violence towards other people observed or expressed   Destructive Behavior No threats or harm toward property  Description of Harmful Behavior Plan to shoot others   Pt reports that he has taken Abilify in the past that helped with his voices. He said that it stopped working and he has been off medications "for a while." Pt reports he has voices to hurt himself and others. Pt holds eye contact and does not appear to be responding. Pt reports si thoughts and hi thoughts to family members and others that he does not disclose names. Pt says that he has a friend with a "chopper" that he plans to use. He is currently in his room eating breakfast. Pt contracts with staff for safety.

## 2019-07-13 NOTE — [facility] Suicide Risk Assessment (Cosign Needed)
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 rate 16, SpO2 99 %.There is no height or weight on file to calculate BMI.  General Appearance: Casual  Eye Contact::  Good  Speech:  Clear and Coherent and Normal Rate409  Volume:  Normal  Mood:  Depressed  Affect:  Appropriate and Congruent  Thought Process:  Coherent, Goal Directed and Descriptions of Associations: Intact  Orientation:  Full (Time, Place, and Person)  Thought Content:  WDL and Logical  Suicidal Thoughts:  No  Homicidal Thoughts:  No  Memory:  Immediate;   Good Recent;   Good Remote;   Good  Judgement:  Good  Insight:  Good  Psychomotor Activity:  Normal  Concentration:  Good  Recall:  Good  Fund of Knowledge:Good  Language: Fair  Akathisia:  No  Handed:  Right  AIMS (if indicated):     Assets:  Communication Skills Desire for Improvement Financial Resources/Insurance Housing Social Support  Sleep:     Cognition: WNL  ADL's:  Intact   Mental Status Per Nursing Assessment::   On Admission:  Suicide plan, Thoughts of violence towards others    Demographic Factors:  Male  Loss Factors: NA  Historical Factors: Prior suicide attempts  Risk Reduction Factors:   Sense of responsibility to family, Living with another person, especially a relative, Positive social support, Positive therapeutic relationship and Positive coping skills or problem  solving skills  Continued Clinical Symptoms:  Alcohol/Substance Abuse/Dependencies  Cognitive Features That Contribute To Risk:  None    Suicide Risk:  Minimal: No identifiable suicidal ideation.  Patients presenting with no risk factors but with morbid ruminations; may be classified as minimal risk based on the severity of the depressive symptoms    Plan Of Care/Follow-up recommendations:  Other:  Follow up with outpatient psychiatrist  Bruce Kluver, FNP 07/13/2019, 10:47 AM

## 2019-07-13 NOTE — Discharge Instructions (Signed)
For your behavioral health needs, you are advised to follow up with an outpatient provider.  Because your Medicaid benefits originate from [facility], they are managed by [employer].  Contact them at your earliest opportunity to ask about treatment options that they will authorize for you.  Please note that the number listed below also serves as a 24 hour crisis number:       [facility]      [phone]) [phone]

## 2019-07-13 NOTE — Progress Notes (Signed)
Pt being discharged with $3.00 cash for part bus. Pt denies si and hi at this time. He reports that he has talked to family members and plans to stay with them in [place]. When asked about HI thoughts to other family members, he says that he plans to stay away from them.

## 2019-07-13 NOTE — Progress Notes (Signed)
MD and NP came to unit to assess pt and Probation officer attended. Pt reports to them that he does not have a place to stay. MD discussed options of treatment centers and housing. Pt verbally denies si and hi. Per MD he is being discharged today. Pt is requesting a part bus pass.

## 2019-07-13 NOTE — BH Assessment (Signed)
[place] Assessment Progress Note  Per Letitia Libra, FNP, this pt does not require psychiatric hospitalization at this time.  Pt is to be discharged from the [facility] Observation Unit with recommendation to follow up with [facility], LME for the community from which pt originates.  This has been included in pt's discharge instructions.  Pt's nurse, Jan, has been notified.  Jalene Mullet, Industry Triage Specialist [phone]

## 2019-07-13 NOTE — Discharge Summary (Signed)
Physician Discharge Summary Note  Patient:  Bruce [name] is an 43 y.o., male MRN:  [id number] DOB:  1976-07-23 Patient phone:  [phone] (home)  Patient address:   [address] [place] [id number],  Total Time spent with patient: 30 minutes  Date of Admission:  07/12/2019 Date of Discharge: 07/13/2019  Reason for Admission:  Suicidal Ideations  Principal Problem: Cocaine use disorder, mild, abuse (HCC) Discharge Diagnoses: Principal Problem:   Cocaine use disorder, mild, abuse (HCC) Active Problems:   Cocaine-induced psychotic disorder (HCC)   MDD (major depressive disorder)   Past Psychiatric History: Polysubstance Use Disorder, Schizoaffective Disorder. MDD   Past Medical History:  Past Medical History:  Diagnosis Date  . Depression   . ETOH abuse   . Hypertension   . Obesity   . Schizophrenia (HCC)   . Substance abuse (HCC)    History reviewed. No pertinent surgical history. Family History:  Family History  Problem Relation Age of Onset  . Schizophrenia Mother    Family Psychiatric  History: Unknown Social History:  Social History   Substance and Sexual Activity  Alcohol Use Yes   Comment: BAC was clear     Social History   Substance and Sexual Activity  Drug Use Yes  . Types: Marijuana, "Crack" cocaine, Cocaine   Comment: +Cocaine    Social History   Socioeconomic History  . Marital status: Single    Spouse name: Not on file  . Number of children: Not on file  . Years of education: Not on file  . Highest education level: Not on file  Occupational History  . Occupation: On disability  Social Needs  . Financial resource strain: Not on file  . Food insecurity    Worry: Not on file    Inability: Not on file  . Transportation needs    Medical: Not on file    Non-medical: Not on file  Tobacco Use  . Smoking status: Current Every Day Smoker    Packs/day: 0.50    Types: Cigarettes  . Smokeless tobacco: Never Used  Substance and  Sexual Activity  . Alcohol use: Yes    Comment: BAC was clear  . Drug use: Yes    Types: Marijuana, "Crack" cocaine, Cocaine    Comment: +Cocaine  . Sexual activity: Not Currently  Lifestyle  . Physical activity    Days per week: Not on file    Minutes per session: Not on file  . Stress: Not on file  Relationships  . Social Musicianconnections    Talks on phone: Not on file    Gets together: Not on file    Attends religious service: Not on file    Active member of club or organization: Not on file    Attends meetings of clubs or organizations: Not on file    Relationship status: Not on file  Other Topics Concern  . Not on file  Social History Narrative   [place] in [place] with a friend.  He is on disability.  He stated that he is not followed by an outpatient psych provider.    Hospital Course:  Patient admitted for observation, home medications restarted. Patient denies suicidal and homicidal ideations. Patient denies hallucinations. Patient denies access to weapons. Patient plans to [place] of [place]. Patient to follow up with [facility].  Patient seen along with Dr Lucianne MussKumar who recommends discharge.   Physical Findings: AIMS: Facial and Oral Movements Muscles of Facial Expression: None, normal  Lips and Perioral Area: None, normal Jaw: None, normal Tongue: None, normal,Extremity Movements Upper (arms, wrists, hands, fingers): None, normal Lower (legs, knees, ankles, toes): None, normal, Trunk Movements Neck, shoulders, hips: None, normal, Overall Severity Severity of abnormal movements (highest score from questions above): None, normal Incapacitation due to abnormal movements: None, normal Patient's awareness of abnormal movements (rate only patient's report): No Awareness, Dental Status Current problems with teeth and/or dentures?: No Does patient usually wear dentures?: No  CIWA:  CIWA-Ar Total: 0 COWS:  COWS Total Score:  1  Musculoskeletal: Strength & Muscle Tone: within normal limits Gait & Station: normal Patient leans: N/A  Psychiatric Specialty Exam: Physical Exam  ROS  Blood pressure (!) 151/88, pulse 83, temperature 99 F (37.2 C), temperature source Oral, resp. rate 16, SpO2 99 %.There is no height or weight on file to calculate BMI.  General Appearance: Casual  Eye Contact:  Good  Speech:  Clear and Coherent and Normal Rate  Volume:  Normal  Mood:  Depressed  Affect:  Appropriate and Congruent  Thought Process:  Coherent, Goal Directed and Descriptions of Associations: Intact  Orientation:  Full (Time, Place, and Person)  Thought Content:  WDL and Logical  Suicidal Thoughts:  No  Homicidal Thoughts:  No  Memory:  Immediate;   Good Recent;   Good Remote;   Good  Judgement:  Good  Insight:  Fair  Psychomotor Activity:  Normal  Concentration:  Concentration: Good and Attention Span: Good  Recall:  Good  Fund of Knowledge:  Good  Language:  Good  Akathisia:  No  Handed:  Right  AIMS (if indicated):     Assets:  Communication Skills Desire for Improvement Financial Resources/Insurance  ADL's:  Intact  Cognition:  WNL  Sleep:           Has this patient used any form of tobacco in the last 30 days? (Cigarettes, Smokeless Tobacco, Cigars, and/or Pipes) Yes, Yes, A prescription for an FDA-approved tobacco cessation medication was offered at discharge and the patient refused  Blood Alcohol level:  Lab Results  Component Value Date   [facility] <10 07/12/2019   ETH <10 05/02/2019    Metabolic Disorder Labs:  Lab Results  Component Value Date   HGBA1C 5.2 10/12/2016   MPG 103 10/12/2016   MPG 108 09/26/2015   Lab Results  Component Value Date   PROLACTIN 63.9 (H) 10/12/2016   PROLACTIN 19.9 (H) 05/06/2016   Lab Results  Component Value Date   CHOL 148 08/05/2017   TRIG 73 08/05/2017   HDL 40 (L) 08/05/2017   CHOLHDL 3.7 08/05/2017   VLDL 15 08/05/2017   LDLCALC 93  08/05/2017   LDLCALC 88 10/12/2016    See Psychiatric Specialty Exam and Suicide Risk Assessment completed by Attending Physician prior to discharge.  Discharge destination:  Home  Is patient on multiple antipsychotic therapies at discharge:  No   Has Patient had three or more failed trials of antipsychotic monotherapy by history:  No  Recommended Plan for Multiple Antipsychotic Therapies: NA   Allergies as of 07/13/2019   No Known Allergies     Medication List    TAKE these medications     Indication  acetaminophen 500 MG tablet Commonly known as: TYLENOL Take 2,000 mg by mouth as needed for mild pain, moderate pain or headache.  Indication: Pain   amLODipine 5 MG tablet Commonly known as: NORVASC Take 5 mg by mouth daily.  Indication: High Blood Pressure Disorder  Follow-up recommendations:  Other:  Discharge home  Comments:  Take all medications as prescribed. Please attend all follow-up appointments as scheduled. Report any side effects to your outpatient psychiatrist. Abstain from alcohol and illegal drugs while taking prescription medications. In the event of worsening symptoms call the crisis hotline, 911 or go to the nearest emergency department for evaluation and treatment.    Signed: Emmaline Kluver, FNP 07/13/2019, 10:53 AM

## 2020-02-21 ENCOUNTER — Other Ambulatory Visit: Payer: Self-pay

## 2020-02-21 ENCOUNTER — Encounter (HOSPITAL_COMMUNITY): Payer: Self-pay | Admitting: Emergency Medicine

## 2020-02-21 ENCOUNTER — Emergency Department (HOSPITAL_COMMUNITY)
Admission: EM | Admit: 2020-02-21 | Discharge: 2020-02-22 | Disposition: A | Discharge status: Other Acute Inpatient Hospital | Payer: Medicaid Other | Attending: Emergency Medicine | Admitting: Emergency Medicine

## 2020-02-21 ENCOUNTER — Emergency Department (HOSPITAL_COMMUNITY): Payer: Medicaid Other

## 2020-02-21 DIAGNOSIS — F17210 Nicotine dependence, cigarettes, uncomplicated: Secondary | ICD-10-CM | POA: Insufficient documentation

## 2020-02-21 DIAGNOSIS — Z79899 Other long term (current) drug therapy: Secondary | ICD-10-CM | POA: Insufficient documentation

## 2020-02-21 DIAGNOSIS — F1410 Cocaine abuse, uncomplicated: Secondary | ICD-10-CM | POA: Insufficient documentation

## 2020-02-21 DIAGNOSIS — I10 Essential (primary) hypertension: Secondary | ICD-10-CM | POA: Diagnosis not present

## 2020-02-21 DIAGNOSIS — R45851 Suicidal ideations: Secondary | ICD-10-CM | POA: Insufficient documentation

## 2020-02-21 DIAGNOSIS — F251 Schizoaffective disorder, depressive type: Secondary | ICD-10-CM | POA: Diagnosis not present

## 2020-02-21 DIAGNOSIS — Z20822 Contact with and (suspected) exposure to covid-19: Secondary | ICD-10-CM | POA: Insufficient documentation

## 2020-02-21 DIAGNOSIS — R072 Precordial pain: Secondary | ICD-10-CM | POA: Diagnosis not present

## 2020-02-21 DIAGNOSIS — F209 Schizophrenia, unspecified: Secondary | ICD-10-CM

## 2020-02-21 DIAGNOSIS — R0789 Other chest pain: Secondary | ICD-10-CM | POA: Diagnosis present

## 2020-02-21 LAB — TROPONIN I (HIGH SENSITIVITY)
Troponin I (High Sensitivity): 4 ng/L (ref <18)
Troponin I (High Sensitivity): 6 ng/L (ref <18)

## 2020-02-21 LAB — BASIC METABOLIC PANEL
Anion gap: 8 (ref 5–15)
BUN: 8 mg/dL (ref 6–20)
CO2: 30 mmol/L (ref 22–32)
Calcium: 9.2 mg/dL (ref 8.9–10.3)
Chloride: 103 mmol/L (ref 98–111)
Creatinine, Ser: 0.91 mg/dL (ref 0.61–1.24)
GFR calc Af Amer: >60 mL/min (ref >60)
GFR calc non Af Amer: >60 mL/min (ref >60)
Glucose, Bld: 90 mg/dL (ref 70–99)
Potassium: 3.5 mmol/L (ref 3.5–5.1)
Sodium: 141 mmol/L (ref 135–145)

## 2020-02-21 LAB — CBC
HCT: 36.9 % — ABNORMAL LOW (ref 39.0–52.0)
Hemoglobin: 11.1 g/dL — ABNORMAL LOW (ref 13.0–17.0)
MCH: 28.5 pg (ref 26.0–34.0)
MCHC: 30.1 g/dL (ref 30.0–36.0)
MCV: 94.6 fL (ref 80.0–100.0)
Platelets: 334 10*3/uL (ref 150–400)
RBC: 3.9 MIL/uL — ABNORMAL LOW (ref 4.22–5.81)
RDW: 18 % — ABNORMAL HIGH (ref 11.5–15.5)
WBC: 10.2 10*3/uL (ref 4.0–10.5)
nRBC: 0.2 % (ref 0.0–0.2)

## 2020-02-21 MED ORDER — SODIUM CHLORIDE 0.9% FLUSH: 3.0000 mL | Freq: Once | INTRAVENOUS | Status: DC

## 2020-02-21 NOTE — ED Triage Notes (Signed)
Pt arrives via EMS from the bus stop with reports of left sided chest pain since last night at 11. Pain has improved to 8/10 after ASA and 1 nitro. Pt reports using crack last night. Pt requesting a Child psychotherapist. Lower leg swelling.

## 2020-02-22 ENCOUNTER — Ambulatory Visit (HOSPITAL_COMMUNITY)
Admission: EM | Admit: 2020-02-22 | Discharge: 2020-02-23 | Disposition: A | Discharge status: Home / Self Care | Payer: Medicaid Other | Source: Home / Self Care

## 2020-02-22 DIAGNOSIS — F329 Major depressive disorder, single episode, unspecified: Secondary | ICD-10-CM | POA: Insufficient documentation

## 2020-02-22 DIAGNOSIS — E669 Obesity, unspecified: Secondary | ICD-10-CM | POA: Insufficient documentation

## 2020-02-22 DIAGNOSIS — Z79899 Other long term (current) drug therapy: Secondary | ICD-10-CM | POA: Insufficient documentation

## 2020-02-22 DIAGNOSIS — F251 Schizoaffective disorder, depressive type: Secondary | ICD-10-CM

## 2020-02-22 DIAGNOSIS — R45850 Homicidal ideations: Secondary | ICD-10-CM | POA: Insufficient documentation

## 2020-02-22 DIAGNOSIS — Z20822 Contact with and (suspected) exposure to covid-19: Secondary | ICD-10-CM | POA: Insufficient documentation

## 2020-02-22 DIAGNOSIS — F17210 Nicotine dependence, cigarettes, uncomplicated: Secondary | ICD-10-CM | POA: Insufficient documentation

## 2020-02-22 DIAGNOSIS — Z590 Homelessness: Secondary | ICD-10-CM | POA: Insufficient documentation

## 2020-02-22 DIAGNOSIS — R079 Chest pain, unspecified: Secondary | ICD-10-CM | POA: Insufficient documentation

## 2020-02-22 DIAGNOSIS — R45851 Suicidal ideations: Secondary | ICD-10-CM | POA: Insufficient documentation

## 2020-02-22 DIAGNOSIS — I10 Essential (primary) hypertension: Secondary | ICD-10-CM | POA: Insufficient documentation

## 2020-02-22 LAB — URINALYSIS, ROUTINE W REFLEX MICROSCOPIC
Bilirubin Urine: NEGATIVE
Glucose, UA: NEGATIVE mg/dL
Hgb urine dipstick: NEGATIVE
Ketones, ur: NEGATIVE mg/dL
Leukocytes,Ua: NEGATIVE
Nitrite: NEGATIVE
Protein, ur: NEGATIVE mg/dL
Specific Gravity, Urine: 1.026 (ref 1.005–1.030)
pH: 5 (ref 5.0–8.0)

## 2020-02-22 LAB — RAPID URINE DRUG SCREEN, HOSP PERFORMED
Amphetamines: NOT DETECTED
Barbiturates: NOT DETECTED
Benzodiazepines: NOT DETECTED
Cocaine: POSITIVE — AB
Opiates: NOT DETECTED
Tetrahydrocannabinol: NOT DETECTED

## 2020-02-22 LAB — ETHANOL: Alcohol, Ethyl (B): <10 mg/dL (ref <10)

## 2020-02-22 LAB — POC SARS CORONAVIRUS 2 AG: SARS Coronavirus 2 Ag: NEGATIVE

## 2020-02-22 LAB — CK: Total CK: 44 U/L — ABNORMAL LOW (ref 49–397)

## 2020-02-22 MED ORDER — HYDROXYZINE HCL 25 MG PO TABS: 25.0000 mg | ORAL_TABLET | Freq: Three times a day (TID) | ORAL | Status: DC | PRN

## 2020-02-22 MED ORDER — SODIUM BICARBONATE 8.4 % IV SOLN
INTRAVENOUS | Status: AC
  Filled 2020-02-22: qty 50

## 2020-02-22 MED ORDER — ZIPRASIDONE HCL 20 MG PO CAPS
20.0000 mg | ORAL_CAPSULE | Freq: Two times a day (BID) | ORAL | Status: DC
  Administered 2020-02-22 – 2020-02-23 (×2): 20 mg via ORAL
  Filled 2020-02-22 (×2): qty 1

## 2020-02-22 MED ORDER — ACETAMINOPHEN 325 MG PO TABS: 650.0000 mg | ORAL_TABLET | Freq: Four times a day (QID) | ORAL | Status: DC | PRN

## 2020-02-22 MED ORDER — TRAZODONE HCL 50 MG PO TABS: 50.0000 mg | ORAL_TABLET | Freq: Every evening | ORAL | Status: DC | PRN

## 2020-02-22 MED ORDER — MAGNESIUM HYDROXIDE 400 MG/5ML PO SUSP: 30.0000 mL | Freq: Every day | ORAL | Status: DC | PRN

## 2020-02-22 MED ORDER — AMLODIPINE BESYLATE 5 MG PO TABS
5.0000 mg | ORAL_TABLET | Freq: Every day | ORAL | Status: DC
  Administered 2020-02-22 – 2020-02-23 (×2): 5 mg via ORAL
  Filled 2020-02-22 (×2): qty 1

## 2020-02-22 MED ORDER — CALCIUM CHLORIDE 10 % IV SOLN
INTRAVENOUS | Status: AC
  Filled 2020-02-22: qty 20

## 2020-02-22 MED ORDER — ALUM & MAG HYDROXIDE-SIMETH 200-200-20 MG/5ML PO SUSP: 30.0000 mL | ORAL | Status: DC | PRN

## 2020-02-22 NOTE — ED Provider Notes (Signed)
[facility] EMERGENCY DEPARTMENT Provider Note   CSN: [id number] Arrival date & time: 02/21/20  1635     History Chief Complaint  Patient presents with  . Chest Pain    Bruce [name] is a 44 y.o. male.  The history is provided by the patient.  Chest Pain Pain location:  L chest Pain quality: aching   Pain radiates to:  Does not radiate Pain severity:  Moderate Onset quality:  Sudden Timing:  Constant Progression:  Unchanged Chronicity:  New Context: drug use   Relieved by:  Aspirin and nitroglycerin Worsened by:  Nothing Associated symptoms: cough and shortness of breath   Associated symptoms: no fever and no vomiting    Patient with history of depression, obesity, hypertension, schizophrenia presents with chest pain.  It is [place].  He reported chest pain that may be associated to crack cocaine use.  Patient reports he is chronically short of breath particularly with ambulation.  Patient is requesting to see a Child psychotherapist.  Patient also reports he is hearing voices and is having suicidal thoughts    Past Medical History:  Diagnosis Date  . Depression   . ETOH abuse   . Hypertension   . Obesity   . Schizophrenia (HCC)   . Substance abuse [facility])     Patient Active Problem List   Diagnosis Date Noted  . MDD (major depressive disorder) 07/12/2019  . Malingering 06/12/2018  . Tobacco use disorder 06/09/2018  . HTN (hypertension) 06/09/2018  . Schizoaffective disorder, depressive type (HCC) 08/04/2017  . 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 Polysubstance abuse (HCC) 02/27/2017  . Substance induced mood disorder (HCC) 02/27/2017  . Cocaine use disorder, severe, dependence (HCC) 01/05/2017  . 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 Cocaine use disorder, mild, abuse (HCC) 05/03/2016  . Cannabis use disorder, mild, abuse 05/03/2016  . Suicidal ideations 04/30/2016  .  Hyperprolactinemia (HCC) 09/24/2015  . Alcohol use disorder, moderate, dependence (HCC) 09/22/2015  . Morbid obesity (HCC) 09/22/2015    History reviewed. No pertinent surgical history.     Family History  Problem Relation Age of Onset  . Schizophrenia Mother     Social History   Tobacco Use  . Smoking status: Current Every Day Smoker    Packs/day: 0.50    Types: Cigarettes  . Smokeless tobacco: Never Used  Substance Use Topics  . Alcohol use: Yes    Comment: BAC was clear  . Drug use: Yes    Types: Marijuana, "Crack" cocaine, Cocaine    Comment: +Cocaine    Home Medications Prior to Admission medications   Medication Sig Start Date End Date Taking? Authorizing Provider  acetaminophen (TYLENOL) 500 MG tablet Take 2,000 mg by mouth as needed for mild pain, moderate pain or headache.     [provider]  amLODipine (NORVASC) 5 MG tablet Take 5 mg by mouth daily. 03/04/19   [provider]    Allergies    Patient has no known allergies.  Review of Systems   Review of Systems  Constitutional: Negative for fever.  Respiratory: Positive for cough and shortness of breath.   Cardiovascular: Positive for chest pain and leg swelling.  Gastrointestinal: Negative for vomiting.  Genitourinary:       Discolored urine  Psychiatric/Behavioral: Positive for hallucinations and suicidal ideas.  All other systems reviewed and are negative.   Physical Exam Updated Vital Signs BP (!) 152/90 (BP Location: Right Arm)  Pulse 80   Temp 98.4 F (36.9 C) (Oral)   Resp (!) 25   Ht 1.727 m (5\' 8" )   Wt (!) 190.5 kg   SpO2 100%   BMI 63.86 kg/m   Physical Exam CONSTITUTIONAL: Well developed/well nourished HEAD: Normocephalic/atraumatic EYES: EOMI/PERRL ENMT: Mucous membranes moist NECK: supple no meningeal signs SPINE/BACK:entire spine nontender CV: S1/S2 noted, no murmurs/rubs/gallops noted LUNGS: Lungs are clear to auscultation bilaterally, no apparent  distress Chest-left-sided chest wall tenderness, no crepitus ABDOMEN: soft, nontender, no rebound or guarding, bowel sounds noted throughout abdomen, obese GU:no cva tenderness NEURO: Pt is awake/alert/appropriate, moves all extremitiesx4.  No facial droop.  EXTREMITIES: pulses normal/equal, full ROM, chronic lower extremity edema SKIN: warm, color normal PSYCH: no abnormalities of mood noted, alert and oriented to situation  ED Results / Procedures / Treatments   Labs (all labs ordered are listed, but only abnormal results are displayed) Labs Reviewed  CBC - Abnormal; Notable for the following components:      Result Value   RBC 3.90 (*)    Hemoglobin 11.1 (*)    HCT 36.9 (*)    RDW 18.0 (*)    All other components within normal limits  RAPID URINE DRUG SCREEN, HOSP PERFORMED - Abnormal; Notable for the following components:   Cocaine POSITIVE (*)    All other components within normal limits  URINALYSIS, ROUTINE W REFLEX MICROSCOPIC - Abnormal; Notable for the following components:   Color, Urine AMBER (*)    All other components within normal limits  CK - Abnormal; Notable for the following components:   Total CK 44 (*)    All other components within normal limits  BASIC METABOLIC PANEL  ETHANOL  TROPONIN I (HIGH SENSITIVITY)  TROPONIN I (HIGH SENSITIVITY)    EKG EKG Interpretation  Date/Time:  Friday February 22 2020 01:36:18 EDT Ventricular Rate:  74 PR Interval:  144 QRS Duration: 91 QT Interval:  393 QTC Calculation: 436 R Axis:   78 Text Interpretation: Sinus rhythm No significant change since last tracing Confirmed by 03-02-2005 (Zadie Rhine) on 02/22/2020 1:42:47 AM   Radiology DG Chest 2 View  Result Date: 02/21/2020 CLINICAL DATA:  Chest pain and shortness of breath EXAM: CHEST - 2 VIEW COMPARISON:  12/01/2018 FINDINGS: The heart size and mediastinal contours are within normal limits. Both lungs are clear. The visualized skeletal structures are unremarkable.  IMPRESSION: No active cardiopulmonary disease. Electronically Signed   By: 01/31/2019 M.D.   On: 02/21/2020 17:58    Procedures Procedures  Medications Ordered in ED Medications  sodium chloride flush (NS) 0.9 % injection 3 mL (3 mLs Intravenous Not Given 02/22/20 0128)    ED Course  I have reviewed the triage vital signs and the nursing notes.  Pertinent labs & imaging results that were available during my care of the patient were reviewed by me and considered in my medical decision making (see chart for details).    MDM Rules/Calculators/A&P                          2:34 AM Patient presented for chest pain.  This appeared to be related to drug abuse.  He reports he was self-medicating due to leg pain.  Patient has had 2 negative  troponins and no acute EKG changes.  His chest x-ray is negative.. I have low suspicion for ACS/PE/dissection at this time  Patient is also requesting food, 04/24/20, as well as reporting  suicidal thoughts Patient tells me that he was recently admitted to [facility] and has been staying in an assisted living in [place].  He decided to check out on June 28 and has no place to stay It appears patient has chronic respiratory failure and is supposed to be on oxygen per recent admission to [facility] 4:01 AM Labs are reassuring. Patient is medically stable.  Will consult psychiatry BP (!) 126/91   Pulse 67   Temp 98.3 F (36.8 C) (Oral)   Resp 18   Ht 1.727 m (5\' 8" )   Wt (!) 190.5 kg   SpO2 99%   BMI 63.86 kg/m    The patient has been placed in psychiatric observation due to the need to provide a safe environment for the patient while obtaining psychiatric consultation and evaluation, as well as ongoing medical and medication management to treat the patient's condition.  The patient has not been placed under full IVC at this time.  Final Clinical Impression(s) / ED Diagnoses Final diagnoses:  Precordial pain  Schizophrenia,  unspecified type (HCC)  Suicidal thoughts  Cocaine abuse [facility])    Rx / DC Orders ED Discharge Orders    None       [facility], MD 02/22/20 [phone]

## 2020-02-22 NOTE — ED Notes (Signed)
Pt A&O x 4, sleeping at present, sitting up at bedside.  Sonorous respirations noted,  Skin color good, no resp. Distress noted.  Pt calm & cooperative.  Remains SI.  Monitoring for safety.

## 2020-02-22 NOTE — ED Notes (Signed)
Breakfast Ordered 

## 2020-02-22 NOTE — ED Notes (Signed)
On phone. Appropriate actions. Engaging with staff cooperatively
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
## 2020-02-22 NOTE — BH Assessment (Signed)
Clinician attempted to request pt be moved to a room and the Tele-Assessment machine be placed in pt's room to complete pt's BH Assessment, but pt's nurse was assisting with a critical. TTS will attempt to make contact at a later time to complete pt's BH Assessment.

## 2020-02-22 NOTE — ED Notes (Signed)
Patient is using phone engaged with staff cooperative

## 2020-02-22 NOTE — ED Notes (Signed)
Pt took meds without issues. Appropriate interaction with staff and other pts. Monitor for safety on unit.
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
## 2020-02-22 NOTE — ED Notes (Signed)
Patient sleep sitting up, very cooperative, stable.

## 2020-02-22 NOTE — ED Notes (Signed)
Pt given sandwich and water.

## 2020-02-23 NOTE — ED Notes (Signed)
Pt remains sitting up at bedside sleeping, sonorous respirations noted.  Skin color good, no distress noted.  Monitoring for safety.

## 2020-02-23 NOTE — ED Notes (Signed)
Pt sitting up at bedside resting at present, no distress noted, monitoring for safety.

## 2020-02-23 NOTE — ED Notes (Signed)
Pt in shower  Pt ate breakfast Pt received meds Pt stable

## 2020-02-23 NOTE — ED Notes (Signed)
Pt alert and oriented on the unit and denies SI/HI, A/VH.Pt ate breakfast and took a shower. Pt is pleasant and cooperative on the unit. Education, support and encouragement provided. Medications administered per Provider orders. No reactions/side effects to medicine noted. Pt denies any concerns at this time and verbally contracts for safety.Pt ambulating on the unit with no issues. Pt remains safe on the unit.

## 2020-02-23 NOTE — ED Notes (Signed)
Pt in shower.  
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
## 2020-02-23 NOTE — Discharge Instructions (Signed)
Here are some resources for housing/adult group homes   HOUSING FOR MENTALLY ILL CONSUMERS [facility] Rachel Moulds, [place] Portal Coordinator/Guardianship Representative for [facility] [address] (first floor) [place], [place]. [address] [phone]. For emergencies only, leave a message with Josephina Gip at [phone]. Mr. Abran Cantor receives calls from group homes with vacancies. He never refers  caregivers/callers to a home until he has personally inspected the facility.   Department of Social Services [facility] [address] [place], [place] [address] [phone] (request Adult Care Unit) Web site: CoalLocator.es This agency has a listing of all group homes, nursing homes, assisted living facilities, and adult care homes.  [facility]  [address]  [place], [place] [address] Disability rights services that help persons with disabilities to reach goals that lead to  greater independence and self-sufficiency. [phone]   [employer] [address] [place], [place] [address] [phone] Ext. [phone] Fax: [phone] Website: caramore.org  Liz Malady is a Research scientist (physical sciences) for mental health consumers who are stabilized on their  medication and want to live on their own and to hold a job. They will learn a trade in either landscaping work or housekeeping. Free to the patient). [place] Department of Health and [employer], Customer Service Center [phone] or [phone] TTY www.dhhs.collegescenetv.com  [place], [place] [address] [place], [place] [address] [phone] Excellent sheltered living for the mentally handicapped. Contact Alphonse Guild, NAMI Mental Health Professional of the Year in 2011.  [facility] [address] [place], [place] [address] [phone] Capacity: 12 beds for adults, male or  male Caregiver: Marylu Lund Reads who lives in this facility. She has 25 years of experience in caregiving. Accepts no violent patients. Highly recommended by NAMI caller whose  adult sister (bipolar/schizoaffective) happily resides here.  [employer] of [place]: There are 146 of these homes in [place] for  both men and women recovering from alcohol and drug addiction. [facility]  contact person is Mohammed Kindle at [phone]. Web address:  johnfox@oxfordhuse .org. A second contact person is [employer] of [place], [phone]. Web site: misty.wilkins@oxfordhouse .org.   [address]  [address] [place], [place] [address]  Residential home for mentally/physically disabled/independent living [phone]  Group Homes in [place]: [place] [facility] [place] [facility] [phone] These homes are for adults only--one for all male residents. Second home, coed, [phone]. These facilities are for the mentally ill.  [facility] [address] [place], [place] [address]  [phone] (adults only/male and male) Activities include dining out, shopping trips, [employer], bowling, etc.Housing  [address] [place], [place] [address] [place], [place] [address] [phone] Psychiatrist visits patient at the facility. Everything is in-house. Clubhouse, physical  therapy. Age 43 and up.   [facility] [place], [place]: 6 male bed, 6 male beds. Everyone  works in these homes. Transportation is provided. Skilled workshop with training. [phone] [facility] [phone] Betha Loa!.  [facility] [address] [place], [place] [address] [phone] (NAMI member has an adult daughter who is successfully living in this facility)  [facility] [address] [place], [place] [address].  [phone]. This includes mental health and  substance abuse treatment for ages  43 and up.  [facility] [place], [place],  [place]: Residential, therapeutic  and rehabilitative community for people with brain and emotional disorders. Residents  may work in [employer], fields, or workshop. Program works on  the total person. There is a psychiatrist, psychologist and nurse practitioner on staff.  [phone] This facility takes patients age 43 and over.  [facility] [address] [place], [place] [address] [phone] This assisted living facility has spacious grounds, private or semiprivate  rooms, RN on call, 8 medicine checks daily. Adults only.  [employer] [address]  [place], [address]  [phone].  This is a locked facility for adult males, although some are permitted to come and go  [employer] [address] [place], [place] [address]  Daytime psychiatric program to facilitate the recovery and rehabilitaton of adults with  serious mental illness. Weekday hours from 8:00 a.m. to 4:00 p.m., no weekends. For  additional information call Cherie Ouch, Director, [phone]  Day care for adults: [facility], [place], [place]: Centers located  at [employer], [address], [address] and [employer]. Additionally, there are  two respite centers that are open from 10:00 a.m. to 2:00 p.m. on Monday, Tuesday,  Thursday and Friday. [phone] (central number to enroll)  Mental Health Association in [facility] [address] [place], [place]. [address] [phone] This facility maintains an ever-changing list of group homes. Web site: PhotoSolver.pl  [facility] [address] [place], [place] [address] [phone] Fax: [phone] E-mail: support@caringservices .org Transitional housing, supportive living (1-2 bedroom apartments, a halfway house plus  more for individuals with  additions or dual diagnoses, both mental and addiction.  [facility], a daytime program (housing part of the Child psychotherapist plan) [address] [place], [place] [address] [phone]    SEARCHING FOR RENTAL HOUSE OR APARTMENT [facility] Housing Coalition (self-help center) [address] [place], [place] [address] [phone] Fax: [phone]  E-mail: support@caringservices .org Housing Hotline: 580 821 2226; [phone] Hours: 8:30 a.m. to 2:00 p.m. Monday through Friday Check internet under [employer] site:  www.FatMenus.com.au.  Bring your ID and Social Security card with you for the [facility] office.  Housing - Section 8 Shelter [facility] Authority [address] [place], [place] [address] [phone] [employer] [facility] sites This office provides a list of other subsidized housing in [place], although they do  not in phone book. Enquire also about subsidized apartments at this address or call [phone] at [address]. Checking with this office 01-11-13 revealed that enrollment is closed pending approval of HUD funding. Housing for the Mentally Ill Web Search: http://facility-services.state.[place].us Go to Directory of Regulated Facilities (Reports of Facilities & Services) (PDF) Go to Mental Health Facility Listing by [place] and hit "View" Advance through pages to county of your choice.  [facility] [address] [place], [place] [address] [phone] (A community for formerly homeless individuals and families working toward  independence and permanent housing).  [facility] Housing Coordinator [address] [place], [place] [address] AttnMerian Capron [phone] ext. [phone] office Fax: [phone] Web site: www.partnersendinghomelessness.org This office offered referral to another housing center at [phone].  Burnett Harry  [facility] Residential Options for Substance Abusers) [address] [place], [place] [address] [phone] This organization specializes in substance abuse but has patients with mental illness diagnoses, as well. Applicant must come in for an interview.

## 2020-02-23 NOTE — ED Notes (Signed)
Ready for discharge

## 2020-02-23 NOTE — ED Notes (Signed)
Pt alert and oriented on the unit. Education, support, and encouragement provided. Discharge summary, medications and follow up appointments reviewed with pt. Suicide prevention resources provided and pt's belongings in locker returned. Pt denies SI/HI, A/VH, pain, or any concerns at this time. Pt ambulatory on and off unit. Pt discharged to lobby. 

## 2020-02-26 ENCOUNTER — Encounter (HOSPITAL_COMMUNITY): Payer: Self-pay | Admitting: Emergency Medicine

## 2020-02-26 ENCOUNTER — Emergency Department (HOSPITAL_COMMUNITY)
Admission: EM | Admit: 2020-02-26 | Discharge: 2020-02-26 | Disposition: A | Discharge status: Home / Self Care | Payer: Medicaid Other | Attending: Emergency Medicine | Admitting: Emergency Medicine

## 2020-02-26 ENCOUNTER — Emergency Department (HOSPITAL_COMMUNITY): Payer: Medicaid Other

## 2020-02-26 ENCOUNTER — Other Ambulatory Visit: Payer: Self-pay

## 2020-02-26 DIAGNOSIS — I10 Essential (primary) hypertension: Secondary | ICD-10-CM | POA: Insufficient documentation

## 2020-02-26 DIAGNOSIS — F17210 Nicotine dependence, cigarettes, uncomplicated: Secondary | ICD-10-CM | POA: Diagnosis not present

## 2020-02-26 DIAGNOSIS — R109 Unspecified abdominal pain: Secondary | ICD-10-CM | POA: Diagnosis present

## 2020-02-26 DIAGNOSIS — R45850 Homicidal ideations: Secondary | ICD-10-CM

## 2020-02-26 DIAGNOSIS — Z20822 Contact with and (suspected) exposure to covid-19: Secondary | ICD-10-CM | POA: Insufficient documentation

## 2020-02-26 DIAGNOSIS — R1084 Generalized abdominal pain: Secondary | ICD-10-CM | POA: Diagnosis not present

## 2020-02-26 DIAGNOSIS — F251 Schizoaffective disorder, depressive type: Secondary | ICD-10-CM | POA: Diagnosis not present

## 2020-02-26 DIAGNOSIS — R45851 Suicidal ideations: Secondary | ICD-10-CM

## 2020-02-26 DIAGNOSIS — F1494 Cocaine use, unspecified with cocaine-induced mood disorder: Secondary | ICD-10-CM | POA: Insufficient documentation

## 2020-02-26 LAB — LIPASE, BLOOD: Lipase: 22 U/L (ref 11–51)

## 2020-02-26 LAB — COMPREHENSIVE METABOLIC PANEL
ALT: 17 U/L (ref 0–44)
AST: 19 U/L (ref 15–41)
Albumin: 3.7 g/dL (ref 3.5–5.0)
Alkaline Phosphatase: 61 U/L (ref 38–126)
Anion gap: 9 (ref 5–15)
BUN: 15 mg/dL (ref 6–20)
CO2: 30 mmol/L (ref 22–32)
Calcium: 9.3 mg/dL (ref 8.9–10.3)
Chloride: 101 mmol/L (ref 98–111)
Creatinine, Ser: 1.06 mg/dL (ref 0.61–1.24)
GFR calc Af Amer: >60 mL/min (ref >60)
GFR calc non Af Amer: >60 mL/min (ref >60)
Glucose, Bld: 94 mg/dL (ref 70–99)
Potassium: 4.3 mmol/L (ref 3.5–5.1)
Sodium: 140 mmol/L (ref 135–145)
Total Bilirubin: 0.5 mg/dL (ref 0.3–1.2)
Total Protein: 8 g/dL (ref 6.5–8.1)

## 2020-02-26 LAB — CBC WITH DIFFERENTIAL/PLATELET
Abs Immature Granulocytes: 0.03 10*3/uL (ref 0.00–0.07)
Basophils Absolute: 0 10*3/uL (ref 0.0–0.1)
Basophils Relative: 0 %
Eosinophils Absolute: 0.2 10*3/uL (ref 0.0–0.5)
Eosinophils Relative: 2 %
HCT: 37.4 % — ABNORMAL LOW (ref 39.0–52.0)
Hemoglobin: 11.5 g/dL — ABNORMAL LOW (ref 13.0–17.0)
Immature Granulocytes: 0 %
Lymphocytes Relative: 19 %
Lymphs Abs: 1.7 10*3/uL (ref 0.7–4.0)
MCH: 28.9 pg (ref 26.0–34.0)
MCHC: 30.7 g/dL (ref 30.0–36.0)
MCV: 94 fL (ref 80.0–100.0)
Monocytes Absolute: 0.4 10*3/uL (ref 0.1–1.0)
Monocytes Relative: 4 %
Neutro Abs: 6.5 10*3/uL (ref 1.7–7.7)
Neutrophils Relative %: 75 %
Platelets: 259 10*3/uL (ref 150–400)
RBC: 3.98 MIL/uL — ABNORMAL LOW (ref 4.22–5.81)
RDW: 17.8 % — ABNORMAL HIGH (ref 11.5–15.5)
WBC: 8.7 10*3/uL (ref 4.0–10.5)
nRBC: 0 % (ref 0.0–0.2)

## 2020-02-26 LAB — RAPID URINE DRUG SCREEN, HOSP PERFORMED
Amphetamines: NOT DETECTED
Barbiturates: NOT DETECTED
Benzodiazepines: NOT DETECTED
Cocaine: POSITIVE — AB
Opiates: NOT DETECTED
Tetrahydrocannabinol: NOT DETECTED

## 2020-02-26 LAB — ETHANOL: Alcohol, Ethyl (B): <10 mg/dL (ref <10)

## 2020-02-26 LAB — ACETAMINOPHEN LEVEL: Acetaminophen (Tylenol), Serum: <10 ug/mL — ABNORMAL LOW (ref 10–30)

## 2020-02-26 LAB — SARS CORONAVIRUS 2 BY RT PCR (HOSPITAL ORDER, PERFORMED IN ~~LOC~~ HOSPITAL LAB): SARS Coronavirus 2: NEGATIVE

## 2020-02-26 LAB — SALICYLATE LEVEL: Salicylate Lvl: <7 mg/dL — ABNORMAL LOW (ref 7.0–30.0)

## 2020-02-26 MED ORDER — IOHEXOL 300 MG/ML  SOLN
100.0000 mL | Freq: Once | INTRAMUSCULAR | Status: AC | PRN
  Administered 2020-02-26: 100 mL via INTRAVENOUS

## 2020-02-26 MED ORDER — SODIUM CHLORIDE (PF) 0.9 % IJ SOLN
INTRAMUSCULAR | Status: AC
  Filled 2020-02-26: qty 50

## 2020-02-26 NOTE — BH Assessment (Signed)
Comprehensive Clinical Assessment (CCA) Screening, Triage and Referral Note  02/26/2020 Bruce [name] [id number]   Patient is a 44 year old male presenting voluntarily to [facility] ED complaining of SI/HI/AVH. Patient well known to [facility] service line as well as the ED. Patient reports ongoing AH and states "I can't take it anymore." He states they are telling him to "do things I don't want to." He reports ongoing VH of "demons" for "awhile." He endorses SI with a plan to overdose on Trazedone secondary to AVH and homelessness. Patient indicated he was living in an assisted living facility in [place] but recently left because "they treated the staff like shit." He states he would like to find another assisted living to go to. Patient also states he would like to start on a long acting injection for schizophrenia. He states in the past he has used Haldol and Abilify- which he states did not work for him. He reports that he does not have an outpatient provider. When asked about HI he states that he has access to a tire iron and "I don't know what I'll do with it when I get out of here." Patient states he has a history of substance use and last used crack cocaine 2 days ago. Patient states he thinks going in patient would be helpful to get on his medications. He is also requesting a Child psychotherapist assist him with housing.  Patient is alert and oriented x 4. He is dressed in scrubs, laying in hospital bed. His speech is logical, eye contact is good, and thoughts are organized. His mood is pleasant and his affect is congruent. He is forward thinking and goal directed. He has limited insight, judgement, and impulse control. He does not appear to be responding to internal stimuli or experiencing delusional thought content.   Per Malachy Chamber, PMHNP patient does not meet in patient criteria. She has placed a consult to CPSS and TOC to assist. Janice Coffin, MA to set appointment with [facility] for med management.  Visit  Diagnosis: Schizophrenia (per history)    Cocaine use disorder, severe  Patient Reported Information How did you hear about [place]? Self   Referral name: No data recorded  Referral phone number: No data recorded Whom do you see for routine medical problems? I don't have a doctor   Practice/Facility Name: Varies depending on where pt is at the time.   Practice/Facility Phone Number: No data recorded  Name of Contact: No data recorded  Contact Number: No data recorded  Contact Fax Number: No data recorded  Prescriber Name: N/A   Prescriber Address (if known): N/A  What Is the Reason for Your Visit/Call Today? Pt is experiencing chest pain, hearing voices, and experiencing SI  How Long Has This Been Causing You Problems? > than 6 months  Have You Recently Been in Any Inpatient Treatment (Hospital/Detox/Crisis Center/28-Day Program)? No   Name/Location of Program/Hospital:No data recorded  How Long Were You There? No data recorded  When Were You Discharged? No data recorded Have You Ever Received Services From [facility] Before? Yes   Who Do You See at [facility]? [place]  Have You Recently Had Any Thoughts About Hurting Yourself? Yes   Are You Planning to Commit Suicide/Harm Yourself At This time?  Yes  Have you Recently Had Thoughts About Hurting Someone Karolee Ohs? Yes   Explanation: States he has access to a tire iron and "I don't know what I will do with it."  Have You Used  Any Alcohol or Drugs in the Past 24 Hours? Yes   How Long Ago Did You Use Drugs or Alcohol?  No data recorded  What Did You Use and How Much? UTA  What Do You Feel Would Help You the Most Today? Medication  Do You Currently Have a Therapist/Psychiatrist? No   Name of Therapist/Psychiatrist: No data recorded  Have You Been Recently Discharged From Any Office Practice or Programs? No   Explanation of Discharge From Practice/Program:  No data recorded    CCA Screening Triage Referral Assessment Type  of Contact: Tele-Assessment   Is this Initial or Reassessment? Initial Assessment   Date Telepsych consult ordered in [facility]:  02/26/20   Time Telepsych consult ordered in [facility]:  0400  Patient Reported Information Reviewed? Yes   Patient Left Without Being Seen? No data recorded  Reason for Not Completing Assessment: No data recorded Collateral Involvement: N/A  Does Patient Have a Court Appointed Legal Guardian? No data recorded  Name and Contact of Legal Guardian:  No data recorded If Minor and Not Living with Parent(s), Who has Custody? No data recorded Is CPS involved or ever been involved? Never  Is APS involved or ever been involved? Never  Patient Determined To Be At Risk for Harm To Self or Others Based on Review of Patient Reported Information or Presenting Complaint? No   Method: Plan with intent and identified person   Availability of Means: In hand or used   Intent: Clearly intends on inflicting harm that could cause death   Notification Required: Identifiable person is aware   Additional Information for Danger to Others Potential:  Previous attempts   Additional Comments for Danger to Others Potential:  No data recorded  Are There Guns or Other Weapons in Your Home?  No    Types of Guns/Weapons: No data recorded   Are These Weapons Safely Secured?                              No data recorded   Who Could Verify You Are Able To Have These Secured:    No data recorded Do You Have any Outstanding Charges, Pending Court Dates, Parole/Probation? None  Contacted To Inform of Risk of Harm To Self or Others: No data recorded Location of Assessment: [facility]  Does Patient Present under Involuntary Commitment? No   IVC Papers Initial File Date: No data recorded  [place] of Residence: [place]  Patient Currently Receiving the Following Services: Not Receiving Services   Determination of Need: Routine (7 days)   Options For Referral: Medication Management;Outpatient  Therapy;ALF/SNF   Celedonio Miyamoto, LCSW         Comprehensive Clinical Assessment (CCA) Screening, Triage and Referral Note  02/26/2020 Miken Stecher [id number]  Visit Diagnosis: No diagnosis found.  Patient Reported Information How did you hear about [place]? Self   Referral name: No data recorded  Referral phone number: No data recorded Whom do you see for routine medical problems? I don't have a doctor   Practice/Facility Name: Varies depending on where pt is at the time.   Practice/Facility Phone Number: No data recorded  Name of Contact: No data recorded  Contact Number: No data recorded  Contact Fax Number: No data recorded  Prescriber Name: N/A   Prescriber Address (if known): N/A  What Is the Reason for Your Visit/Call Today? Pt is experiencing chest pain, hearing voices, and experiencing SI  How  Long Has This Been Causing You Problems? > than 6 months  Have You Recently Been in Any Inpatient Treatment (Hospital/Detox/Crisis Center/28-Day Program)? No   Name/Location of Program/Hospital:No data recorded  How Long Were You There? No data recorded  When Were You Discharged? No data recorded Have You Ever Received Services From [facility] Before? Yes   Who Do You See at [facility]? [place]  Have You Recently Had Any Thoughts About Hurting Yourself? Yes   Are You Planning to Commit Suicide/Harm Yourself At This time?  Yes  Have you Recently Had Thoughts About Hurting Someone Karolee Ohs? Yes   Explanation: States he has access to a tire iron and "I don't know what I will do with it."  Have You Used Any Alcohol or Drugs in the Past 24 Hours? Yes   How Long Ago Did You Use Drugs or Alcohol?  No data recorded  What Did You Use and How Much? UTA  What Do You Feel Would Help You the Most Today? Medication  Do You Currently Have a Therapist/Psychiatrist? No   Name of Therapist/Psychiatrist: No data recorded  Have You Been Recently Discharged From Any Office  Practice or Programs? No   Explanation of Discharge From Practice/Program:  No data recorded    CCA Screening Triage Referral Assessment Type of Contact: Tele-Assessment   Is this Initial or Reassessment? Initial Assessment   Date Telepsych consult ordered in [facility]:  02/26/20   Time Telepsych consult ordered in [facility]:  0400  Patient Reported Information Reviewed? Yes   Patient Left Without Being Seen? No data recorded  Reason for Not Completing Assessment: No data recorded Collateral Involvement: N/A  Does Patient Have a Court Appointed Legal Guardian? No data recorded  Name and Contact of Legal Guardian:  No data recorded If Minor and Not Living with Parent(s), Who has Custody? No data recorded Is CPS involved or ever been involved? Never  Is APS involved or ever been involved? Never  Patient Determined To Be At Risk for Harm To Self or Others Based on Review of Patient Reported Information or Presenting Complaint? No   Method: Plan with intent and identified person   Availability of Means: In hand or used   Intent: Clearly intends on inflicting harm that could cause death   Notification Required: Identifiable person is aware   Additional Information for Danger to Others Potential:  Previous attempts   Additional Comments for Danger to Others Potential:  No data recorded  Are There Guns or Other Weapons in Your Home?  No    Types of Guns/Weapons: No data recorded   Are These Weapons Safely Secured?                              No data recorded   Who Could Verify You Are Able To Have These Secured:    No data recorded Do You Have any Outstanding Charges, Pending Court Dates, Parole/Probation? None  Contacted To Inform of Risk of Harm To Self or Others: No data recorded Location of [facility]: [facility]  Does Patient Present under Involuntary Commitment? No   IVC Papers Initial File Date: No data recorded  [place] of Residence: [place]  Patient Currently Receiving the  Following Services: Not Receiving Services   Determination of Need: Routine (7 days)   Options For Referral: Medication Management;Outpatient Therapy;ALF/SNF   Celedonio Miyamoto, LCSW

## 2020-02-26 NOTE — ED Triage Notes (Signed)
BIB EMS from the homeless shelter, complains of abdominal pain for 'a few days.' And SI/HI.

## 2020-02-26 NOTE — ED Provider Notes (Signed)
[facility]-EMERGENCY DEPT Provider Note   CSN: [id number] Arrival date & time: 02/26/20  0941     History Chief Complaint  Patient presents with  . Medical Clearance  . Abdominal Pain    Bruce [name] is a 44 y.o. male with a past medical history of polysubstance abuse including cocaine and alcohol, schizoaffective disorder, schizophrenia, obesity, hypertension, who presents today for evaluation of multiple complaints.  He reports that his abdomen allover has been hurting since yesterday.  He states he was watching TV when this happened.  He reports that a [facility] and since then has been suicidal with a plan to overdose on pills, reports being homicidal however denies plan.  He denies any fevers.  He denies any diarrhea, nausea, or vomiting.  Chart review shows that patient was seen at the [facility] 3 days ago, was observed overnight and discharged.  At that point his symptoms were felt to be due to his cocaine use.  HPI     Past Medical History:  Diagnosis Date  . Depression   . ETOH abuse   . Hypertension   . Obesity   . Schizophrenia (HCC)   . Substance abuse [facility])     Patient Active Problem List   Diagnosis Date Noted  . MDD (major depressive disorder) 07/12/2019  . Malingering 06/12/2018  . Tobacco use disorder 06/09/2018  . HTN (hypertension) 06/09/2018  . Schizoaffective disorder, depressive type (HCC) 08/04/2017  . 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 Polysubstance abuse (HCC) 02/27/2017  . Substance induced mood disorder (HCC) 02/27/2017  . Cocaine use disorder, severe, dependence (HCC) 01/05/2017  . 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 Cocaine use disorder, mild, abuse (HCC) 05/03/2016  . Cannabis use disorder, mild, abuse 05/03/2016  . Suicidal ideations 04/30/2016  . Hyperprolactinemia (HCC) 09/24/2015  . Alcohol use  disorder, moderate, dependence (HCC) 09/22/2015  . Morbid obesity (HCC) 09/22/2015    History reviewed. No pertinent surgical history.     Family History  Problem Relation Age of Onset  . Schizophrenia Mother     Social History   Tobacco Use  . Smoking status: Current Every Day Smoker    Packs/day: 0.50    Types: Cigarettes  . Smokeless tobacco: Never Used  Substance Use Topics  . Alcohol use: Yes    Comment: Occasional, 2 nights per week  . Drug use: Not Currently    Types: Marijuana, "Crack" cocaine, Cocaine    Comment: +Cocaine    Home Medications Prior to Admission medications   Not on File    Allergies    Patient has no known allergies.  Review of Systems   Review of Systems  Constitutional: Negative for chills and fever.  Eyes: Negative for visual disturbance.  Respiratory: Negative for chest tightness and shortness of breath.   Cardiovascular: Negative for chest pain.  Gastrointestinal: Positive for abdominal pain and nausea. Negative for constipation, diarrhea and vomiting.  Genitourinary: Negative for dysuria and urgency.  Musculoskeletal: Negative for back pain and neck pain.  Psychiatric/Behavioral: Positive for behavioral problems, hallucinations and suicidal ideas. The patient is nervous/anxious.   All other systems reviewed and are negative.   Physical Exam Updated Vital Signs BP (!) 111/99   Pulse 79   Temp 98 F (36.7 C) (Oral)   Resp (!) 22   Ht 5\' 8"  (1.727 m)   Wt (!) 188.2 kg   SpO2 97%  BMI 63.10 kg/m   Physical Exam Vitals and nursing note reviewed.  Constitutional:      General: He is not in acute distress.    Appearance: He is well-developed. He is obese.  HENT:     Head: Normocephalic and atraumatic.  Eyes:     Conjunctiva/sclera: Conjunctivae normal.  Cardiovascular:     Rate and Rhythm: Normal rate and regular rhythm.     Heart sounds: No murmur heard.   Pulmonary:     Effort: Pulmonary effort is normal. No  respiratory distress.     Breath sounds: Normal breath sounds.  Abdominal:     Palpations: Abdomen is soft.     Tenderness: There is generalized abdominal tenderness.  Musculoskeletal:     Cervical back: Neck supple.  Skin:    General: Skin is warm and dry.  Neurological:     Mental Status: He is alert and oriented to person, place, and time.  Psychiatric:        Attention and Perception: Attention and perception normal.        Mood and Affect: Affect is blunt.        Speech: Speech normal.        Behavior: Behavior is cooperative.        Thought Content: Thought content includes homicidal and suicidal ideation. Thought content includes suicidal plan. Thought content does not include homicidal plan.     Comments: Reports AVH, does not appear to be interacting or responding to them.      ED Results / Procedures / Treatments   Labs (all labs ordered are listed, but only abnormal results are displayed) Labs Reviewed  RAPID URINE DRUG SCREEN, HOSP PERFORMED - Abnormal; Notable for the following components:      Result Value   Cocaine POSITIVE (*)    All other components within normal limits  CBC WITH DIFFERENTIAL/PLATELET - Abnormal; Notable for the following components:   RBC 3.98 (*)    Hemoglobin 11.5 (*)    HCT 37.4 (*)    RDW 17.8 (*)    All other components within normal limits  SALICYLATE LEVEL - Abnormal; Notable for the following components:   Salicylate Lvl <7.0 (*)    All other components within normal limits  ACETAMINOPHEN LEVEL - Abnormal; Notable for the following components:   Acetaminophen (Tylenol), Serum <10 (*)    All other components within normal limits  SARS CORONAVIRUS 2 BY RT PCR (HOSPITAL ORDER, PERFORMED IN [facility])  COMPREHENSIVE METABOLIC PANEL  ETHANOL  LIPASE, BLOOD    EKG EKG Interpretation  Date/Time:  Tuesday February 26 2020 10:00:17 EDT Ventricular Rate:  80 PR Interval:    QRS Duration: 93 QT Interval:  397 QTC  Calculation: 458 R Axis:   67 Text Interpretation: Sinus rhythm No significant change since last tracing Confirmed by Melene Plan [phone]) on 02/26/2020 10:02:49 AM   Radiology CT Abdomen Pelvis W Contrast  Result Date: 02/26/2020 CLINICAL DATA:  Acute abdominal pain for few days EXAM: CT ABDOMEN AND PELVIS WITH CONTRAST TECHNIQUE: Multidetector CT imaging of the abdomen and pelvis was performed using the standard protocol following bolus administration of intravenous contrast. CONTRAST:  OMNIPAQUE IOHEXOL 300 MG/ML  SOLN COMPARISON:  None. FINDINGS: Lower chest: The visualized heart size within normal limits. No pericardial fluid/thickening. No hiatal hernia. The visualized portions of the lungs are clear. Hepatobiliary: The liver is normal in density without focal abnormality.The main portal vein is patent. No evidence of calcified  gallstones, gallbladder wall thickening or biliary dilatation. Pancreas: Unremarkable. No pancreatic ductal dilatation or surrounding inflammatory changes. Spleen: Normal in size without focal abnormality. Adrenals/Urinary Tract: Both adrenal glands appear normal. The kidneys and collecting system appear normal without evidence of urinary tract calculus or hydronephrosis. Bladder is unremarkable. Stomach/Bowel: The stomach, small bowel, and colon are normal in appearance. No inflammatory changes, wall thickening, or obstructive findings.Scattered colonic diverticula are noted without diverticulitis. Vascular/Lymphatic: There are no enlarged mesenteric, retroperitoneal, or pelvic lymph nodes. No significant vascular findings are present. Reproductive: The prostate is unremarkable. Other: Small fat containing anterior umbilical hernia is noted. Musculoskeletal: No acute or significant osseous findings. IMPRESSION: No acute intra-abdominal or pelvic pathology to explain the patient's symptoms. Electronically Signed   By: Jonna Clark M.D.   On: 02/26/2020 12:45     Procedures Procedures (including critical care time)  Medications Ordered in ED Medications  sodium chloride (PF) 0.9 % injection (  Given by Other 02/26/20 1234)  iohexol (OMNIPAQUE) 300 MG/ML solution 100 mL (100 mLs Intravenous Contrast Given 02/26/20 1213)    ED Course  I have reviewed the triage vital signs and the nursing notes.  Pertinent labs & imaging results that were available during my care of the patient were reviewed by me and considered in my medical decision making (see chart for details).    MDM Rules/Calculators/A&P                         Patient is a 44 year old man who presents today for evaluation of 2 complaints. 1. Abdominal pain: He reports that he has had vague diffuse abdominal pain for 1 day.  CMP and lipase are normal.  CBC is mild anemia, otherwise unremarkable.  Salicylate and acetaminophen levels are undetected.  Ethanol is undetected.  Difficult abdominal exam based on his body habitus, CT scan abdomen pelvis was obtained without acute findings or cause for symptoms.  2.  SI, HI, AVH: Patient left his assisted living facility about a week ago, reports since then things have "gone downhill" and he has worsening SI, HI.  Is plan is to overdose on pills.  He has vague HI however does not have a plan.  He also reports AVH, does not appear to be responding to internal stimuli. TTS consult is ordered.  Patient is medically clear for psychiatric evaluation.  Note: Portions of this report may have been transcribed using voice recognition software. Every effort was made to ensure accuracy; however, inadvertent computerized transcription errors may be present   Final Clinical Impression(s) / ED Diagnoses Final diagnoses:  Generalized abdominal pain  Suicidal ideation  Homicidal ideation  Schizoaffective disorder, depressive type (HCC)  Cocaine-induced mood disorder [facility])    Rx / DC Orders ED Discharge Orders    None       Cristina Gong,  PA-C 02/26/20 1521    Melene Plan, DO 02/26/20 1548

## 2020-02-26 NOTE — Discharge Instructions (Signed)
For your behavioral health needs, you are advised to follow up with [facility]:       [facility]      [address]      [place], [place] [address]      [phone]

## 2020-02-26 NOTE — BH Assessment (Addendum)
BHH Assessment Progress Note  Per Caryn Bee, DNP, this voluntary pt does not require psychiatric hospitalization at this time.  Pt is to be discharged from [facility] with recommendation to follow up with [facility] in [place], pt's community of residence.  This has been included in pt's discharge instructions.  Pt would also benefit from seeing Peer Support Specialists, and a peer support consult has been ordered for pt.  A social work consult has also been ordered for pt.  Pt's nurse has been notified.  Doylene Canning, MA Triage Specialist [phone]

## 2020-02-26 NOTE — ED Notes (Signed)
One large black suitcase taken and secured behind nurses station. Walked left at bedside for ambulation.

## 2020-02-26 NOTE — BHH Counselor (Signed)
Per Malachy Chamber, PMHNP patient does not meet in patient criteria. She has placed a consult to CPSS and TOC to assist. Janice Coffin, MA at [facility] ED notified and agrees to set appointment with [facility] for med management.

## 2020-02-26 NOTE — ED Notes (Signed)
TTS consult taking place at this time 

## 2020-02-27 ENCOUNTER — Encounter: Payer: Self-pay | Admitting: *Deleted

## 2020-02-27 NOTE — Congregational Nurse Program (Signed)
  Dept: [phone]   Congregational Nurse Program Note  Date of Encounter: 02/27/2020  Past Medical History: Past Medical History:  Diagnosis Date  . Depression   . ETOH abuse   . Hypertension   . Obesity   . Schizophrenia (HCC)   . Substance abuse [facility])     Encounter Details:  CNP Questionnaire - 02/27/20 1347      Questionnaire   Patient Status Not Applicable    Race Black or African American    Location Patient Served At [employer]    Uninsured Not Applicable    Food No food insecurities    Housing/Utilities No permanent housing    Transportation Yes, need transportation assistance    Interpersonal Safety No, do not feel physically and emotionally safe where you currently live    Medication No medication insecurities    Medical Provider No    Referrals Behavioral/Mental Health Provider;Other   [facility]   ED Visit Averted Not Applicable    Life-Saving Intervention Made Not Applicable          Client seen at [facility] requesting help with housing. Client referred to [facility]. He reports he formerly stayed in an Assisted Living facility. He says he goes to the ED for a monthly injection of Haldol. He reports negative experiences at [facility] and [facility]. Referred client to [facility] as he has no primary care or mental health established currently. Client took information for [facility] and wants to think about it before making an appointment. He denies si and hi. Morgane Joerger RN CN [phone]

## 2020-02-27 NOTE — Social Work (Addendum)
CSW spoke with pt about needs.  Pt wants to be at an assisted living facility.  He was recently at a facility in [facility], but it was not a good experience.  Came to [facility] in search of an assisted living facility but is currently homeless.   CSW used narrative therapy and allow pt to continue speaking about his experiences.  CSW validated pts experiences and agreed to assisting pt with resources.  CSW will continue to follow for dc needs.  Rochanda Harpham Tarpley-Carter, MSW, LCSW-A [facility] ED Transitions of Education administrator Health [phone]

## 2020-02-27 NOTE — Social Work (Signed)
TOC CSW  received consult. CSW attempting to follow up at present time.   CSW will continue to follow for dc needs.  Leeona Mccardle Tarpley-Carter, MSW, LCSW-A [facility] ED Transitions of Education administrator Health [phone]

## 2020-02-27 NOTE — Social Work (Signed)
CSW returned to pts room with bus pass to travel to [facility] and assisted living resources.  Pt was thankful.  Quanah Majka Tarpley-Carter, MSW, LCSW-A [facility] Transitions of Education administrator Health [phone]

## 2020-04-14 ENCOUNTER — Encounter: Payer: Self-pay | Admitting: *Deleted

## 2020-04-14 ENCOUNTER — Telehealth: Payer: Self-pay | Admitting: *Deleted

## 2020-04-14 NOTE — Telephone Encounter (Signed)
Left message for return call to schedule appt

## 2020-04-14 NOTE — Congregational Nurse Program (Signed)
  Dept: [phone]   Congregational Nurse Program Note  Date of Encounter: 04/14/2020  Past Medical History: Past Medical History:  Diagnosis Date  . Depression   . ETOH abuse   . Hypertension   . Obesity   . Schizophrenia (HCC)   . Substance abuse [facility])     Encounter Details:  CNP Questionnaire - 04/14/20 1410      Questionnaire   Patient Status Not Applicable    Race Black or African American    Location Patient Served At [employer]    Uninsured Not Applicable    Food No food insecurities    Housing/Utilities No permanent housing    Transportation Yes, need transportation assistance    Interpersonal Safety No, do not feel physically and emotionally safe where you currently live    Medication No medication insecurities    Referrals Primary Care Provider/Clinic    ED Visit Averted Not Applicable    Life-Saving Intervention Made Not Applicable          Client reports he has been falling asleep during the day and at night. He does not have a PCP. Checked vitals and referred to [facility]. Gave paperwork to complete. Andreka Stucki W. RN [phone]

## 2020-05-30 ENCOUNTER — Other Ambulatory Visit: Payer: Self-pay

## 2020-05-30 ENCOUNTER — Emergency Department (HOSPITAL_COMMUNITY)
Admission: EM | Admit: 2020-05-30 | Discharge: 2020-05-30 | Disposition: A | Discharge status: Home / Self Care | Payer: Medicaid Other | Attending: Emergency Medicine | Admitting: Emergency Medicine

## 2020-05-30 ENCOUNTER — Emergency Department (HOSPITAL_COMMUNITY): Payer: Medicaid Other

## 2020-05-30 ENCOUNTER — Encounter (HOSPITAL_COMMUNITY): Payer: Self-pay | Admitting: Emergency Medicine

## 2020-05-30 DIAGNOSIS — M79671 Pain in right foot: Secondary | ICD-10-CM | POA: Diagnosis not present

## 2020-05-30 DIAGNOSIS — Z5321 Procedure and treatment not carried out due to patient leaving prior to being seen by health care provider: Secondary | ICD-10-CM | POA: Insufficient documentation

## 2020-05-30 DIAGNOSIS — R079 Chest pain, unspecified: Secondary | ICD-10-CM | POA: Diagnosis not present

## 2020-05-30 DIAGNOSIS — M79672 Pain in left foot: Secondary | ICD-10-CM | POA: Insufficient documentation

## 2020-05-30 LAB — BASIC METABOLIC PANEL
Anion gap: 9 (ref 5–15)
BUN: 9 mg/dL (ref 6–20)
CO2: 30 mmol/L (ref 22–32)
Calcium: 9.5 mg/dL (ref 8.9–10.3)
Chloride: 100 mmol/L (ref 98–111)
Creatinine, Ser: 1.01 mg/dL (ref 0.61–1.24)
GFR, Estimated: >60 mL/min (ref >60)
Glucose, Bld: 92 mg/dL (ref 70–99)
Potassium: 4.9 mmol/L (ref 3.5–5.1)
Sodium: 139 mmol/L (ref 135–145)

## 2020-05-30 LAB — CBC
HCT: 40.3 % (ref 39.0–52.0)
Hemoglobin: 12.2 g/dL — ABNORMAL LOW (ref 13.0–17.0)
MCH: 29 pg (ref 26.0–34.0)
MCHC: 30.3 g/dL (ref 30.0–36.0)
MCV: 95.7 fL (ref 80.0–100.0)
Platelets: 255 10*3/uL (ref 150–400)
RBC: 4.21 MIL/uL — ABNORMAL LOW (ref 4.22–5.81)
RDW: 16.3 % — ABNORMAL HIGH (ref 11.5–15.5)
WBC: 7 10*3/uL (ref 4.0–10.5)
nRBC: 0 % (ref 0.0–0.2)

## 2020-05-30 LAB — TROPONIN I (HIGH SENSITIVITY)
Troponin I (High Sensitivity): 4 ng/L (ref <18)
Troponin I (High Sensitivity): 3 ng/L (ref <18)

## 2020-05-30 NOTE — ED Notes (Signed)
Pt constantly asking for a bus pass to go home, SW gave a bus pass and pt left without been seen.

## 2020-05-30 NOTE — ED Triage Notes (Signed)
Pt brought to ED by [facility] from a friends [facility] for complaint of mid cp and bilateral foot pain with edema. BP 148/90, HR 84, R-16, 97% RA, CBG 96%

## 2020-06-15 ENCOUNTER — Encounter (HOSPITAL_COMMUNITY): Payer: Self-pay | Admitting: Emergency Medicine

## 2020-06-15 ENCOUNTER — Emergency Department (HOSPITAL_COMMUNITY): Payer: Medicaid Other

## 2020-06-15 ENCOUNTER — Emergency Department (HOSPITAL_COMMUNITY)
Admission: EM | Admit: 2020-06-15 | Discharge: 2020-06-15 | Disposition: A | Discharge status: Home / Self Care | Payer: Medicaid Other | Attending: Emergency Medicine | Admitting: Emergency Medicine

## 2020-06-15 ENCOUNTER — Other Ambulatory Visit: Payer: Self-pay

## 2020-06-15 DIAGNOSIS — I2510 Atherosclerotic heart disease of native coronary artery without angina pectoris: Secondary | ICD-10-CM | POA: Diagnosis not present

## 2020-06-15 DIAGNOSIS — R0789 Other chest pain: Secondary | ICD-10-CM | POA: Insufficient documentation

## 2020-06-15 DIAGNOSIS — R0602 Shortness of breath: Secondary | ICD-10-CM | POA: Diagnosis not present

## 2020-06-15 DIAGNOSIS — F17210 Nicotine dependence, cigarettes, uncomplicated: Secondary | ICD-10-CM | POA: Insufficient documentation

## 2020-06-15 DIAGNOSIS — I10 Essential (primary) hypertension: Secondary | ICD-10-CM | POA: Insufficient documentation

## 2020-06-15 DIAGNOSIS — M79672 Pain in left foot: Secondary | ICD-10-CM | POA: Diagnosis not present

## 2020-06-15 DIAGNOSIS — M79671 Pain in right foot: Secondary | ICD-10-CM | POA: Diagnosis present

## 2020-06-15 DIAGNOSIS — G629 Polyneuropathy, unspecified: Secondary | ICD-10-CM | POA: Diagnosis not present

## 2020-06-15 DIAGNOSIS — Z79899 Other long term (current) drug therapy: Secondary | ICD-10-CM | POA: Insufficient documentation

## 2020-06-15 LAB — CBC
HCT: 40.7 % (ref 39.0–52.0)
Hemoglobin: 12.1 g/dL — ABNORMAL LOW (ref 13.0–17.0)
MCH: 28.3 pg (ref 26.0–34.0)
MCHC: 29.7 g/dL — ABNORMAL LOW (ref 30.0–36.0)
MCV: 95.1 fL (ref 80.0–100.0)
Platelets: 254 10*3/uL (ref 150–400)
RBC: 4.28 MIL/uL (ref 4.22–5.81)
RDW: 16 % — ABNORMAL HIGH (ref 11.5–15.5)
WBC: 8 10*3/uL (ref 4.0–10.5)
nRBC: 0 % (ref 0.0–0.2)

## 2020-06-15 LAB — BASIC METABOLIC PANEL
Anion gap: 8 (ref 5–15)
BUN: 10 mg/dL (ref 6–20)
CO2: 29 mmol/L (ref 22–32)
Calcium: 9.4 mg/dL (ref 8.9–10.3)
Chloride: 104 mmol/L (ref 98–111)
Creatinine, Ser: 0.99 mg/dL (ref 0.61–1.24)
GFR, Estimated: >60 mL/min (ref >60)
Glucose, Bld: 82 mg/dL (ref 70–99)
Potassium: 4.5 mmol/L (ref 3.5–5.1)
Sodium: 141 mmol/L (ref 135–145)

## 2020-06-15 LAB — BRAIN NATRIURETIC PEPTIDE: B Natriuretic Peptide: 57.5 pg/mL (ref 0.0–100.0)

## 2020-06-15 LAB — TROPONIN I (HIGH SENSITIVITY)
Troponin I (High Sensitivity): 4 ng/L (ref <18)
Troponin I (High Sensitivity): 5 ng/L (ref <18)

## 2020-06-15 LAB — D-DIMER, QUANTITATIVE: D-Dimer, Quant: 0.56 ug/mL-FEU — ABNORMAL HIGH (ref 0.00–0.50)

## 2020-06-15 MED ORDER — LOSARTAN POTASSIUM 25 MG PO TABS: 25.0000 mg | ORAL_TABLET | Freq: Every day | ORAL | 0 refills | Status: AC

## 2020-06-15 MED ORDER — GABAPENTIN 100 MG PO CAPS: 100.0000 mg | ORAL_CAPSULE | Freq: Three times a day (TID) | ORAL | 0 refills | Status: DC

## 2020-06-15 MED ORDER — IOHEXOL 350 MG/ML SOLN
100.0000 mL | Freq: Once | INTRAVENOUS | Status: AC | PRN
  Administered 2020-06-15: 100 mL via INTRAVENOUS

## 2020-06-15 MED ORDER — PANTOPRAZOLE SODIUM 40 MG PO TBEC
40.0000 mg | DELAYED_RELEASE_TABLET | Freq: Every day | ORAL | 0 refills | Status: DC
Start: 2020-06-15 — End: 2020-07-15

## 2020-06-15 MED ORDER — FUROSEMIDE 20 MG PO TABS: 20.0000 mg | ORAL_TABLET | Freq: Every day | ORAL | 0 refills | Status: DC

## 2020-06-15 MED ORDER — HYDROCODONE-ACETAMINOPHEN 5-325 MG PO TABS
2.0000 | ORAL_TABLET | Freq: Once | ORAL | Status: AC
  Administered 2020-06-15: 2 via ORAL
  Filled 2020-06-15: qty 2

## 2020-06-15 MED ORDER — FUROSEMIDE 10 MG/ML IJ SOLN
40.0000 mg | Freq: Once | INTRAMUSCULAR | Status: AC
  Administered 2020-06-15: 40 mg via INTRAVENOUS
  Filled 2020-06-15: qty 4

## 2020-06-15 NOTE — ED Notes (Signed)
Attempted to call report at this time. RN unable to take report.  

## 2020-06-15 NOTE — ED Triage Notes (Addendum)
Pt transported by [facility] from street with with c/o severe bilat foot pain, no recent injury.  Pt now c/o L sided CP "for a while", denies n/v, denies shob

## 2020-06-15 NOTE — ED Provider Notes (Signed)
[facility] EM[facility]GENC[facility] Provider Note   CSN: [id number] Arrival date & time: 06/15/20  0034     History Chief Complaint  Patient presents with  . Foot Pain    Bruce [name] is a 44 y.o. male.  Patient is a 44 year old male with a history of EtOH abuse, substance abuse, schizophrenia, coronary artery disease, hypertension who presents primarily with foot pain.  He complains of severe bilateral foot pain.  He says it is mostly on the bottom of his feet and it hurts to walk.  He denies any known injuries to his feet.  He says is been going on for about 2 months.  He says he currently does not have a primary care doctor.  He has some chronic swelling of his legs which is unchanged from his baseline.  He denies any known fevers.  He also has some left knee pain which is been going on for a long time.  He cannot quantify the amount of time.  He does report some left-sided chest pain.  He also states that is been going on for a while but when I ask him more he said it has been worse for about 2 days.  It is a little worse when he takes a deep breath.  He denies any associated shortness of breath.  No nausea or vomiting.  No cough or cold symptoms.  He says he currently does not have a PCP.  He does have Medicaid but [place] and is trying to figure out whether to go back there or establish care in [place].  He does say that he has a history of schizophrenia and seems that he has been having more auditory hallucinations than normal.  No suicidal ideations.        Past Medical History:  Diagnosis Date  . Depression   . ETOH abuse   . Hypertension   . Obesity   . Schizophrenia (HCC)   . Substance abuse [facility](HCC)     Patient Active Problem List   Diagnosis Date Noted  . MDD (major depressive disorder) 07/12/2019  . Malingering 06/12/2018  . Tobacco use disorder 06/09/2018  . HTN (hypertension) 06/09/2018  . Schizoaffective  disorder, depressive type (HCC) 08/04/2017  . 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 Polysubstance abuse (HCC) 02/27/2017  . Substance induced mood disorder (HCC) 02/27/2017  . Cocaine use disorder, severe, dependence (HCC) 01/05/2017  . 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 Cocaine use disorder, mild, abuse (HCC) 05/03/2016  . Cannabis use disorder, mild, abuse 05/03/2016  . Suicidal ideations 04/30/2016  . Hyperprolactinemia (HCC) 09/24/2015  . Alcohol use disorder, moderate, dependence (HCC) 09/22/2015  . Morbid obesity (HCC) 09/22/2015    History reviewed. No pertinent surgical history.     Family History  Problem Relation Age of Onset  . Schizophrenia Mother     Social History   Tobacco Use  . Smoking status: Current Every Day Smoker    Packs/day: 0.50    Types: Cigarettes  . Smokeless tobacco: Never Used  Substance Use Topics  . Alcohol use: Yes    Comment: Occasional, 2 nights per week  . Drug use: Not Currently    Types: Marijuana, "Crack" cocaine, Cocaine    Comment: +Cocaine    Home Medications Prior to Admission medications   Medication Sig Start Date End Date Taking? Authorizing Provider  acetaminophen (TYLENOL) 500 MG tablet Take 1,500-2,000 mg by mouth every 6 (six) hours as  needed for moderate pain.   Yes [provider]  furosemide (LASIX) 20 MG tablet Take 1 tablet (20 mg total) by mouth daily. 06/15/20   Rolan Bucco, MD  gabapentin (NEURONTIN) 100 MG capsule Take 1 capsule (100 mg total) by mouth 3 (three) times daily. 06/15/20   Rolan Bucco, MD  losartan (COZAAR) 25 MG tablet Take 1 tablet (25 mg total) by mouth daily. 06/15/20   Rolan Bucco, MD  pantoprazole (PROTONIX) 40 MG tablet Take 1 tablet (40 mg total) by mouth daily. 06/15/20   Rolan Bucco, MD    Allergies    Patient has no known allergies.  Review of Systems   Review of Systems  Constitutional: Negative for chills, diaphoresis, fatigue  and fever.  HENT: Negative for congestion, rhinorrhea and sneezing.   Eyes: Negative.   Respiratory: Positive for chest tightness. Negative for cough and shortness of breath.   Cardiovascular: Positive for chest pain. Negative for leg swelling.  Gastrointestinal: Negative for abdominal pain, blood in stool, diarrhea, nausea and vomiting.  Genitourinary: Negative for difficulty urinating, flank pain, frequency and hematuria.  Musculoskeletal: Positive for arthralgias. Negative for back pain.  Skin: Negative for rash.  Neurological: Negative for dizziness, speech difficulty, weakness, numbness and headaches.    Physical Exam Updated Vital Signs BP 135/76 (BP Location: Right Arm)   Pulse 84   Temp 98.1 F (36.7 C) (Oral)   Resp 20   Ht 5\' 8"  (1.727 m)   Wt (!) 190.5 kg   SpO2 96%   BMI 63.86 kg/m   Physical Exam Constitutional:      Appearance: He is well-developed. He is obese.  HENT:     Head: Normocephalic and atraumatic.  Eyes:     Pupils: Pupils are equal, round, and reactive to light.  Cardiovascular:     Rate and Rhythm: Normal rate and regular rhythm.     Heart sounds: Normal heart sounds.  Pulmonary:     Effort: Pulmonary effort is normal. No respiratory distress.     Breath sounds: Normal breath sounds. No wheezing or rales.  Chest:     Chest wall: No tenderness.  Abdominal:     General: Bowel sounds are normal.     Palpations: Abdomen is soft.     Tenderness: There is no abdominal tenderness. There is no guarding or rebound.  Musculoskeletal:        General: Swelling present. Normal range of motion.     Cervical back: Normal range of motion and neck supple.     Comments: Patient has swelling to his lower extremities bilaterally with chronic skin changes.  There is no bony tenderness to his feet or ankles.  He does have some tenderness to palpation of his left knee which he says is been going on for a long time.  There is no gross joint instability.  No  suggestions of infection.  Pedal pulses are intact.  He has normal sensation and motor function distally.  Lymphadenopathy:     Cervical: No cervical adenopathy.  Skin:    General: Skin is warm and dry.     Findings: No rash.  Neurological:     Mental Status: He is alert and oriented to person, place, and time.     ED Results / Procedures / Treatments   Labs (all labs ordered are listed, but only abnormal results are displayed) Labs Reviewed  CBC - Abnormal; Notable for the following components:      Result Value  Hemoglobin 12.1 (*)    MCHC 29.7 (*)    RDW 16.0 (*)    All other components within normal limits  D-DIMER, QUANTITATIVE (NOT AT [facility]) - Abnormal; Notable for the following components:   D-Dimer, Quant 0.56 (*)    All other components within normal limits  BASIC METABOLIC PANEL  BRAIN NATRIURETIC PEPTIDE  TROPONIN I (HIGH SENSITIVITY)  TROPONIN I (HIGH SENSITIVITY)    EKG EKG Interpretation  Date/Time:  Sunday June 15 2020 02:52:57 EDT Ventricular Rate:  85 PR Interval:  162 QRS Duration: 88 QT Interval:  387 QTC Calculation: 461 R Axis:   81 Text Interpretation: Sinus rhythm similar to prior EKGs Confirmed by Rolan Bucco [phone]) on 06/15/2020 6:14:52 AM   Radiology DG Chest 2 View  Result Date: 06/15/2020 CLINICAL DATA:  Chest pain EXAM: CHEST - 2 VIEW COMPARISON:  05/30/2020 FINDINGS: Mild cardiac enlargement with mild vascular congestion. No edema or consolidation. No pleural effusion. No pneumothorax. Mediastinal contours appear intact. IMPRESSION: Mild cardiac enlargement and vascular congestion. Electronically Signed   By: Burman Nieves M.D.   On: 06/15/2020 01:17   CT Angio Chest PE W/Cm &/Or Wo Cm  Result Date: 06/15/2020 CLINICAL DATA:  PE suspected, low/intermediate prob, positive D-dimer EXAM: CT ANGIOGRAPHY CHEST WITH CONTRAST TECHNIQUE: Multidetector CT imaging of the chest was performed using the standard protocol during bolus  administration of intravenous contrast. Multiplanar CT image reconstructions and MIPs were obtained to evaluate the vascular anatomy. CONTRAST:  OMNIPAQUE IOHEXOL 350 MG/ML SOLN COMPARISON:  None. FINDINGS: Cardiovascular: Contrast injection is sufficient to demonstrate satisfactory opacification of the pulmonary arteries to the segmental level. There is no pulmonary embolus or evidence of right heart strain. The size of the main pulmonary artery is normal. Heart size is normal, with no pericardial effusion. The course and caliber of the aorta are normal. There is no atherosclerotic calcification. Opacification decreased due to pulmonary arterial phase contrast bolus timing. Mediastinum/Nodes: No mediastinal, hilar or axillary lymphadenopathy. Normal visualized thyroid. Thoracic esophageal course is normal. Lungs/Pleura: Pulmonary vascular congestion. Upper Abdomen: Contrast bolus timing is not optimized for evaluation of the abdominal organs. The visualized portions of the organs of the upper abdomen are normal. Musculoskeletal: No chest wall abnormality. No bony spinal canal stenosis. Review of the MIP images confirms the above findings. IMPRESSION: 1. No pulmonary embolus or acute aortic syndrome. 2. Pulmonary vascular congestion. Electronically Signed   By: Deatra [name] M.D.   On: 06/15/2020 06:15    Procedures Procedures (including critical care time)  Medications Ordered in ED Medications  furosemide (LASIX) injection 40 mg (40 mg Intravenous Given 06/15/20 0359)  HYDROcodone-acetaminophen (NORCO/VICODIN) 5-325 MG per tablet 2 tablet (2 tablets Oral Given 06/15/20 0358)  iohexol (OMNIPAQUE) 350 MG/ML injection 100 mL (100 mLs Intravenous Contrast Given 06/15/20 0538)    ED Course  I have reviewed the triage vital signs and the nursing notes.  Pertinent labs & imaging results that were available during my care of the patient were reviewed by me and considered in my medical decision making  (see chart for details).    MDM Rules/Calculators/A&P                          Patient is a 44 year old male who presents primarily with bilateral lower foot pain.  He does not have any suggestions of cellulitis.  He does not have any traumatic injuries to his feet.  He has chronic swelling and the  pain has been going on for a while.  I suspect that he has a component of neuropathy.  He was given dose of pain medicine here in the ED and feels a little bit better.  I will start him on Neurontin.  He also complained of some chest pain and shortness of breath.  He does not have any ischemic changes on EKG.  His troponins are negative.  He had some pleuritic pain and a D-dimer was performed.  This was elevated.  He had a CT of his chest which shows no evidence of PE.  There is some mild vascular congestion.  He was given dose of Lasix.  His BNP is normal.  He does report that he has been out of all his medications.  He is in between doctors.  He says that he moved from [place] to [place].  He had a doctor in [facility] and he is trying to be seen in one of their practices in [place].  He has not had his medications he says for couple of weeks.  I will go ahead and give him a month supply of his medications.  He also says he is having some increased hallucinations.  He is not currently hallucinating or psychotic.  He is not having any suicidal ideations.  I did give him the information about following up with [facility]. Final Clinical Impression(s) / ED Diagnoses Final diagnoses:  Neuropathy  Other chest pain    Rx / DC Orders ED Discharge Orders         Ordered    losartan (COZAAR) 25 MG tablet  Daily        06/15/20 0635    pantoprazole (PROTONIX) 40 MG tablet  Daily        06/15/20 0635    furosemide (LASIX) 20 MG tablet  Daily        06/15/20 0635    gabapentin (NEURONTIN) 100 MG capsule  3 times daily        06/15/20 0636           Rolan Bucco, MD 06/15/20 [phone]

## 2020-06-21 ENCOUNTER — Other Ambulatory Visit: Payer: Self-pay

## 2020-06-21 ENCOUNTER — Encounter (HOSPITAL_COMMUNITY): Payer: Self-pay

## 2020-06-21 ENCOUNTER — Emergency Department (HOSPITAL_COMMUNITY)
Admission: EM | Admit: 2020-06-21 | Discharge: 2020-06-22 | Disposition: A | Discharge status: Other Acute Inpatient Hospital | Payer: Medicaid Other | Attending: Emergency Medicine | Admitting: Emergency Medicine

## 2020-06-21 DIAGNOSIS — R45851 Suicidal ideations: Secondary | ICD-10-CM | POA: Insufficient documentation

## 2020-06-21 DIAGNOSIS — Z20822 Contact with and (suspected) exposure to covid-19: Secondary | ICD-10-CM | POA: Insufficient documentation

## 2020-06-21 DIAGNOSIS — F17210 Nicotine dependence, cigarettes, uncomplicated: Secondary | ICD-10-CM | POA: Insufficient documentation

## 2020-06-21 DIAGNOSIS — F1410 Cocaine abuse, uncomplicated: Secondary | ICD-10-CM | POA: Diagnosis not present

## 2020-06-21 DIAGNOSIS — F209 Schizophrenia, unspecified: Secondary | ICD-10-CM | POA: Insufficient documentation

## 2020-06-21 DIAGNOSIS — R45850 Homicidal ideations: Secondary | ICD-10-CM | POA: Insufficient documentation

## 2020-06-21 DIAGNOSIS — I10 Essential (primary) hypertension: Secondary | ICD-10-CM | POA: Insufficient documentation

## 2020-06-21 LAB — CBC
HCT: 38.5 % — ABNORMAL LOW (ref 39.0–52.0)
Hemoglobin: 11.7 g/dL — ABNORMAL LOW (ref 13.0–17.0)
MCH: 29.1 pg (ref 26.0–34.0)
MCHC: 30.4 g/dL (ref 30.0–36.0)
MCV: 95.8 fL (ref 80.0–100.0)
Platelets: 215 10*3/uL (ref 150–400)
RBC: 4.02 MIL/uL — ABNORMAL LOW (ref 4.22–5.81)
RDW: 15.9 % — ABNORMAL HIGH (ref 11.5–15.5)
WBC: 7.7 10*3/uL (ref 4.0–10.5)
nRBC: 0 % (ref 0.0–0.2)

## 2020-06-21 LAB — RAPID URINE DRUG SCREEN, HOSP PERFORMED
Amphetamines: NOT DETECTED
Barbiturates: NOT DETECTED
Benzodiazepines: NOT DETECTED
Cocaine: POSITIVE — AB
Opiates: NOT DETECTED
Tetrahydrocannabinol: NOT DETECTED

## 2020-06-21 NOTE — ED Provider Notes (Signed)
[facility]-EMERGENCY DEPT Provider Note   CSN: [id number] Arrival date & time: 06/21/20  2138     History Chief Complaint  Patient presents with  . Suicidal  . Homicidal    Bruce [name] is a 44 y.o. male.  The history is provided by the patient and medical records.    44 year old male with history of depression, alcohol abuse, hypertension, schizophrenia, obesity, presenting to the ED with suicidal and homicidal ideation.  Patient states he has been having these thoughts for a few days now.  States his grandparents passed a few months ago and he has had a hard time dealing with this.  States lately he feels like he is walking on earth, however when sleeping he is in hell with his father and satan.  States this causes him not to want to sleep as he does not like feeling that way.  States he has been talking with his father in hell who is telling him to get people to join him and if not, they will need to be killed.  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  Patient states a dog tried to attack him today, he hit it with a brick, took it behind a building, cut off his head, removed his clothes, and bathed in the dogs blood by holding his decapitated head above his own and letting the blood run all over him.  States he is concerned because he does enjoy the feeling of killing something.  He states he came in today because his mother is concerned about his wellbeing.  Past Medical History:  Diagnosis Date  . Depression   . ETOH abuse   . Hypertension   . Obesity   . Schizophrenia (HCC)   . Substance abuse [facility])     Patient Active Problem List   Diagnosis Date Noted  . MDD (major depressive disorder) 07/12/2019  . Malingering 06/12/2018  . Tobacco use disorder 06/09/2018  . HTN (hypertension) 06/09/2018  . Schizoaffective disorder, depressive type (HCC) 08/04/2017  . Cocaine-induced  psychotic disorder (HCC) 02/28/2017  . Polysubstance abuse (HCC) 02/27/2017  . Substance induced mood disorder (HCC) 02/27/2017  . Cocaine use disorder, severe, dependence (HCC) 01/05/2017  . 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 Cocaine use disorder, mild, abuse (HCC) 05/03/2016  . Cannabis use disorder, mild, abuse 05/03/2016  . Suicidal ideations 04/30/2016  . Hyperprolactinemia (HCC) 09/24/2015  . Alcohol use disorder, moderate, dependence (HCC) 09/22/2015  . Morbid obesity (HCC) 09/22/2015    History reviewed. No pertinent surgical history.     Family History  Problem Relation Age of Onset  . Schizophrenia Mother     Social History   Tobacco Use  . Smoking status: Current Every Day Smoker    Packs/day: 0.50    Types: Cigarettes  . Smokeless tobacco: Never Used  Substance Use Topics  . Alcohol use: Yes    Comment: Occasional, 2 nights per week  . Drug use: Not Currently    Types: Marijuana, "Crack" cocaine, Cocaine    Comment: +Cocaine    Home Medications Prior to Admission medications   Medication Sig Start Date End Date Taking? Authorizing Provider  acetaminophen (TYLENOL) 500 MG tablet Take 1,500-2,000 mg by mouth every 6 (six) hours as needed for moderate pain.    [provider]  furosemide (LASIX) 20 MG tablet Take 1 tablet (20 mg total) by mouth daily. 06/15/20  Rolan Bucco, MD  gabapentin (NEURONTIN) 100 MG capsule Take 1 capsule (100 mg total) by mouth 3 (three) times daily. 06/15/20   Rolan Bucco, MD  losartan (COZAAR) 25 MG tablet Take 1 tablet (25 mg total) by mouth daily. 06/15/20   Rolan Bucco, MD  pantoprazole (PROTONIX) 40 MG tablet Take 1 tablet (40 mg total) by mouth daily. 06/15/20   Rolan Bucco, MD    Allergies    Patient has no known allergies.  Review of Systems   Review of Systems  Psychiatric/Behavioral: Positive for suicidal ideas.  All other systems reviewed and are negative.   Physical  Exam Updated Vital Signs BP (!) 173/121 (BP Location: Left Wrist)   Pulse 98   Temp 98 F (36.7 C) (Oral)   Resp 20   Ht 5\' 8"  (1.727 m)   Wt (!) 190.5 kg   SpO2 97%   BMI 63.86 kg/m   Physical Exam Vitals and nursing note reviewed.  Constitutional:      Appearance: He is well-developed.     Comments: Morbidly obese  HENT:     Head: Normocephalic and atraumatic.  Eyes:     Conjunctiva/sclera: Conjunctivae normal.     Pupils: Pupils are equal, round, and reactive to light.  Cardiovascular:     Rate and Rhythm: Normal rate and regular rhythm.     Heart sounds: Normal heart sounds.  Pulmonary:     Effort: Pulmonary effort is normal.     Breath sounds: Normal breath sounds. No stridor. No wheezing.  Abdominal:     General: Bowel sounds are normal.     Palpations: Abdomen is soft.     Tenderness: There is no abdominal tenderness. There is no rebound.  Musculoskeletal:        General: Normal range of motion.     Cervical back: Normal range of motion.  Skin:    General: Skin is warm and dry.  Neurological:     Mental Status: He is alert and oriented to person, place, and time.  Psychiatric:        Thought Content: Thought content includes homicidal and suicidal ideation. Thought content includes homicidal plan.     ED Results / Procedures / Treatments   Labs (all labs ordered are listed, but only abnormal results are displayed) Labs Reviewed  SALICYLATE LEVEL - Abnormal; Notable for the following components:      Result Value   Salicylate Lvl <7.0 (*)    All other components within normal limits  ACETAMINOPHEN LEVEL - Abnormal; Notable for the following components:   Acetaminophen (Tylenol), Serum <10 (*)    All other components within normal limits  CBC - Abnormal; Notable for the following components:   RBC 4.02 (*)    Hemoglobin 11.7 (*)    HCT 38.5 (*)    RDW 15.9 (*)    All other components within normal limits  RAPID URINE DRUG SCREEN, HOSP PERFORMED -  Abnormal; Notable for the following components:   Cocaine POSITIVE (*)    All other components within normal limits  RESPIRATORY PANEL BY RT PCR (FLU A&B, COVID)  COMPREHENSIVE METABOLIC PANEL  ETHANOL    EKG None  Radiology No results found.  Procedures Procedures (including critical care time)  Medications Ordered in ED Medications - No data to display  ED Course  I have reviewed the triage vital signs and the nursing notes.  Pertinent labs & imaging results that were available during my care of the patient were  reviewed by me and considered in my medical decision making (see chart for details).    MDM Rules/Calculators/A&P  44 y.o. M here with SI/HI.  Has thoughts of dad in hell with satan asking him to recruit others and if they refuse they should be killed.  He did kill a dog tonight and bathed in the blood-- states he is disturbed because he did enjoy the act of killing.  He does have access to knives.  Denies drug use or heavy EtOH.  He states mother sent him in for evaluation.  He denies physical complaints currently.  Labs are grossly reassuring, UDS + for cocaine.  He is hypertensive without findings of end organ damage.  Given dose of his home cozaar.  It appears he has not been taking this.  Will get TTS consult.  TTS has evaluated, recommends IP treatment.  They will seek placement.  BP is better controlled after home meds, now 138/89.  RT panel is negative.  Will continue home meds while awaiting placement.  Patient has been resting comfortably here without complaint.  Final Clinical Impression(s) / ED Diagnoses Final diagnoses:  Homicidal ideation    Rx / DC Orders ED Discharge Orders    None       Garlon Hatchet, PA-C 06/22/20 1173    Charlynne Pander, MD 06/22/20 [phone]

## 2020-06-21 NOTE — ED Notes (Addendum)
Pt. Dressed out and wanded by security. Pt. Has 1 blue duffle bag with blanket and 2 white bags. Pt. Belongings locked up in cabinet between 16-18 side. Pt. Has dark blue/light blue blanket at bedside.

## 2020-06-21 NOTE — ED Triage Notes (Signed)
Pt reports suicidal and homicidal ideation. Pt also reports finding a dog cutting his head off and holding it above his head squeezing the blood on himself stating hail satan.

## 2020-06-21 NOTE — ED Notes (Signed)
Pt provided with a sandwich and coffee.

## 2020-06-22 ENCOUNTER — Other Ambulatory Visit: Payer: Self-pay

## 2020-06-22 ENCOUNTER — Ambulatory Visit (HOSPITAL_COMMUNITY)
Admission: EM | Admit: 2020-06-22 | Discharge: 2020-06-23 | Disposition: A | Discharge status: Home / Self Care | Payer: Medicaid Other | Attending: Family | Admitting: Family

## 2020-06-22 DIAGNOSIS — F1914 Other psychoactive substance abuse with psychoactive substance-induced mood disorder: Secondary | ICD-10-CM | POA: Insufficient documentation

## 2020-06-22 DIAGNOSIS — F17210 Nicotine dependence, cigarettes, uncomplicated: Secondary | ICD-10-CM | POA: Insufficient documentation

## 2020-06-22 DIAGNOSIS — I10 Essential (primary) hypertension: Secondary | ICD-10-CM | POA: Insufficient documentation

## 2020-06-22 DIAGNOSIS — F1994 Other psychoactive substance use, unspecified with psychoactive substance-induced mood disorder: Secondary | ICD-10-CM

## 2020-06-22 DIAGNOSIS — F1910 Other psychoactive substance abuse, uncomplicated: Secondary | ICD-10-CM

## 2020-06-22 DIAGNOSIS — Z20822 Contact with and (suspected) exposure to covid-19: Secondary | ICD-10-CM | POA: Insufficient documentation

## 2020-06-22 LAB — COMPREHENSIVE METABOLIC PANEL
ALT: 16 U/L (ref 0–44)
AST: 16 U/L (ref 15–41)
Albumin: 3.5 g/dL (ref 3.5–5.0)
Alkaline Phosphatase: 52 U/L (ref 38–126)
Anion gap: 8 (ref 5–15)
BUN: 13 mg/dL (ref 6–20)
CO2: 27 mmol/L (ref 22–32)
Calcium: 8.9 mg/dL (ref 8.9–10.3)
Chloride: 103 mmol/L (ref 98–111)
Creatinine, Ser: 1.17 mg/dL (ref 0.61–1.24)
GFR, Estimated: >60 mL/min (ref >60)
Glucose, Bld: 96 mg/dL (ref 70–99)
Potassium: 3.5 mmol/L (ref 3.5–5.1)
Sodium: 138 mmol/L (ref 135–145)
Total Bilirubin: 0.3 mg/dL (ref 0.3–1.2)
Total Protein: 7.9 g/dL (ref 6.5–8.1)

## 2020-06-22 LAB — SALICYLATE LEVEL: Salicylate Lvl: <7 mg/dL — ABNORMAL LOW (ref 7.0–30.0)

## 2020-06-22 LAB — RESPIRATORY PANEL BY RT PCR (FLU A&B, COVID)
Influenza A by PCR: NEGATIVE
Influenza B by PCR: NEGATIVE
SARS Coronavirus 2 by RT PCR: NEGATIVE

## 2020-06-22 LAB — ACETAMINOPHEN LEVEL: Acetaminophen (Tylenol), Serum: <10 ug/mL — ABNORMAL LOW (ref 10–30)

## 2020-06-22 LAB — ETHANOL: Alcohol, Ethyl (B): <10 mg/dL (ref <10)

## 2020-06-22 MED ORDER — LOSARTAN POTASSIUM 25 MG PO TABS
25.0000 mg | ORAL_TABLET | Freq: Once | ORAL | Status: AC
  Administered 2020-06-22: 25 mg via ORAL

## 2020-06-22 MED ORDER — ACETAMINOPHEN 325 MG PO TABS: 650.0000 mg | ORAL_TABLET | Freq: Four times a day (QID) | ORAL | Status: DC | PRN

## 2020-06-22 MED ORDER — MAGNESIUM HYDROXIDE 400 MG/5ML PO SUSP: 30.0000 mL | Freq: Every day | ORAL | Status: DC | PRN

## 2020-06-22 MED ORDER — GABAPENTIN 100 MG PO CAPS
200.0000 mg | ORAL_CAPSULE | Freq: Two times a day (BID) | ORAL | Status: DC
  Administered 2020-06-22: 200 mg via ORAL
  Filled 2020-06-22: qty 2

## 2020-06-22 MED ORDER — LOSARTAN POTASSIUM 25 MG PO TABS: 25.0000 mg | ORAL_TABLET | Freq: Every day | ORAL | Status: DC

## 2020-06-22 MED ORDER — ZIPRASIDONE HCL 20 MG PO CAPS: 20.0000 mg | ORAL_CAPSULE | Freq: Two times a day (BID) | ORAL | Status: DC

## 2020-06-22 MED ORDER — ALUM & MAG HYDROXIDE-SIMETH 200-200-20 MG/5ML PO SUSP: 30.0000 mL | ORAL | Status: DC | PRN

## 2020-06-22 MED ORDER — TRAZODONE HCL 50 MG PO TABS: 50.0000 mg | ORAL_TABLET | Freq: Every evening | ORAL | Status: DC | PRN

## 2020-06-22 MED ORDER — GABAPENTIN 100 MG PO CAPS
100.0000 mg | ORAL_CAPSULE | Freq: Two times a day (BID) | ORAL | Status: DC
  Administered 2020-06-22: 100 mg via ORAL
  Filled 2020-06-22: qty 1

## 2020-06-22 MED ORDER — FUROSEMIDE 20 MG PO TABS
20.0000 mg | ORAL_TABLET | Freq: Once | ORAL | Status: AC
  Administered 2020-06-22: 20 mg via ORAL
  Filled 2020-06-22: qty 1

## 2020-06-22 MED ORDER — LOSARTAN POTASSIUM 50 MG PO TABS
25.0000 mg | ORAL_TABLET | Freq: Once | ORAL | Status: AC
  Administered 2020-06-22: 25 mg via ORAL
  Filled 2020-06-22: qty 1

## 2020-06-22 NOTE — ED Notes (Signed)
Locker #[id number] 

## 2020-06-22 NOTE — BH Assessment (Addendum)
Per Shnese Mills, NP, patient evaluated and recommended for overnight observation at the [facility]. Takia Lewis, NP, accepts patient for admission to the [facility]. Requested nursing to coordinate transportation utilizing SAF[employer] TRANSPORT. Also, call report to #[phone] prior to transfer from [facility] to the [facility].  Charge nurse updated with disposition information.  

## 2020-06-22 NOTE — ED Notes (Signed)
Patient in shower 

## 2020-06-22 NOTE — ED Notes (Signed)
Pt escorted on unit. Ambulates per self. A&Ox4. Oriented to staff and unit. SI without plan, HI without plan, endorses [facility] telling him to hurt himself.  Will contract for safety while here. Will continue to monitor for safety

## 2020-06-22 NOTE — ED Notes (Signed)
Pt provided breakfast tray, calm and cooperative at this time.  

## 2020-06-22 NOTE — ED Notes (Signed)
Meal given

## 2020-06-22 NOTE — ED Notes (Signed)
ED TO INPATIENT HANDOFF REPORT  ED Nurse Name and Phone #: [phone]  S Name/Age/Gender Arlan Organ 44 y.o. male Room/Bed: WHALD/WHALD  Code Status   Code Status: Full Code  Home/SNF/Other Home Patient oriented to: self, place, time and situation Is this baseline? Yes   Triage Complete: Triage complete  Chief Complaint SI, HI   Triage Note Pt reports suicidal and homicidal ideation. Pt also reports finding a dog cutting his head off and holding it above his head squeezing the blood on himself stating hail satan.     Allergies No Known Allergies  Level of Care/Admitting Diagnosis ED Disposition    None      B Medical/Surgery History Past Medical History:  Diagnosis Date  . Depression   . ETOH abuse   . Hypertension   . Obesity   . Schizophrenia (HCC)   . Substance abuse (HCC)    History reviewed. No pertinent surgical history.   A IV Location/Drains/Wounds Patient Lines/Drains/Airways Status    Active Line/Drains/Airways    None          Intake/Output Last 24 hours No intake or output data in the 24 hours ending 06/22/20 1223  Labs/Imaging Results for orders placed or performed during the hospital encounter of 06/21/20 (from the past 48 hour(s))  Rapid urine drug screen (hospital performed)     Status: Abnormal   Collection Time: 06/21/20 10:32 PM  Result Value Ref Range   Opiates NONE DETECTED NONE DETECTED   Cocaine POSITIVE (A) NONE DETECTED   Benzodiazepines NONE DETECTED NONE DETECTED   Amphetamines NONE DETECTED NONE DETECTED   Tetrahydrocannabinol NONE DETECTED NONE DETECTED   Barbiturates NONE DETECTED NONE DETECTED    Comment: (NOTE) DRUG SCREEN FOR MEDICAL PURPOSES ONLY.  IF CONFIRMATION IS NEEDED FOR ANY PURPOSE, NOTIFY LAB WITHIN 5 DAYS.  LOWEST DETECTABLE LIMITS FOR URINE DRUG SCREEN Drug Class                     Cutoff (ng/mL) Amphetamine and metabolites    1000 Barbiturate and metabolites    200 Benzodiazepine                  200 Tricyclics and metabolites     300 Opiates and metabolites        300 Cocaine and metabolites        300 THC                            50 Performed at [facility], [address], [place], [place] [address]   Comprehensive metabolic panel     Status: None   Collection Time: 06/21/20 11:23 PM  Result Value Ref Range   Sodium 138 135 - 145 mmol/L   Potassium 3.5 3.5 - 5.1 mmol/L   Chloride 103 98 - 111 mmol/L   CO2 27 22 - 32 mmol/L   Glucose, Bld 96 70 - 99 mg/dL    Comment: Glucose reference range applies only to samples taken after fasting for at least 8 hours.   BUN 13 6 - 20 mg/dL   Creatinine, Ser 9.52 0.61 - 1.24 mg/dL   Calcium 8.9 8.9 - 84.1 mg/dL   Total Protein 7.9 6.5 - 8.1 g/dL   Albumin 3.5 3.5 - 5.0 g/dL   AST 16 15 - 41 U/L   ALT 16 0 - 44 U/L   Alkaline Phosphatase 52 38 - 126 U/L  Total Bilirubin 0.3 0.3 - 1.2 mg/dL   GFR, Estimated >42 >35 mL/min    Comment: (NOTE) Calculated using the CKD-EPI Creatinine Equation (2021)    Anion gap 8 5 - 15    Comment: Performed at [facility], [address] [place], [place] [address]  Ethanol     Status: None   Collection Time: 06/21/20 11:23 PM  Result Value Ref Range   Alcohol, Ethyl (B) <10 <10 mg/dL    Comment: (NOTE) Lowest detectable limit for serum alcohol is 10 mg/dL.  For medical purposes only. Performed at [facility], [address], [place], [place] [address]   Salicylate level     Status: Abnormal   Collection Time: 06/21/20 11:23 PM  Result Value Ref Range   Salicylate Lvl <7.0 (L) 7.0 - 30.0 mg/dL    Comment: Performed at [facility], [address], [place], [place] [address]  Acetaminophen level     Status: Abnormal   Collection Time: 06/21/20 11:23 PM  Result Value Ref Range   Acetaminophen (Tylenol), Serum <10 (L) 10 - 30 ug/mL    Comment: (NOTE) Therapeutic concentrations vary significantly. A  range of 10-30 ug/mL  may be an effective concentration for many patients. However, some  are best treated at concentrations outside of this range. Acetaminophen concentrations >150 ug/mL at 4 hours after ingestion  and >50 ug/mL at 12 hours after ingestion are often associated with  toxic reactions.  Performed at [facility], [address], [place], [place] [address]   cbc     Status: Abnormal   Collection Time: 06/21/20 11:23 PM  Result Value Ref Range   WBC 7.7 4.0 - 10.5 K/uL   RBC 4.02 (L) 4.22 - 5.81 MIL/uL   Hemoglobin 11.7 (L) 13.0 - 17.0 g/dL   HCT 32.6 (L) 39 - 52 %   MCV 95.8 80.0 - 100.0 fL   MCH 29.1 26.0 - 34.0 pg   MCHC 30.4 30.0 - 36.0 g/dL   RDW 71.2 (H) 45.8 - 09.9 %   Platelets 215 150 - 400 K/uL   nRBC 0.0 0.0 - 0.2 %    Comment: Performed at [facility], [address], [place], [place] [address]  Respiratory Panel by RT PCR (Flu A&B, Covid) - Nasopharyngeal Swab     Status: None   Collection Time: 06/22/20 12:21 AM   Specimen: Nasopharyngeal Swab  Result Value Ref Range   SARS Coronavirus 2 by RT PCR NEGATIVE NEGATIVE    Comment: (NOTE) SARS-CoV-2 target nucleic acids are NOT DETECTED.  The SARS-CoV-2 RNA is generally detectable in upper respiratoy specimens during the acute phase of infection. The lowest concentration of SARS-CoV-2 viral copies this assay can detect is 131 copies/mL. A negative result does not preclude SARS-Cov-2 infection and should not be used as the sole basis for treatment or other patient management decisions. A negative result may occur with  improper specimen collection/handling, submission of specimen other than nasopharyngeal swab, presence of viral mutation(s) within the areas targeted by this assay, and inadequate number of viral copies (<131 copies/mL). A negative result must be combined with clinical observations, patient history, and epidemiological information. The expected  result is Negative.  Fact Sheet for Patients:  https://www.moore.com/  Fact Sheet for Healthcare Providers:  https://www.young.biz/  This test is no t yet approved or cleared by the [place] FDA and  has been authorized for detection and/or diagnosis of SARS-CoV-2 by  FDA under an Emergency Use Authorization (EUA). This EUA will remain  in effect (meaning this test can be used) for the duration of the COVID-19 declaration under Section 564(b)(1) of the Act, 21 U.S.C. section 360bbb-3(b)(1), unless the authorization is terminated or revoked sooner.     Influenza A by PCR NEGATIVE NEGATIVE   Influenza B by PCR NEGATIVE NEGATIVE    Comment: (NOTE) The Xpert Xpress SARS-CoV-2/FLU/RSV assay is intended as an aid in  the diagnosis of influenza from Nasopharyngeal swab specimens and  should not be used as a sole basis for treatment. Nasal washings and  aspirates are unacceptable for Xpert Xpress SARS-CoV-2/FLU/RSV  testing.  Fact Sheet for Patients: https://www.moore.com/  Fact Sheet for Healthcare Providers: https://www.young.biz/  This test is not yet approved or cleared by the [place] FDA and  has been authorized for detection and/or diagnosis of SARS-CoV-2 by  FDA under an Emergency Use Authorization (EUA). This EUA will remain  in effect (meaning this test can be used) for the duration of the  Covid-19 declaration under Section 564(b)(1) of the Act, 21  U.S.C. section 360bbb-3(b)(1), unless the authorization is  terminated or revoked. Performed at [facility], [address] [place], [place] [address]    No results found.  Pending Labs Unresulted Labs (From admission, onward)         None      Vitals/Pain Today's Vitals   06/21/20 2219 06/21/20 2230 06/22/20 0528 06/22/20 0730  BP: (!) 173/121  138/89   Pulse: 98  87   Resp: 20  15   Temp: 98 F (36.7 C)      TempSrc: Oral     SpO2: 97%  99%   Weight: (!) 190.5 kg     Height: 5\' 8"  (1.727 m)     PainSc:  0-No pain  Asleep    Isolation Precautions No active isolations  Medications Medications  gabapentin (NEURONTIN) capsule 200 mg (200 mg Oral Given 06/22/20 1129)  ziprasidone (GEODON) capsule 20 mg (has no administration in time range)  losartan (COZAAR) tablet 25 mg (25 mg Oral Given 06/22/20 0202)    Mobility walks Low fall risk   Focused Assessments .   R Recommendations: See Admitting Provider Note  Report given to:   Additional Notes: n/a
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
## 2020-06-22 NOTE — ED Provider Notes (Addendum)
Behavioral Health Admission H&P [facility] & OBS)  Date: 06/22/20 Patient Name: Bruce [name] MRN: [id number] Chief Complaint: No chief complaint on file.     Diagnoses:  Final diagnoses:  None    HPI: Per admission assessment note: Bruce [name] is an 44 y.o. male.  -Clinician reviewed note by Sharilyn Sites, PA.  Pt presenting to the ED with suicidal and homicidal ideation. Patient states he has been having these thoughts for a few days now.  Patient states a dog tried to attack him today, he hit it with a brick, took it behind a building, cut off his head, removed his clothes, and bathed in the dogs blood by holding his decapitated head above his own and letting the blood run all over him. States he is concerned because he does enjoy the feeling of killing something.Patient says that he had been staying at an assisted living facility in [facility].  Patient says he was there from end of July to end of August and since then he has been "staying on people's couches."  Patient is essentially homeless.  Evaluation: [facility] is well-known to this service.  Patient was transferred from [facility] emergency department for overnight observation.  Continues to endorse homicidal and  suicidal ideations.  Reported auditory and visual hallucinations her reported hallucination "comes and goes." intermittent in nature.  NP will restart medications where appropriate.  Support, encouragement and reassurance was provided  PHQ 2-9:    ED from 02/21/2020 in [facility] EMERGENCY DEPARTMENT  Thoughts that you would be better off dead, or of hurting yourself in some way Nearly every day  PHQ-9 Total Score 23        ED from 06/21/2020 in [facility] ED from 02/26/2020 in [facility] Admission (Discharged) from 07/12/2019 in BEHAVIORAL HEALTH OBSERVATION UNIT  C-SSRS RISK CATEGORY High Risk Error: Q7 should not be populated when Q6 is No  Error: Q7 should not be populated when Q6 is No       Total Time spent with patient: 15 minutes  Musculoskeletal  Strength & Muscle Tone: within normal limits Gait & Station: normal Patient leans: N/A  Psychiatric Specialty Exam  Presentation General Appearance: Appropriate for Environment  Eye Contact:Good  Speech:Clear and Coherent  Speech Volume:Normal  Handedness:Right   Mood and Affect  Mood:Depressed  Affect:Appropriate   Thought Process  Thought Processes:Coherent  Descriptions of Associations:Intact  Orientation:Full (Time, Place and Person)  Thought Content:Logical  Hallucinations:Hallucinations: None  Ideas of Reference:None  Suicidal Thoughts:Suicidal Thoughts: Yes, Passive SI Passive Intent and/or Plan: Without Intent  Homicidal Thoughts:Homicidal Thoughts: No   Sensorium  Memory:Immediate Good;Recent Good;Remote Good  Judgment:Fair  Insight:Fair   Executive Functions  Concentration:Good  Attention Span:Good  Recall:Good  Fund of Knowledge:Fair  Language:Fair   Psychomotor Activity  Psychomotor Activity:Psychomotor Activity: Normal   Assets  Assets:Communication Skills;Housing;Social Support   Sleep  Sleep:Sleep: Good   Physical Exam Vitals reviewed.  Neurological:     Mental Status: He is oriented to person, place, and time.  Psychiatric:        Attention and Perception: Attention normal.        Mood and Affect: Mood normal.        Behavior: Behavior normal. Behavior is cooperative.        Thought Content: Thought content normal.        Cognition and Memory: Cognition normal.        Judgment: Judgment normal.  Review of Systems  Psychiatric/Behavioral: Positive for depression and suicidal ideas. Negative for substance abuse. The patient is nervous/anxious and has insomnia.   All other systems reviewed and are negative.   Blood pressure 138/90, temperature 98.1 F (36.7 C), temperature source Oral, resp.  rate 20, height 5\' 8"  (1.727 m), weight (!) 390 lb (176.9 kg), SpO2 96 %. Body mass index is 59.3 kg/m.  Past Psychiatric History:   Is the patient at risk to self? No  Has the patient been a risk to self in the past 6 months? No .    Has the patient been a risk to self within the distant past? No   Is the patient a risk to others? No   Has the patient been a risk to others in the past 6 months? No   Has the patient been a risk to others within the distant past? No   Past Medical History:  Past Medical History:  Diagnosis Date  . Depression   . ETOH abuse   . Hypertension   . Obesity   . Schizophrenia (HCC)   . Substance abuse (HCC)    No past surgical history on file.  Family History:  Family History  Problem Relation Age of Onset  . Schizophrenia Mother     Social History:  Social History   Socioeconomic History  . Marital status: Single    Spouse name: Not on file  . Number of children: Not on file  . Years of education: Not on file  . Highest education level: Not on file  Occupational History  . Occupation: On disability  Tobacco Use  . Smoking status: Current Every Day Smoker    Packs/day: 0.50    Types: Cigarettes  . Smokeless tobacco: Never Used  Substance and Sexual Activity  . Alcohol use: Yes    Comment: Occasional, 2 nights per week  . Drug use: Not Currently    Types: Marijuana, "Crack" cocaine, Cocaine    Comment: +Cocaine  . Sexual activity: Not Currently  Other Topics Concern  . Not on file  Social History Narrative   Pt lives in [place] with a friend.  He is on disability.  He stated that he is not followed by an outpatient psych provider.   Social Determinants of Health   Financial Resource Strain:   . Difficulty of Paying Living Expenses: Not on file  Food Insecurity:   . Worried About [place] in the Last Year: Not on file  . Ran Out of Food in the Last Year: Not on file  Transportation Needs:   . Lack of Transportation  (Medical): Not on file  . Lack of Transportation (Non-Medical): Not on file  Physical Activity:   . Days of Exercise per Week: Not on file  . Minutes of Exercise per Session: Not on file  Stress:   . Feeling of Stress : Not on file  Social Connections:   . Frequency of Communication with Friends and Family: Not on file  . Frequency of Social Gatherings with Friends and Family: Not on file  . Attends Religious Services: Not on file  . Active Member of Clubs or Organizations: Not on file  . Attends Programme researcher, broadcasting/film/video Meetings: Not on file  . Marital Status: Not on file  Intimate Partner Violence:   . Fear of Current or Ex-Partner: Not on file  . Emotionally Abused: Not on file  . Physically Abused: Not on file  . Sexually Abused: Not  on file    SDOH:  SDOH Screenings   Alcohol Screen: Low Risk   . Last Alcohol Screening Score (AUDIT): 1  Depression (PHQ2-9): Medium Risk  . PHQ-2 Score: 23  Financial Resource Strain:   . Difficulty of Paying Living Expenses: Not on file  Food Insecurity:   . Worried About Programme researcher, broadcasting/film/video in the Last Year: Not on file  . Ran Out of Food in the Last Year: Not on file  Housing:   . Last Housing Risk Score: Not on file  Physical Activity:   . Days of Exercise per Week: Not on file  . Minutes of Exercise per Session: Not on file  Social Connections:   . Frequency of Communication with Friends and Family: Not on file  . Frequency of Social Gatherings with Friends and Family: Not on file  . Attends Religious Services: Not on file  . Active Member of Clubs or Organizations: Not on file  . Attends Banker Meetings: Not on file  . Marital Status: Not on file  Stress:   . Feeling of Stress : Not on file  Tobacco Use: High Risk  . Smoking Tobacco Use: Current Every Day Smoker  . Smokeless Tobacco Use: Never Used  Transportation Needs:   . Freight forwarder (Medical): Not on file  . Lack of Transportation (Non-Medical): Not  on file    Last Labs:  Admission on 06/21/2020, Discharged on 06/22/2020  Component Date Value Ref Range Status  . Sodium 06/21/2020 138  135 - 145 mmol/L Final  . Potassium 06/21/2020 3.5  3.5 - 5.1 mmol/L Final  . Chloride 06/21/2020 103  98 - 111 mmol/L Final  . CO2 06/21/2020 27  22 - 32 mmol/L Final  . Glucose, Bld 06/21/2020 96  70 - 99 mg/dL Final   Glucose reference range applies only to samples taken after fasting for at least 8 hours.  . BUN 06/21/2020 13  6 - 20 mg/dL Final  . Creatinine, Ser 06/21/2020 1.17  0.61 - 1.24 mg/dL Final  . Calcium 16/05/9603 8.9  8.9 - 10.3 mg/dL Final  . Total Protein 06/21/2020 7.9  6.5 - 8.1 g/dL Final  . Albumin 54/04/8118 3.5  3.5 - 5.0 g/dL Final  . AST 14/78/2956 16  15 - 41 U/L Final  . ALT 06/21/2020 16  0 - 44 U/L Final  . Alkaline Phosphatase 06/21/2020 52  38 - 126 U/L Final  . Total Bilirubin 06/21/2020 0.3  0.3 - 1.2 mg/dL Final  . GFR, Estimated 06/21/2020 >60  >60 mL/min Final   Comment: (NOTE) Calculated using the CKD-EPI Creatinine Equation (2021)   . Anion gap 06/21/2020 8  5 - 15 Final   Performed at [facility], [address] [place], [place] [address]  . Alcohol, Ethyl (B) 06/21/2020 <10  <10 mg/dL Final   Comment: (NOTE) Lowest detectable limit for serum alcohol is 10 mg/dL.  For medical purposes only. Performed at [facility], [address] [place], [place] [address]   . Salicylate Lvl 06/21/2020 <7.0* 7.0 - 30.0 mg/dL Final   Performed at [facility], [address] [place], [place] [address]  . Acetaminophen (Tylenol), Serum 06/21/2020 <10* 10 - 30 ug/mL Final   Comment: (NOTE) Therapeutic concentrations vary significantly. A range of 10-30 ug/mL  may be an effective concentration for many patients. However, some  are best treated at concentrations outside of this range. Acetaminophen concentrations >150 ug/mL  at 4 hours after ingestion  and  >50 ug/mL at 12 hours after ingestion are often associated with  toxic reactions.  Performed at [facility], [address] [place], [place] [address]   . WBC 06/21/2020 7.7  4.0 - 10.5 K/uL Final  . RBC 06/21/2020 4.02* 4.22 - 5.81 MIL/uL Final  . Hemoglobin 06/21/2020 11.7* 13.0 - 17.0 g/dL Final  . HCT 60/45/4098 38.5* 39 - 52 % Final  . MCV 06/21/2020 95.8  80.0 - 100.0 fL Final  . MCH 06/21/2020 29.1  26.0 - 34.0 pg Final  . MCHC 06/21/2020 30.4  30.0 - 36.0 g/dL Final  . RDW 11/91/4782 15.9* 11.5 - 15.5 % Final  . Platelets 06/21/2020 215  150 - 400 K/uL Final  . nRBC 06/21/2020 0.0  0.0 - 0.2 % Final   Performed at [facility], [address], [place], [place] [address]  . Opiates 06/21/2020 NONE DETECTED  NONE DETECTED Final  . Cocaine 06/21/2020 POSITIVE* NONE DETECTED Final  . Benzodiazepines 06/21/2020 NONE DETECTED  NONE DETECTED Final  . Amphetamines 06/21/2020 NONE DETECTED  NONE DETECTED Final  . Tetrahydrocannabinol 06/21/2020 NONE DETECTED  NONE DETECTED Final  . Barbiturates 06/21/2020 NONE DETECTED  NONE DETECTED Final   Comment: (NOTE) DRUG SCREEN FOR MEDICAL PURPOSES ONLY.  IF CONFIRMATION IS NEEDED FOR ANY PURPOSE, NOTIFY LAB WITHIN 5 DAYS.  LOWEST DETECTABLE LIMITS FOR URINE DRUG SCREEN Drug Class                     Cutoff (ng/mL) Amphetamine and metabolites    1000 Barbiturate and metabolites    200 Benzodiazepine                 200 Tricyclics and metabolites     300 Opiates and metabolites        300 Cocaine and metabolites        300 THC                            50 Performed at [facility], [address], [place], [place] [address]   . SARS Coronavirus 2 by RT PCR 06/22/2020 NEGATIVE  NEGATIVE Final   Comment: (NOTE) SARS-CoV-2 target nucleic acids are NOT DETECTED.  The SARS-CoV-2 RNA is generally detectable in upper respiratoy specimens during the acute phase of infection. The  lowest concentration of SARS-CoV-2 viral copies this assay can detect is 131 copies/mL. A negative result does not preclude SARS-Cov-2 infection and should not be used as the sole basis for treatment or other patient management decisions. A negative result may occur with  improper specimen collection/handling, submission of specimen other than nasopharyngeal swab, presence of viral mutation(s) within the areas targeted by this assay, and inadequate number of viral copies (<131 copies/mL). A negative result must be combined with clinical observations, patient history, and epidemiological information. The expected result is Negative.  Fact Sheet for Patients:  https://www.moore.com/  Fact Sheet for Healthcare Providers:  https://www.young.biz/  This test is no                          t yet approved or cleared by the [place] FDA and  has been authorized for detection and/or diagnosis of SARS-CoV-2 by FDA under an Emergency Use Authorization (EUA). This EUA will remain  in effect (meaning this test can be used) for the  duration of the COVID-19 declaration under Section 564(b)(1) of the Act, 21 U.S.C. section 360bbb-3(b)(1), unless the authorization is terminated or revoked sooner.    . Influenza A by PCR 06/22/2020 NEGATIVE  NEGATIVE Final  . Influenza B by PCR 06/22/2020 NEGATIVE  NEGATIVE Final   Comment: (NOTE) The Xpert Xpress SARS-CoV-2/FLU/RSV assay is intended as an aid in  the diagnosis of influenza from Nasopharyngeal swab specimens and  should not be used as a sole basis for treatment. Nasal washings and  aspirates are unacceptable for Xpert Xpress SARS-CoV-2/FLU/RSV  testing.  Fact Sheet for Patients: https://www.moore.com/  Fact Sheet for Healthcare Providers: https://www.young.biz/  This test is not yet approved or cleared by the [place] FDA and  has been authorized for  detection and/or diagnosis of SARS-CoV-2 by  FDA under an Emergency Use Authorization (EUA). This EUA will remain  in effect (meaning this test can be used) for the duration of the  Covid-19 declaration under Section 564(b)(1) of the Act, 21  U.S.C. section 360bbb-3(b)(1), unless the authorization is  terminated or revoked. Performed at [facility], [address], [place], [place] [address]   Admission on 06/15/2020, Discharged on 06/15/2020  Component Date Value Ref Range Status  . Sodium 06/15/2020 141  135 - 145 mmol/L Final  . Potassium 06/15/2020 4.5  3.5 - 5.1 mmol/L Final  . Chloride 06/15/2020 104  98 - 111 mmol/L Final  . CO2 06/15/2020 29  22 - 32 mmol/L Final  . Glucose, Bld 06/15/2020 82  70 - 99 mg/dL Final   Glucose reference range applies only to samples taken after fasting for at least 8 hours.  . BUN 06/15/2020 10  6 - 20 mg/dL Final  . Creatinine, Ser 06/15/2020 0.99  0.61 - 1.24 mg/dL Final  . Calcium 19/14/7829 9.4  8.9 - 10.3 mg/dL Final  . GFR, Estimated 06/15/2020 >60  >60 mL/min Final   Comment: (NOTE) Calculated using the CKD-EPI Creatinine Equation (2021)   . Anion gap 06/15/2020 8  5 - 15 Final   Performed at [facility], [address] [place], [place] [address]  . WBC 06/15/2020 8.0  4.0 - 10.5 K/uL Final  . RBC 06/15/2020 4.28  4.22 - 5.81 MIL/uL Final  . Hemoglobin 06/15/2020 12.1* 13.0 - 17.0 g/dL Final  . HCT 08/65/7846 40.7  39 - 52 % Final  . MCV 06/15/2020 95.1  80.0 - 100.0 fL Final  . MCH 06/15/2020 28.3  26.0 - 34.0 pg Final  . MCHC 06/15/2020 29.7* 30.0 - 36.0 g/dL Final  . RDW 96/29/5284 16.0* 11.5 - 15.5 % Final  . Platelets 06/15/2020 254  150 - 400 K/uL Final  . nRBC 06/15/2020 0.0  0.0 - 0.2 % Final   Performed at [facility], [address], [place], [place] [address]  . Troponin I (High Sensitivity) 06/15/2020 4  <18 ng/L Final   Comment: (NOTE) Elevated high sensitivity troponin I (hsTnI) values  and significant  changes across serial measurements may suggest ACS but many other  chronic and acute conditions are known to elevate hsTnI results.  Refer to the "Links" section for chest pain algorithms and additional  guidance. Performed at [facility], [address] [place], [place] [address]   . Troponin I (High Sensitivity) 06/15/2020 5  <18 ng/L Final   Comment: (NOTE) Elevated high sensitivity troponin I (hsTnI) values and significant  changes across serial measurements may suggest ACS but many  other  chronic and acute conditions are known to elevate hsTnI results.  Refer to the "Links" section for chest pain algorithms and additional  guidance. Performed at [facility], [address] [place], [place] [address]   . B Natriuretic Peptide 06/15/2020 57.5  0.0 - 100.0 pg/mL Final   Performed at [facility], [address], [place], [place] [address]  . D-Dimer, Quant 06/15/2020 0.56* 0.00 - 0.50 ug/mL-FEU Final   Comment: (NOTE) At the manufacturer cut-off value of 0.5 g/mL FEU, this assay has a negative predictive value of 95-100%.This assay is intended for use in conjunction with a clinical pretest probability (PTP) assessment model to exclude pulmonary embolism (PE) and deep venous thrombosis (DVT) in outpatients suspected of PE or DVT. Results should be correlated with clinical presentation. Performed at [facility], [address] [place], [place] [address]   Admission on 05/30/2020, Discharged on 05/30/2020  Component Date Value Ref Range Status  . Sodium 05/30/2020 139  135 - 145 mmol/L Final  . Potassium 05/30/2020 4.9  3.5 - 5.1 mmol/L Final  . Chloride 05/30/2020 100  98 - 111 mmol/L Final  . CO2 05/30/2020 30  22 - 32 mmol/L Final  . Glucose, Bld 05/30/2020 92  70 - 99 mg/dL Final   Glucose reference range applies only to samples taken after fasting for at least 8 hours.  . BUN 05/30/2020 9  6 - 20 mg/dL Final  . Creatinine,  Ser 05/30/2020 1.01  0.61 - 1.24 mg/dL Final  . Calcium 91/47/8295 9.5  8.9 - 10.3 mg/dL Final  . GFR, Estimated 05/30/2020 >60  >60 mL/min Final  . Anion gap 05/30/2020 9  5 - 15 Final   Performed at [facility], [address], [place], [place] [address]  . WBC 05/30/2020 7.0  4.0 - 10.5 K/uL Final  . RBC 05/30/2020 4.21* 4.22 - 5.81 MIL/uL Final  . Hemoglobin 05/30/2020 12.2* 13.0 - 17.0 g/dL Final  . HCT 86/57/8469 40.3  39 - 52 % Final  . MCV 05/30/2020 95.7  80.0 - 100.0 fL Final  . MCH 05/30/2020 29.0  26.0 - 34.0 pg Final  . MCHC 05/30/2020 30.3  30.0 - 36.0 g/dL Final  . RDW 62/95/2841 16.3* 11.5 - 15.5 % Final  . Platelets 05/30/2020 255  150 - 400 K/uL Final  . nRBC 05/30/2020 0.0  0.0 - 0.2 % Final   Performed at [facility], [address] [place], [place] [address]  . Troponin I (High Sensitivity) 05/30/2020 4  <18 ng/L Final   Comment: (NOTE) Elevated high sensitivity troponin I (hsTnI) values and significant  changes across serial measurements may suggest ACS but many other  chronic and acute conditions are known to elevate hsTnI results.  Refer to the "Links" section for chest pain algorithms and additional  guidance. Performed at [facility], [address] [place], [place] [address]   . Troponin I (High Sensitivity) 05/30/2020 3  <18 ng/L Final   Comment: (NOTE) Elevated high sensitivity troponin I (hsTnI) values and significant  changes across serial measurements may suggest ACS but many other  chronic and acute conditions are known to elevate hsTnI results.  Refer to the "Links" section for chest pain algorithms and additional  guidance. Performed at [facility], [address], [place], [place] [address]   Admission on 02/26/2020, Discharged on 02/26/2020  Component Date Value Ref Range Status  .  Sodium 02/26/2020 140  135 - 145 mmol/L Final  . Potassium 02/26/2020 4.3  3.5 - 5.1 mmol/L Final  . Chloride 02/26/2020 101  98 -  111 mmol/L Final  . CO2 02/26/2020 30  22 - 32 mmol/L Final  . Glucose, Bld 02/26/2020 94  70 - 99 mg/dL Final   Glucose reference range applies only to samples taken after fasting for at least 8 hours.  . BUN 02/26/2020 15  6 - 20 mg/dL Final  . Creatinine, Ser 02/26/2020 1.06  0.61 - 1.24 mg/dL Final  . Calcium 16/05/9603 9.3  8.9 - 10.3 mg/dL Final  . Total Protein 02/26/2020 8.0  6.5 - 8.1 g/dL Final  . Albumin 54/04/8118 3.7  3.5 - 5.0 g/dL Final  . AST 14/78/2956 19  15 - 41 U/L Final  . ALT 02/26/2020 17  0 - 44 U/L Final  . Alkaline Phosphatase 02/26/2020 61  38 - 126 U/L Final  . Total Bilirubin 02/26/2020 0.5  0.3 - 1.2 mg/dL Final  . GFR calc non Af Amer 02/26/2020 >60  >60 mL/min Final  . GFR calc Af Amer 02/26/2020 >60  >60 mL/min Final  . Anion gap 02/26/2020 9  5 - 15 Final   Performed at [facility], [address], [place], [place] [address]  . Alcohol, Ethyl (B) 02/26/2020 <10  <10 mg/dL Final   Comment: (NOTE) Lowest detectable limit for serum alcohol is 10 mg/dL.  For medical purposes only. Performed at [facility], [address] [place], [place] [address]   . Opiates 02/26/2020 NONE DETECTED  NONE DETECTED Final  . Cocaine 02/26/2020 POSITIVE* NONE DETECTED Final  . Benzodiazepines 02/26/2020 NONE DETECTED  NONE DETECTED Final  . Amphetamines 02/26/2020 NONE DETECTED  NONE DETECTED Final  . Tetrahydrocannabinol 02/26/2020 NONE DETECTED  NONE DETECTED Final  . Barbiturates 02/26/2020 NONE DETECTED  NONE DETECTED Final   Comment: (NOTE) DRUG SCREEN FOR MEDICAL PURPOSES ONLY.  IF CONFIRMATION IS NEEDED FOR ANY PURPOSE, NOTIFY LAB WITHIN 5 DAYS.  LOWEST DETECTABLE LIMITS FOR URINE DRUG SCREEN Drug Class                     Cutoff (ng/mL) Amphetamine and metabolites    1000 Barbiturate and metabolites    200 Benzodiazepine                 200 Tricyclics and metabolites     300 Opiates and metabolites         300 Cocaine and metabolites        300 THC                            50 Performed at [facility], [address], [place], [place] [address]   . WBC 02/26/2020 8.7  4.0 - 10.5 K/uL Final  . RBC 02/26/2020 3.98* 4.22 - 5.81 MIL/uL Final  . Hemoglobin 02/26/2020 11.5* 13.0 - 17.0 g/dL Final  . HCT 52/84/1324 37.4* 39 - 52 % Final  . MCV 02/26/2020 94.0  80.0 - 100.0 fL Final  . MCH 02/26/2020 28.9  26.0 - 34.0 pg Final  . MCHC 02/26/2020 30.7  30.0 - 36.0 g/dL Final  . RDW 40/05/2724 17.8* 11.5 - 15.5 % Final  . Platelets 02/26/2020 259  150 - 400 K/uL Final  . nRBC 02/26/2020 0.0  0.0 - 0.2 % Final  . Neutrophils Relative % 02/26/2020  75  % Final  . Neutro Abs 02/26/2020 6.5  1.7 - 7.7 K/uL Final  . Lymphocytes Relative 02/26/2020 19  % Final  . Lymphs Abs 02/26/2020 1.7  0.7 - 4.0 K/uL Final  . Monocytes Relative 02/26/2020 4  % Final  . Monocytes Absolute 02/26/2020 0.4  0.1 - 1.0 K/uL Final  . Eosinophils Relative 02/26/2020 2  % Final  . Eosinophils Absolute 02/26/2020 0.2  0.0 - 0.5 K/uL Final  . Basophils Relative 02/26/2020 0  % Final  . Basophils Absolute 02/26/2020 0.0  0.0 - 0.1 K/uL Final  . Immature Granulocytes 02/26/2020 0  % Final  . Abs Immature Granulocytes 02/26/2020 0.03  0.00 - 0.07 K/uL Final   Performed at [facility], [address] [place], [place] [address]  . Salicylate Lvl 02/26/2020 <7.0* 7.0 - 30.0 mg/dL Final   Performed at [facility], [address] [place], [place] [address]  . Acetaminophen (Tylenol), Serum 02/26/2020 <10* 10 - 30 ug/mL Final   Comment: (NOTE) Therapeutic concentrations vary significantly. A range of 10-30 ug/mL  may be an effective concentration for many patients. However, some  are best treated at concentrations outside of this range. Acetaminophen concentrations >150 ug/mL at 4 hours after ingestion  and >50 ug/mL at 12 hours after ingestion are often associated  with  toxic reactions.  Performed at [facility], [address], [place], [place] [address]   . Lipase 02/26/2020 22  11 - 51 U/L Final   Performed at [facility], [address] [place], [place] [address]  . SARS Coronavirus 2 02/26/2020 NEGATIVE  NEGATIVE Final   Comment: (NOTE) SARS-CoV-2 target nucleic acids are NOT DETECTED.  The SARS-CoV-2 RNA is generally detectable in upper and lower respiratory specimens during the acute phase of infection. The lowest concentration of SARS-CoV-2 viral copies this assay can detect is 250 copies / mL. A negative result does not preclude SARS-CoV-2 infection and should not be used as the sole basis for treatment or other patient management decisions.  A negative result may occur with improper specimen collection / handling, submission of specimen other than nasopharyngeal swab, presence of viral mutation(s) within the areas targeted by this assay, and inadequate number of viral copies (<250 copies / mL). A negative result must be combined with clinical observations, patient history, and epidemiological information.  Fact Sheet for Patients:   BoilerBrush.com.cy  Fact Sheet for Healthcare Providers: https://pope.com/  This test is not yet approved or                           cleared by the [place] FDA and has been authorized for detection and/or diagnosis of SARS-CoV-2 by FDA under an Emergency Use Authorization (EUA).  This EUA will remain in effect (meaning this test can be used) for the duration of the COVID-19 declaration under Section 564(b)(1) of the Act, 21 U.S.C. section 360bbb-3(b)(1), unless the authorization is terminated or revoked sooner.  Performed at [facility], [address], [place], [place] [address]   Admission on 02/22/2020, Discharged on 02/23/2020  Component Date Value Ref Range Status  . SARS  Coronavirus 2 Ag 02/22/2020 NEGATIVE  NEGATIVE Final   Performed at [facility], [address] [place], [place] [address]  Admission on 02/21/2020, Discharged on 02/22/2020  Component Date Value Ref Range Status  . Sodium 02/21/2020 141  135 - 145  mmol/L Final  . Potassium 02/21/2020 3.5  3.5 - 5.1 mmol/L Final  . Chloride 02/21/2020 103  98 - 111 mmol/L Final  . CO2 02/21/2020 30  22 - 32 mmol/L Final  . Glucose, Bld 02/21/2020 90  70 - 99 mg/dL Final   Glucose reference range applies only to samples taken after fasting for at least 8 hours.  . BUN 02/21/2020 8  6 - 20 mg/dL Final  . Creatinine, Ser 02/21/2020 0.91  0.61 - 1.24 mg/dL Final  . Calcium 16/10/960407/08/2019 9.2  8.9 - 10.3 mg/dL Final  . GFR calc non Af Amer 02/21/2020 >60  >60 mL/min Final  . GFR calc Af Amer 02/21/2020 >60  >60 mL/min Final  . Anion gap 02/21/2020 8  5 - 15 Final   Performed at [facility], [address] [place], [place] [address]  . WBC 02/21/2020 10.2  4.0 - 10.5 K/uL Final  . RBC 02/21/2020 3.90* 4.22 - 5.81 MIL/uL Final  . Hemoglobin 02/21/2020 11.1* 13.0 - 17.0 g/dL Final  . HCT 11/91/478207/08/2019 36.9* 39 - 52 % Final  . MCV 02/21/2020 94.6  80.0 - 100.0 fL Final  . MCH 02/21/2020 28.5  26.0 - 34.0 pg Final  . MCHC 02/21/2020 30.1  30.0 - 36.0 g/dL Final  . RDW 95/62/130807/08/2019 18.0* 11.5 - 15.5 % Final  . Platelets 02/21/2020 334  150 - 400 K/uL Final  . nRBC 02/21/2020 0.2  0.0 - 0.2 % Final   Performed at [facility], [address] [place], [place] [address]  . Troponin I (High Sensitivity) 02/21/2020 4  <18 ng/L Final   Comment: (NOTE) Elevated high sensitivity troponin I (hsTnI) values and significant  changes across serial measurements may suggest ACS but many other  chronic and acute conditions are known to elevate hsTnI results.  Refer to the "Links" section for chest pain algorithms and additional  guidance. Performed at [facility], [address] [place] [address]   . Troponin I (High Sensitivity) 02/21/2020 6  <18 ng/L Final   Comment: (NOTE) Elevated high sensitivity troponin I (hsTnI) values and significant  changes across serial measurements may suggest ACS but many other  chronic and acute conditions are known to elevate hsTnI results.  Refer to the "Links" section for chest pain algorithms and additional  guidance. Performed at [facility], [address] [place], [place] [address]   . Alcohol, Ethyl (B) 02/22/2020 <10  <10 mg/dL Final   Comment: (NOTE) Lowest detectable limit for serum alcohol is 10 mg/dL.  For medical purposes only. Performed at [facility], [address] [place], [place] [address]   . Opiates 02/22/2020 NONE DETECTED  NONE DETECTED Final  . Cocaine 02/22/2020 POSITIVE* NONE DETECTED Final  . Benzodiazepines 02/22/2020 NONE DETECTED  NONE DETECTED Final  . Amphetamines 02/22/2020 NONE DETECTED  NONE DETECTED Final  . Tetrahydrocannabinol 02/22/2020 NONE DETECTED  NONE DETECTED Final  . Barbiturates 02/22/2020 NONE DETECTED  NONE DETECTED Final   Comment: (NOTE) DRUG SCREEN FOR MEDICAL PURPOSES ONLY.  IF CONFIRMATION IS NEEDED FOR ANY PURPOSE, NOTIFY LAB WITHIN 5 DAYS.  LOWEST DETECTABLE LIMITS FOR URINE DRUG SCREEN Drug Class                     Cutoff (ng/mL) Amphetamine and metabolites    1000 Barbiturate and metabolites    200 Benzodiazepine  200 Tricyclics and metabolites     300 Opiates and metabolites        300 Cocaine and metabolites        300 THC                            50 Performed at [facility], [address] [place], [place] [address]   . Color, Urine 02/22/2020 AMBER* YELLOW Final   BIOCHEMICALS MAY BE AFFECTED BY COLOR  . APPearance 02/22/2020 CLEAR  CLEAR Final  . Specific Gravity, Urine 02/22/2020 1.026  1.005 - 1.030 Final  . pH 02/22/2020 5.0  5.0 - 8.0 Final  . Glucose, UA 02/22/2020 NEGATIVE  NEGATIVE mg/dL Final  . Hgb urine  dipstick 02/22/2020 NEGATIVE  NEGATIVE Final  . Bilirubin Urine 02/22/2020 NEGATIVE  NEGATIVE Final  . Ketones, ur 02/22/2020 NEGATIVE  NEGATIVE mg/dL Final  . Protein, ur 82/50/5397 NEGATIVE  NEGATIVE mg/dL Final  . Nitrite 67/34/1937 NEGATIVE  NEGATIVE Final  . Glori Luis 02/22/2020 NEGATIVE  NEGATIVE Final   Performed at [facility], [address], [place], [place] [address]  . Total CK 02/22/2020 44* 49.0 - 397.0 U/L Final   Performed at [facility], [address], [place], [place] [address]    Allergies: Patient has no known allergies.  PTA Medications: (Not in a hospital admission)   Medical Decision Making  Patient was transferred from [facility] emergency department for overnight observation -Restarted medications where appropriate -Patient to be reassessed by psychiatry    Recommendations  Based on my evaluation the patient does not appear to have an emergency medical condition.  Oneta Rack, NP 06/22/20  5:23 PM

## 2020-06-22 NOTE — ED Notes (Signed)
Safe transport call to transport the patient to [facility].

## 2020-06-22 NOTE — ED Notes (Signed)
Pt is voluntary and a direct admit from [facility]. Pt cooperative during assessment and endorses SI and AVH. Pt is on unit in chair/bed and contracts for safety. Pt is safe on the unit.

## 2020-06-22 NOTE — ED Notes (Signed)
Pt sleeping @90 . Will continue to monitor pt for safety

## 2020-06-23 MED ORDER — GABAPENTIN 100 MG PO CAPS
100.0000 mg | ORAL_CAPSULE | Freq: Three times a day (TID) | ORAL | Status: DC
  Administered 2020-06-23: 100 mg via ORAL
  Filled 2020-06-23: qty 1

## 2020-06-23 MED ORDER — FUROSEMIDE 20 MG PO TABS
20.0000 mg | ORAL_TABLET | Freq: Every day | ORAL | Status: DC
  Administered 2020-06-23: 20 mg via ORAL
  Filled 2020-06-23: qty 1

## 2020-06-23 MED ORDER — FUROSEMIDE 20 MG PO TABS: 20.0000 mg | ORAL_TABLET | Freq: Every day | ORAL | 0 refills | Status: DC

## 2020-06-23 MED ORDER — LOSARTAN POTASSIUM 50 MG PO TABS
25.0000 mg | ORAL_TABLET | Freq: Every day | ORAL | Status: DC
  Administered 2020-06-23: 25 mg via ORAL
  Filled 2020-06-23: qty 1

## 2020-06-23 MED ORDER — PANTOPRAZOLE SODIUM 40 MG PO TBEC
40.0000 mg | DELAYED_RELEASE_TABLET | Freq: Every day | ORAL | Status: DC
  Administered 2020-06-23: 40 mg via ORAL
  Filled 2020-06-23: qty 1

## 2020-06-23 NOTE — ED Provider Notes (Signed)
FBC/OBS ASAP Discharge Summary  Date and Time: 06/23/2020 10:37 AM  Name: Bruce [name]  MRN:  [id number]   Discharge Diagnoses:  Final diagnoses:  Polysubstance abuse (HCC)  Substance induced mood disorder (HCC)  Hypertension, unspecified type    Subjective: Patient reports today that he is doing better.  He denies any suicidal or homicidal ideations and denies any hallucinations.  Patient reports that he has been traveling around and recurrently has no place to stay and that he is essentially homeless.  When asked if he has any potential housing options he states that if he can get the [place] he has some friends that he can stay with.  He is informed that we will attempt to get him transportation there but is not guaranteed.  He states that if he can get there to stay with his friends and he will be safe to discharge.  During the conversation patient was asked if he had any transportation to assist him with getting housing he had a private vehicle and patient denied any vehicle and states that he uses public transportation or gets rides from friends when he goes somewhere.  Stay Summary: Patient is a 44 year old male who presented to the ED reporting suicidal and homicidal ideations and detailed the story of a dog of trying to attack him today and he hit it with a brick and took about a building and cut off his hand and removed his clothes and bathed in the dog exploded by holding the decapitated him above his own and letting the blood run all over him.  Patient had reported that he was staying in [facility] and that he left that facility and has been moving from place to place staying on friends houses.  Patient was admitted to continuous observation unit to remain overnight.  This morning the patient has not reported any suicidal or homicidal ideations and denied any hallucinations.  Patient denied having any thoughts of wanting to harm or kill himself.  He is requesting assistance with  transportation to [place].  Patient reported that he has his prescriptions except for his Lasix and requested a new prescription for it.  Patient was informed about open access at the [facility] but stated that if he can get discharged he will follow-up in [place].  Patient was able to provide me with the address to the apartment complex that he needs to go to to stay with friends.  Nursing staff established transportation with safe transport.  Patient was provided with prescription for his Lasix.  Patient continued to deny any suicidal or homicidal ideations and denied any hallucinations.  Of note, this patient has a long history of presenting to the emergency department reporting suicidal and homicidal ideations and based off of the chart review it is been mainly to seek secondary gain from the hospital for housing and food.  Total Time spent with patient: 30 minutes  Past Psychiatric History: MDD, numerous ED visits, previous hosiptalizations Past Medical History:  Past Medical History:  Diagnosis Date  . Depression   . ETOH abuse   . Hypertension   . Obesity   . Schizophrenia (HCC)   . Substance abuse (HCC)    No past surgical history on file. Family History:  Family History  Problem Relation Age of Onset  . Schizophrenia Mother    Family Psychiatric History: Mother - schizophrenia Social History:  Social History   Substance and Sexual Activity  Alcohol Use Yes   Comment: Occasional, 2 nights per  week     Social History   Substance and Sexual Activity  Drug Use Not Currently  . Types: Marijuana, "Crack" cocaine, Cocaine   Comment: +Cocaine    Social History   Socioeconomic History  . Marital status: Single    Spouse name: Not on file  . Number of children: Not on file  . Years of education: Not on file  . Highest education level: Not on file  Occupational History  . Occupation: On disability  Tobacco Use  . Smoking status: Current Every Day Smoker    Packs/day: 0.50     Types: Cigarettes  . Smokeless tobacco: Never Used  Substance and Sexual Activity  . Alcohol use: Yes    Comment: Occasional, 2 nights per week  . Drug use: Not Currently    Types: Marijuana, "Crack" cocaine, Cocaine    Comment: +Cocaine  . Sexual activity: Not Currently  Other Topics Concern  . Not on file  Social History Narrative   Pt lives in [place] with a friend.  He is on disability.  He stated that he is not followed by an outpatient psych provider.   Social Determinants of Health   Financial Resource Strain:   . Difficulty of Paying Living Expenses: Not on file  Food Insecurity:   . Worried About Programme researcher, broadcasting/film/video in the Last Year: Not on file  . Ran Out of Food in the Last Year: Not on file  Transportation Needs:   . Lack of Transportation (Medical): Not on file  . Lack of Transportation (Non-Medical): Not on file  Physical Activity:   . Days of Exercise per Week: Not on file  . Minutes of Exercise per Session: Not on file  Stress:   . Feeling of Stress : Not on file  Social Connections:   . Frequency of Communication with Friends and Family: Not on file  . Frequency of Social Gatherings with Friends and Family: Not on file  . Attends Religious Services: Not on file  . Active Member of Clubs or Organizations: Not on file  . Attends Banker Meetings: Not on file  . Marital Status: Not on file   SDOH:  SDOH Screenings   Alcohol Screen: Low Risk   . Last Alcohol Screening Score (AUDIT): 1  Depression (PHQ2-9): Medium Risk  . PHQ-2 Score: 23  Financial Resource Strain:   . Difficulty of Paying Living Expenses: Not on file  Food Insecurity:   . Worried About Programme researcher, broadcasting/film/video in the Last Year: Not on file  . Ran Out of Food in the Last Year: Not on file  Housing:   . Last Housing Risk Score: Not on file  Physical Activity:   . Days of Exercise per Week: Not on file  . Minutes of Exercise per Session: Not on file  Social Connections:    . Frequency of Communication with Friends and Family: Not on file  . Frequency of Social Gatherings with Friends and Family: Not on file  . Attends Religious Services: Not on file  . Active Member of Clubs or Organizations: Not on file  . Attends Banker Meetings: Not on file  . Marital Status: Not on file  Stress:   . Feeling of Stress : Not on file  Tobacco Use: High Risk  . Smoking Tobacco Use: Current Every Day Smoker  . Smokeless Tobacco Use: Never Used  Transportation Needs:   . Freight forwarder (Medical): Not on file  .  Lack of Transportation (Non-Medical): Not on file    Has this patient used any form of tobacco in the last 30 days? (Cigarettes, Smokeless Tobacco, Cigars, and/or Pipes) A prescription for an FDA-approved tobacco cessation medication was offered at discharge and the patient refused  Current Medications:  Current Facility-Administered Medications  Medication Dose Route Frequency Provider Last Rate Last Admin  . acetaminophen (TYLENOL) tablet 650 mg  650 mg Oral Q6H PRN Oneta RackLewis, Tanika N, NP      . alum & mag hydroxide-simeth (MAALOX/MYLANTA) 200-200-20 MG/5ML suspension 30 mL  30 mL Oral Q4H PRN Oneta RackLewis, Tanika N, NP      . furosemide (LASIX) tablet 20 mg  20 mg Oral Daily Kellen Hover, Gerlene Burdockravis B, FNP   20 mg at 06/23/20 0915  . gabapentin (NEURONTIN) capsule 100 mg  100 mg Oral TID Miryah Ralls, Gerlene Burdockravis B, FNP   100 mg at 06/23/20 0915  . losartan (COZAAR) tablet 25 mg  25 mg Oral Daily Jada Fass, Gerlene Burdockravis B, FNP   25 mg at 06/23/20 0915  . magnesium hydroxide (MILK OF MAGNESIA) suspension 30 mL  30 mL Oral Daily PRN Oneta RackLewis, Tanika N, NP      . pantoprazole (PROTONIX) EC tablet 40 mg  40 mg Oral Daily Shunda Rabadi, Gerlene Burdockravis B, FNP   40 mg at 06/23/20 0915  . traZODone (DESYREL) tablet 50 mg  50 mg Oral QHS PRN Oneta RackLewis, Tanika N, NP       Current Outpatient Medications  Medication Sig Dispense Refill  . furosemide (LASIX) 20 MG tablet Take 1 tablet (20 mg total) by mouth daily.  30 tablet 0  . gabapentin (NEURONTIN) 100 MG capsule Take 1 capsule (100 mg total) by mouth 3 (three) times daily. (Patient not taking: Reported on 06/21/2020) 60 capsule 0  . losartan (COZAAR) 25 MG tablet Take 1 tablet (25 mg total) by mouth daily. (Patient not taking: Reported on 06/21/2020) 30 tablet 0  . pantoprazole (PROTONIX) 40 MG tablet Take 1 tablet (40 mg total) by mouth daily. (Patient not taking: Reported on 06/21/2020) 30 tablet 0    PTA Medications: (Not in a hospital admission)   Musculoskeletal  Strength & Muscle Tone: within normal limits Gait & Station: normal Patient leans: N/A  Psychiatric Specialty Exam  Presentation  General Appearance: Appropriate for Environment;Casual  Eye Contact:Good  Speech:Clear and Coherent;Normal Rate  Speech Volume:Normal  Handedness:Right   Mood and Affect  Mood:Euthymic  Affect:Appropriate;Congruent   Thought Process  Thought Processes:Coherent  Descriptions of Associations:Intact  Orientation:Full (Time, Place and Person)  Thought Content:WDL  Hallucinations:Hallucinations: None  Ideas of Reference:None  Suicidal Thoughts:Suicidal Thoughts: No SI Passive Intent and/or Plan: Without Intent  Homicidal Thoughts:Homicidal Thoughts: No   Sensorium  Memory:Immediate Good;Recent Good;Remote Good  Judgment:Good  Insight:Good   Executive Functions  Concentration:Good  Attention Span:Good  Recall:Good  Fund of Knowledge:Good  Language:Good   Psychomotor Activity  Psychomotor Activity:Psychomotor Activity: Normal   Assets  Assets:Communication Skills;Desire for Improvement;Financial Resources/Insurance;Social Support   Sleep  Sleep:Sleep: Good   Physical Exam  Physical Exam Vitals and nursing note reviewed.  Constitutional:      Appearance: He is well-developed.  HENT:     Head: Normocephalic.  Eyes:     Pupils: Pupils are equal, round, and reactive to light.  Cardiovascular:      Rate and Rhythm: Normal rate.  Pulmonary:     Effort: Pulmonary effort is normal.  Musculoskeletal:        General: Normal range of motion.  Neurological:  Mental Status: He is alert and oriented to person, place, and time.    Review of Systems  Constitutional: Negative.   HENT: Negative.   Eyes: Negative.   Respiratory: Negative.   Cardiovascular: Negative.   Gastrointestinal: Negative.   Genitourinary: Negative.   Musculoskeletal: Negative.   Skin: Negative.   Neurological: Negative.   Endo/Heme/Allergies: Negative.   Psychiatric/Behavioral: Positive for substance abuse.   Blood pressure 119/87, pulse 75, temperature (!) 97.3 F (36.3 C), temperature source Oral, resp. rate 18, height 5\' 8"  (1.727 m), weight (!) 390 lb (176.9 kg), SpO2 97 %. Body mass index is 59.3 kg/m.  Demographic Factors:  Male and Low socioeconomic status  Loss Factors: NA  Historical Factors: Family history of mental illness or substance abuse  Risk Reduction Factors:   Sense of responsibility to family, Living with another person, especially a relative and Positive social support  Continued Clinical Symptoms:  Alcohol/Substance Abuse/Dependencies Previous Psychiatric Diagnoses and Treatments  Cognitive Features That Contribute To Risk:  None    Suicide Risk:  Minimal: No identifiable suicidal ideation.  Patients presenting with no risk factors but with morbid ruminations; may be classified as minimal risk based on the severity of the depressive symptoms  Plan Of Care/Follow-up recommendations:  Continue activity as tolerated. Continue diet as recommended by your PCP. Ensure to keep all appointments with outpatient providers.  Disposition: Discharge to friends house in [place], [place] 06/23/2020, 10:37 AM

## 2020-06-23 NOTE — Discharge Instructions (Addendum)

## 2020-06-23 NOTE — ED Notes (Signed)
Nurse Discharge Note:  D:Patient denies SI/HI/AVH at this time. Pt appears calm and cooperative, and no distress noted.  A: All Personal items in locker returned to pt. Pt given AVS/Follow-Up/Prescriptions. Pt escorted off unit by staff to meet safe transport driver.  R:  Pt States he will comply with outpatient services, and take medications as prescribed.

## 2020-06-23 NOTE — ED Notes (Signed)
Patient sitting in chair watching television. No distress noted. Monitoring continues.

## 2020-06-23 NOTE — ED Notes (Signed)
Pt sleeping @thism  time in an upward position of 90 degree. Will continue to monitor pt for any distress or pain. Will continue to monitor for safety

## 2020-06-23 NOTE — ED Notes (Signed)
Pt resting on pull out with eyes closed unlabored respirations intermittantly through night in no acute distress. Safety maintained.

## 2020-07-14 ENCOUNTER — Emergency Department (HOSPITAL_COMMUNITY)
Admission: EM | Admit: 2020-07-14 | Discharge: 2020-07-15 | Disposition: A | Discharge status: Home / Self Care | Payer: Medicaid Other | Attending: Emergency Medicine | Admitting: Emergency Medicine

## 2020-07-14 ENCOUNTER — Emergency Department (HOSPITAL_COMMUNITY): Payer: Medicaid Other

## 2020-07-14 ENCOUNTER — Encounter (HOSPITAL_COMMUNITY): Payer: Self-pay | Admitting: Emergency Medicine

## 2020-07-14 ENCOUNTER — Other Ambulatory Visit: Payer: Self-pay

## 2020-07-14 DIAGNOSIS — Z79899 Other long term (current) drug therapy: Secondary | ICD-10-CM | POA: Diagnosis not present

## 2020-07-14 DIAGNOSIS — I10 Essential (primary) hypertension: Secondary | ICD-10-CM | POA: Diagnosis not present

## 2020-07-14 DIAGNOSIS — F17210 Nicotine dependence, cigarettes, uncomplicated: Secondary | ICD-10-CM | POA: Insufficient documentation

## 2020-07-14 DIAGNOSIS — R21 Rash and other nonspecific skin eruption: Secondary | ICD-10-CM | POA: Diagnosis not present

## 2020-07-14 DIAGNOSIS — R079 Chest pain, unspecified: Secondary | ICD-10-CM | POA: Insufficient documentation

## 2020-07-14 LAB — BASIC METABOLIC PANEL
Anion gap: 10 (ref 5–15)
BUN: 11 mg/dL (ref 6–20)
CO2: 31 mmol/L (ref 22–32)
Calcium: 9.5 mg/dL (ref 8.9–10.3)
Chloride: 98 mmol/L (ref 98–111)
Creatinine, Ser: 0.87 mg/dL (ref 0.61–1.24)
GFR, Estimated: >60 mL/min (ref >60)
Glucose, Bld: 94 mg/dL (ref 70–99)
Potassium: 3.9 mmol/L (ref 3.5–5.1)
Sodium: 139 mmol/L (ref 135–145)

## 2020-07-14 LAB — CBC
HCT: 38.8 % — ABNORMAL LOW (ref 39.0–52.0)
Hemoglobin: 11.6 g/dL — ABNORMAL LOW (ref 13.0–17.0)
MCH: 28.4 pg (ref 26.0–34.0)
MCHC: 29.9 g/dL — ABNORMAL LOW (ref 30.0–36.0)
MCV: 94.9 fL (ref 80.0–100.0)
Platelets: 227 10*3/uL (ref 150–400)
RBC: 4.09 MIL/uL — ABNORMAL LOW (ref 4.22–5.81)
RDW: 15.2 % (ref 11.5–15.5)
WBC: 6.9 10*3/uL (ref 4.0–10.5)
nRBC: 0 % (ref 0.0–0.2)

## 2020-07-14 LAB — PROTIME-INR
INR: 1.1 (ref 0.8–1.2)
Prothrombin Time: 13.8 seconds (ref 11.4–15.2)

## 2020-07-14 LAB — TROPONIN I (HIGH SENSITIVITY): Troponin I (High Sensitivity): 4 ng/L (ref <18)

## 2020-07-14 NOTE — ED Triage Notes (Signed)
Patient arrived with EMS from street ( homeless) reports central chest pain this evening with SOB , no emesis or diaphoresis , he received ASA 324 mg and 1 NTG sl with mild relief , denies cough or fever .

## 2020-07-15 LAB — TROPONIN I (HIGH SENSITIVITY): Troponin I (High Sensitivity): 5 ng/L (ref <18)

## 2020-07-15 MED ORDER — FUROSEMIDE 20 MG PO TABS: 20.0000 mg | ORAL_TABLET | Freq: Every day | ORAL | 0 refills | Status: AC

## 2020-07-15 MED ORDER — PANTOPRAZOLE SODIUM 40 MG PO TBEC: 40.0000 mg | DELAYED_RELEASE_TABLET | Freq: Every day | ORAL | 0 refills | Status: AC

## 2020-07-15 MED ORDER — NYSTATIN 100000 UNIT/GM EX CREA
TOPICAL_CREAM | Freq: Two times a day (BID) | CUTANEOUS | Status: DC
  Administered 2020-07-15: 1 via TOPICAL
  Filled 2020-07-15: qty 15

## 2020-07-15 NOTE — ED Provider Notes (Signed)
[facility] EMERGENCY DEPARTMENT Provider Note   CSN: [id number] Arrival date & time: 07/14/20  2210     History Chief Complaint  Patient presents with  . Chest Pain    Bruce [name] is a 44 y.o. male.  44 year old male who is here quite often the presents for Nonspecific chest pain radiates from his esophagus to his navel which is now improved.  Did not take any for his pain.  Has had this well times in the past without diagnosis.  He also complains of a rash to his head after getting his haircut.  No sunburn.  No trauma.  No fever or cough.  No malaise, nausea, vomiting, lightheadedness or other associated symptoms.   Chest Pain      Past Medical History:  Diagnosis Date  . Depression   . ETOH abuse   . Hypertension   . Obesity   . Schizophrenia (HCC)   . Substance abuse [facility])     Patient Active Problem List   Diagnosis Date Noted  . MDD (major depressive disorder) 07/12/2019  . Malingering 06/12/2018  . Tobacco use disorder 06/09/2018  . HTN (hypertension) 06/09/2018  . Schizoaffective disorder, depressive type (HCC) 08/04/2017  . 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 Polysubstance abuse (HCC) 02/27/2017  . Substance induced mood disorder (HCC) 02/27/2017  . Cocaine use disorder, severe, dependence (HCC) 01/05/2017  . 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 Cocaine use disorder, mild, abuse (HCC) 05/03/2016  . Cannabis use disorder, mild, abuse 05/03/2016  . Suicidal ideations 04/30/2016  . Hyperprolactinemia (HCC) 09/24/2015  . Alcohol use disorder, moderate, dependence (HCC) 09/22/2015  . Morbid obesity (HCC) 09/22/2015    History reviewed. No pertinent surgical history.     Family History  Problem Relation Age of Onset  . Schizophrenia Mother     Social History   Tobacco Use  . Smoking status: Current Every Day Smoker    Packs/day: 0.50    Types: Cigarettes  . Smokeless tobacco: Never Used    Substance Use Topics  . Alcohol use: Yes    Comment: Occasional, 2 nights per week  . Drug use: Not Currently    Types: Marijuana, "Crack" cocaine, Cocaine    Comment: +Cocaine    Home Medications Prior to Admission medications   Medication Sig Start Date End Date Taking? Authorizing Provider  acetaminophen (TYLENOL) 500 MG tablet Take 1,000 mg by mouth every 8 (eight) hours as needed for moderate pain.   Yes [provider]  ARIPiprazole (ABILIFY) 10 MG tablet Take 10 mg by mouth daily.   Yes [provider]  gabapentin (NEURONTIN) 300 MG capsule Take 300 mg by mouth 2 (two) times daily.   Yes [provider]  losartan (COZAAR) 25 MG tablet Take 1 tablet (25 mg total) by mouth daily. 06/15/20  Yes Rolan Bucco, MD  furosemide (LASIX) 20 MG tablet Take 1 tablet (20 mg total) by mouth daily. 07/15/20   Caela Huot, Barbara Cower, MD  pantoprazole (PROTONIX) 40 MG tablet Take 1 tablet (40 mg total) by mouth daily. 07/15/20   Dominque Levandowski, Barbara Cower, MD    Allergies    Patient has no known allergies.  Review of Systems   Review of Systems  Cardiovascular: Positive for chest pain.  All other systems reviewed and are negative.   Physical Exam Updated Vital Signs BP 134/81 (BP Location: Right Arm)   Pulse 81   Temp 98.1 F (36.7 C) (Oral)   Resp 16   Ht  5\' 8"  (1.727 m)   Wt (!) 220 kg   SpO2 98%   BMI 73.75 kg/m   Physical Exam Vitals and nursing note reviewed.  Constitutional:      Appearance: He is well-developed.  HENT:     Head: Normocephalic and atraumatic.     Nose: Nose normal.     Mouth/Throat:     Mouth: Mucous membranes are moist.     Pharynx: Oropharynx is clear.  Eyes:     Pupils: Pupils are equal, round, and reactive to light.  Cardiovascular:     Rate and Rhythm: Normal rate.  Pulmonary:     Effort: Pulmonary effort is normal. No respiratory distress.  Abdominal:     General: There is no distension.  Musculoskeletal:        General: Normal  range of motion.     Cervical back: Normal range of motion.  Skin:    General: Skin is warm and dry.     Comments: Mild erythematous, peeling rash to scalp. Not on mucous membranes. No nikolsky sign.  Neurological:     General: No focal deficit present.     Mental Status: He is alert.     ED Results / Procedures / Treatments   Labs (all labs ordered are listed, but only abnormal results are displayed) Labs Reviewed  CBC - Abnormal; Notable for the following components:      Result Value   RBC 4.09 (*)    Hemoglobin 11.6 (*)    HCT 38.8 (*)    MCHC 29.9 (*)    All other components within normal limits  BASIC METABOLIC PANEL  PROTIME-INR  TROPONIN I (HIGH SENSITIVITY)  TROPONIN I (HIGH SENSITIVITY)    EKG None  Radiology DG Chest 2 View  Result Date: 07/14/2020 CLINICAL DATA:  Chest pain EXAM: CHEST - 2 VIEW COMPARISON:  06/15/2020 FINDINGS: The heart size and mediastinal contours are within normal limits. Both lungs are clear. The visualized skeletal structures are unremarkable. IMPRESSION: No active cardiopulmonary disease. Electronically Signed   By: 06/17/2020 M.D.   On: 07/14/2020 22:34    Procedures Procedures (including critical care time)  Medications Ordered in ED Medications  nystatin cream (MYCOSTATIN) (1 application Topical Given 07/15/20 0424)    ED Course  I have reviewed the triage vital signs and the nursing notes.  Pertinent labs & imaging results that were available during my care of the patient were reviewed by me and considered in my medical decision making (see chart for details).    MDM Rules/Calculators/A&P                          Negative troponins normal EKG nonspecific chest pain likely GI related.  No indication for further work-up as he has been worked up multiple times in the past for ACS.  Rash could very well be a fungal rash however less likely tinea capitis.  Will treat with nystatin cream for now follow with PCP if not  improving for further management.  Final Clinical Impression(s) / ED Diagnoses Final diagnoses:  Nonspecific chest pain  Rash    Rx / DC Orders ED Discharge Orders         Ordered    pantoprazole (PROTONIX) 40 MG tablet  Daily        07/15/20 0328    furosemide (LASIX) 20 MG tablet  Daily        07/15/20 0328  Marily Memos, MD 07/15/20 [phone]

## 2020-07-21 ENCOUNTER — Other Ambulatory Visit: Payer: Self-pay

## 2020-07-21 ENCOUNTER — Encounter (HOSPITAL_COMMUNITY): Payer: Self-pay | Admitting: Emergency Medicine

## 2020-07-21 ENCOUNTER — Emergency Department (HOSPITAL_COMMUNITY)
Admission: EM | Admit: 2020-07-21 | Discharge: 2020-07-22 | Disposition: A | Discharge status: Home / Self Care | Payer: Medicaid Other | Attending: Emergency Medicine | Admitting: Emergency Medicine

## 2020-07-21 DIAGNOSIS — Z20822 Contact with and (suspected) exposure to covid-19: Secondary | ICD-10-CM | POA: Insufficient documentation

## 2020-07-21 DIAGNOSIS — I10 Essential (primary) hypertension: Secondary | ICD-10-CM | POA: Diagnosis not present

## 2020-07-21 DIAGNOSIS — F17210 Nicotine dependence, cigarettes, uncomplicated: Secondary | ICD-10-CM | POA: Insufficient documentation

## 2020-07-21 DIAGNOSIS — J069 Acute upper respiratory infection, unspecified: Secondary | ICD-10-CM

## 2020-07-21 DIAGNOSIS — Z79899 Other long term (current) drug therapy: Secondary | ICD-10-CM | POA: Diagnosis not present

## 2020-07-21 DIAGNOSIS — R059 Cough, unspecified: Secondary | ICD-10-CM | POA: Diagnosis present

## 2020-07-21 LAB — CBC WITH DIFFERENTIAL/PLATELET
Abs Immature Granulocytes: 0.03 10*3/uL (ref 0.00–0.07)
Basophils Absolute: 0 10*3/uL (ref 0.0–0.1)
Basophils Relative: 0 %
Eosinophils Absolute: 0.2 10*3/uL (ref 0.0–0.5)
Eosinophils Relative: 3 %
HCT: 36.4 % — ABNORMAL LOW (ref 39.0–52.0)
Hemoglobin: 10.8 g/dL — ABNORMAL LOW (ref 13.0–17.0)
Immature Granulocytes: 0 %
Lymphocytes Relative: 21 %
Lymphs Abs: 1.7 10*3/uL (ref 0.7–4.0)
MCH: 28.6 pg (ref 26.0–34.0)
MCHC: 29.7 g/dL — ABNORMAL LOW (ref 30.0–36.0)
MCV: 96.6 fL (ref 80.0–100.0)
Monocytes Absolute: 0.4 10*3/uL (ref 0.1–1.0)
Monocytes Relative: 4 %
Neutro Abs: 6 10*3/uL (ref 1.7–7.7)
Neutrophils Relative %: 72 %
Platelets: 192 10*3/uL (ref 150–400)
RBC: 3.77 MIL/uL — ABNORMAL LOW (ref 4.22–5.81)
RDW: 15.9 % — ABNORMAL HIGH (ref 11.5–15.5)
WBC: 8.3 10*3/uL (ref 4.0–10.5)
nRBC: 0 % (ref 0.0–0.2)

## 2020-07-21 LAB — RESP PANEL BY RT-PCR (FLU A&B, COVID) ARPGX2
Influenza A by PCR: NEGATIVE
Influenza B by PCR: NEGATIVE
SARS Coronavirus 2 by RT PCR: NEGATIVE

## 2020-07-21 LAB — COMPREHENSIVE METABOLIC PANEL
ALT: 20 U/L (ref 0–44)
AST: 19 U/L (ref 15–41)
Albumin: 3.2 g/dL — ABNORMAL LOW (ref 3.5–5.0)
Alkaline Phosphatase: 63 U/L (ref 38–126)
Anion gap: 9 (ref 5–15)
BUN: 13 mg/dL (ref 6–20)
CO2: 31 mmol/L (ref 22–32)
Calcium: 9 mg/dL (ref 8.9–10.3)
Chloride: 101 mmol/L (ref 98–111)
Creatinine, Ser: 0.93 mg/dL (ref 0.61–1.24)
GFR, Estimated: >60 mL/min (ref >60)
Glucose, Bld: 111 mg/dL — ABNORMAL HIGH (ref 70–99)
Potassium: 3.8 mmol/L (ref 3.5–5.1)
Sodium: 141 mmol/L (ref 135–145)
Total Bilirubin: 0.6 mg/dL (ref 0.3–1.2)
Total Protein: 7.3 g/dL (ref 6.5–8.1)

## 2020-07-21 NOTE — ED Triage Notes (Signed)
Pt brought to ED by [facility] from home for Covid like symptoms, lost of taste, nausea, vomiting and diarrhea for the past 2 weeks, pt is fully vaccinated. BP 140/85, HR 100, R 20, SPO2 96% RA. CBG 120

## 2020-07-22 ENCOUNTER — Emergency Department (HOSPITAL_COMMUNITY): Payer: Medicaid Other

## 2020-07-22 MED ORDER — BENZONATATE 100 MG PO CAPS: 100.0000 mg | ORAL_CAPSULE | Freq: Three times a day (TID) | ORAL | 0 refills | Status: AC

## 2020-07-22 MED ORDER — BENZONATATE 100 MG PO CAPS
200.0000 mg | ORAL_CAPSULE | Freq: Once | ORAL | Status: AC
  Administered 2020-07-22: 200 mg via ORAL
  Filled 2020-07-22: qty 2

## 2020-07-22 MED ORDER — LOPERAMIDE HCL 2 MG PO CAPS: 2.0000 mg | ORAL_CAPSULE | Freq: Four times a day (QID) | ORAL | 0 refills | Status: AC | PRN

## 2020-07-22 MED ORDER — LOPERAMIDE HCL 2 MG PO CAPS
4.0000 mg | ORAL_CAPSULE | Freq: Once | ORAL | Status: AC
  Administered 2020-07-22: 4 mg via ORAL
  Filled 2020-07-22: qty 2

## 2020-07-22 NOTE — Progress Notes (Signed)
CSW provided NT with bus pass for patient.  Edwin Dada, MSW, LCSW-A Transitions of Care  Clinical Social Worker I [facility] Emergency Departments  Medical ICU [phone]

## 2020-07-22 NOTE — Discharge Instructions (Signed)
With a primary care doctor if not better in a week.  Take the medications to help with the cough and the diarrhea

## 2020-07-22 NOTE — ED Provider Notes (Signed)
[facility] EMERGENCY DEPARTMENT Provider Note   CSN: [id number] Arrival date & time: 07/21/20  2027     History Chief Complaint  Patient presents with  . covid symptoms    Bruce [name] is a 44 y.o. male.  HPI   Patient presents to the ED for evaluation of cough, diarrhea, congestion.  Patient states his symptoms started over the last couple of weeks.  He has had some episodes of nausea vomiting as well as diarrhea.  Patient has not had any vomiting in the last 24 hours.  He has been having episodes of diarrhea.  Patient also has had a dry cough.  He does not know if he had any fevers.  He is not feeling short of breath.  Patient has tried some Pepto-Bismol without relief.  He has not seen one or taken any other medications.  Past Medical History:  Diagnosis Date  . Depression   . ETOH abuse   . Hypertension   . Obesity   . Schizophrenia (HCC)   . Substance abuse [facility])     Patient Active Problem List   Diagnosis Date Noted  . MDD (major depressive disorder) 07/12/2019  . Malingering 06/12/2018  . Tobacco use disorder 06/09/2018  . HTN (hypertension) 06/09/2018  . Schizoaffective disorder, depressive type (HCC) 08/04/2017  . 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 Polysubstance abuse (HCC) 02/27/2017  . Substance induced mood disorder (HCC) 02/27/2017  . Cocaine use disorder, severe, dependence (HCC) 01/05/2017  . 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 Cocaine use disorder, mild, abuse (HCC) 05/03/2016  . Cannabis use disorder, mild, abuse 05/03/2016  . Suicidal ideations 04/30/2016  . Hyperprolactinemia (HCC) 09/24/2015  . Alcohol use disorder, moderate, dependence (HCC) 09/22/2015  . Morbid obesity (HCC) 09/22/2015    History reviewed. No pertinent surgical history.     Family History  Problem Relation Age of Onset  . Schizophrenia Mother     Social History   Tobacco Use  . Smoking status: Current Every Day  Smoker    Packs/day: 0.50    Types: Cigarettes  . Smokeless tobacco: Never Used  Substance Use Topics  . Alcohol use: Yes    Comment: Occasional, 2 nights per week  . Drug use: Not Currently    Types: Marijuana, "Crack" cocaine, Cocaine    Comment: +Cocaine    Home Medications Prior to Admission medications   Medication Sig Start Date End Date Taking? Authorizing Provider  acetaminophen (TYLENOL) 500 MG tablet Take 1,000 mg by mouth every 8 (eight) hours as needed for moderate pain.    [provider]  ARIPiprazole (ABILIFY) 10 MG tablet Take 10 mg by mouth daily.    [provider]  benzonatate (TESSALON) 100 MG capsule Take 1 capsule (100 mg total) by mouth every 8 (eight) hours. 07/22/20   Linwood Dibbles, MD  furosemide (LASIX) 20 MG tablet Take 1 tablet (20 mg total) by mouth daily. 07/15/20   Mesner, Barbara Cower, MD  gabapentin (NEURONTIN) 300 MG capsule Take 300 mg by mouth 2 (two) times daily.    [provider]  loperamide (IMODIUM) 2 MG capsule Take 1 capsule (2 mg total) by mouth 4 (four) times daily as needed for diarrhea or loose stools. 07/22/20   Linwood Dibbles, MD  losartan (COZAAR) 25 MG tablet Take 1 tablet (25 mg total) by mouth daily. 06/15/20   Rolan Bucco, MD  pantoprazole (PROTONIX) 40 MG tablet Take 1 tablet (40 mg total) by mouth daily. 07/15/20  Mesner, Barbara Cower, MD    Allergies    Patient has no known allergies.  Review of Systems   Review of Systems  All other systems reviewed and are negative.   Physical Exam Updated Vital Signs BP 118/81 (BP Location: Right Arm)   Pulse 68   Temp 99 F (37.2 C) (Oral)   Resp 20   Ht 1.727 m (5\' 8" )   Wt (!) 220 kg   SpO2 95%   BMI 73.75 kg/m   Physical Exam Vitals and nursing note reviewed.  Constitutional:      General: He is not in acute distress.    Appearance: He is well-developed.  HENT:     Head: Normocephalic and atraumatic.     Right Ear: External ear normal.     Left Ear:  External ear normal.  Eyes:     General: No scleral icterus.       Right eye: No discharge.        Left eye: No discharge.     Conjunctiva/sclera: Conjunctivae normal.  Neck:     Trachea: No tracheal deviation.  Cardiovascular:     Rate and Rhythm: Normal rate and regular rhythm.  Pulmonary:     Effort: Pulmonary effort is normal. No respiratory distress.     Breath sounds: Normal breath sounds. No stridor. No wheezing or rales.     Comments: Dry cough noted Abdominal:     General: Bowel sounds are normal. There is no distension.     Palpations: Abdomen is soft.     Tenderness: There is no abdominal tenderness. There is no guarding or rebound.  Musculoskeletal:        General: No tenderness.     Cervical back: Neck supple.     Right lower leg: Edema present.     Left lower leg: Edema present.  Skin:    General: Skin is warm and dry.     Findings: No rash.  Neurological:     Mental Status: He is alert.     Cranial Nerves: No cranial nerve deficit (no facial droop, extraocular movements intact, no slurred speech).     Sensory: No sensory deficit.     Motor: No abnormal muscle tone or seizure activity.     Coordination: Coordination normal.     ED Results / Procedures / Treatments   Labs (all labs ordered are listed, but only abnormal results are displayed) Labs Reviewed  CBC WITH DIFFERENTIAL/PLATELET - Abnormal; Notable for the following components:      Result Value   RBC 3.77 (*)    Hemoglobin 10.8 (*)    HCT 36.4 (*)    MCHC 29.7 (*)    RDW 15.9 (*)    All other components within normal limits  COMPREHENSIVE METABOLIC PANEL - Abnormal; Notable for the following components:   Glucose, Bld 111 (*)    Albumin 3.2 (*)    All other components within normal limits  RESP PANEL BY RT-PCR (FLU A&B, COVID) ARPGX2    EKG None  Radiology DG Chest 2 View  Result Date: 07/22/2020 CLINICAL DATA:  Chest pain and cough for 3 days EXAM: CHEST - 2 VIEW COMPARISON:   07/14/2020 FINDINGS: Hilar/vascular prominence in the setting of low lung volumes. Normal heart size. There is no edema, consolidation, effusion, or pneumothorax. Degenerative endplate spurring. IMPRESSION: Stable exam.  No acute finding. Electronically Signed   By: 07/16/2020 M.D.   On: 07/22/2020 09:10    Procedures Procedures (including  critical care time)  Medications Ordered in ED Medications  benzonatate (TESSALON) capsule 200 mg (has no administration in time range)  loperamide (IMODIUM) capsule 4 mg (has no administration in time range)    ED Course  I have reviewed the triage vital signs and the nursing notes.  Pertinent labs & imaging results that were available during my care of the patient were reviewed by me and considered in my medical decision making (see chart for details).  Clinical Course as of Jul 22 926  Tue Jul 22, 2020  5035 Labs reviewed.  Chest x-ray negative.  Covid test is negative.  CBC metabolic panel unremarkable   [JK]    Clinical Course User Index [JK] Linwood Dibbles, MD   MDM Rules/Calculators/A&P                         Symptoms are suggestive of infectious etiology.  Covid considered.  Pneumonia also a consideration.  Patient does have peripheral edema but doubt CHF as he is having cough nasal congestion diarrhea.  Laboratory tests are unremarkable.  Mild anemia but appears stable.  Electrolyte unremarkable.  Covid and flu test are negative.  We will add on chest x-ray.  Tessalon and Imodium ordered.   CXR negative.  Likely viral uri.  Will dc home, supportive care. Final Clinical Impression(s) / ED Diagnoses Final diagnoses:  Upper respiratory tract infection, unspecified type    Rx / DC Orders ED Discharge Orders         Ordered    benzonatate (TESSALON) 100 MG capsule  Every 8 hours        07/22/20 0926    loperamide (IMODIUM) 2 MG capsule  4 times daily PRN        07/22/20 0926           Linwood Dibbles, MD 07/22/20 [phone]

## 2020-08-23 DEATH — deceased

## 2022-06-03 IMAGING — DX DG CHEST 2V
2 series · 2 of 2 positions shown · non-contrast
Comparison: 05/30/2020

CLINICAL DATA: Chest pain

EXAM:
CHEST - 2 VIEW

[chest pa]
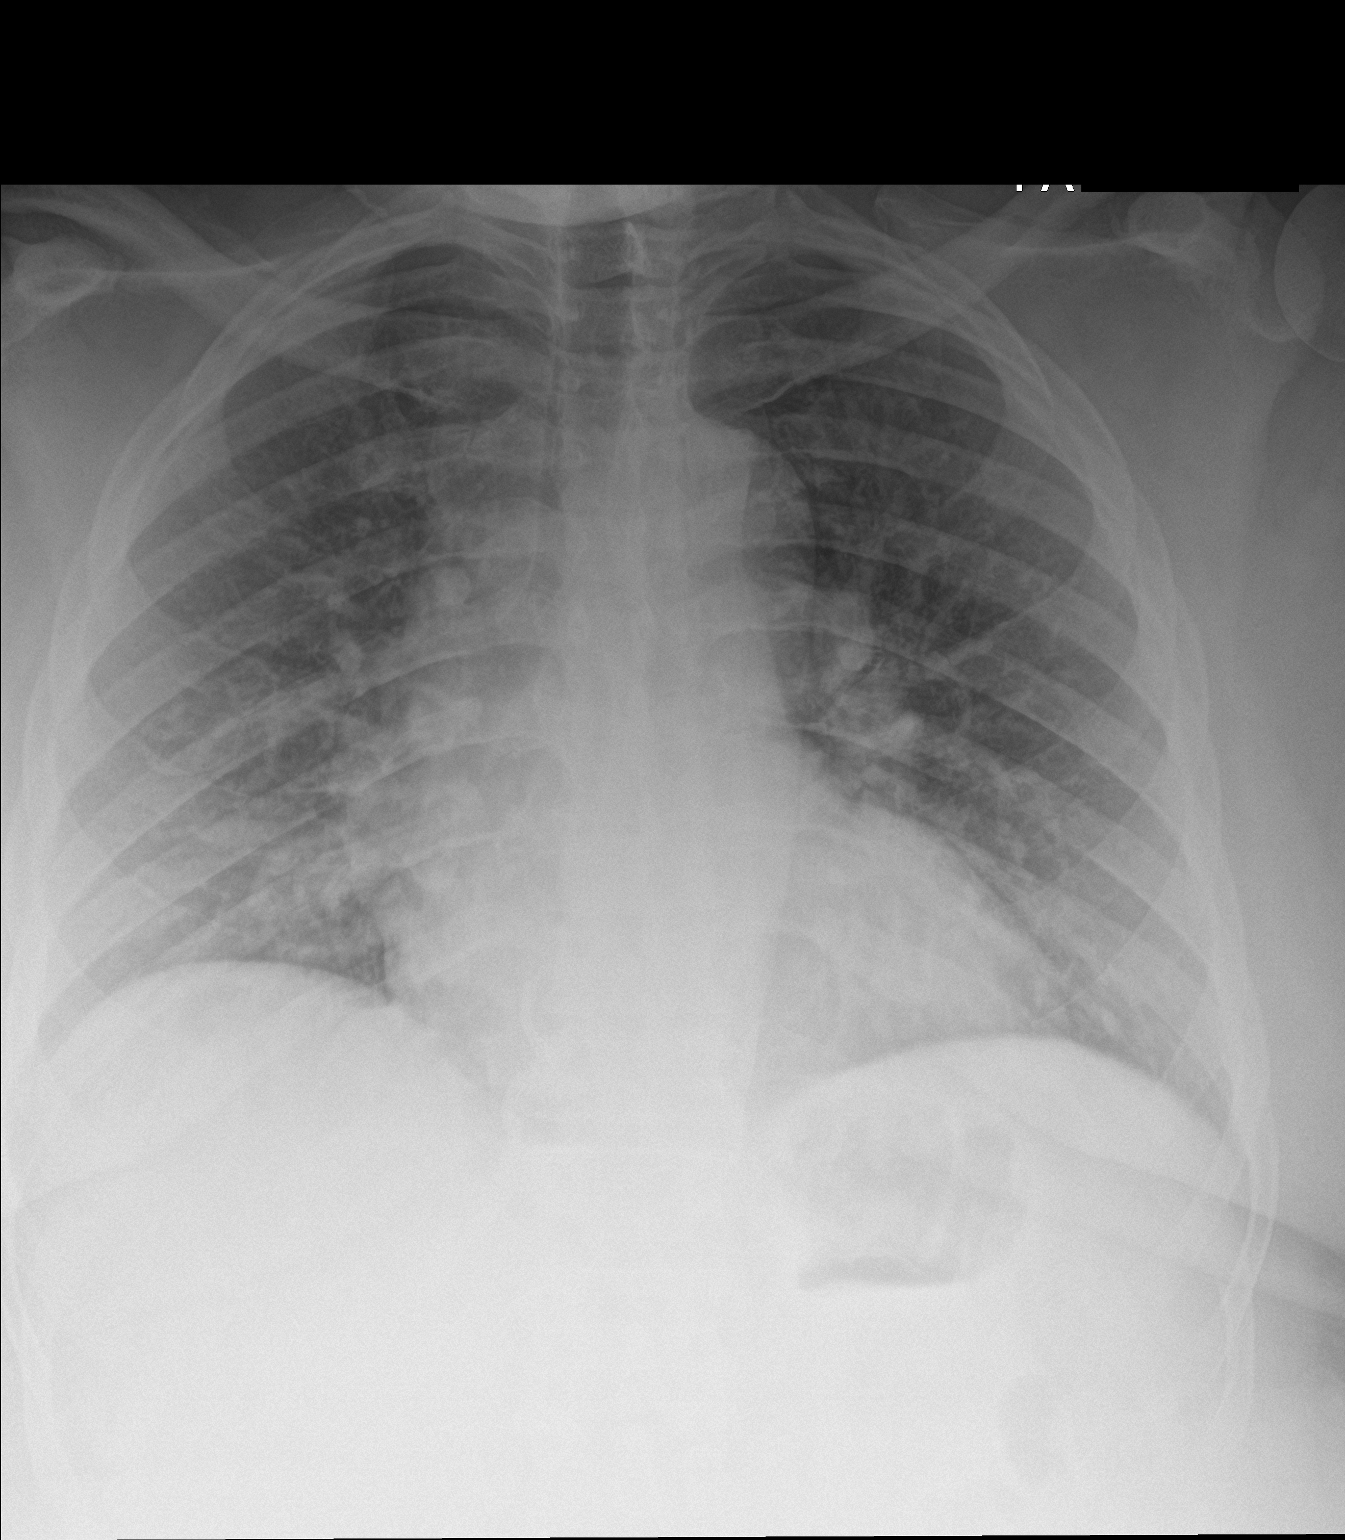

[chest lat]
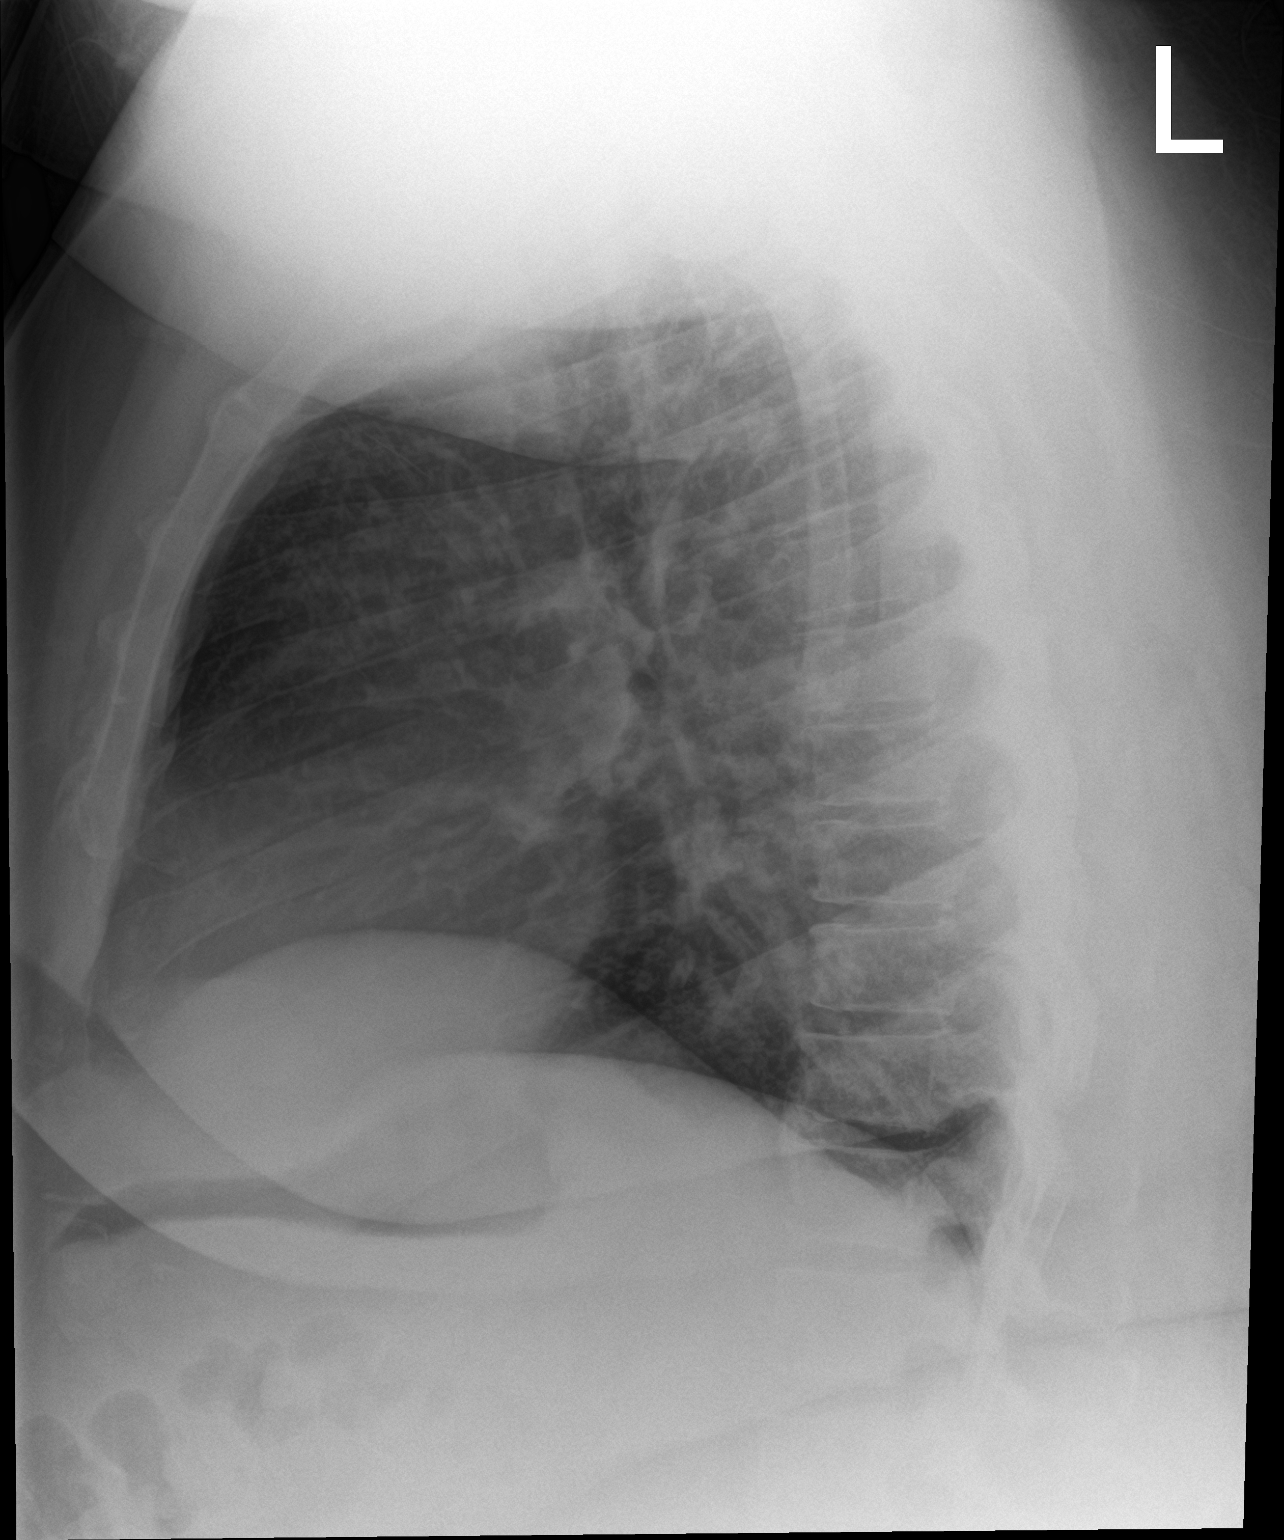

[2 of 2 positions shown; findings below may reference images not displayed]

FINDINGS: Mild cardiac enlargement with mild vascular congestion. No edema or
consolidation. No pleural effusion. No pneumothorax. Mediastinal
contours appear intact.
IMPRESSION: Mild cardiac enlargement and vascular congestion.
